# Patient Record
Sex: Female | Born: 1935 | Race: White | Hispanic: No | Smoking: Former smoker
Health system: Southern US, Community
[De-identification: ages and names within clinical notes are randomized; demographics above are authoritative.]

## PROBLEM LIST (undated history)

## (undated) DIAGNOSIS — M1990 Unspecified osteoarthritis, unspecified site: Secondary | ICD-10-CM

## (undated) DIAGNOSIS — I5032 Chronic diastolic (congestive) heart failure: Secondary | ICD-10-CM

## (undated) DIAGNOSIS — E538 Deficiency of other specified B group vitamins: Secondary | ICD-10-CM

## (undated) DIAGNOSIS — J8410 Pulmonary fibrosis, unspecified: Secondary | ICD-10-CM

## (undated) DIAGNOSIS — E0590 Thyrotoxicosis, unspecified without thyrotoxic crisis or storm: Secondary | ICD-10-CM

## (undated) DIAGNOSIS — Z8639 Personal history of other endocrine, nutritional and metabolic disease: Secondary | ICD-10-CM

## (undated) DIAGNOSIS — I2510 Atherosclerotic heart disease of native coronary artery without angina pectoris: Secondary | ICD-10-CM

## (undated) DIAGNOSIS — I10 Essential (primary) hypertension: Secondary | ICD-10-CM

## (undated) DIAGNOSIS — C4491 Basal cell carcinoma of skin, unspecified: Secondary | ICD-10-CM

## (undated) DIAGNOSIS — K635 Polyp of colon: Secondary | ICD-10-CM

## (undated) DIAGNOSIS — I440 Atrioventricular block, first degree: Secondary | ICD-10-CM

## (undated) DIAGNOSIS — J449 Chronic obstructive pulmonary disease, unspecified: Secondary | ICD-10-CM

## (undated) DIAGNOSIS — M8580 Other specified disorders of bone density and structure, unspecified site: Secondary | ICD-10-CM

## (undated) DIAGNOSIS — R0600 Dyspnea, unspecified: Secondary | ICD-10-CM

## (undated) DIAGNOSIS — E785 Hyperlipidemia, unspecified: Secondary | ICD-10-CM

## (undated) DIAGNOSIS — N904 Leukoplakia of vulva: Secondary | ICD-10-CM

## (undated) DIAGNOSIS — I739 Peripheral vascular disease, unspecified: Secondary | ICD-10-CM

## (undated) DIAGNOSIS — I6529 Occlusion and stenosis of unspecified carotid artery: Secondary | ICD-10-CM

## (undated) HISTORY — DX: Atrioventricular block, first degree: I44.0

## (undated) HISTORY — DX: Peripheral vascular disease, unspecified: I73.9

## (undated) HISTORY — DX: Hyperlipidemia, unspecified: E78.5

## (undated) HISTORY — PX: ENDARTERECTOMY: SHX5162

## (undated) HISTORY — DX: Polyp of colon: K63.5

## (undated) HISTORY — DX: Essential (primary) hypertension: I10

## (undated) HISTORY — DX: Thyrotoxicosis, unspecified without thyrotoxic crisis or storm: E05.90

## (undated) HISTORY — DX: Occlusion and stenosis of unspecified carotid artery: I65.29

## (undated) HISTORY — PX: CATARACT EXTRACTION W/ INTRAOCULAR LENS  IMPLANT, BILATERAL: SHX1307

## (undated) HISTORY — DX: Personal history of other endocrine, nutritional and metabolic disease: Z86.39

## (undated) HISTORY — DX: Other specified disorders of bone density and structure, unspecified site: M85.80

## (undated) HISTORY — PX: REPAIR TENDONS FOOT: SUR1209

## (undated) HISTORY — DX: Leukoplakia of vulva: N90.4

## (undated) HISTORY — DX: Atherosclerotic heart disease of native coronary artery without angina pectoris: I25.10

## (undated) HISTORY — DX: Basal cell carcinoma of skin, unspecified: C44.91

## (undated) HISTORY — DX: Deficiency of other specified B group vitamins: E53.8

## (undated) HISTORY — PX: KNEE ARTHROSCOPY: SUR90

## (undated) HISTORY — DX: Chronic diastolic (congestive) heart failure: I50.32

## (undated) HISTORY — DX: Chronic obstructive pulmonary disease, unspecified: J44.9

---

## 1997-11-18 HISTORY — PX: CHOLECYSTECTOMY: SHX55

## 1998-02-18 ENCOUNTER — Encounter (HOSPITAL_COMMUNITY): Admission: RE | Admit: 1998-02-18 | Discharge: 1998-05-19 | Payer: Self-pay | Admitting: Cardiology

## 1998-11-28 ENCOUNTER — Emergency Department (HOSPITAL_COMMUNITY): Admission: EM | Admit: 1998-11-28 | Discharge: 1998-11-28 | Payer: Self-pay | Admitting: *Deleted

## 1998-11-28 ENCOUNTER — Ambulatory Visit (HOSPITAL_COMMUNITY): Admission: RE | Admit: 1998-11-28 | Discharge: 1998-11-28 | Payer: Self-pay | Admitting: *Deleted

## 1998-11-29 ENCOUNTER — Encounter: Payer: Self-pay | Admitting: Emergency Medicine

## 1998-12-01 ENCOUNTER — Other Ambulatory Visit: Admission: RE | Admit: 1998-12-01 | Discharge: 1998-12-01 | Payer: Self-pay | Admitting: *Deleted

## 1999-01-03 ENCOUNTER — Observation Stay (HOSPITAL_COMMUNITY): Admission: RE | Admit: 1999-01-03 | Discharge: 1999-01-04 | Payer: Self-pay | Admitting: *Deleted

## 1999-04-23 ENCOUNTER — Ambulatory Visit (HOSPITAL_COMMUNITY): Admission: RE | Admit: 1999-04-23 | Discharge: 1999-04-23 | Payer: Self-pay | Admitting: Gastroenterology

## 1999-11-30 ENCOUNTER — Ambulatory Visit (HOSPITAL_COMMUNITY): Admission: RE | Admit: 1999-11-30 | Discharge: 1999-11-30 | Payer: Self-pay | Admitting: *Deleted

## 1999-11-30 ENCOUNTER — Encounter: Payer: Self-pay | Admitting: *Deleted

## 2000-01-04 ENCOUNTER — Encounter: Payer: Self-pay | Admitting: Emergency Medicine

## 2000-01-04 ENCOUNTER — Emergency Department (HOSPITAL_COMMUNITY): Admission: EM | Admit: 2000-01-04 | Discharge: 2000-01-04 | Payer: Self-pay | Admitting: Emergency Medicine

## 2000-01-17 ENCOUNTER — Other Ambulatory Visit: Admission: RE | Admit: 2000-01-17 | Discharge: 2000-01-17 | Payer: Self-pay | Admitting: *Deleted

## 2000-11-18 HISTORY — PX: CAROTID ENDARTERECTOMY: SUR193

## 2000-12-01 ENCOUNTER — Encounter: Payer: Self-pay | Admitting: *Deleted

## 2000-12-01 ENCOUNTER — Ambulatory Visit (HOSPITAL_COMMUNITY): Admission: RE | Admit: 2000-12-01 | Discharge: 2000-12-01 | Payer: Self-pay | Admitting: *Deleted

## 2001-01-26 ENCOUNTER — Other Ambulatory Visit: Admission: RE | Admit: 2001-01-26 | Discharge: 2001-01-26 | Payer: Self-pay | Admitting: *Deleted

## 2001-03-16 ENCOUNTER — Inpatient Hospital Stay (HOSPITAL_COMMUNITY): Admission: EM | Admit: 2001-03-16 | Discharge: 2001-03-20 | Payer: Self-pay | Admitting: Emergency Medicine

## 2001-03-16 ENCOUNTER — Encounter: Payer: Self-pay | Admitting: Internal Medicine

## 2001-03-18 ENCOUNTER — Encounter: Payer: Self-pay | Admitting: Internal Medicine

## 2001-03-19 ENCOUNTER — Encounter: Payer: Self-pay | Admitting: Internal Medicine

## 2001-04-08 ENCOUNTER — Inpatient Hospital Stay (HOSPITAL_COMMUNITY): Admission: RE | Admit: 2001-04-08 | Discharge: 2001-04-09 | Payer: Self-pay | Admitting: *Deleted

## 2001-04-08 ENCOUNTER — Encounter (INDEPENDENT_AMBULATORY_CARE_PROVIDER_SITE_OTHER): Payer: Self-pay | Admitting: Specialist

## 2001-12-02 ENCOUNTER — Encounter: Payer: Self-pay | Admitting: *Deleted

## 2001-12-02 ENCOUNTER — Ambulatory Visit (HOSPITAL_COMMUNITY): Admission: RE | Admit: 2001-12-02 | Discharge: 2001-12-02 | Payer: Self-pay | Admitting: *Deleted

## 2002-04-28 ENCOUNTER — Encounter: Payer: Self-pay | Admitting: Emergency Medicine

## 2002-04-29 ENCOUNTER — Inpatient Hospital Stay (HOSPITAL_COMMUNITY): Admission: EM | Admit: 2002-04-29 | Discharge: 2002-04-29 | Payer: Self-pay | Admitting: Emergency Medicine

## 2002-06-04 ENCOUNTER — Encounter: Payer: Self-pay | Admitting: *Deleted

## 2002-06-04 ENCOUNTER — Ambulatory Visit (HOSPITAL_COMMUNITY): Admission: RE | Admit: 2002-06-04 | Discharge: 2002-06-04 | Payer: Self-pay | Admitting: *Deleted

## 2002-07-02 ENCOUNTER — Ambulatory Visit (HOSPITAL_COMMUNITY): Admission: RE | Admit: 2002-07-02 | Discharge: 2002-07-02 | Payer: Self-pay | Admitting: *Deleted

## 2002-07-02 ENCOUNTER — Encounter: Payer: Self-pay | Admitting: *Deleted

## 2002-12-03 ENCOUNTER — Encounter: Payer: Self-pay | Admitting: *Deleted

## 2002-12-03 ENCOUNTER — Ambulatory Visit (HOSPITAL_COMMUNITY): Admission: RE | Admit: 2002-12-03 | Discharge: 2002-12-03 | Payer: Self-pay | Admitting: *Deleted

## 2003-02-14 ENCOUNTER — Ambulatory Visit (HOSPITAL_COMMUNITY): Admission: RE | Admit: 2003-02-14 | Discharge: 2003-02-14 | Payer: Self-pay | Admitting: Gastroenterology

## 2003-02-14 ENCOUNTER — Encounter (INDEPENDENT_AMBULATORY_CARE_PROVIDER_SITE_OTHER): Payer: Self-pay | Admitting: Specialist

## 2003-12-21 ENCOUNTER — Ambulatory Visit (HOSPITAL_COMMUNITY): Admission: RE | Admit: 2003-12-21 | Discharge: 2003-12-21 | Payer: Self-pay | Admitting: Internal Medicine

## 2004-12-24 ENCOUNTER — Ambulatory Visit (HOSPITAL_COMMUNITY): Admission: RE | Admit: 2004-12-24 | Discharge: 2004-12-24 | Payer: Self-pay | Admitting: Internal Medicine

## 2005-09-17 ENCOUNTER — Other Ambulatory Visit: Admission: RE | Admit: 2005-09-17 | Discharge: 2005-09-17 | Payer: Self-pay | Admitting: Gynecology

## 2005-12-26 ENCOUNTER — Ambulatory Visit (HOSPITAL_COMMUNITY): Admission: RE | Admit: 2005-12-26 | Discharge: 2005-12-26 | Payer: Self-pay | Admitting: Internal Medicine

## 2007-01-07 ENCOUNTER — Ambulatory Visit (HOSPITAL_COMMUNITY): Admission: RE | Admit: 2007-01-07 | Discharge: 2007-01-07 | Payer: Self-pay | Admitting: Internal Medicine

## 2007-01-15 ENCOUNTER — Ambulatory Visit: Payer: Self-pay | Admitting: *Deleted

## 2007-10-05 ENCOUNTER — Other Ambulatory Visit: Admission: RE | Admit: 2007-10-05 | Discharge: 2007-10-05 | Payer: Self-pay | Admitting: Gynecology

## 2008-01-12 ENCOUNTER — Ambulatory Visit (HOSPITAL_COMMUNITY): Admission: RE | Admit: 2008-01-12 | Discharge: 2008-01-12 | Payer: Self-pay | Admitting: Internal Medicine

## 2008-01-28 ENCOUNTER — Ambulatory Visit: Payer: Self-pay | Admitting: *Deleted

## 2008-05-27 ENCOUNTER — Emergency Department (HOSPITAL_COMMUNITY): Admission: EM | Admit: 2008-05-27 | Discharge: 2008-05-27 | Payer: Self-pay | Admitting: Emergency Medicine

## 2008-05-31 ENCOUNTER — Inpatient Hospital Stay (HOSPITAL_COMMUNITY): Admission: AD | Admit: 2008-05-31 | Discharge: 2008-06-06 | Payer: Self-pay | Admitting: Internal Medicine

## 2008-06-03 ENCOUNTER — Encounter (INDEPENDENT_AMBULATORY_CARE_PROVIDER_SITE_OTHER): Payer: Self-pay | Admitting: Internal Medicine

## 2008-08-01 ENCOUNTER — Encounter: Payer: Self-pay | Admitting: Cardiology

## 2008-09-13 ENCOUNTER — Encounter: Admission: RE | Admit: 2008-09-13 | Discharge: 2008-09-13 | Payer: Self-pay | Admitting: Internal Medicine

## 2009-01-12 ENCOUNTER — Ambulatory Visit (HOSPITAL_COMMUNITY): Admission: RE | Admit: 2009-01-12 | Discharge: 2009-01-12 | Payer: Self-pay | Admitting: Internal Medicine

## 2009-02-22 ENCOUNTER — Ambulatory Visit: Payer: Self-pay | Admitting: *Deleted

## 2010-01-15 ENCOUNTER — Ambulatory Visit (HOSPITAL_COMMUNITY): Admission: RE | Admit: 2010-01-15 | Discharge: 2010-01-15 | Payer: Self-pay | Admitting: Internal Medicine

## 2010-02-27 ENCOUNTER — Ambulatory Visit: Payer: Self-pay | Admitting: Vascular Surgery

## 2010-07-04 ENCOUNTER — Ambulatory Visit: Payer: Self-pay | Admitting: Cardiology

## 2010-09-14 ENCOUNTER — Ambulatory Visit: Payer: Self-pay | Admitting: Vascular Surgery

## 2011-01-07 ENCOUNTER — Other Ambulatory Visit (HOSPITAL_COMMUNITY): Payer: Self-pay | Admitting: Internal Medicine

## 2011-01-07 DIAGNOSIS — Z1231 Encounter for screening mammogram for malignant neoplasm of breast: Secondary | ICD-10-CM

## 2011-01-11 ENCOUNTER — Ambulatory Visit (INDEPENDENT_AMBULATORY_CARE_PROVIDER_SITE_OTHER): Payer: Medicare Other | Admitting: Cardiology

## 2011-01-11 DIAGNOSIS — E7800 Pure hypercholesterolemia, unspecified: Secondary | ICD-10-CM

## 2011-01-11 DIAGNOSIS — I119 Hypertensive heart disease without heart failure: Secondary | ICD-10-CM

## 2011-01-11 DIAGNOSIS — I2510 Atherosclerotic heart disease of native coronary artery without angina pectoris: Secondary | ICD-10-CM

## 2011-01-22 ENCOUNTER — Ambulatory Visit (HOSPITAL_COMMUNITY)
Admission: RE | Admit: 2011-01-22 | Discharge: 2011-01-22 | Disposition: A | Discharge status: Home / Self Care | Payer: Medicare Other | Source: Ambulatory Visit | Attending: Internal Medicine | Admitting: Internal Medicine

## 2011-01-22 DIAGNOSIS — Z1231 Encounter for screening mammogram for malignant neoplasm of breast: Secondary | ICD-10-CM

## 2011-04-02 NOTE — Consult Note (Signed)
[name], [name]                ACCOUNT NO.:  [id number]   MEDICAL RECORD NO.:  [id number]          PATIENT TYPE:  INP   LOCATION:  3706                         FACILITY:  [facility]   PHYSICIAN:  Peter M. [place]il[place], M.D.  DATE OF BIRTH:  [birth year]   DATE OF CONSULTATION:  DATE OF DISCHARGE:                                 CONSULTATION   HISTORY OF PRESENT ILLNESS:  [name] is a 77 year old white female  who is well-known to me.  She presented to the emergency department on  Friday with complaints of palpitations.  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  Her rhythm in the emergency  department was okay, but the patient was noted to be hypotensive.  Over  the weekend, the patient developed a temperature up to 103 associated  with chills.  On Monday, she developed shortness of breath.  She was  admitted Tuesday with diagnosis of urinary tract infection and severe  hyponatremia.  The patient is currently having active chills.  She  continues to complain shortness of breath, palpitation, and chills.  She  has had no chest pain.  Her chest x-ray on May 31, 2008, showed no  acute change without evidence of CHF.  She had a CT of the chest last  evening, which was negative for acute process or pulmonary embolus.   THE PATIENT'S PAST MEDICAL HISTORY:  1. Coronary artery disease with remote angioplasty of the right      coronary artery x2 in 1995.  Her last Cardiolite study in May 2006,      was negative for ischemia with ejection fraction of 80%.  2. Hyperlipidemia.  3. Hypertension.  4. Peripheral vascular disease with previous subclavian stenosis and      right carotid endarterectomy.  5. Hypothyroidism.  6. Status post cholecystectomy.  7. History of PVCs.  8. Osteopenia.  9. B12 deficiency.  10.Status post cataract surgery.   CURRENT MEDICATIONS:  1. Metoprolol 50 mg b.i.d.  2. Synthroid 135 mcg per day.  3. Lovenox 40 mg subcu daily.  4. Cipro 200  mg IV b.i.d.  5. Protonix 40 mg p.o. daily.   She has no known allergies.   SOCIAL HISTORY:  The patient is a retired Geologist, engineering.  She quit  smoking over 10 years ago.  She rarely drinks alcohol.   FAMILY HISTORY:  Father died at age 73 with myocardial infarction.  Mother died at age 63 with endocarditis.   REVIEW OF SYSTEMS:  She denies any arthralgias or arthritis.  She has  had no rashes.  She denies any cough for hemoptysis.  She denies any  change in bowel or bladder habits recently.  Other review of systems are  negative.   PHYSICAL EXAMINATION:  The patient is an elderly white female who is  actively having chills.  Blood pressure is 94/48, pulse is 108.  She is  in sinus rhythm with occasional PVC.  She is afebrile.  Her HEENT exam,  she is normocephalic and atraumatic.  Her pupils are equal, round, and  reactive.  Sclerae clear.  Oropharynx is clear.  Neck is without JVD,  adenopathy, thyromegaly, or bruits.  Lungs are clear.  Cardiac  exam  reveals a regular rate and rhythm without gallop, murmur, rub, or click.  Abdomen is soft and nontender.  She has no mass or bruits.  There is no  flank tenderness to palpation.  Extremities reveal trace pedal edema.  Neurologic exam is grossly intact.   LABORATORY DATA:  ECG shows normal sinus rhythm.  There are no acute  changes.  Chest x-ray and CT are noted above.  White count 11,800,  hemoglobin 9.5, hematocrit 28.3, and platelets 125,000.  Sodium is 121,  potassium 3.8, chloride 93, CO2 19, BUN 26, creatinine 1.34, glucose  105, total bili is 1.7, albumin 2.3, TSH was 1.294, and calcium is 7.8.  Urine culture shows greater than 10,000 colonies of E coli.   IMPRESSION:  1. Escherichia coli urinary tract infection.  2. Severe hyponatremia.  3. Hypotension with volume depletion.  4. Dyspnea, possibly related to her sepsis/infection.  No evidence of      active congestive heart failure.  5. Coronary artery disease with  remote angioplasty, right coronary.  6. Peripheral vascular disease.   PLAN:  We would hold all antihypertensive at this time.  We will  increase her hydration with normal saline 200 cc/hour, also add fluid  restriction to help with her sodium level.  We will continue IV  antibiotics.  We will obtain an echocardiogram to assess her left  ventricular function and also to rule out vegetation.  If the patient  has any further fever, we would obtain blood cultures.           ______________________________  Peter M. [place], M.D.     PMJ/MEDQ  D:  06/02/2008  T:  06/03/2008  Job:  [id number]   cc:   Larina Earthly, M.D.

## 2011-04-02 NOTE — Procedures (Signed)
CAROTID DUPLEX EXAM   INDICATION:  Followup carotid endarterectomy.   HISTORY:  Diabetes:  No.  Cardiac:  CAD.  Hypertension:  Yes.  Smoking:  Previous.  Previous Surgery:  Right CEA on 03/09/2001.  CV History:  Currently asymptomatic.  Amaurosis Fugax No, Paresthesias No, Hemiparesis No                                       RIGHT             LEFT  Brachial systolic pressure:         133               115  Brachial Doppler waveforms:         Normal            Abnormal  Vertebral direction of flow:        Antegrade         Not visualized  DUPLEX VELOCITIES (cm/sec)  CCA peak systolic                   82                110  ECA peak systolic                   122               124  ICA peak systolic                   103               170  ICA end diastolic                   22                36  PLAQUE MORPHOLOGY:                  Heterogeneous     Heterogeneous  PLAQUE AMOUNT:                      Mild              Moderate  PLAQUE LOCATION:                    ICA / ECA         ICA / ECA   IMPRESSION:  1. Patent right carotid endarterectomy site with no hemodynamically      significant stenosis noted in the right internal carotid artery.  2. Doppler velocity suggests 40%-59% stenosis of the left proximal      internal carotid artery.  3. Unable to adequately visualize the left vertebral artery, however,      a significant difference in the bilateral brachial pressures is      noted.  4. No significant change noted when compared to the previous exam on      02/27/2010.   ___________________________________________  Di Kindle. Edilia Bo, M.D.   CH/MEDQ  D:  09/17/2010  T:  09/17/2010  Job:  [id number]

## 2011-04-02 NOTE — Procedures (Signed)
CAROTID DUPLEX EXAM   INDICATION:  Carotid disease.   HISTORY:  Diabetes:  No.  Cardiac:  CAD, coronary angioplasty in 1995.  Hypertension:  Yes.  Smoking:  Previous.  Previous Surgery:  Right carotid endarterectomy on 03/09/2001.  CV History:  Currently asymptomatic.  Amaurosis Fugax No, Paresthesias No, Hemiparesis No.                                       RIGHT             LEFT  Brachial systolic pressure:         160               108  Brachial Doppler waveforms:         Normal            Abnormal  Vertebral direction of flow:        Antegrade         Not visualized  DUPLEX VELOCITIES (cm/sec)  CCA peak systolic                   95                99  ECA peak systolic                   188               116  ICA peak systolic                   109               135  ICA end diastolic                   25                21  PLAQUE MORPHOLOGY:                  Heterogenous      Mixed  PLAQUE AMOUNT:                      Mild              Moderate  PLAQUE LOCATION:                    ICA/ECA           ICA/ECA   IMPRESSION:  1. Patent right carotid endarterectomy site with a 1% to 39% stenosis      of the right internal carotid artery.  2. 40% to 59% stenosis of the left internal carotid artery.  3. Unable to adequately visualize the left vertebral artery; however,      retrograde collateral flow was noted in this region.  4. A significant difference in the bilateral brachial pressures is      noted.  5. No significant change in Doppler velocities noted when compared to      the previous studies.   ___________________________________________  Quita Skye Hart [place], M.D.   CH/MEDQ  D:  02/27/2010  T:  02/28/2010  Job:  [id number]   cc:   Loraine Leriche A. Perini, M.D.  Peter M. [place], M.D.

## 2011-04-02 NOTE — Procedures (Signed)
CAROTID DUPLEX EXAM   INDICATION:  Followup, carotid artery disease.   HISTORY:  Diabetes:  No.  Cardiac:  Coronary artery disease.  Angioplasty in 1995.  Hypertension:  Yes.  Smoking:  Quit in 1995.  Previous Surgery:  Right carotid endarterectomy with DPA on 04/08/01 by  Dr. Madilyn Fireman.  CV History:  No.  Amaurosis Fugax No, Paresthesias No, Hemiparesis No                                       RIGHT             LEFT  Brachial systolic pressure:         160               134  Brachial Doppler waveforms:         Triphasic         Biphasic  Vertebral direction of flow:        Antegrade         Not detected  DUPLEX VELOCITIES (cm/sec)  CCA peak systolic                   96                76  ECA peak systolic                   142               97  ICA peak systolic                   103               129  ICA end diastolic                   22                26  PLAQUE MORPHOLOGY:                  Calcified         Calcified  PLAQUE AMOUNT:                      Mild-to-moderate  Mild  PLAQUE LOCATION:                    ICA, ECA          ICA, ECA   IMPRESSION:  1. Known left subclavian artery stenosis.  2. Right external carotid artery stenosis.  3. 20-39% bilateral internal carotid artery stenosis, status post      right carotid endarterectomy (upper end of range).  4. Left vertebral artery not detected, probable occlusion.  5. Study essentially unchanged from previous examination of 01/15/07.   ___________________________________________  P. Liliane Bade, M.D.   DP/MEDQ  D:  01/28/2008  T:  01/28/2008  Job:  [id number]

## 2011-04-02 NOTE — Procedures (Signed)
CAROTID DUPLEX EXAM   INDICATION:  Followup evaluation of known carotid artery disease.   HISTORY:  Diabetes:  No.  Cardiac:  Coronary artery disease, coronary angioplasty in 1995.  Hypertension:  Yes.  Smoking:  Quit in 1995.  Previous Surgery:  Right carotid endarterectomy with Dacron patch  angioplasty 03/09/2001 by Dr. Madilyn Fireman.  CV History:  The patient reports no cerebrovascular symptoms at this  time.  Previous duplex on 01/28/2008 revealed left subclavian artery  stenosis, right external carotid artery stenosis, 20-39% internal  carotid artery stenosis bilaterally and an occluded left vertebral  artery.  Amaurosis Fugax No, Paresthesias No, Hemiparesis No                                       RIGHT             LEFT  Brachial systolic pressure:         168               146  Brachial Doppler waveforms:         Triphasic         Triphasic  Vertebral direction of flow:        Antegrade         Occluded  DUPLEX VELOCITIES (cm/sec)  CCA peak systolic                   120               122  ECA peak systolic                   207               231  ICA peak systolic                   130               151  ICA end diastolic                   38                38  PLAQUE MORPHOLOGY:                  Soft              Soft  PLAQUE AMOUNT:                      Mild              Mild  PLAQUE LOCATION:                    Proximal ICA, ECA Proximal ICA, ECA   IMPRESSION:  1. Known left subclavian artery stenosis.  2. 20-39% right ICA stenosis status post endarterectomy.  3. 20-39% left ICA stenosis (high end of range).  4. Bilateral ECA stenosis.  5. No significant change from previous study performed 01/28/2008.   ___________________________________________  P. Liliane Bade, M.D.   MC/MEDQ  D:  02/22/2009  T:  02/22/2009  Job:  [id number]

## 2011-04-02 NOTE — Assessment & Plan Note (Signed)
OFFICE VISIT   [name], [name]  DOB:  08/12/1929                                       02/27/2010  AOZHY#:[id number]   DATE OF SURGERY:  She underwent a right carotid endarterectomy by Dr.  Madilyn Fireman in 2004.   Patient returns to clinic in followup of her carotid occlusive disease.  As previously stated, she underwent a right carotid endarterectomy in  2004.  This was done by Dr. Madilyn Fireman.  She has also been followed by Dr.  Madilyn Fireman in the past for bilateral lower extremity claudication syndrome  symptoms.   Today she presents for evaluation of her carotid artery occlusive  disease.  She states that she has had no amaurosis fugax, paresthesias,  or hemiparesis over the last 6 months.  Her blood pressure and  cholesterol remains stable.   She is widowed.  She has retired.  She has a remote history of tobacco  use.   Her review of systems is significant for claudication with walking.  However, she states that she can walk approximately half a block prior  to claudication symptoms setting in, and they resolve with rest.  She  also had some arthritis, joint and muscle pain.   Physical findings revealed a very pleasant well-nourished woman in no  distress.  Height was 5 feet 6.  Weight was 185 pounds.  Heart rate was  68, blood pressure 128/71, O2 sat was 95%.  HEENT:  PERRLA, EOMI, normal  conjunctiva.  Lungs were clear.  Cardiac exam revealed a regular rate  and rhythm.  I did not appreciate a bruit.  The abdomen was soft,  nontender, nondistended.  Musculoskeletal exam demonstrated no major  deformities.   LABORATORY RESULTS:  She did undergo a duplex of her carotid arteries.  She had a painful right carotid endarterectomy site with a measurable 1%  to 39% narrowing of the right internal carotid artery.  There was a 40%  to 59% narrowing of the left internal carotid artery.  There was no  significant change in the velocities in compared to previous  studies.   Patient continues to be very well following her carotid endarterectomy  by Dr. Madilyn Fireman in 2004.  We will continue surveillance on her carotid  arteries.  As she has not had any studies on her lower extremities since  2009, we will give her a return appointment in 6 months, at which time  we will restudy her carotids and perform ABIs on her lower extremities.   Wilmon Arms, PA   Quita Skye Hart [place], M.D.  Electronically Signed   KEL/MEDQ  D:  02/27/2010  [name]:  02/28/2010  Job:  [id number]   cc:   Peter M. [place], M.D.  Dr. Waynard Edwards

## 2011-04-02 NOTE — H&P (Signed)
[name], [name]                ACCOUNT NO.:  [id number]   MEDICAL RECORD NO.:  [id number]          PATIENT TYPE:  INP   LOCATION:  3706                         FACILITY:  [facility]   PHYSICIAN:  Larina Earthly, M.D.        DATE OF BIRTH:  [birth year]   DATE OF ADMISSION:  05/31/2008  DATE OF DISCHARGE:                              HISTORY & PHYSICAL   CHIEF COMPLAINT:  Nausea, vomiting, and fever and wants to feel  better.   HISTORY OF PRESENT ILLNESS:  This is a 77 year old Caucasian female who  has a remote history of coronary artery disease and angioplasty,  cholecystectomy, hyperthyroidism, hyperlipidemia, peripheral vascular  disease, hypertension, osteopenia, lupus anticoagulant, and B12  deficiency who was seen in the emergency room on April 28, 2008, for  palpations.  Evaluation there revealed sodium of 131 and otherwise  unremarkable BMET.  Cardiac enzymes were unremarkable.  Hemoglobin is  12.2.  Chest x-ray was unremarkable, and the patient was discharged home  with outpatient management plans.  Soon after she developed fevers and  chills with a labile systolic blood pressure ranging from 88-102 and  anorexia.  She was seen in the office on April 30, 2008, with a  temperature of 98.4 degrees, but reported T-max of 103 per the patient  with nausea, but no vomiting and a mild headache.  Exam was unremarkable  except for bruises on the head secondary to a remote fall approximately  2 weeks ago without loss of consciousness when a van door hit her head.  Otherwise, her exam was nonfocal.  Her blood pressure was unremarkable,  but she had held all of her blood pressure medications except for her  beta-blocker secondary to her history of palpitations.  Labs were  obtained and given the benign clinical presentation of the patient, the  patient was discharged to home with plans for close followup assuming  that this was a viral syndrome.  A prescription for Phenergan for  antinausea was  given to the patient.  Labs returned within the next 24  hours revealed a sodium of 120 and a possible urinary tract infection  despite the patient being asymptomatic from the same.  Given the fact  that the patient's sodium had decreased from 131 to 120 within a 48-hour  period of time, the patient was directly admitted to the hospital for  further evaluation, administration of IV antibiotics and antiemetics, as  well as IV fluids.   REVIEW OF SYSTEMS:  As above in the HPI, but negative for focal  neurological deficits, new musculoskeletal deficits, change in bowel  habits with exception of the nausea and dry heaves.  Negative for chest  pain or shortness of breath, but positive for the palpitations reported  on April 28, 2008, and positive for fevers and chills.   PROBLEM LIST:  1. Coronary artery disease with a history of angioplasty in 1995,      history of cholecystectomy in 1999, and history of colon polyps.  2. Hyperthyroidism.  3. Hyperlipidemia.  4. Peripheral vascular disease status post bilateral lower extremity  claudication and subclavian stenosis, as well as carotid bruits and      a history of right carotid endarterectomy.  5. Hypertension.  6. Osteopenia.  7. History of lupus anticoagulant.  8. First-degree atrioventricular block.  9. History of basal cell cancer of the nose.  10.B12 deficiency.  11.Cataracts.   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   FAMILY HISTORY:  Significant for father having died at the age of 74  with myocardial infarction.  Mother having died at the age of 26 of  endocarditis and osteomyelitis, but there is further family history of  coronary artery disease.   CURRENT MEDICATIONS:  1. Maxzide 37.5/25, one p.o. daily.  2. Zocor 40 mg p.o. daily.  3. Altace 10 mg p.o. daily.  4. Metoprolol 75 mg  p.o. b.i.d.  5. Levoxyl 137 mcg p.o. daily.  6. Niaspan 1000 mg p.o. daily.  7. B12 daily.  8. Calcium and vitamin D supplementation and supplementation with      vitamin C.  9. Folic acid.  10.Multivitamin and vitamin D.  11.Aspirin 81 mg p.o. b.i.d.   LABORATORY DATA:  Labs from May 30, 2008, in our office revealed sodium  of 130, potassium 4.9, BUN 26, creatinine 1.3, and glucose 110.  Liver  function test normal.  White blood cell count 11.9, hemoglobin 10.9, and  platelet count 176.  Urinalysis positive for infection.  Labs today  revealed white blood cell count of 13, hemoglobin 10.2, and platelet  count of 113.  Sodium 113, potassium 4.1, BUN 34, creatinine 1.77, and  glucose 80.  Liver function tests normal with exception of a total bili  of 1.7, albumin 2.3, and calcium 7.9.  Urinalysis positive for  infection.  Urine culture pending.   EKG pending.   PHYSICAL EXAMINATION:  GENERAL: We have a moderately obese Caucasian  female, lying flat in bed with mild malaise and old bruises in the  periorbital region around her right greater than left eyes.  VITAL SIGNS: Blood pressure 100/50, pulse 80s and regular, respirations  18 and nonlabored, temperature 98.7 degrees Fahrenheit, and oxygen  saturation 95% on room air.  HEENT:  Sclerae anicteric.  Extraocular movements were intact.  Face is  symmetric.  There is no oropharyngeal lesions.  Tongue is midline.  NECK:  Supple.  There is no cervical lymphadenopathy.  LUNGS:  Clear to auscultation bilaterally.  CARDIOVASCULAR:  Regular rate and rhythm.  ABDOMEN:  Soft, nontender, and nondistended abdomen.  Bowel sounds were  present.  There is no edema.  Pedal pulses are intact.  The patient is  alert and oriented.  NEUROLOGIC:  Grossly nonfocal.   ASSESSMENT AND PLAN:  1. Severe hyponatremia unclear etiology, but certainly sodium has      decreased rapidly from 131 to 113 in a 72-hour period of time.      Contributing factors  include recent anorexia with nausea and      vomiting and volume depletion.  Head trauma approximately 2 weeks      ago.  Our plan at this time will be to check a TSH and chest x-ray.      There is certainly no evidence of heart failure, acute renal      failure, cirrhosis, or on by physical exam or review of systems,      certainly there is no  evidence of seizure activity at this time and      given the fact that her hyponatremia is relatively asymptomatic      with the exception of her nausea and headache, we will give gentle      normal saline intravenously and recheck a BMET which is pending at      this time and continue to do so throughout the night to assess a      rate of correction.  2. Urinary tract infection.  We will start Cipro and follow up on      culture and sensitivity.  3. Headache with nausea with given recent history of head trauma, we      will check a head CT to rule out bleed as a possible contributing      factor to her headaches as well as electrolyte abnormalities.  4. Cardiovascular.  Currently, appears hemodynamically stable, albeit      with a labile blood pressure and low systolic blood pressure.  If      she does become acutely unstable, we will enlist the consult of Dr.      Peter Sw[place]iland for assistance.      Larina Earthly, M.D.  Electronically Signed     RA/MEDQ  D:  05/31/2008  T:  06/01/2008  Job:  [id number]   cc:   Peter M. [place], M.D.

## 2011-04-02 NOTE — Procedures (Signed)
LOWER EXTREMITY ARTERIAL EVALUATION-SINGLE LEVEL   INDICATION:  Followup, peripheral vascular disease.   HISTORY:  Diabetes:  No.  Cardiac:  Coronary artery disease.  Angioplasty in 1995.  Hypertension:  Yes.  Smoking:  Quit in 1995.  Previous Surgery:  No.   RESTING SYSTOLIC PRESSURES: (ABI)                          RIGHT                LEFT  Brachial:               160                  134  Anterior tibial:        80                   70  Posterior tibial:       86 (0.54)            96 (0.60)  Peroneal:  DOPPLER WAVEFORM ANALYSIS:  Anterior tibial:        Monophasic           Monophasic  Posterior tibial:       Monophasic           Monophasic  Peroneal:   PREVIOUS ABI'S:  Date: 02/19/01  RIGHT:  0.52  LEFT:  0.63   IMPRESSION:  Ankle brachial indices are stable bilaterally from previous  examination.   ___________________________________________  P. Liliane Bade, M.D.   DP/MEDQ  D:  01/28/2008  T:  01/28/2008  Job:  [id number]

## 2011-04-05 NOTE — Discharge Summary (Signed)
[place]. [facility]  Patient:    [name], [name]                         MRN: [id number] Adm. Date:  [id number] Disc. Date: [id number] Attending:  Rodrigo Ran A                           Discharge Summary  DISCHARGE DIAGNOSES:  1. Severe stenosis of the right internal carotid artery and the left     subclavian artery.  2. Diffuse atherosclerotic disease.  3. History of coronary artery disease.  4. Hypothyroidism.  5. Hypercholesterolemia.  6. Peripheral vascular disease.  7. Postmenopausal status.  8. Hypertension.  9. Osteopenia. 10. Elevated activated partial thromboplastin time of unclear etiology,     possibly due to an acquired factor inhibitor. 11. Vertigo on presentation of unclear etiology, possibly due to inner ear     cause.  DISCHARGE MEDICATIONS:  Include same medicines as on admission.  1. Lopressor 50 mg b.i.d.  2. ______ 1.137 mcg q.d.  3. Lopid 600 mg b.i.d.  4. Prempro 0.625/2.5 q.d.  5. Aspirin 81 mg b.i.d.  6. Centrum silver q.d.  7. Vitamin C q.d.  8. Coenzyme q b.i.d.  9. Altace 10 mg q.d. 10. Caltrate with D.  PROCEDURES:  1. MRI of the brain as well as intracranial MRA.  2. Cerebral angiogram.  HISTORY OF PRESENT ILLNESS:  The patient is a 77 year old female with diffuse atherosclerotic disease.  She presented to the office with several episodes of vertigo and visual disturbance.  She subsequently is admitted for further workup.  HOSPITAL COURSE:  The patient was admitted and placed on aspirin and Lovenox initially.  Subsequently, her symptoms did improve.  Workup included carotid Doppler studies showing a greater than 90% right ICS stenosis and a 40-60% left ICA stenosis.  MRI and MRA of the head showed a small vessel disease, no acute stroke, but did confirm a right ICA stenosis and possible aneurysm at the top of the basilar artery.  Due to these findings, neurosurgery and neurology were consulted.  Subsequently, the  patient had a cerebral angiogram which did confirm an 85% right ICA stenosis and an 80% left subclavian stenosis.  There was no aneurysm noted in the basilar artery.  Subsequently, neurosurgery and neurology were able to sign off.  CVTS was consulted for atherosclerotic disease.  She was planned to have elective endarterectomy, however, due to the fact that she had an elevated activated PTT on admission, a hematology workup was undertaken.  She had a mixing study which was inconclusive.  Therefore, the surgery was put on hold until a coagulation workup could be completed.  The patient was discharged in stable condition on aspirin only on Mar 20, 2001, as well as her home medicines.  DISCHARGE INSTRUCTIONS:  The patient is to follow up with Dr. Truett Perna to complete her coagulation workup.  She will subsequently schedule elective vascular surgery with Dr. Madilyn Fireman.  She is to call if she has any recurrent problems or symptoms.  She is to follow up routinely with Dr. Waynard Edwards. DD:  03/30/01 TD:  03/31/01 Job: [id number]

## 2011-04-05 NOTE — Discharge Summary (Signed)
[place]. [facility]  Patient:    [name], [name]                         MRN: [id number] Adm. Date:  [id number] Disc. Date: [id number] Attending:  Melvenia Needles Dictator:   Sherrie George, P.A. CC:         Rodrigo Ran, M.D.  Tera Mater. Evlyn Kanner, M.D.   Discharge Summary  DATE OF BIRTH:  Nov 02, 1929  ADMISSION DIAGNOSES: 1. Vertigo with 80 to 85% right internal carotid artery stenosis. 2. Left subclavian stenosis. 3. Coronary artery disease. 4. Hypertension. 5. Hypothyroidism. 6. Hypercholesterolemia.  DISCHARGE DIAGNOSES: 1. Vertigo with 80 to 85% right internal carotid artery stenosis. 2. Left subclavian stenosis. 3. Coronary artery disease. 4. Hypertension. 5. Hypothyroidism. 6. Hypercholesterolemia.  PROCEDURE:  Right carotid endarterectomy with Dacron patch angioplasty, Apr 08, 2001, by Dr. Madilyn Fireman.  BRIEF HISTORY:  The patient is a 77 year old white female medical patient of Rodrigo Ran, M.D. and Tera Mater. Evlyn Kanner, M.D., who was referred to CVTS and Dr. Madilyn Fireman for evaluation.  The patient was admitted with episodes of vertigo lasting four to six hours.  Doppler studies were obtained which showed severe right ICA stenosis and a left subclavian stenosis.  An MRI and MR angiogram revealed no evidence of acute cerebrovascular event.  There was, however, a right internal carotid artery stenosis and left carotid subclavian as noted by Doppler.  After complete evaluation of this, it was Dr. Madilyn Fireman opinion that the patient should be admitted for elective right carotid endarterectomy.  The risks and benefits were discussed in detail.  Informed operative consent was obtained.  PAST MEDICAL HISTORY:  As noted above.  MEDICATIONS: 1. Protonix 40 mg q.d. 2. Aspirin 325 mg one q.d. 3. Lopressor 50 mg p.o. b.i.d. 4. Lopid 600 mg p.o. b.i.d. 5. Altace 10 mg p.o. q.d. 6. Synthroid 0.137 mg q.d. 7. Prempro 0.625/2.5 mg q.d.  (the patient reports  this was discontinued    by Dr. Waynard Edwards).  ALLERGIES:  No known drug allergies.  For further history and physical, please see the dictated note.  The patient smoked at least a pack a day for 40+ years.  HOSPITAL COURSE:  The patient was admitted, taken to the operating room, and underwent right carotid endarterectomy.  She tolerated the procedure well and returned to the recovery room in satisfactory condition.  Neurologically she was fine postoperatively, but was somewhat hypotensive and was placed on low dose dopamine drip.  This was quickly weaned off.  She was transferred to the intensive care unit for observation and further treatment.  Overnight her blood pressure remained stable off the dopamine.  She remained neurologically intact.  The first postoperative morning, she was mobilized.  She was started back on her Lopressor and orthostatic pressures were fine.  She was weaned off her oxygen.  Her diet was advanced and she was ambulated in the hall.  By the afternoon, she was doing quite well and was subsequently discharged home. She is scheduled for follow-up with Dr. Madilyn Fireman on Monday, June 3, at 8:50 a.m.  She is going to resume all of her preadmission medicines as before.  She is also given Tylox one to two p.o. q.4h. p.r.n.  ACTIVITY:  Light to moderate, no lifting over 10 pounds.  No driving or strenuous activity.  Postoperative labs showed a hemoglobin of 9.2, hematocrit 27, white count 7000, platelets of 227,000.  Electrolytes were normal.  BUN  10, creatinine 0.8, glucose 113, calcium 8.3.  CONDITION ON DISCHARGE:  Improving. DD:  04/09/01 TD:  04/09/01 Job: [id number]

## 2011-04-05 NOTE — Consult Note (Signed)
[place]. [facility]  Patient:    Kelly, [name]                         MRN: [id number] Proc. Date: 03/19/01 Adm. Date:  [id number] Disc. Date: [id number] Attending:  Rodrigo Ran A Dictator:   Lorette Ang, N.P. CC:         Rodrigo Ran, M.D.  Tera Mater. Evlyn Kanner, M.D.  Hewitt Shorts, M.D.  Catherine A. Orlin Hilding, M.D.  Denman George, M.D.   Consultation Report  DATE OF BIRTH:  1929/08/01  REASON FOR CONSULTATION:  Elevated PTT.  REFERRING PHYSICIAN:  Tera Mater. Evlyn Kanner, M.D.  HISTORY OF PRESENT ILLNESS:  [name] is a 77 year old woman with a history of atherosclerotic CAD status post PTCA February 1995 and redo PTCA July 1995, hypertension, peripheral vascular disease, hypothyroidism, hypercholesterolemia, who was admitted to [facility] on March 16, 2001, for evaluation of complaints of dizziness.  Her lab work upon admission revealed an elevated PTT at 58 and a normal PT.  She was started on Lovenox injections after her lab work was drawn.  A PTT mixing study on Mar 18, 2001, was inconclusive with an inhibitor not excluded.  The patient is currently receiving intravenous heparin.  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  She underwent an arteriogram on March 16, 2001, which was negative for an aneurysm but did identify right ICA stenosis and left subclavian stenosis.  She was scheduled to have a right carotid endarterectomy on Mar 20, 2001.  A hematology consult was requested for further evaluation of the elevated PTT prior to undergoing the carotid endarterectomy.  [name] denies any knowledge of an elevated PTT in the past.  She reports multiple dental extractions in the past as well as a cholecystectomy in February 2000 with no bleeding complications.  She denies any bleeding after the arteriogram.  She has no family history of hematologic disorder.  PAST MEDICAL  HISTORY: 1. ASCAD status post PTCA February 1995, July 1995. 2. Hypertension. 3. Peripheral vascular disease. 4. Hypothyroidism. 5. Hypercholesterolemia. 6. History of colon polyps. 7. History of elevated PTT July 1995.  PAST SURGICAL HISTORY: 1. Status post cholecystectomy February 2000. 2. Status post cataract removal December 2001. 3. Status post multiple dental extractions.  MEDICATIONS: 1. Aspirin 325 mg daily. 2. Protonix 40 mg daily. 3. Lopressor 50 mg twice daily. 4. Lopid 600 mg twice daily. 5. Altace 10 mg daily. 6. Synthroid 137 mcg daily. 7. Heparin drip.  ALLERGIES:  No known drug allergies.  FAMILY HISTORY:  Mother deceased at age 58 with a Staph infection; father deceased at age 78 with an MI; sister deceased at age 30 with TTP; brother living with history of CAD status post CABG.  There is no family history of a bleeding disorder.  SOCIAL HISTORY:  [name] lives in [place].  She is a widow.  She has three children who are all reported to be healthy.  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  She does have a positive tobacco history, reporting that she quit smoking in 1995 at one to two packs per day for 30 years.  She denies any ETOH use.  REVIEW OF SYSTEMS:  The patient reports an intentional 20-pound weight loss since February 2002.  She denies any anorexia.  She has had no fatigue.  She denies any pain.  She  has had no fever.  She denies any bleeding or increasing bruising.  She has had no gum bleeding and no epistaxis.  She denies any headaches or vision changes.  She has noted no enlarged lymph nodes.  She has had no shortness of breath or cough.  She denies any chest pain or peripheral edema.  She has had no recent change in her bowel habits and denies any rectal bleeding or melena.  She has had no nausea, vomiting, or dysphagia.  She denies any hematuria or dysuria.  She does report dizziness prior to admission but states that she  has had none since that time.  PHYSICAL EXAMINATION:  VITAL SIGNS:  Temperature 98.2, heart rate 80, respirations 20, blood pressure 125/80, oxygen saturation 97% on room air.  GENERAL:  Well-nourished Caucasian female in no acute distress.  HEENT:  Normocephalic, atraumatic.  Pupils are equal, round and reactive to light.  No oral lesions or mucosal bleeding.  LUNGS:  Clear to auscultation bilaterally.  No wheezes or rales.  CARDIOVASCULAR:  Regular rate and rhythm.  ABDOMEN:  Soft and nontender.  EXTREMITIES:  No edema.  Groin angiogram site without bleeding and without hematoma.  NEUROLOGIC:  Alert and oriented x 3, follows commands.  Motor strength 5/5. Gait steady.  LABORATORY DATA:  Hemoglobin 11.3, white count 6, platelets 318,000.  PTT 57, PT/INR 15.0/1.3.  Admission labs on March 16, 2001: PTT 58 (baseline), PT/INR 14.3/1.2.  Mixing study on Mar 18, 2001: Inhibitor not excluded.  Prior labs:  June 1997 on admission for cardiac catheterization, PTT 39. Admission July 1995 for PTCA, baseline PTT 38.  Mixing study June 08, 1994, inhibitor no excluded.  IMPRESSION/PLAN:  [name] is a 77 year old woman who was admitted to [facility] with a "dizzy spell" with extensive diagnostic workup that has revealed a right internal carotid stenosis.  She has been scheduled for an endarterectomy.  On admission, she was noted to have an elevated PTT.  This has persisted, and a mixing study suggests possibility of an inhibitor.  A review of her records indicates an elevated PTT dating back to at least 1995.  A mixing study at that time gave similar results to those of the mixing study this admission.   She has undergone numerous surgeries and procedures in the past without bleeding problems.  This has included a recent cholecystectomy, dental extractions, and an angiogram during this admission.  We think the chance for an inherited or acquired (e.g. factor  inhibitor) bleeding disorder is very low in her case.  We suspect that she has a "lupus anticoagulant" that interferes with the PTT assay.  Despite the very low risk of bleeding, she feels more comfortable with defining the cause of the elevated PTT prior to undergoing the endarterectomy. Dr. Truett Perna discussed the above with Dr. Madilyn Fireman with the decision made for surgery to be cancelled at this time.  We will draw labs on [name] in the office on Mar 20, 2001, to workup the elevated PTT and then see her in the office next week.  The patient is seen and examined by Dr. Truett Perna. DD:  03/20/01 TD:  03/21/01 Job: [id number]

## 2011-04-05 NOTE — Discharge Summary (Signed)
[name], [name]                ACCOUNT NO.:  [id number]   MEDICAL RECORD NO.:  [id number]          PATIENT TYPE:  INP   LOCATION:  3706                         FACILITY:  [facility]   PHYSICIAN:  Mark A. Perini, M.D.   DATE OF BIRTH:  1929/01/17   DATE OF ADMISSION:  05/31/2008  DATE OF DISCHARGE:  06/06/2008                               DISCHARGE SUMMARY   DISCHARGE DIAGNOSES:  1. Pyelonephritis with Escherichia coli.  2. Early sepsis.  3. Hypotension.  4. Severe hyponatremia, improved at the time of discharge.  5. Dyspnea.  6. History of coronary artery disease.  7. Peripheral vascular disease.  8. Hypothyroidism.  9. History of a coagulopathy.   PROCEDURES:  1. Cardiology consultation, 2-D echocardiogram on June 03, 2008,      showing normal left ventricular systolic function with an ejection      fraction of 55-60% with no regional wall motion abnormalities noted      at the left ventricle.  Aortic valve thickness was mildly      increased, mild aortic valve stenosis with a mean transaortic valve      gradient of 9 mmHg was noted.  2. CT scan of the abdomen and pelvis on June 03, 2008, showed      bilateral acute pyelonephritis, small calcified uterine fibroids,      no acute pelvic findings.  There was a small nonobstructive right      renal calculus.  On June 02, 2008, the patient had a CT angiogram      of the chest, which showed atherosclerosis.  No evidence of a      pulmonary embolus, remote granulomatous disease of the right lung,      and a 3-mm right upper lobe nodule that does not require additional      followup.   DISCHARGE MEDICATIONS:  1. She is to stop Maxzide.  2. She may resume Zocor 40 mg each evening and she is to hold ramipril      and check her blood pressure daily and report this to [place] in the      next week.  3. She is to take metoprolol one-half of a 50 mg pill twice daily.  4. She is to resume Levoxyl 137 mcg daily.  5. She is to hold Niaspan  for 2 weeks and then resume it.  6. She may resume her B12 supplement and she is to hold her calcium      for now.  7. She may take vitamin D 1000 units daily.  8. She may resume folic acid and her multivitamin.  9. She may resume aspirin 81 mg 2 pills daily.  10.She is to take Cipro 500 mg twice daily for 8 further days.   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  She was seen in the emergency  room on April 28, 2008, for palpitations.  At that time, her sodium was  131.  Cardiac enzymes were unremarkable.  She was seen in the office on  May 30, 2008, with a temperature of 98.4, reported a maximum  temperature of 103 with nausea and no vomiting.  Exam was unremarkable.  She had a remote fall 2 weeks prior to admission without loss of  consciousness.  Blood pressure was unremarkable.  She returned 24 hours  later with a sodium of 120 and a possible urinary tract infection  despite being asymptomatic.  She was admitted to the hospital for  treatment.   HOSPITAL COURSE:  [name] was admitted to a telemetry bed.  X-ray studies  were performed as noted above.  She was placed on treatment with Cipro  intravenously.  She was given normal saline for volume repletion and for  her hyponatremia.  Cardiology was consulted, but felt that all of her  problems were due to her pyelonephritis.  Echocardiogram was performed  with results as noted above.  She remained stable from a pulmonary and  cardiovascular standpoint during her stay.  She gradually improved, and  on June 06, 2008, she was deemed stable for discharge home.   DISCHARGE PHYSICAL EXAM:  Temperature 98.1, afebrile; pulse 84;  respiratory rate 18; blood pressure 135/54; 95% saturation on room air.  She is in no acute distress.  Lungs are clear to auscultation  bilaterally with no wheezes, rales, or rhonchi.  Heart was regular rate  and rhythm with no murmur, rub, or  gallop.  Abdomen was soft, nontender,  and nondistended with no mass or hepatosplenomegaly.  There was no  edema.   DISCHARGE LABORATORY DATA:  Blood cultures x2 were negative on June 04, 2008.  Sodium was 135, potassium 3.9, chloride 108, CO2 20, BUN 12,  creatinine 0.93, glucose 103, calcium 8.1, and GFR was 59.  White count  7.7, hemoglobin 9.3, platelet count 214,000, and MCV was 79.9.  On June 03, 2008, liver function tests were normal with the exception of an  albumin of 2.1.  BNP was 397 on June 02, 2008.  Urine culture on May 31, 2008, had greater than 100,000 colonies of E. Coli, which was  sensitive to Cipro.  TSH was 1.3 on June 01, 2008.   DISCHARGE INSTRUCTIONS:  [name] is to follow a low-salt diet.  She is to  increase her activity slowly.  She is to call if she has any recurrent  problems.  She is to follow up with Dr. Waynard Edwards in 1-2 weeks and we will  set up a Urology followup due to her episode of significant  pyelonephritis.  She will follow up with Cardiology as per their  instruction.      Mark A. Perini, M.D.  Electronically Signed     MAP/MEDQ  D:  06/09/2008  T:  06/10/2008  Job:  [id number]

## 2011-04-05 NOTE — Op Note (Signed)
NAME:  [name], [name]                          ACCOUNT NO.:  [id number]   MEDICAL RECORD NO.:  [id number]                   PATIENT TYPE:  AMB   LOCATION:  ENDO                                 FACILITY:  [facility]   PHYSICIAN:  Petra Kuba, M.D.                 DATE OF BIRTH:  1929/07/23   DATE OF PROCEDURE:  DATE OF DISCHARGE:                                 OPERATIVE REPORT   PROCEDURE:  Colonoscopy.   INDICATIONS:  Patient with history of colon polyps, due for repeat  screening.  Consent was signed after risks, benefits, methods, options  thoroughly discussed in the past.   PREMEDICATIONS:  Demerol 50 mg, Versed 5 mg.   DESCRIPTION OF PROCEDURE:  Rectal inspection pertinent for external  hemorrhoids.  Digital exam was negative.  Pediatric video adjustable  colonoscope was inserted and fairly easily advanced around the colon to the  cecum.  This did required some abdominal pressure but no position changes.  On insertion, no obvious abnormality was seen.  The cecum was identified by  the appendiceal orifice and the ileocecal valve.  Prep was adequate.  There  was some liquid stool that required washing and suction.  On slow withdrawal  through the colon, the cecum and ascending were normal.  At the hepatic  flexure, a tiny polyp was seen and was hot biopsied x 1.  In the mid  transverse an even smaller, probably hyperplastic appearing polyp was seen  and was cold biopsied x 1.  The scope was further withdrawn around the left  side of the colon..  There was some rare early left-sided diverticula and a  mid sigmoid tiny polyp which was hot biopsied x 1 and put in the container  with the other polyps.  The scope was slowly withdrawn back to the rectum.  No additional findings were seen.  Once back in the rectum, the scope was  retroflexed revealing some internal hemorrhoids.  The scope was straightened  and readvanced a short ways up the left side of the colon.  Air was  suctioned  and the scope removed.  The patient tolerated the procedure well.  There were no obvious immediate complications.   ENDOSCOPIC DIAGNOSES:  1. Internal and external hemorrhoids.  2. Rare left-sided early diverticula.  3. Three tiny polyps, two hot biopsied in the sigmoid and hepatic flexure,     one cold biopsied in the transverse.  4. Otherwise within normal limits to the cecum.    PLAN:  Await pathology.  Probably recheck colon screening in five years if  doing well medically.  I will be happy to see back p.r.n.; otherwise return  to the care of Dr. Waynard Edwards for the customary health care management including  yearly rectals and guaiacs.  Petra Kuba, M.D.    MEM/MEDQ  D:  02/14/2003  T:  02/14/2003  Job:  [id number]   cc:   Loraine Leriche A. Waynard Edwards, M.D.  [address]  [place]  [place] [address]  Fax: [phone]

## 2011-04-05 NOTE — H&P (Signed)
[place]. [facility]  Patient:    [name], [name]                         MRN: [id number] Adm. Date:  [id number] Attending:  Sela Hua CC:         Peter M. Swaziland, M.D.   History and Physical  PRIMARY CARE PHYSICIAN:  Rodrigo Ran, M.D.  CHIEF COMPLAINT:  Dizziness and light-headedness.  HISTORY OF PRESENT ILLNESS:  Ms. Estorga is a pleasant 77 year old female with a past history significant for diffuse generalized atherosclerotic disease with a history of coronary artery disease status post percutaneous coronary intervention in 1995 to one vessel and in addition, she has bilateral lower extremity claudication with reduced ankle-brachial indexes bilaterally, as well as having a right carotid and abdominal bruits.  She also has a history of hypothyroidism, hypercholesterolemia, and hypertension.   Ms. Szymborski was in her usual state of health until approximately three days prior to admission when at 2-3 p.m., she was driving in her car when she had the acute onset of feeling as though things were somewhat off.  She denies any definite spinning vertigo, but her vision was in some ways off.  She did not have any definite diplopia, light-headedness, amaurosis fugax, dysarthria or dysphagia or any specific focal neurologic weakness.  However, she felt as though her vision and her thinking were some how off.  This persisted for approximately three hours.  At first, she had thought it was from some hypoglycemia and she attempted to eat.  Although, this did not change her symptoms.  Subsequently, she went to do water exercises and the symptom went away.  However, one day prior to admission, she had recurrence of symptoms at approximately 3 p.m. Again, she was driving in her car and she had the exact same sensation of her vision and her perception of things being off.  She had no other definite symptoms, but the symptom has persisted until the current time.   She also reported some feeling of her heart fluttering and her feeling of her heart rate being off in her chest.  She denies any definite tachycardia or bradycardic symptoms.  She has noted that if she lies flat, she may have some spinning sensation.  Furthermore, she almost fell over once today due to these symptoms.  PAST MEDICAL HISTORY: 1. Atherosclerotic coronary artery disease, status post one vessel PCI in July    1995. 2. Hypothyroidism. 3. Hypercholesterolemia. 4. Peripheral vascular disease with bilateral lower extremity claudication    with a right ankle-brachial index of 0.52 and a left ankle-brachial index    of 0.63.  Furthermore, she has a known left subclavian artery stenosis. 5. Postmenopausal. 6. Hypertension. 7. Osteopenia. 8. History of cholecystectomy in 1999 and history of colon polyps.    Furthermore, she may also have a lower extremity neuropathy.  ALLERGIES:  None.  CURRENT MEDICATIONS:  Lopressor 50 mg b.i.d., Levoxyl 0.137 mg q.d., Loped 600 mg b.i.d., Prempro 0.625/2.5 x the last four years.  Aspirin 81 mg b.i.d., Centrum Silver q.d., vitamin C q.d., coenzyme Q b.i.d., Altace 10 mg q.d. and Caltrate with D daily.  SOCIAL HISTORY:  Ms. Surprenant has been widowed since 16.  She has three children, two girls and a son.  She has seven grandchildren.  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  She has a 50 pack/year smoking history, but  quit in 1995.  She has rare alcohol use and no drug use.  FAMILY HISTORY:  Father died at age 27 of a MI.  Mother died at age 22 of osteomyelitis and endocarditis.  She has one brother, age 48, who has had 4-vessel bypass and she has one sister, who died at age 65 of kidney failure.  PHYSICAL EXAMINATION:  VITAL SIGNS:  Pulse 68, blood pressure 170/80.  She is in no acute distress. Cranial nerves two through 12 are intact.  She alert and oriented x 4.  HEENT:   Otherwise normal.  CHEST:  Clear to auscultation bilaterally.  HEART:  Regular with a 1/6 murmur at the left sternal border that does not radiate.  She does have a right carotid bruit versus transmitted murmur.  EXTREMITIES:  There is no peripheral edema.  Strength and deep tendon reflexes are full throughout with 2+ DTRs throughout.  ABDOMINAL EXAMINATION:  Benign.  There is no lymphadenopathy.  NEUROLOGIC:  Judgement and insight are normal, as are her mood and affect.  LABORATORY DATA:  Pending.  ASSESSMENT/PLAN: 1. Visual disturbance and mental status disturbance x 2 episodes now with    ongoing symptoms.  There are no other focal neurologic findings.  However,    with her history of severe diffuse atherosclerotic disease, we will admit    and work-up for possible vertebrobasilar insufficiency.  We will obtain    carotid studies, as well as an echocardiogram and a MRI with an    intracranial MRA.  We will treat with Lovenox initially and continue her on    her aspirin.  We may need a neurology consult to help with further    evaluation. 2. Will continue other medications including her thyroid medication.  I do    think that Ms. McGehees hormone replacement therapy should likely be    discontinued given her underlying history. 3. Hypertension - continue home medicines and monitor.   Patient is a    full-code status. DD:  03/16/01 TD:  03/16/01 Job: [id number]

## 2011-04-05 NOTE — Op Note (Signed)
[place]. [facility]  Patient:    [name], [name]                         MRN: [id number] Proc. Date: 04/08/01 Adm. Date:  [id number] Attending:  Melvenia Needles CC:         Rodrigo Ran, M.D.   Operative Report  PREOPERATIVE DIAGNOSIS:  Severe right internal carotid artery stenosis.  POSTOPERATIVE DIAGNOSIS:  Severe right internal carotid artery stenosis.  PROCEDURE:  Right carotid endarterectomy and Dacron patch angioplasty.  SURGEON:  Denman George, M.D.  ASSISTANT:  Maxwell Marion, R.N.F.A.  ANESTHESIA:  General endotracheal.  ANESTHESIOLOGIST:  Janetta Hora. Gelene Mink, M.D.  CLINICAL NOTE:  This is a 77 year old female who was recently admitted to [facility] following a presyncopal event.  She was found to have a severe right internal carotid artery stenosis.  Seen in consultation, it was recommended that the patient undergo right carotid endarterectomy for reduction of stroke risk.  The patient consented for surgery.  Risks and benefits of this procedure explained in detail, including the potential risk of MI, CVA, and death of approximately 1-2%.  DESCRIPTION OF PROCEDURE:  Patient brought to the operating room in stable condition.  Placed in the supine position under general endotracheal anesthesia.  Foley catheter, arterial line inserted.  The right neck prepped and draped in a sterile fashion.  Curvilinear skin incision made along the anterior border of the right sternomastoid muscle.  Subcutaneous tissue and platysma divided with electrocautery.  Deep dissection carried down along the anterior border of the sternomastoid to the carotid sheath.  The common carotid artery mobilized proximally and encircled with a vessel loop.  Distal dissection carried up to the carotid bifurcation.  The superior thyroid and external carotid were encircled with a fine vessel loop.   The internal carotid artery followed distally.  The  hypoglossal nerve was reflected superiorly.  The distal internal carotid artery encircled with a vessel loop.  The patient administered 7000 units of heparin intravenously.  Adequate circulation time permitted.  Carotid vessels controlled with clamps.  A longitudinal arteriotomy made in the distal common carotid artery.  The arteriotomy extended across the carotid bulb and up into the internal carotid artery.  There was a tight stenosis at the origin of the right internal carotid artery with calcified plaque, estimated to be greater than 80%.  A shunt was inserted.  An endarterectomy elevator used to remove the plaque.  The plaque raised down into the common carotid artery was divided transversely with Potts scissors. The superior thyroid and external carotid were endarterectomized using an eversion technique.  The plaque then feathered out of the internal carotid artery.  Remnants of plaque were removed with fine forceps.  The site irrigated with heparin and saline solution.  A preclotted knitted Dacron patch was placed over the endarterectomy site with running 6-0 Prolene suture.  The shunt was then removed.  All vessels well-flushed.  Clamps were removed, directing the initial antegrade flow up the external carotid artery.  Following this, the internal carotid was released.  There was an excellent pulse and Doppler signal in the distal internal carotid artery.  The patient administered 50 mg of protamine intravenously.  Adequate hemostasis obtained.  The sponge and instrument counts were correct.  The sternomastoid fascia closed with running 2-0 Vicryl suture.  Platysma closed with running 3-0 Vicryl suture.  Skin closed with 4-0 Monocryl. Half-inch Steri-Strips applied.  Anesthesia was  reversed in the operating room.  Patient awakened readily, moved all extremities to command.  Transferred to the recovery room in stable condition. DD:  04/08/01 TD:  04/09/01 Job:  [id number]

## 2011-04-05 NOTE — Consult Note (Signed)
[place]. [facility]  Patient:    [name], [name]                         MRN: [id number] Proc. Date: 03/17/01 Adm. Date:  [id number] Attending:  Ezequiel Kayser CC:         [name], M.D.   Consultation Report  IMPRESSION: 1. Dizziness, resolved, nonspecific.  It could have been labyrinthitis, doubt    vertebrobasilar insufficiency, cannot rule out cardiovascular causes. 2. Asymptomatic 80% right carotid stenosis. 3. 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  RECOMMENDATIONS:  Agree she will need conventional angiogram to evaluate carotid stenosis, potential basilar aneurysm.  We will probably need CVTS consult regarding the internal carotid artery stenosis.  A follow-up on completion of the angiogram.  CHIEF COMPLAINT:  Lightheadedness.  HISTORY OF PRESENT ILLNESS:  [name] is a 77 year old woman with a history of coronary artery disease and peripheral vascular disease but otherwise in generally quite good health.  On Friday, about five days prior to admission, she had an episode that she describes as being dizzy or lightheaded, which is fairly mild.  She tries to explain it by giving an example that if she fixates on something in her vision centrally she has a sensation of movement of the periphery as though it is spinning.  She cant tell if is is clockwise or counterclockwise.  It did not seem to be a unilateral phenomenon.  There was no associated nausea, vomiting, double vision, headache, slurred speech dysphagia, numbness, weakness, or incoordination.  The spell passed after four hours and did not prevent her from going about her normal activities.  On Sunday night, she had a similar spell, this time with increased symptoms and noted that when she would turn over in bed the symptoms would be exacerbated. It did not appear to make any difference which way she turned.  She also noted that when she got up to go to the bathroom, although she did not  stagger walking into the bathroom, when she turned to sit down on the commode she halfway missed the commode.  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  Her symptoms are gone now and she feels perfectly fine at this time.  REVIEW OF SYSTEMS:  Mild arthritic changes in her hands.  No headache, nausea, chest pain, or shortness of breath currently.  Similar dizzy spell occurred two years ago.  PAST MEDICAL HISTORY:  Coronary artery disease, status post single-vessel angioplasty.  She has peripheral vascular disease, hypothyroidism, hypercholesterolemia, hypertension.  She is status post a cholecystectomy.  MEDICATIONS:  1. Aspirin.  2. Lovenox.  3. Protonix.  4. Lopressor.  5. Lopid.  6. Altace.  7. Synthroid.  8. Restoril.  9. Tylenol. 10. Laxative.  ALLERGIES:  No known drug allergies.  SOCIAL HISTORY:  She is widowed, remote smoker, quite active.  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  FAMILY HISTORY:  Positive for coronary artery disease.  PHYSICAL EXAMINATION:  VITAL SIGNS:  Temperature 98.6, pulse 70, blood pressure 148/75, respirations 20.  HEENT:  Head is normocephalic, atraumatic.  NECK:  Supple.  There is a quiet, high-pitched right bruit.  I do not hear one on the left.  LUNGS:  Clear to auscultation.  HEART:  Regular rate and rhythm.  EXTREMITIES:  Without edema.  NEUROLOGIC:  Mental status:  She is awake and alert.  Fully oriented with normal language and cognition.  Cranial nerve:  Pupils are equal and reactive. She has had an iridectomy in the right with cataract surgery on that side. Disk margins are poorly seen.  Extraocular movements are intact without any nystagmus.  Facial sensation is normal.  Facial motor activity is normal. Hearing is  intact.  Palate is symmetric and tongue is midline.  Motor exam: She has normal station and gait.  Normal bulk, tone, and strength throughout. Normal rapid fine movements.  No drift, no satelliting.  No fasciculations, tremors, or atrophy.  Reflexes are 2+ and symmetric with downgoing toes bilaterally.  Coordination:  Finger-to-nose, heel-to-shin, rapid alternating movement, and tandem gait is normal.  Sensory exam is normal to primary modalities.  LABORATORY DATA:  Carotid Doppler studies showed right ICA stenosis greater than 80%, left 40-60% ICA stenosis with antegrade vertebrals.  MRI is essentially normal with minimal or no small vessel disease.  MR angiogram shows a dominant right vertebral with absence of signal in the left vertebral which may be due to congenital absence or possibly stenosis in the more proximal left vertebral.  There is also a question of an aneurysm at the tip of the basilar, or more accurately in the first couple of millimeters of the left PCA.  ASSESSMENT: 1. Dizziness, resolved, nonspecific.  It could have been labyrinthitis, doubt    vertebrobasilar insufficiency.  Cannot rule out cardiovascular causes. 2. Asymptomatic 80% right carotid stenosis. 3. 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  PLAN:  I agree she will need conventional angiogram to evaluate carotid stenosis and the potential basilar aneurysm.  She will probably need a CVTS consult regarding the internal carotid artery stenosis.  We will follow after the angiogram is done.  Her symptoms have spontaneously resolved and I am not sure we will ever actually pinpoint what caused the dizziness but we may gain more information from the angiogram. DD:  03/17/01 TD:  03/18/01 Job: [id number]

## 2011-04-05 NOTE — H&P (Signed)
[place]. [facility]  Patient:    NUSAYBAH, IVIE Visit Number: [id number] MRN: [id number]          Service Type: MED Location: 1800 [id number] [id number] Attending Physician:  Lyn Records. Iii Dictated by:   Darci Needle, M.D. Admit Date:  04/28/2002 Discharge Date: 04/29/2002   CC:         Rodrigo Ran, M.D.  Peter M. Swaziland, M.D.   History and Physical  REASON FOR ADMISSION:  Chest pain.  HISTORY OF PRESENT ILLNESS:  The patient is 77 years of age and has a history of coronary artery disease.  She is status post PTCA x2 in 1995.  She is admitted at this time after the vague onset of substernal chest heaviness and dizziness that lasted approximately 2-1/2 hours.  The discomfort has gradually resolved.  This was not associated with EKG changes.  She is being admitted to rule out recurrent ischemic heart disease with significant symptoms.  MEDICATIONS: 1. Aspirin 325 mg p.o. q.d. 2. Metoprolol 50 mg b.i.d. 3. Altace 10 mg q.d. 4. Synthroid 0.1275 mg per day. 5. Zocor 20 mg per day. 6. Triamterene HCTZ 37.5/25 mg 1/2 tablet per day.  ALLERGIES:  None.  PAST MEDICAL HISTORY: 1. Hypertension. 2. Peripheral vascular disease with subclavian stenosis. 3. Carotid disease. 4. Hypothyroidism. 5. Status post cholecystectomy. 6. History of coronary artery disease, status post percutaneous transluminal    coronary angioplasty. 7. Hypertension.  SOCIAL HISTORY:  Discontinued tobacco smoking in 1995.  Denies alcohol consumption.  She swims on a regular basis, no limitations.  REVIEW OF SYSTEMS:  Unremarkable.  FAMILY HISTORY:  Father died of myocardial infarction at age 86.  Brother underwent coronary artery bypass grafting at age 36.  PHYSICAL EXAMINATION: VITAL SIGNS:  Blood pressure 160/70, heart rate 80.  She is afebrile. Respirations 16 and nonlabored.  SKIN:  Unremarkable.  No cyanosis.  HEENT:  No xanthelasma.  Extraocular movements are  full.  CHEST:  Clear.  No rales, no wheezing.  CARDIAC:  No click, no rub.  An S4 gallop is audible.  ABDOMEN:  Soft, no masses.  Bowel sounds are normal.  EXTREMITIES:  No edema.  NEUROLOGICAL:  Alert and oriented x3.  LABORATORY DATA:  EKG is normal.  Chest x-ray:  Chronic obstructive pulmonary disease with no acute disease.  ASSESSMENT: 1. Chest pain, rule out myocardial infarction. 2. Hypertension, needs slightly better control.  PLAN: 1. Admit to rule out myocardial infarction protocol. 2. NPO until seen by Dr. Swaziland in the a.m. of April 29, 2002. 3. Continue usual medications. 4. Lovenox. Dictated by:   Darci Needle, M.D. Attending Physician:  Lyn Records. Iii DD:  04/29/02 TD:  05/01/02 Job: [id number]

## 2011-04-05 NOTE — Consult Note (Signed)
[name]  Patient:    [name], [name]                         MRN: [id number] Proc. Date: 03/17/01 Adm. Date:  [id number] Attending:  Ezequiel Kayser CC:         Hewitt Shorts, M.D.  Rodrigo Ran, M.D./Catherine A. Orlin Hilding, M.D.   Consultation Report  REASON FOR CONSULTATION: The patient is a 77 year old right-handed white female, who was admitted yesterday by Dr. Waynard Edwards because of a four day history of vertigo.  The patient explains that five days ago she had an episode lasting about four hours of mild light headedness and dizziness, with some sensation of spinning. It resolved on its own spontaneously and she did not seek medical attention. Two days ago her symptoms recurred and were more intense, and persisted.  They were still present yesterday and because they continued to persist she saw Dr. Waynard Edwards in the office, and he admitted her to the hospital for further evaluation.  The patient describes the symptoms as including both light headedness and dizziness.  She did not sustain loss of consciousness and had no seizure activity.  She does describe a sense of things moving about her as if spinning.  She does not describe any weakness, numbness, tingling, headache, loss of vision, diplopia, loss of hearing, difficulty with speech or swallowing, or other neurologic difficulty.  The patient was admitted by Dr. Waynard Edwards and underwent MRI and MRA, as well as carotid Doppler studies.  MRI and MRA showed no evidence of an acute stroke on her diffusion images, and the most notable finding was that is was suspicious for a small basilar tip aneurysm.  Also noted was mild to moderate cerebral atrophy, a bit more pronounced than typical for her age.  Lastly, there is evidence of small vessel brainstem cerebrovascular disease - but, again, no acute changes.  Carotid Dopplers showed the right internal carotid artery had greater than 80% stenosis at  its bifurcation and the left internal carotid artery had 40-60% stenosis at its bifurcation.  Dr. Waynard Edwards discussed the case with Dr. Liliane Bade from [facility], who recommended neurosurgical consultation.  Dr. Waynard Edwards consulted me and we discussed the case, and I agreed to evaluate the patient, particularly as regards the possible basilar tip aneurysm, but recommended that stroke and neurology consultation be obtained as well, and explained that although we do handle intracranial cerebrovascular disease if surgery is required CV/TS will need to manage the extracranial carotid disease.  PAST MEDICAL HISTORY:  1. History of hypertension, treated for the past five to eight years.  2. History of significant atherosclerotic cardiovascular disease.  She     required a PTCA in 1995, with repeat PTCA six months later for recurrent     symptoms and recurrent coronary artery stenosis.  3. She has been followed, with another coronary arteriogram three years later     which showed good patency of her coronary vessels, and also followed by     cardiac stress test, the last done probably over two years ago.  Her     cardiologist is Dr. Peter [place].  4. She does have a history of vascular claudication, which has been evaluated     and monitored, and apparently has known left subclavian artery stenosis.  She does not describe any previous history of atherosclerotic cerebrovascular disease and does not describe any history of previous strokes or TIAs.  She  also does not describe any history of cancer, peptic ulcer disease, diabetes, or lung disease.  PAST SURGICAL HISTORY:  1. Cholecystectomy.  2. History of colon polyps.  ALLERGIES: She denies allergies to medications.  CURRENT MEDICATIONS:  1. Lopressor 50 mg b.i.d.  2. Levoxyl 0.137 mg q.d.  3. Lopid 600 mg b.i.d.  4. Prempro 0.625/2.5 mg q.d.  5. Aspirin 81 mg b.i.d.  6. Variety of vitamins.  7. Altace 10 mg q.d.  8. She was started on  Lovenox at the time of hospitalization.  FAMILY HISTORY: Her mother died at the age of 37 of complications of an infected knee graft with resulting staph endocarditis.  Her father died at age 6 of heart failure, having suffered a myocardial infarction about three months earlier.  SOCIAL HISTORY: The patient is a widow.  She is a retired Producer, television/film/video.  She retired in 1995 after having worked at [employer] for 40 years.  She quit smoking in 1995 but smoked prior to that for about 44 years. She does not drink alcoholic beverages on a regular basis.  REVIEW OF SYSTEMS: Notable for that described in the History of Present Illness and Past Medical History, otherwise unremarkable.  PHYSICAL EXAMINATION:  GENERAL: The patient is a well-developed, well-nourished white female who is in no acute distress.  VITAL SIGNS: Temperature 98.6 degrees, pulse 70, blood pressure 145/75, respiratory rate 16.  Height 5 feet 4 inches.  Weight 77 kg.  LUNGS: Clear to auscultation.  She has symmetric respiratory excursion.  HEART: Regular rate and rhythm, normal S1 and S2.  ABDOMEN: Soft, nondistended.  Bowel sounds present.  EXTREMITIES: No clubbing, cyanosis, or edema.  NEUROLOGIC: Mental status shows the patient to be awake and alert, fully oriented.  Speech is fluent and she has good comprehension.  Cranial nerves show the pupils to be equal, round, and reactive to light, about 3 mm bilaterally.  Extraocular movements were intact.  She denies any diplopia. Facial sensation is intact.  Facial movement is symmetrical.  Hearing is intact bilaterally.  Palatal movement is symmetrical.  Shoulder shrug is symmetrical.  The tongue protrudes in the midline.  Motor examination shows 5/5 strength in the upper and lower extremities.  She has no drift to the upper extremities.  She has good fine movements.  Sensation is intact to pinprick.  Reflexes are 2 in the biceps, triceps, quadriceps,  and  gastrocnemius and symmetric bilaterally.  Toes are downgoing bilaterally. Cerebellar testing shows no dysmetria on finger-to-nose testing nor any dysdiadochokinesia on rapid alternating movements.  She has normal gait and normal tandem gait.  IMPRESSION:  1. Vertigo, now improved, which may be due to inner ear dysfunction.  On     the other hand, it may be a symptom of vertebrobasilar insufficiency.  2. 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  3. Extracranial parotid artery disease.  4. Atherosclerotic cardiovascular disease.  5. Peripheral vascular disease.  RECOMMENDATIONS: I spoke with the patient as well as Dr. Waynard Edwards at length. Certainly definitive arteriography would be beneficial.  She will need studies of both the extracranial parotid arteries as well as four-vessel cerebral arteriogram.  We will be able to provide further recommendations after the arteriogram is completed.  However, as regards the possible basilar artery aneurysm, if this is demonstrated and, in fact, if it is small then I would tend to favor no intervention of this lesion.  I have discussed with the patient the natural history is cerebral aneurysms and I  tend to favor that the benefits of surgical intervention are outweighed by the risks of surgical intervention. If the arteriogram in fact shows that the aneurysm is larger than suggested by the MRA, then recommendation for surgery could be made.  Therefore, final recommendation awaits the cerebral arteriogram.  Also, if the patient is determined to be suffering from vertebrobasilar insufficiency, critically this is treated with anticoagulation; however, this would be contraindicated, as would antiplatelet therapy with an agent such as Plavix due to the increased risk of aneurysmal hemorrhage, and although the patient would represent aspirin failure since she has been on aspirin and if vertebrobasilar insufficiency is determined it would seem that  again weighing the potential benefits of anticoagulation or antiplatelet therapy with Plavix would be outweighed by the risks of cerebral aneurysmal hemorrhage.  It will certainly be helpful to get Dr. Madilyn Fireman opinion, assessment, and recommendations regarding her extracranial parotid artery disease.  Further, it will be helpful to obtain a stroke neurology consultation.  We will continue to follow the patient along with you and be able to provide final recommendations. DD:  03/17/01 TD:  03/18/01 Job: [id number]

## 2011-04-05 NOTE — Consult Note (Signed)
[place]  Patient:    [name], [name]                         MRN: [id number] Proc. Date: 03/18/01 Adm. Date:  [id number] Attending:  Ezequiel Kayser CC:         Rodrigo Ran, M.D.  Denman George, M.D.  Catherine A. Orlin Hilding, M.D.  Patients chart   Consultation Report  REFERRING PHYSICIAN:  Rodrigo Ran, M.D.  REASON FOR CONSULTATION:  Extracranial cerebrovascular occlusive disease.  HISTORY OF PRESENT ILLNESS:  This is a 77 year old female, retired Designer, jewellery, who is admitted to [facility] on March 16, 2001 by Dr. Rodrigo Ran following the onset of recurrent episodes of vertigo.  The patient describes two distinct episodes of vertigo occurring for four to six hours.  These occurred on the Friday and Sunday prior to admission.  She denies sensory, motor, visual, or speech problems associated with this.  There was no position change associated with this.  The symptoms resolved spontaneously.  Doppler evaluation revealed evidence of right internal carotid artery stenosis and left subclavian stenosis.  MRI and MR angiogram revealed no evidence of acute cerebrovascular event.  There was, however, right internal carotid artery stenosis and left subclavian stenosis and a question of possible basilar artery aneurysm.  An arteriogram has been performed and this reveals an 80-85% proximal right internal carotid artery stenosis, 30% proximal left internal carotid artery stenosis, 80% proximal left subclavian stenosis.  No basilar or other intracranial aneurysms were identified.  PAST MEDICAL HISTORY:  Hypertension.  Coronary artery disease, status post PTCA in 1995.  Peripheral vascular disease with lower extremity claudication.  PAST SURGICAL HISTORY:  Cholecystectomy.  Resection of colonic polyps.  ALLERGIES:  None known.  MEDICATIONS: 1. Lopressor 50 mg p.o. b.i.d. 2. Levoxyl 0.137 mg q.d. 3. Lopid 600 mg  b.i.d. 4. Prempro 0.625/2.5 mg q.d. 5. Aspirin 81 mg b.i.d. 6. Multivitamin. 7. Altace 10 mg q.d.  FAMILY HISTORY:  Her mother died at age 75 as a result of infectious Staphylococcus endocarditis.  Father died at age 63 of heart failure with a history of coronary artery disease.  SOCIAL HISTORY:  The patient is a widow.  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  The patient has not used tobacco products since 1995; however, has a 44-pack-year history.  She does not drink alcohol.  REVIEW OF SYSTEMS:  Denies recent chest pain, palpitations, or shortness of breath.  No cough or sputum production.  No abdominal pain.  Weight is stable. No anorexia.  Denies nausea, vomiting, constipation, or diarrhea.  No dysuria or frequency.  PHYSICAL EXAMINATION:  GENERAL:  Well-appearing 77 year old female who is alert and oriented.  No distress.  No cyanosis, pallor, or jaundice.  VITAL SIGNS:  Temperature 98.6.  Blood pressure is 130/65, pulse is 65 per minute and regular.  Respirations 20 per minute.  HEENT:  Mouth and throat are clear.  Normocephalic.  NECK:  No thyromegaly or adenopathy.  Right carotid bruit and left subclavian bruit audible.  CHEST:  Clear to auscultation and percussion.  CARDIOVASCULAR:  Normal heart sounds without extra sounds.  A 1/6 systolic ejection murmur left sternal border.  ABDOMEN:  Soft and nontender.  No organomegaly or masses felt.  Bowel sounds active.  No bruits.  NEUROLOGICAL:  Alert and oriented. Cranial nerves intact.  Strength equal bilaterally.  There are 2+  reflexes.  Sensation normal.  IMPRESSION: 1. Extracranial cerebrovascular occlusive disease-80% right internal carotid    artery stenosis, 80% proximal left subclavian stenosis. 2. Chronic hypertension. 3. Coronary artery disease. 4. Peripheral vascular occlusive disease.  RECOMMENDATIONS:  Right carotid endarterectomy for reduction of stroke  risk. Re-evaluation of left subclavian stenosis following surgery.  Continue risk factor modification.  DISPOSITION:  The patient will be tentatively scheduled for right carotid endarterectomy on Friday, Mar 20, 2001, at [facility].  Risk of this operative procedure include, but are not limited to, MI, CVA, death, bleeding, infection, and cranial nerve injury with a risk rate of approximately 1% to 2%. DD:  03/18/01 TD:  03/19/01 Job: [id number]

## 2011-04-11 ENCOUNTER — Other Ambulatory Visit: Payer: Self-pay | Admitting: Cardiology

## 2011-04-11 NOTE — Telephone Encounter (Signed)
escribe medication per  Fax request

## 2011-06-08 ENCOUNTER — Other Ambulatory Visit: Payer: Self-pay | Admitting: Cardiology

## 2011-06-10 NOTE — Telephone Encounter (Signed)
escribe medication per fax request  

## 2011-08-07 ENCOUNTER — Encounter: Payer: Self-pay | Admitting: *Deleted

## 2011-08-08 ENCOUNTER — Encounter: Payer: Self-pay | Admitting: *Deleted

## 2011-08-14 ENCOUNTER — Ambulatory Visit: Payer: PRIVATE HEALTH INSURANCE | Admitting: Cardiology

## 2011-08-15 LAB — URINALYSIS, ROUTINE W REFLEX MICROSCOPIC
Ketones, ur: NEGATIVE
Nitrite: NEGATIVE
Urobilinogen, UA: 0.2

## 2011-08-15 LAB — BASIC METABOLIC PANEL
BUN: 28 — ABNORMAL HIGH
CO2: 18 — ABNORMAL LOW
CO2: 19
Calcium: 8.1 — ABNORMAL LOW
Chloride: 86 — ABNORMAL LOW
Chloride: 88 — ABNORMAL LOW
GFR calc Af Amer: 42 — ABNORMAL LOW
Glucose, Bld: 100 — ABNORMAL HIGH
Glucose, Bld: 80
Potassium: 3.8
Sodium: 114 — CL
Sodium: 118 — CL

## 2011-08-15 LAB — POCT I-STAT, CHEM 8
BUN: 20
Calcium, Ion: 1.17
Chloride: 100
Glucose, Bld: 127 — ABNORMAL HIGH
HCT: 36
Potassium: 4.4

## 2011-08-15 LAB — URINE MICROSCOPIC-ADD ON

## 2011-08-15 LAB — POCT CARDIAC MARKERS
CKMB, poc: 1.2
Myoglobin, poc: 80.4
Troponin i, poc: <0.05

## 2011-08-15 LAB — CBC
HCT: 29.2 — ABNORMAL LOW
MCHC: 35
Platelets: 113 — ABNORMAL LOW
RDW: 15

## 2011-08-15 LAB — URINE CULTURE

## 2011-08-15 LAB — COMPREHENSIVE METABOLIC PANEL
Albumin: 2.3 — ABNORMAL LOW
Alkaline Phosphatase: 80
BUN: 34 — ABNORMAL HIGH
Calcium: 7.9 — ABNORMAL LOW
Potassium: 4.1
Sodium: 113 — CL
Total Protein: 5.9 — ABNORMAL LOW

## 2011-08-16 LAB — BASIC METABOLIC PANEL
BUN: 12
BUN: 26 — ABNORMAL HIGH
BUN: 28 — ABNORMAL HIGH
CO2: 17 — ABNORMAL LOW
CO2: 19
CO2: 19
Calcium: 7.8 — ABNORMAL LOW
Calcium: 8.2 — ABNORMAL LOW
Chloride: 89 — ABNORMAL LOW
Chloride: 93 — ABNORMAL LOW
Creatinine, Ser: 0.93
Creatinine, Ser: 1.27 — ABNORMAL HIGH
Creatinine, Ser: 1.31 — ABNORMAL HIGH
GFR calc Af Amer: 48 — ABNORMAL LOW
GFR calc non Af Amer: 59 — ABNORMAL LOW
Glucose, Bld: 103 — ABNORMAL HIGH
Glucose, Bld: 105 — ABNORMAL HIGH
Glucose, Bld: 112 — ABNORMAL HIGH
Glucose, Bld: 78
Potassium: 3.8
Potassium: 3.9
Potassium: 4.8
Sodium: 121 — ABNORMAL LOW

## 2011-08-16 LAB — COMPREHENSIVE METABOLIC PANEL
AST: 26
Albumin: 2.1 — ABNORMAL LOW
Calcium: 8.4
Creatinine, Ser: 1.2
GFR calc Af Amer: 53 — ABNORMAL LOW
Sodium: 133 — ABNORMAL LOW
Total Protein: 6

## 2011-08-16 LAB — CBC
HCT: 26.9 — ABNORMAL LOW
HCT: 27.8 — ABNORMAL LOW
HCT: 28.3 — ABNORMAL LOW
HCT: 34.5 — ABNORMAL LOW
Hemoglobin: 9.4 — ABNORMAL LOW
Hemoglobin: 9.5 — ABNORMAL LOW
MCHC: 33.6
MCHC: 33.9
MCHC: 34
MCV: 79.8
MCV: 80
Platelets: 125 — ABNORMAL LOW
Platelets: 214
Platelets: 99 — ABNORMAL LOW
RDW: 15.6 — ABNORMAL HIGH
RDW: 15.7 — ABNORMAL HIGH
RDW: 15.8 — ABNORMAL HIGH
WBC: 12.4 — ABNORMAL HIGH

## 2011-08-16 LAB — TSH: TSH: 1.294

## 2011-08-16 LAB — CULTURE, BLOOD (ROUTINE X 2): Culture: NO GROWTH

## 2011-08-16 LAB — B-NATRIURETIC PEPTIDE (CONVERTED LAB): Pro B Natriuretic peptide (BNP): 397 — ABNORMAL HIGH

## 2011-08-19 ENCOUNTER — Encounter: Payer: Self-pay | Admitting: Cardiology

## 2011-08-19 ENCOUNTER — Ambulatory Visit (INDEPENDENT_AMBULATORY_CARE_PROVIDER_SITE_OTHER): Payer: Medicare Other | Admitting: Cardiology

## 2011-08-19 VITALS — BP 120/62 | HR 66 | Ht 65.0 in | Wt 196.0 lb

## 2011-08-19 DIAGNOSIS — I739 Peripheral vascular disease, unspecified: Secondary | ICD-10-CM

## 2011-08-19 DIAGNOSIS — I10 Essential (primary) hypertension: Secondary | ICD-10-CM

## 2011-08-19 DIAGNOSIS — E785 Hyperlipidemia, unspecified: Secondary | ICD-10-CM

## 2011-08-19 DIAGNOSIS — R609 Edema, unspecified: Secondary | ICD-10-CM

## 2011-08-19 DIAGNOSIS — I2510 Atherosclerotic heart disease of native coronary artery without angina pectoris: Secondary | ICD-10-CM

## 2011-08-19 NOTE — Patient Instructions (Signed)
Continue your current medications.  You need to lose weight.  Avoid sodium intake.  I will see you again in 6 months.

## 2011-08-20 DIAGNOSIS — I2510 Atherosclerotic heart disease of native coronary artery without angina pectoris: Secondary | ICD-10-CM | POA: Insufficient documentation

## 2011-08-20 DIAGNOSIS — I739 Peripheral vascular disease, unspecified: Secondary | ICD-10-CM | POA: Insufficient documentation

## 2011-08-20 DIAGNOSIS — I10 Essential (primary) hypertension: Secondary | ICD-10-CM | POA: Insufficient documentation

## 2011-08-20 DIAGNOSIS — E785 Hyperlipidemia, unspecified: Secondary | ICD-10-CM | POA: Insufficient documentation

## 2011-08-20 NOTE — Assessment & Plan Note (Signed)
Blood pressure is well-controlled today as well as by her own records. We will continue with her antihypertensive therapy.

## 2011-08-20 NOTE — Assessment & Plan Note (Signed)
She remains asymptomatic. She had remote angioplasty the right coronary in 1995. Her last nuclear stress test in September of 2009 was normal. Continue with her current treatment and risk factor modification.

## 2011-08-20 NOTE — Progress Notes (Signed)
[name] Date of Birth: August 01, 1929   History of Present Illness: [name] is seen today for followup. She has a history of coronary disease and had remote angioplasty of the mid right coronary in 1995. She continues to do very well from a cardiac standpoint. She denies any chest pain or shortness of breath. She continues to exercise regularly. She admits that her salt intake has been increased recently and as a result she has been having some swelling in her left leg and foot. It does go down at night. She has gained 14 pounds since her last visit.  Current Outpatient Prescriptions on File Prior to Visit  Medication Sig Dispense Refill  . amLODipine (NORVASC) 5 MG tablet TAKE 1 TABLET EVERY DAY  30 tablet  5  . aspirin 81 MG tablet Take 81 mg by mouth daily.        . cholecalciferol (VITAMIN D) 1000 UNITS tablet Take 1,000 Units by mouth daily.        . Coenzyme Q10 (CO Q-10 PO) Take 1 Can by mouth daily.        . Cyanocobalamin (VITAMIN B-12 PO) Take 1 tablet by mouth daily.        . fish oil-omega-3 fatty acids 1000 MG capsule Take 1 g by mouth daily.        Marland Kitchen FOLIC ACID PO Take 1 tablet by mouth daily.        Marland Kitchen levothyroxine (SYNTHROID, LEVOTHROID) 137 MCG tablet Take 137 mcg by mouth daily.        . metoprolol (LOPRESSOR) 50 MG tablet TAKE 1 & 1/2 TABLET BY MOUTH TWICE A DAY  90 tablet  5  . multivitamin (THERAGRAN) per tablet Take 1 tablet by mouth daily. Icaps      . niacin (NIASPAN) 1000 MG CR tablet Take 1,000 mg by mouth at bedtime.        . ramipril (ALTACE) 10 MG capsule Take 1 tablet by mouth Daily.      . rosuvastatin (CRESTOR) 20 MG tablet Take 20 mg by mouth daily.          No Known Allergies  Past Medical History  Diagnosis Date  . Hypertension     well controlled  . Hyperlipidemia   . PAD (peripheral artery disease)     with stable claudication  . Hyperthyroidism   . Osteopenia   . Lupus anticoagulant disorder   . History of obesity   . Basal cell cancer     Nose  . B12 deficiency   . Cataract   . First degree atrioventricular block   . Colon polyps   . Coronary artery disease     status post angioplasty of the mid RCA in 1995    Past Surgical History  Procedure Date  . Endarterectomy     right carotid endarterectomy  . Cholecystectomy 1999    History  Smoking status  . Former Smoker -- 1.0 packs/day for 40 years  . Types: Cigarettes  . Quit date: 11/18/1993  Smokeless tobacco  . Not on file    History  Alcohol Use No    Family History  Problem Relation Age of Onset  . Heart attack Father 46  . Diabetes Sister   . Heart disease Brother     underwent coronary bypass grafting at age 2    Review of Systems: The review of systems is positive for left leg swelling.  All other systems were reviewed and are negative.  Physical Exam: BP 120/62  Pulse 66  Ht 5\' 5"  (1.651 m)  Wt 196 lb (88.905 kg)  BMI 32.62 kg/m2 She is an overweight white female in no acute distress. She is normocephalic, atraumatic. Pupils are equal round and reactive. Sclera clear. Oropharynx is clear. Neck is without JVD, adenopathy, thyromegaly, or bruits. Lungs are clear. Cardiac exam reveals a regular rate and rhythm without a gallop. There is a soft systolic ejection murmur the right upper sternal border. Abdomen is soft and nontender. She has no motion edema. Pedal pulses are diminished. Skin is warm and dry. She is alert oriented x3. Cranial nerves II through XII are intact. LABORATORY DATA: ECG today demonstrates normal sinus rhythm with first degree AV block. There is low voltage. Otherwise normal.  Assessment / Plan:

## 2011-08-20 NOTE — Assessment & Plan Note (Signed)
Her claudication symptoms are stable. She quit smoking in 1995. She will continue with her exercise program.

## 2011-09-17 ENCOUNTER — Other Ambulatory Visit (INDEPENDENT_AMBULATORY_CARE_PROVIDER_SITE_OTHER): Payer: Medicare Other | Admitting: *Deleted

## 2011-09-17 ENCOUNTER — Other Ambulatory Visit: Payer: Self-pay

## 2011-09-17 DIAGNOSIS — Z48812 Encounter for surgical aftercare following surgery on the circulatory system: Secondary | ICD-10-CM

## 2011-09-17 DIAGNOSIS — I6529 Occlusion and stenosis of unspecified carotid artery: Secondary | ICD-10-CM

## 2011-09-19 ENCOUNTER — Other Ambulatory Visit: Payer: Self-pay | Admitting: Vascular Surgery

## 2011-09-19 ENCOUNTER — Encounter: Payer: Self-pay | Admitting: Vascular Surgery

## 2011-09-19 DIAGNOSIS — I6529 Occlusion and stenosis of unspecified carotid artery: Secondary | ICD-10-CM

## 2011-09-19 DIAGNOSIS — Z48812 Encounter for surgical aftercare following surgery on the circulatory system: Secondary | ICD-10-CM

## 2011-09-19 NOTE — Procedures (Unsigned)
CAROTID DUPLEX EXAM  INDICATION:  Followup carotid endarterectomy  HISTORY: Diabetes:  No Cardiac:  Coronary artery disease Hypertension:  Yes Smoking:  Previous Previous Surgery:  Right carotid endarterectomy 03/09/2001 CV History:  Currently asymptomatic Amaurosis Fugax No, Paresthesias No, Hemiparesis No                                      RIGHT             LEFT Brachial systolic pressure:         130               117 Brachial Doppler waveforms:         Normal            Abnormal Vertebral direction of flow:        Antegrade         Not visualized DUPLEX VELOCITIES (cm/sec) CCA peak systolic                   91                106 ECA peak systolic                   124               165 ICA peak systolic                   132               134 ICA end diastolic                   27                34 PLAQUE MORPHOLOGY:                  Heterogeneous     Heterogeneous PLAQUE AMOUNT:                      Mild              Moderate PLAQUE LOCATION:                    ICA, ECA          ICA, ECA  IMPRESSION: 1. Right internal carotid artery appears patent status post carotid     endarterectomy. 2. Left internal carotid artery velocities suggest 1%-39% stenosis. 3. Difference in brachial pressure noted.  ___________________________________________ Di Kindle. Edilia Bo, M.D.  EM/MEDQ  D:  09/17/2011  T:  09/17/2011  Job:  [id number]

## 2011-10-07 ENCOUNTER — Other Ambulatory Visit: Payer: Self-pay | Admitting: Cardiology

## 2011-12-02 ENCOUNTER — Other Ambulatory Visit: Payer: Self-pay | Admitting: *Deleted

## 2011-12-02 DIAGNOSIS — D485 Neoplasm of uncertain behavior of skin: Secondary | ICD-10-CM | POA: Diagnosis not present

## 2011-12-02 DIAGNOSIS — L570 Actinic keratosis: Secondary | ICD-10-CM | POA: Diagnosis not present

## 2011-12-02 MED ORDER — AMLODIPINE BESYLATE 5 MG PO TABS: 5.0000 mg | ORAL_TABLET | Freq: Every day | ORAL | Status: DC

## 2011-12-18 ENCOUNTER — Encounter: Payer: Self-pay | Admitting: Cardiology

## 2011-12-18 DIAGNOSIS — I10 Essential (primary) hypertension: Secondary | ICD-10-CM | POA: Diagnosis not present

## 2011-12-18 DIAGNOSIS — D531 Other megaloblastic anemias, not elsewhere classified: Secondary | ICD-10-CM | POA: Diagnosis not present

## 2011-12-18 DIAGNOSIS — M949 Disorder of cartilage, unspecified: Secondary | ICD-10-CM | POA: Diagnosis not present

## 2011-12-18 DIAGNOSIS — E785 Hyperlipidemia, unspecified: Secondary | ICD-10-CM | POA: Diagnosis not present

## 2011-12-18 DIAGNOSIS — R7301 Impaired fasting glucose: Secondary | ICD-10-CM | POA: Diagnosis not present

## 2011-12-18 DIAGNOSIS — M899 Disorder of bone, unspecified: Secondary | ICD-10-CM | POA: Diagnosis not present

## 2011-12-18 DIAGNOSIS — E039 Hypothyroidism, unspecified: Secondary | ICD-10-CM | POA: Diagnosis not present

## 2011-12-18 DIAGNOSIS — R82998 Other abnormal findings in urine: Secondary | ICD-10-CM | POA: Diagnosis not present

## 2011-12-25 DIAGNOSIS — D649 Anemia, unspecified: Secondary | ICD-10-CM | POA: Diagnosis not present

## 2011-12-25 DIAGNOSIS — Z0000 Encounter for general adult medical examination without abnormal findings: Secondary | ICD-10-CM | POA: Diagnosis not present

## 2011-12-25 DIAGNOSIS — I10 Essential (primary) hypertension: Secondary | ICD-10-CM | POA: Diagnosis not present

## 2011-12-25 DIAGNOSIS — E785 Hyperlipidemia, unspecified: Secondary | ICD-10-CM | POA: Diagnosis not present

## 2011-12-26 ENCOUNTER — Other Ambulatory Visit (HOSPITAL_COMMUNITY): Payer: Self-pay | Admitting: Internal Medicine

## 2011-12-26 DIAGNOSIS — Z1231 Encounter for screening mammogram for malignant neoplasm of breast: Secondary | ICD-10-CM

## 2011-12-30 DIAGNOSIS — Z1212 Encounter for screening for malignant neoplasm of rectum: Secondary | ICD-10-CM | POA: Diagnosis not present

## 2012-01-16 DIAGNOSIS — N3090 Cystitis, unspecified without hematuria: Secondary | ICD-10-CM | POA: Diagnosis not present

## 2012-01-23 ENCOUNTER — Ambulatory Visit (HOSPITAL_COMMUNITY)
Admission: RE | Admit: 2012-01-23 | Discharge: 2012-01-23 | Disposition: A | Discharge status: Home / Self Care | Payer: Medicare Other | Source: Ambulatory Visit | Attending: Internal Medicine | Admitting: Internal Medicine

## 2012-01-23 DIAGNOSIS — Z1231 Encounter for screening mammogram for malignant neoplasm of breast: Secondary | ICD-10-CM | POA: Insufficient documentation

## 2012-02-18 ENCOUNTER — Ambulatory Visit: Payer: Medicare Other | Admitting: Cardiology

## 2012-02-25 ENCOUNTER — Encounter: Payer: Self-pay | Admitting: Cardiology

## 2012-02-25 ENCOUNTER — Ambulatory Visit (INDEPENDENT_AMBULATORY_CARE_PROVIDER_SITE_OTHER): Payer: Medicare Other | Admitting: Cardiology

## 2012-02-25 VITALS — BP 130/72 | HR 80 | Ht 65.0 in | Wt 193.0 lb

## 2012-02-25 DIAGNOSIS — E785 Hyperlipidemia, unspecified: Secondary | ICD-10-CM | POA: Diagnosis not present

## 2012-02-25 DIAGNOSIS — I779 Disorder of arteries and arterioles, unspecified: Secondary | ICD-10-CM

## 2012-02-25 DIAGNOSIS — I739 Peripheral vascular disease, unspecified: Secondary | ICD-10-CM

## 2012-02-25 DIAGNOSIS — I10 Essential (primary) hypertension: Secondary | ICD-10-CM

## 2012-02-25 DIAGNOSIS — I2510 Atherosclerotic heart disease of native coronary artery without angina pectoris: Secondary | ICD-10-CM | POA: Diagnosis not present

## 2012-02-25 NOTE — Progress Notes (Signed)
[name] Date of Birth: 1929/09/27   History of Present Illness: [name] is seen today for followup. She has a history of coronary disease and had remote angioplasty of the mid right coronary in 1995. She reports she is feeling quite well. She denies any significant symptoms of chest pain or shortness of breath. She's had no TIA symptoms. She did trip and cut her left great toe including severing a tendon in October. This was repaired surgically. This limited her activity for a period of time but she has fully recovered.  Current Outpatient Prescriptions on File Prior to Visit  Medication Sig Dispense Refill  . amLODipine (NORVASC) 5 MG tablet Take 1 tablet (5 mg total) by mouth daily.  30 tablet  5  . aspirin 81 MG tablet Take 81 mg by mouth daily.        . cholecalciferol (VITAMIN D) 1000 UNITS tablet Take 1,000 Units by mouth daily.        . Coenzyme Q10 (CO Q-10 PO) Take 1 Can by mouth daily.        . Cyanocobalamin (VITAMIN B-12 PO) Take 1 tablet by mouth daily.        . fish oil-omega-3 fatty acids 1000 MG capsule Take 1 g by mouth daily.        Marland Kitchen FOLIC ACID PO Take 1 tablet by mouth daily.        Marland Kitchen levothyroxine (SYNTHROID, LEVOTHROID) 137 MCG tablet Take 137 mcg by mouth daily.        . metoprolol (LOPRESSOR) 50 MG tablet TAKE 1 & 1/2 TABLET BY MOUTH TWICE A DAY  90 tablet  5  . multivitamin (THERAGRAN) per tablet Take 1 tablet by mouth daily. Icaps      . niacin (NIASPAN) 1000 MG CR tablet Take 1,000 mg by mouth at bedtime.        . ramipril (ALTACE) 10 MG capsule Take 1 tablet by mouth Daily.      . rosuvastatin (CRESTOR) 20 MG tablet Take 20 mg by mouth daily.          No Known Allergies  Past Medical History  Diagnosis Date  . Hypertension     well controlled  . Hyperlipidemia   . PAD (peripheral artery disease)     with stable claudication  . Hyperthyroidism   . Osteopenia   . Lupus anticoagulant disorder   . History of obesity   . Basal cell cancer     Nose    . B12 deficiency   . Cataract   . First degree atrioventricular block   . Colon polyps   . Coronary artery disease     status post angioplasty of the mid RCA in 1995    Past Surgical History  Procedure Date  . Endarterectomy     right carotid endarterectomy  . Cholecystectomy 1999  . Repair tendons foot     History  Smoking status  . Former Smoker -- 1.0 packs/day for 40 years  . Types: Cigarettes  . Quit date: 11/18/1993  Smokeless tobacco  . Not on file    History  Alcohol Use No    Family History  Problem Relation Age of Onset  . Heart attack Father 47  . Diabetes Sister   . Heart disease Brother     underwent coronary bypass grafting at age 4    Review of Systems: The review of systems is positive for some chronic claudication. This is unchanged.  She has  yearly Doppler studies at [facility]. All other systems were reviewed and are negative.  Physical Exam: BP 130/72  Pulse 80  Ht 5\' 5"  (1.651 m)  Wt 193 lb (87.544 kg)  BMI 32.12 kg/m2 She is an overweight white female in no acute distress. She is normocephalic, atraumatic. Pupils are equal round and reactive. Sclera clear. Oropharynx is clear. Neck is without JVD, adenopathy, thyromegaly, or bruits. Lungs are clear. Cardiac exam reveals a regular rate and rhythm without a gallop. There is a soft systolic ejection murmur the right upper sternal border. Abdomen is soft and nontender. She has no motion edema. Pedal pulses are diminished. Skin is warm and dry. She is alert oriented x3. Cranial nerves II through XII are intact. LABORATORY DATA: Blood work was reviewed from 12/18/2011. Complete chemistry panel was normal. CBC was normal. Cholesterol 144, triglycerides 113, HDL 58, LDL 63. Urinalysis was negative.  Assessment / Plan:

## 2012-02-25 NOTE — Assessment & Plan Note (Signed)
Lipids are at goal. We will continue with fish oil and Crestor and niacin.

## 2012-02-25 NOTE — Patient Instructions (Signed)
Continue your current medication  We will schedule you for a nuclear stress test.  Otherwise I'll see you in 6 months.

## 2012-02-25 NOTE — Assessment & Plan Note (Signed)
She has stable claudication symptoms. She has had a previous right CEA. She has yearly Doppler studies at the [facility].

## 2012-02-25 NOTE — Assessment & Plan Note (Signed)
She remains asymptomatic. It has been 3-1/2 years since her last stress test. We will schedule her for a Lexiscan Myoview study. We will continue with her medical therapy and risk factor modification.

## 2012-02-25 NOTE — Assessment & Plan Note (Signed)
Blood pressure is at goal her current medical therapy. We will continue with metoprolol, amlodipine, and Altace.

## 2012-03-03 ENCOUNTER — Ambulatory Visit (HOSPITAL_COMMUNITY): Payer: Medicare Other | Attending: Cardiology | Admitting: Radiology

## 2012-03-03 VITALS — BP 118/66 | Ht 65.0 in | Wt 195.0 lb

## 2012-03-03 DIAGNOSIS — Z9861 Coronary angioplasty status: Secondary | ICD-10-CM | POA: Diagnosis not present

## 2012-03-03 DIAGNOSIS — Z87891 Personal history of nicotine dependence: Secondary | ICD-10-CM | POA: Insufficient documentation

## 2012-03-03 DIAGNOSIS — E785 Hyperlipidemia, unspecified: Secondary | ICD-10-CM | POA: Diagnosis not present

## 2012-03-03 DIAGNOSIS — I779 Disorder of arteries and arterioles, unspecified: Secondary | ICD-10-CM | POA: Insufficient documentation

## 2012-03-03 DIAGNOSIS — R0609 Other forms of dyspnea: Secondary | ICD-10-CM | POA: Insufficient documentation

## 2012-03-03 DIAGNOSIS — I10 Essential (primary) hypertension: Secondary | ICD-10-CM | POA: Diagnosis not present

## 2012-03-03 DIAGNOSIS — I739 Peripheral vascular disease, unspecified: Secondary | ICD-10-CM | POA: Insufficient documentation

## 2012-03-03 DIAGNOSIS — Z8249 Family history of ischemic heart disease and other diseases of the circulatory system: Secondary | ICD-10-CM | POA: Diagnosis not present

## 2012-03-03 DIAGNOSIS — I2510 Atherosclerotic heart disease of native coronary artery without angina pectoris: Secondary | ICD-10-CM | POA: Diagnosis not present

## 2012-03-03 DIAGNOSIS — R0602 Shortness of breath: Secondary | ICD-10-CM | POA: Diagnosis not present

## 2012-03-03 DIAGNOSIS — R0989 Other specified symptoms and signs involving the circulatory and respiratory systems: Secondary | ICD-10-CM | POA: Insufficient documentation

## 2012-03-03 MED ORDER — TECHNETIUM TC 99M TETROFOSMIN IV KIT
32.9000 | PACK | Freq: Once | INTRAVENOUS | Status: AC | PRN
  Administered 2012-03-03: 32.9 via INTRAVENOUS

## 2012-03-03 MED ORDER — TECHNETIUM TC 99M TETROFOSMIN IV KIT
11.0000 | PACK | Freq: Once | INTRAVENOUS | Status: AC | PRN
  Administered 2012-03-03: 11 via INTRAVENOUS

## 2012-03-03 MED ORDER — REGADENOSON 0.4 MG/5ML IV SOLN
0.4000 mg | Freq: Once | INTRAVENOUS | Status: AC
  Administered 2012-03-03: 0.4 mg via INTRAVENOUS

## 2012-03-03 NOTE — Progress Notes (Signed)
[facility] SITE 3 NUCLEAR MED [address] [place] [address] [phone]  Cardiology Nuclear Med Study  [name] is a 78 y.o. female     MRN : [id number]     DOB: Aug 03, 1929  Procedure Date: 03/03/2012  Nuclear Med Background Indication for Stress Test:  Evaluation for Ischemia and PTCA Patency History:  '95 PTCA-RCA; '09 QMV:HQIONG, EF=74%; '09 Echo:EF=60%, mild AS. Cardiac Risk Factors: Carotid Disease, Claudication, Family History - CAD, History of Smoking, Hypertension, Lipids, Obesity and PVD  Symptoms:  DOE   Nuclear Pre-Procedure Caffeine/Decaff Intake:  None NPO After: 8:30pm   Lungs:  clear O2 Sat: 96% on room air. IV 0.9% NS with Angio Cath:  20g  IV Site: R Antecubital  IV Started by:  Stanton Kidney, EMT-P  Chest Size (in):  40 Cup Size: B  Height: 5\' 5"  (1.651 m)  Weight:  195 lb (88.451 kg)  BMI:  Body mass index is 32.45 kg/(m^2). Tech Comments:  Meds were taken as directed at 8:00am, per patient.    Nuclear Med Study 1 or 2 day study: 1 day  Stress Test Type:  Lexiscan  Reading MD: Olga Millers, MD  Order Authorizing Provider:  Peter Swaziland, MD  Resting Radionuclide: Technetium 83m Tetrofosmin  Resting Radionuclide Dose: 11.0 mCi   Stress Radionuclide:  Technetium 4m Tetrofosmin  Stress Radionuclide Dose: 32.9 mCi           Stress Protocol Rest HR: 62 Stress HR: 70  Rest BP: 118/66 Stress BP: 130/59  Exercise Time (min): n/a METS: n/a   Predicted Max HR: 140 bpm % Max HR: 50 bpm Rate Pressure Product: 9100   Dose of Adenosine (mg):  n/a Dose of Lexiscan: 0.4 mg  Dose of Atropine (mg): n/a Dose of Dobutamine: n/a mcg/kg/min (at max HR)  Stress Test Technologist: Smiley Houseman, CMA-N  Nuclear Technologist:  Domenic Polite, CNMT     Rest Procedure:  Myocardial perfusion imaging was performed at rest 45 minutes following the intravenous administration of Technetium 3m Tetrofosmin.  Rest ECG: 1st degree AVB.  Stress  Procedure:  The patient received IV Lexiscan 0.4 mg over 15-seconds.  Technetium 70m Tetrofosmin injected at 30-seconds.  There were no significant changes with Lexiscan.  Quantitative spect images were obtained after a 45 minute delay.  Stress ECG: No significant ST segment change suggestive of ischemia.  QPS Raw Data Images:  Acquisition technically good; normal left ventricular size. Stress Images:  There is decreased uptake in the anterior wall. Rest Images:  There is decreased uptake in the anterior wall. Subtraction (SDS):  No evidence of ischemia. Transient Ischemic Dilatation (Normal <1.22):  1.09 Lung/Heart Ratio (Normal <0.45):  0.37  Quantitative Gated Spect Images QGS EDV:  74 ml QGS ESV:  19 ml  Impression Exercise Capacity:  Lexiscan with no exercise. BP Response:  Normal blood pressure response. Clinical Symptoms:  There is dyspnea. ECG Impression:  No significant ST segment change suggestive of ischemia. Comparison with Prior Nuclear Study: Fixed anterior defect new compared to previous study report.  Overall Impression:  Low risk stress nuclear study with a small, mild, fixed anterior defect consistent with small prior anterior infarct vs soft tissue attenuation; no ischemia.  LV Ejection Fraction: 74%.  LV Wall Motion:  NL LV Function; NL Wall Motion  Olga Millers

## 2012-03-04 ENCOUNTER — Telehealth: Payer: Self-pay | Admitting: Cardiology

## 2012-03-05 NOTE — Telephone Encounter (Signed)
error 

## 2012-04-02 ENCOUNTER — Other Ambulatory Visit: Payer: Self-pay | Admitting: Cardiology

## 2012-04-02 MED ORDER — METOPROLOL TARTRATE 50 MG PO TABS: 75.0000 mg | ORAL_TABLET | Freq: Two times a day (BID) | ORAL | Status: DC

## 2012-04-18 DIAGNOSIS — M25569 Pain in unspecified knee: Secondary | ICD-10-CM | POA: Diagnosis not present

## 2012-05-01 DIAGNOSIS — M25569 Pain in unspecified knee: Secondary | ICD-10-CM | POA: Diagnosis not present

## 2012-05-25 ENCOUNTER — Other Ambulatory Visit: Payer: Self-pay

## 2012-05-25 DIAGNOSIS — L259 Unspecified contact dermatitis, unspecified cause: Secondary | ICD-10-CM | POA: Diagnosis not present

## 2012-05-25 DIAGNOSIS — L570 Actinic keratosis: Secondary | ICD-10-CM | POA: Diagnosis not present

## 2012-05-25 DIAGNOSIS — D2330 Other benign neoplasm of skin of unspecified part of face: Secondary | ICD-10-CM | POA: Diagnosis not present

## 2012-05-25 DIAGNOSIS — D485 Neoplasm of uncertain behavior of skin: Secondary | ICD-10-CM | POA: Diagnosis not present

## 2012-06-01 ENCOUNTER — Other Ambulatory Visit: Payer: Self-pay | Admitting: *Deleted

## 2012-06-01 MED ORDER — AMLODIPINE BESYLATE 5 MG PO TABS: 5.0000 mg | ORAL_TABLET | Freq: Every day | ORAL | Status: DC

## 2012-06-10 DIAGNOSIS — Z961 Presence of intraocular lens: Secondary | ICD-10-CM | POA: Diagnosis not present

## 2012-06-10 DIAGNOSIS — H259 Unspecified age-related cataract: Secondary | ICD-10-CM | POA: Diagnosis not present

## 2012-06-10 DIAGNOSIS — H5231 Anisometropia: Secondary | ICD-10-CM | POA: Diagnosis not present

## 2012-06-10 DIAGNOSIS — H1045 Other chronic allergic conjunctivitis: Secondary | ICD-10-CM | POA: Diagnosis not present

## 2012-07-01 ENCOUNTER — Other Ambulatory Visit: Payer: Self-pay | Admitting: Dermatology

## 2012-07-01 DIAGNOSIS — C4441 Basal cell carcinoma of skin of scalp and neck: Secondary | ICD-10-CM | POA: Diagnosis not present

## 2012-07-29 DIAGNOSIS — M25519 Pain in unspecified shoulder: Secondary | ICD-10-CM | POA: Diagnosis not present

## 2012-07-29 DIAGNOSIS — M25569 Pain in unspecified knee: Secondary | ICD-10-CM | POA: Diagnosis not present

## 2012-08-07 DIAGNOSIS — Z23 Encounter for immunization: Secondary | ICD-10-CM | POA: Diagnosis not present

## 2012-08-10 DIAGNOSIS — Z85828 Personal history of other malignant neoplasm of skin: Secondary | ICD-10-CM | POA: Diagnosis not present

## 2012-09-15 ENCOUNTER — Encounter: Payer: Self-pay | Admitting: Neurosurgery

## 2012-09-16 ENCOUNTER — Ambulatory Visit (INDEPENDENT_AMBULATORY_CARE_PROVIDER_SITE_OTHER): Payer: Medicare Other | Admitting: Neurosurgery

## 2012-09-16 ENCOUNTER — Encounter: Payer: Self-pay | Admitting: Neurosurgery

## 2012-09-16 ENCOUNTER — Encounter (INDEPENDENT_AMBULATORY_CARE_PROVIDER_SITE_OTHER): Payer: Medicare Other | Admitting: *Deleted

## 2012-09-16 ENCOUNTER — Ambulatory Visit (INDEPENDENT_AMBULATORY_CARE_PROVIDER_SITE_OTHER): Payer: Medicare Other | Admitting: *Deleted

## 2012-09-16 ENCOUNTER — Other Ambulatory Visit: Payer: Self-pay | Admitting: *Deleted

## 2012-09-16 ENCOUNTER — Ambulatory Visit: Payer: Medicare Other | Admitting: Vascular Surgery

## 2012-09-16 VITALS — BP 95/55 | HR 67 | Resp 16 | Ht 65.0 in | Wt 194.0 lb

## 2012-09-16 DIAGNOSIS — Z48812 Encounter for surgical aftercare following surgery on the circulatory system: Secondary | ICD-10-CM

## 2012-09-16 DIAGNOSIS — M79609 Pain in unspecified limb: Secondary | ICD-10-CM

## 2012-09-16 DIAGNOSIS — I6529 Occlusion and stenosis of unspecified carotid artery: Secondary | ICD-10-CM

## 2012-09-16 DIAGNOSIS — I739 Peripheral vascular disease, unspecified: Secondary | ICD-10-CM

## 2012-09-16 NOTE — Progress Notes (Signed)
[facility] [place] Carotid Office Note  CC: Carotid surveillance with added ABIs Referring Physician: Edilia Bo  History of Present Illness: 78 year old female patient of Dr. Edilia Bo status post right CEA in 2002. The patient denies any signs or symptoms of CVA, TIA, amaurosis fugax or any neural deficit. The patient states she has a history of claudication and does have lower extremity pain now has increased some however she says it is different than when she had a claudication and she feels like maybe orthopedic in nature and he is going to see an orthopedist soon.  Past Medical History  Diagnosis Date  . Hypertension     well controlled  . Hyperlipidemia   . PAD (peripheral artery disease)     with stable claudication  . Hyperthyroidism   . Osteopenia   . Lupus anticoagulant disorder   . History of obesity   . Basal cell cancer     Nose  . B12 deficiency   . Cataract   . First degree atrioventricular block   . Colon polyps   . Coronary artery disease     status post angioplasty of the mid RCA in 1995  . Carotid artery occlusion     ROS: [x]  Positive   [ ]  Denies    General: [ ]  Weight loss, [ ]  Fever, [ ]  chills Neurologic: [ ]  Dizziness, [ ]  Blackouts, [ ]  Seizure [ ]  Stroke, [ ]  "Mini stroke", [ ]  Slurred speech, [ ]  Temporary blindness; [ ]  weakness in arms or legs, [ ]  Hoarseness Cardiac: [ ]  Chest pain/pressure, [ ]  Shortness of breath at rest [ ]  Shortness of breath with exertion, [ ]  Atrial fibrillation or irregular heartbeat Vascular: [ ]  Pain in legs with walking, [ ]  Pain in legs at rest, [ ]  Pain in legs at night,  [ ]  Non-healing ulcer, [ ]  Blood clot in vein/DVT,   Pulmonary: [ ]  Home oxygen, [ ]  Productive cough, [ ]  Coughing up blood, [ ]  Asthma,  [ ]  Wheezing Musculoskeletal:  [ ]  Arthritis, [ ]  Low back pain, [ ]  Joint pain Hematologic: [ ]  Easy Bruising, [ ]  Anemia; [ ]  Hepatitis Gastrointestinal: [ ]  Blood in stool, [ ]   Gastroesophageal Reflux/heartburn, [ ]  Trouble swallowing Urinary: [ ]  chronic Kidney disease, [ ]  on HD - [ ]  MWF or [ ]  TTHS, [ ]  Burning with urination, [ ]  Difficulty urinating Skin: [ ]  Rashes, [ ]  Wounds Psychological: [ ]  Anxiety, [ ]  Depression   Social History History  Substance Use Topics  . Smoking status: Former Smoker -- 1.0 packs/day for 40 years    Types: Cigarettes    Quit date: 11/18/1993  . Smokeless tobacco: Not on file  . Alcohol Use: No    Family History Family History  Problem Relation Age of Onset  . Heart attack Father 41  . Diabetes Sister   . Heart disease Brother     underwent coronary bypass grafting at age 19    No Known Allergies  Current Outpatient Prescriptions  Medication Sig Dispense Refill  . amLODipine (NORVASC) 5 MG tablet Take 1 tablet (5 mg total) by mouth daily.  30 tablet  9  . aspirin 81 MG tablet Take 81 mg by mouth daily.        . cholecalciferol (VITAMIN D) 1000 UNITS tablet Take 1,000 Units by mouth daily.        . clobetasol ointment (TEMOVATE) 0.05 %       .  Coenzyme Q10 (CO Q-10 PO) Take 1 Can by mouth daily.        . Cyanocobalamin (VITAMIN B-12 PO) Take 1 tablet by mouth daily.        . fish oil-omega-3 fatty acids 1000 MG capsule Take 1 g by mouth daily.        Marland Kitchen FOLIC ACID PO Take 1 tablet by mouth daily.        . Insulin Admin Supplies (INSUL-CAP) MISC by Does not apply route.      Marland Kitchen levothyroxine (SYNTHROID, LEVOTHROID) 137 MCG tablet Take 137 mcg by mouth daily.        . metoprolol (LOPRESSOR) 50 MG tablet Take 1.5 tablets (75 mg total) by mouth 2 (two) times daily.  90 tablet  5  . Multiple Vitamins-Minerals (EYE VITAMINS) CAPS Take by mouth.      . niacin (NIASPAN) 1000 MG CR tablet Take 1,000 mg by mouth at bedtime.        . ramipril (ALTACE) 10 MG capsule Take 1 tablet by mouth Daily.      . rosuvastatin (CRESTOR) 20 MG tablet Take 20 mg by mouth daily.        . multivitamin (THERAGRAN) per tablet Take 1 tablet by  mouth daily. Icaps        Physical Examination  Filed Vitals:   09/16/12 1544  BP: 95/55  Pulse: 67  Resp:     Body mass index is 32.28 kg/(m^2).  General:  WDWN in NAD Gait: Normal HEENT: WNL Eyes: Pupils equal Pulmonary: normal non-labored breathing , without Rales, rhonchi,  wheezing Cardiac: RRR, without  Murmurs, rubs or gallops; Abdomen: soft, NT, no masses Skin: no rashes, ulcers noted  Vascular Exam Pulses: 2+ radial pulses bilaterally, PT and DP pulses are not palpable Carotid bruits: Carotid pulses to auscultation no bruits are heard Extremities without ischemic changes, no Gangrene , no cellulitis; no open wounds;  Musculoskeletal: no muscle wasting or atrophy   Neurologic: A&O X 3; Appropriate Affect ; SENSATION: normal; MOTOR FUNCTION:  moving all extremities equally. Speech is fluent/normal  Non-Invasive Vascular Imaging CAROTID DUPLEX 09/16/2012  Right ICA 20 - 39 % stenosis Left ICA 40 - 59 % stenosis ABIs today are 0.55 and monophasic on the right, 0.60 and monophasic on the left which is a slight decreased from 2011  ASSESSMENT/PLAN: Asymptomatic carotid patient with lower extremity pain and claudication is questionable versus orthopedic pain. The patient has opted to come back and see Dr. Edilia Bo in 3 months with ABIs, she is going to see an orthopedist in the interim which will help Dr. Edilia Bo determine if her problem is vascular in nature. Otherwise the patient will return in one year with repeat carotid duplex. Her questions were encouraged and answered, she is in agreement with this plan.  Lauree Chandler ANP   Clinic MD: Hart Rochester on call

## 2012-09-25 DIAGNOSIS — M25569 Pain in unspecified knee: Secondary | ICD-10-CM | POA: Diagnosis not present

## 2012-10-05 DIAGNOSIS — M1710 Unilateral primary osteoarthritis, unspecified knee: Secondary | ICD-10-CM | POA: Diagnosis not present

## 2012-10-07 DIAGNOSIS — M25569 Pain in unspecified knee: Secondary | ICD-10-CM | POA: Diagnosis not present

## 2012-10-21 ENCOUNTER — Telehealth: Payer: Self-pay

## 2012-10-21 ENCOUNTER — Encounter: Payer: Self-pay | Admitting: Cardiology

## 2012-10-21 ENCOUNTER — Ambulatory Visit (INDEPENDENT_AMBULATORY_CARE_PROVIDER_SITE_OTHER): Payer: Medicare Other | Admitting: Cardiology

## 2012-10-21 VITALS — BP 142/60 | HR 73 | Ht 65.0 in | Wt 197.2 lb

## 2012-10-21 DIAGNOSIS — I2510 Atherosclerotic heart disease of native coronary artery without angina pectoris: Secondary | ICD-10-CM | POA: Diagnosis not present

## 2012-10-21 DIAGNOSIS — I739 Peripheral vascular disease, unspecified: Secondary | ICD-10-CM

## 2012-10-21 DIAGNOSIS — E785 Hyperlipidemia, unspecified: Secondary | ICD-10-CM

## 2012-10-21 DIAGNOSIS — I10 Essential (primary) hypertension: Secondary | ICD-10-CM

## 2012-10-21 NOTE — Telephone Encounter (Signed)
Patient had appointment 10/21/12 with Dr.[name].Copy of patient's 10/21/12 EKG,03/03/12 Myoview,10/21/12 office note faxed to [facility] attention Agustin Cree fax # [phone].

## 2012-10-21 NOTE — Patient Instructions (Signed)
Continue your current therapy.  I will see you back in 6 months.   

## 2012-10-21 NOTE — Progress Notes (Signed)
[name] Date of Birth: 03-Sep-1929   History of Present Illness: [name] is seen today for followup. She has a history of coronary disease and had remote angioplasty of the mid right coronary in 1995. She continues to do very well from a cardiac standpoint. She denies any symptoms of chest pain, shortness of breath, or palpitations. She does have a torn meniscus in her left knee and is planning to have arthroscopic surgery next week. On October 30 she had carotid Dopplers done which demonstrated no significant stenosis on the right and a 40-60% stenosis on the left. She also had lower extremity arterial Dopplers which showed ABIs of 0.55 on the right and 0.60 on the left. She denies any significant claudication symptoms. She continues to participate in water aerobics 3 times a week. She's had no symptoms of TIA or stroke.  Current Outpatient Prescriptions on File Prior to Visit  Medication Sig Dispense Refill  . amLODipine (NORVASC) 5 MG tablet Take 1 tablet (5 mg total) by mouth daily.  30 tablet  9  . aspirin 81 MG tablet Take 81 mg by mouth daily.        . cholecalciferol (VITAMIN D) 1000 UNITS tablet Take 1,000 Units by mouth daily.        . clobetasol ointment (TEMOVATE) 0.05 %       . Coenzyme Q10 (CO Q-10 PO) Take 1 Can by mouth daily.        . Cyanocobalamin (VITAMIN B-12 PO) Take 1 tablet by mouth daily.        . fish oil-omega-3 fatty acids 1000 MG capsule Take 1 g by mouth daily.        Marland Kitchen FOLIC ACID PO Take 1 tablet by mouth daily.        Marland Kitchen levothyroxine (SYNTHROID, LEVOTHROID) 137 MCG tablet Take 137 mcg by mouth daily.        . metoprolol (LOPRESSOR) 50 MG tablet Take 1.5 tablets (75 mg total) by mouth 2 (two) times daily.  90 tablet  5  . Multiple Vitamins-Minerals (EYE VITAMINS) CAPS Take by mouth.      . niacin (NIASPAN) 1000 MG CR tablet Take 1,000 mg by mouth at bedtime.        . ramipril (ALTACE) 10 MG capsule Take 1 tablet by mouth Daily.      . rosuvastatin (CRESTOR)  20 MG tablet Take 20 mg by mouth daily.          No Known Allergies  Past Medical History  Diagnosis Date  . Hypertension     well controlled  . Hyperlipidemia   . PAD (peripheral artery disease)     with stable claudication  . Hyperthyroidism   . Osteopenia   . Lupus anticoagulant disorder   . History of obesity   . Basal cell cancer     Nose  . B12 deficiency   . Cataract   . First degree atrioventricular block   . Colon polyps   . Coronary artery disease     status post angioplasty of the mid RCA in 1995  . Carotid artery occlusion     Past Surgical History  Procedure Date  . Endarterectomy     right carotid endarterectomy  . Cholecystectomy 1999  . Repair tendons foot     History  Smoking status  . Former Smoker -- 1.0 packs/day for 40 years  . Types: Cigarettes  . Quit date: 11/18/1993  Smokeless tobacco  . Not on file  History  Alcohol Use No    Family History  Problem Relation Age of Onset  . Heart attack Father 31  . Diabetes Sister   . Heart disease Brother     underwent coronary bypass grafting at age 68    Review of Systems: The review of systems is positive as noted above. All other systems were reviewed and are negative.  Physical Exam: BP 142/60  Pulse 73  Ht 5\' 5"  (1.651 m)  Wt 197 lb 3.2 oz (89.449 kg)  BMI 32.82 kg/m2  SpO2 95% She is an overweight white female in no acute distress. She is normocephalic, atraumatic. Pupils are equal round and reactive. Sclera clear. Oropharynx is clear. Neck is without JVD, adenopathy, thyromegaly. There is a right carotid bruit. She has an old CEA scar. Lungs are clear. Cardiac exam reveals a regular rate and rhythm without a gallop. There is a soft systolic ejection murmur the right upper sternal border. Abdomen is soft and nontender. She has no motion edema. Pedal pulses are diminished. Skin is warm and dry. She is alert oriented x3. Cranial nerves II through XII are intact. LABORATORY  DATA: ECG today demonstrates normal sinus rhythm with first degree AV block. It is otherwise normal.  Assessment / Plan: 1. Coronary disease with remote angioplasty of the right coronary in 1995. Myoview study in April 2013 showed no significant ischemia and ejection fraction of 74%. Continue medical management with aspirin, metoprolol, amlodipine, and ACE inhibitor.  2.  PAD with chronic claudication. ABIs recently demonstrate some decrease in the left leg. She has no new symptoms. We'll continue followup.  3. Carotid arterial disease status post right carotid endarterectomy.  4. Hypertension, controlled.  5. Hyperlipidemia on Crestor, niacin, and fish oil.

## 2012-10-26 DIAGNOSIS — L940 Localized scleroderma [morphea]: Secondary | ICD-10-CM | POA: Diagnosis not present

## 2012-10-26 DIAGNOSIS — R350 Frequency of micturition: Secondary | ICD-10-CM | POA: Diagnosis not present

## 2012-10-26 DIAGNOSIS — M899 Disorder of bone, unspecified: Secondary | ICD-10-CM | POA: Diagnosis not present

## 2012-10-26 DIAGNOSIS — M949 Disorder of cartilage, unspecified: Secondary | ICD-10-CM | POA: Diagnosis not present

## 2012-10-29 DIAGNOSIS — M23359 Other meniscus derangements, posterior horn of lateral meniscus, unspecified knee: Secondary | ICD-10-CM | POA: Diagnosis not present

## 2012-10-29 DIAGNOSIS — M23329 Other meniscus derangements, posterior horn of medial meniscus, unspecified knee: Secondary | ICD-10-CM | POA: Diagnosis not present

## 2012-10-29 DIAGNOSIS — IMO0002 Reserved for concepts with insufficient information to code with codable children: Secondary | ICD-10-CM | POA: Diagnosis not present

## 2012-10-29 DIAGNOSIS — M9420 Chondromalacia, unspecified site: Secondary | ICD-10-CM | POA: Diagnosis not present

## 2012-10-29 DIAGNOSIS — S83289A Other tear of lateral meniscus, current injury, unspecified knee, initial encounter: Secondary | ICD-10-CM | POA: Diagnosis not present

## 2012-10-29 DIAGNOSIS — M2240 Chondromalacia patellae, unspecified knee: Secondary | ICD-10-CM | POA: Diagnosis not present

## 2012-10-29 HISTORY — PX: MENISCUS REPAIR: SHX5179

## 2012-11-06 ENCOUNTER — Other Ambulatory Visit: Payer: Self-pay | Admitting: *Deleted

## 2012-11-06 DIAGNOSIS — I739 Peripheral vascular disease, unspecified: Secondary | ICD-10-CM

## 2012-11-06 DIAGNOSIS — I6529 Occlusion and stenosis of unspecified carotid artery: Secondary | ICD-10-CM

## 2012-11-30 DIAGNOSIS — IMO0002 Reserved for concepts with insufficient information to code with codable children: Secondary | ICD-10-CM | POA: Diagnosis not present

## 2012-11-30 DIAGNOSIS — M2240 Chondromalacia patellae, unspecified knee: Secondary | ICD-10-CM | POA: Diagnosis not present

## 2012-12-03 DIAGNOSIS — IMO0002 Reserved for concepts with insufficient information to code with codable children: Secondary | ICD-10-CM | POA: Diagnosis not present

## 2012-12-03 DIAGNOSIS — M2240 Chondromalacia patellae, unspecified knee: Secondary | ICD-10-CM | POA: Diagnosis not present

## 2012-12-08 DIAGNOSIS — IMO0002 Reserved for concepts with insufficient information to code with codable children: Secondary | ICD-10-CM | POA: Diagnosis not present

## 2012-12-08 DIAGNOSIS — M2240 Chondromalacia patellae, unspecified knee: Secondary | ICD-10-CM | POA: Diagnosis not present

## 2012-12-10 DIAGNOSIS — M2240 Chondromalacia patellae, unspecified knee: Secondary | ICD-10-CM | POA: Diagnosis not present

## 2012-12-10 DIAGNOSIS — IMO0002 Reserved for concepts with insufficient information to code with codable children: Secondary | ICD-10-CM | POA: Diagnosis not present

## 2012-12-15 DIAGNOSIS — M2240 Chondromalacia patellae, unspecified knee: Secondary | ICD-10-CM | POA: Diagnosis not present

## 2012-12-15 DIAGNOSIS — IMO0002 Reserved for concepts with insufficient information to code with codable children: Secondary | ICD-10-CM | POA: Diagnosis not present

## 2012-12-16 ENCOUNTER — Ambulatory Visit: Payer: Medicare Other | Admitting: Vascular Surgery

## 2012-12-22 DIAGNOSIS — M2240 Chondromalacia patellae, unspecified knee: Secondary | ICD-10-CM | POA: Diagnosis not present

## 2012-12-22 DIAGNOSIS — IMO0002 Reserved for concepts with insufficient information to code with codable children: Secondary | ICD-10-CM | POA: Diagnosis not present

## 2012-12-24 DIAGNOSIS — IMO0002 Reserved for concepts with insufficient information to code with codable children: Secondary | ICD-10-CM | POA: Diagnosis not present

## 2012-12-24 DIAGNOSIS — M2240 Chondromalacia patellae, unspecified knee: Secondary | ICD-10-CM | POA: Diagnosis not present

## 2012-12-29 DIAGNOSIS — M2240 Chondromalacia patellae, unspecified knee: Secondary | ICD-10-CM | POA: Diagnosis not present

## 2012-12-29 DIAGNOSIS — IMO0002 Reserved for concepts with insufficient information to code with codable children: Secondary | ICD-10-CM | POA: Diagnosis not present

## 2013-01-04 DIAGNOSIS — M2240 Chondromalacia patellae, unspecified knee: Secondary | ICD-10-CM | POA: Diagnosis not present

## 2013-01-04 DIAGNOSIS — IMO0002 Reserved for concepts with insufficient information to code with codable children: Secondary | ICD-10-CM | POA: Diagnosis not present

## 2013-01-13 DIAGNOSIS — M5030 Other cervical disc degeneration, unspecified cervical region: Secondary | ICD-10-CM | POA: Diagnosis not present

## 2013-01-13 DIAGNOSIS — M6281 Muscle weakness (generalized): Secondary | ICD-10-CM | POA: Diagnosis not present

## 2013-01-13 DIAGNOSIS — R209 Unspecified disturbances of skin sensation: Secondary | ICD-10-CM | POA: Diagnosis not present

## 2013-02-08 ENCOUNTER — Other Ambulatory Visit: Payer: Self-pay | Admitting: Dermatology

## 2013-02-08 DIAGNOSIS — C44519 Basal cell carcinoma of skin of other part of trunk: Secondary | ICD-10-CM | POA: Diagnosis not present

## 2013-02-08 DIAGNOSIS — L570 Actinic keratosis: Secondary | ICD-10-CM | POA: Diagnosis not present

## 2013-02-09 ENCOUNTER — Encounter: Payer: Self-pay | Admitting: Vascular Surgery

## 2013-02-10 ENCOUNTER — Encounter: Payer: Self-pay | Admitting: Vascular Surgery

## 2013-02-10 ENCOUNTER — Ambulatory Visit (INDEPENDENT_AMBULATORY_CARE_PROVIDER_SITE_OTHER): Payer: Medicare Other | Admitting: Vascular Surgery

## 2013-02-10 ENCOUNTER — Encounter (INDEPENDENT_AMBULATORY_CARE_PROVIDER_SITE_OTHER): Payer: Medicare Other | Admitting: Vascular Surgery

## 2013-02-10 VITALS — BP 126/63 | HR 72 | Ht 65.0 in | Wt 197.0 lb

## 2013-02-10 DIAGNOSIS — I70219 Atherosclerosis of native arteries of extremities with intermittent claudication, unspecified extremity: Secondary | ICD-10-CM | POA: Diagnosis not present

## 2013-02-10 DIAGNOSIS — I739 Peripheral vascular disease, unspecified: Secondary | ICD-10-CM | POA: Diagnosis not present

## 2013-02-10 NOTE — Progress Notes (Signed)
[facility]  Patient name: [name] MRN: [id number] DOB: Sep 08, 1929 Sex: female  REASON FOR VISIT: Left leg pain.  HPI: [name] is a 79 y.o. female with known infrainguinal arterial occlusive disease. She developed some pain along the anterior aspect of her left leg which occurs with ambulation and is relieved somewhat with rest. She does not describe significant calf claudication. She has no symptoms on the left side. She denies any history of rest pain or history of nonhealing ulcers. She denies any history of DVT. She does have some chronic venous insufficiency. In addition she did have a meniscus injury repaired on her left knee. Her symptoms in the left leg have been stable with no other aggravating or alleviating factors.  Her risk factors for peripheral vascular disease include hypertension and hypercholesterolemia. She quit tobacco in 1995.  Past Medical History  Diagnosis Date  . Hypertension     well controlled  . Hyperlipidemia   . PAD (peripheral artery disease)     with stable claudication  . Hyperthyroidism   . Osteopenia   . Lupus anticoagulant disorder   . History of obesity   . Basal cell cancer     Nose  . B12 deficiency   . Cataract   . First degree atrioventricular block   . Colon polyps   . Coronary artery disease     status post angioplasty of the mid RCA in 1995  . Carotid artery occlusion     Family History  Problem Relation Age of Onset  . Heart attack Father 87  . Diabetes Sister   . Heart disease Brother     underwent coronary bypass grafting at age 16    SOCIAL HISTORY: History  Substance Use Topics  . Smoking status: Former Smoker -- 1.00 packs/day for 40 years    Types: Cigarettes    Quit date: 11/18/1993  . Smokeless tobacco: Not on file  . Alcohol Use: No    No Known Allergies  Current Outpatient Prescriptions  Medication Sig Dispense Refill  . amLODipine (NORVASC) 5 MG tablet Take 1  tablet (5 mg total) by mouth daily.  30 tablet  9  . aspirin 81 MG tablet Take 81 mg by mouth daily.        . cholecalciferol (VITAMIN D) 1000 UNITS tablet Take 1,000 Units by mouth daily.        . Coenzyme Q10 (CO Q-10 PO) Take 1 Can by mouth daily.        . Cyanocobalamin (VITAMIN B-12 PO) Take 1 tablet by mouth daily.        . fish oil-omega-3 fatty acids 1000 MG capsule Take 1 g by mouth daily.        Marland Kitchen FOLIC ACID PO Take 1 tablet by mouth daily.        Marland Kitchen levothyroxine (SYNTHROID, LEVOTHROID) 137 MCG tablet Take 137 mcg by mouth daily.        . metoprolol (LOPRESSOR) 50 MG tablet Take 1.5 tablets (75 mg total) by mouth 2 (two) times daily.  90 tablet  5  . Multiple Vitamins-Minerals (EYE VITAMINS) CAPS Take by mouth.      . niacin (NIASPAN) 1000 MG CR tablet Take 1,000 mg by mouth at bedtime.        . ramipril (ALTACE) 10 MG capsule Take 1 tablet by mouth Daily.      . rosuvastatin (CRESTOR) 20 MG tablet Take 20 mg by mouth daily.        Marland Kitchen  clobetasol ointment (TEMOVATE) 0.05 %        No current facility-administered medications for this visit.    REVIEW OF SYSTEMS: Arly.Keller ] denotes positive finding; [  ] denotes negative finding  CARDIOVASCULAR:  [ ]  chest pain   [ ]  chest pressure   [ ]  palpitations   [ ]  orthopnea   [ ]  dyspnea on exertion   [ ]  claudication   [ ]  rest pain   [ ]  DVT   [ ]  phlebitis PULMONARY:   [ ]  productive cough   [ ]  asthma   [ ]  wheezing NEUROLOGIC:   [ ]  weakness  [ ]  paresthesias  [ ]  aphasia  [ ]  amaurosis  [ ]  dizziness HEMATOLOGIC:   [ ]  bleeding problems   [ ]  clotting disorders MUSCULOSKELETAL:  [ ]  joint pain   [ ]  joint swelling [ ]  leg swelling GASTROINTESTINAL: [ ]   blood in stool  [ ]   hematemesis GENITOURINARY:  [ ]   dysuria  [ ]   hematuria PSYCHIATRIC:  [ ]  history of major depression INTEGUMENTARY:  [ ]  rashes  [ ]  ulcers CONSTITUTIONAL:  [ ]  fever   [ ]  chills  PHYSICAL EXAM: Filed Vitals:   02/10/13 1043  BP: 126/63  Pulse: 72  Height: 5'  5" (1.651 m)  Weight: 197 lb (89.359 kg)  SpO2: 96%   Body mass index is 32.78 kg/(m^2). GENERAL: The patient is a well-nourished female, in no acute distress. The vital signs are documented above. CARDIOVASCULAR: There is a regular rate and rhythm. I do not detect carotid bruits. She has palpable femoral pulses. I cannot palpate popliteal or pedal pulses. PULMONARY: There is good air exchange bilaterally without wheezing or rales. ABDOMEN: Soft and non-tender with normal pitched bowel sounds.  MUSCULOSKELETAL: There are no major deformities or cyanosis. NEUROLOGIC: No focal weakness or paresthesias are detected. SKIN: There are no ulcers or rashes noted. PSYCHIATRIC: The patient has a normal affect.  DATA:  I have independently interpreted her arterial Doppler study which shows an ABI of 55% on the right and 54% on the left. She has monophasic Doppler signals in the right foot in the dorsalis pedis and posterior tibial positions. On the left side she has a biphasic dorsalis pedis signal with a monophasic posterior tibial signal. I have compared this to the patient's previous Doppler study which showed an ABI of 55% on the right and 60% on the left. Thus there has been no significant change compared this study done on 09/16/2012.  He also had a carotid duplex scan done in October of 2013 which showed a patent right carotid endarterectomy site with a 40-59% left internal carotid artery stenosis.  MEDICAL ISSUES: With respect to her carotid disease she is scheduled for a follow carotid duplex scan in October 2014. She is asymptomatic in this should simply be followed at yearly basis. She does know to continue taking her aspirin. She is also on Crestor for lipids.  With respect to her left leg pain, her peripheral vascular disease remained stable and she has no symptoms in the right leg. Therefore not convinced her symptoms are from her infrainguinal arterial occlusive disease. His symptoms are  mostly in the anterior leg. 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 Maryclare Labrador continue follow her on a yearly basis. She knows to call  sooner she has problems.  Norelle Runnion S [facility] [place] [facility]: [phone]

## 2013-03-08 ENCOUNTER — Other Ambulatory Visit (HOSPITAL_COMMUNITY): Payer: Self-pay | Admitting: Internal Medicine

## 2013-03-08 DIAGNOSIS — Z1231 Encounter for screening mammogram for malignant neoplasm of breast: Secondary | ICD-10-CM

## 2013-03-11 DIAGNOSIS — M949 Disorder of cartilage, unspecified: Secondary | ICD-10-CM | POA: Diagnosis not present

## 2013-03-11 DIAGNOSIS — R7301 Impaired fasting glucose: Secondary | ICD-10-CM | POA: Diagnosis not present

## 2013-03-11 DIAGNOSIS — R809 Proteinuria, unspecified: Secondary | ICD-10-CM | POA: Diagnosis not present

## 2013-03-11 DIAGNOSIS — M899 Disorder of bone, unspecified: Secondary | ICD-10-CM | POA: Diagnosis not present

## 2013-03-11 DIAGNOSIS — E538 Deficiency of other specified B group vitamins: Secondary | ICD-10-CM | POA: Diagnosis not present

## 2013-03-11 DIAGNOSIS — E039 Hypothyroidism, unspecified: Secondary | ICD-10-CM | POA: Diagnosis not present

## 2013-03-11 DIAGNOSIS — R82998 Other abnormal findings in urine: Secondary | ICD-10-CM | POA: Diagnosis not present

## 2013-03-11 DIAGNOSIS — I10 Essential (primary) hypertension: Secondary | ICD-10-CM | POA: Diagnosis not present

## 2013-03-11 DIAGNOSIS — I2510 Atherosclerotic heart disease of native coronary artery without angina pectoris: Secondary | ICD-10-CM | POA: Diagnosis not present

## 2013-03-15 ENCOUNTER — Ambulatory Visit (HOSPITAL_COMMUNITY)
Admission: RE | Admit: 2013-03-15 | Discharge: 2013-03-15 | Disposition: A | Discharge status: Home / Self Care | Payer: Medicare Other | Source: Ambulatory Visit | Attending: Internal Medicine | Admitting: Internal Medicine

## 2013-03-15 DIAGNOSIS — Z1231 Encounter for screening mammogram for malignant neoplasm of breast: Secondary | ICD-10-CM

## 2013-03-23 DIAGNOSIS — Z0000 Encounter for general adult medical examination without abnormal findings: Secondary | ICD-10-CM | POA: Diagnosis not present

## 2013-03-23 DIAGNOSIS — E785 Hyperlipidemia, unspecified: Secondary | ICD-10-CM | POA: Diagnosis not present

## 2013-03-23 DIAGNOSIS — N182 Chronic kidney disease, stage 2 (mild): Secondary | ICD-10-CM | POA: Diagnosis not present

## 2013-03-23 DIAGNOSIS — E669 Obesity, unspecified: Secondary | ICD-10-CM | POA: Diagnosis not present

## 2013-03-23 DIAGNOSIS — R809 Proteinuria, unspecified: Secondary | ICD-10-CM | POA: Diagnosis not present

## 2013-03-23 DIAGNOSIS — Z23 Encounter for immunization: Secondary | ICD-10-CM | POA: Diagnosis not present

## 2013-03-23 DIAGNOSIS — I2510 Atherosclerotic heart disease of native coronary artery without angina pectoris: Secondary | ICD-10-CM | POA: Diagnosis not present

## 2013-03-23 DIAGNOSIS — I10 Essential (primary) hypertension: Secondary | ICD-10-CM | POA: Diagnosis not present

## 2013-03-23 DIAGNOSIS — D649 Anemia, unspecified: Secondary | ICD-10-CM | POA: Diagnosis not present

## 2013-03-25 DIAGNOSIS — D485 Neoplasm of uncertain behavior of skin: Secondary | ICD-10-CM | POA: Diagnosis not present

## 2013-03-25 DIAGNOSIS — Z85828 Personal history of other malignant neoplasm of skin: Secondary | ICD-10-CM | POA: Diagnosis not present

## 2013-04-20 ENCOUNTER — Other Ambulatory Visit: Payer: Self-pay | Admitting: Cardiology

## 2013-04-20 NOTE — Telephone Encounter (Signed)
amLODipine (NORVASC) 5 MG tablet  Take 1 tablet (5 mg total) by mouth daily.   30 tablet   9  Patient Instructions  Continue your current therapy. I will see you back in 6 months. Chart Reviewed By  Charna Elizabeth, LPN  on 40/07/8118  2:49 PM

## 2013-04-23 ENCOUNTER — Other Ambulatory Visit: Payer: Self-pay

## 2013-04-23 MED ORDER — METOPROLOL TARTRATE 50 MG PO TABS: 75.0000 mg | ORAL_TABLET | Freq: Two times a day (BID) | ORAL | Status: DC

## 2013-04-23 NOTE — Telephone Encounter (Signed)
metoprolol (LOPRESSOR) 50 MG tablet  Take 1.5 tablets (75 mg total) by mouth 2 (two) times daily.   90 tablet

## 2013-04-28 ENCOUNTER — Ambulatory Visit (INDEPENDENT_AMBULATORY_CARE_PROVIDER_SITE_OTHER): Payer: Medicare Other | Admitting: Cardiology

## 2013-04-28 ENCOUNTER — Encounter: Payer: Self-pay | Admitting: Cardiology

## 2013-04-28 VITALS — BP 122/68 | HR 77 | Ht 65.0 in | Wt 195.4 lb

## 2013-04-28 DIAGNOSIS — I739 Peripheral vascular disease, unspecified: Secondary | ICD-10-CM

## 2013-04-28 DIAGNOSIS — I2510 Atherosclerotic heart disease of native coronary artery without angina pectoris: Secondary | ICD-10-CM

## 2013-04-28 DIAGNOSIS — E785 Hyperlipidemia, unspecified: Secondary | ICD-10-CM | POA: Diagnosis not present

## 2013-04-28 DIAGNOSIS — I10 Essential (primary) hypertension: Secondary | ICD-10-CM | POA: Diagnosis not present

## 2013-04-28 MED ORDER — NITROGLYCERIN 0.4 MG SL SUBL: 0.4000 mg | SUBLINGUAL_TABLET | SUBLINGUAL | Status: DC | PRN

## 2013-04-28 NOTE — Progress Notes (Signed)
[name] Date of Birth: 77/16/1930   History of Present Illness: [name] is seen today for followup. She has a history of coronary disease and had remote angioplasty of the mid right coronary in 1995. She continues to do very well from a cardiac standpoint. She denies any symptoms of chest pain, shortness of breath, or palpitations. She does complain of some pain in her left leg from the knee down. She was assessed by Dr. Edilia Bo who found that her ABIs were stable. She did have left knee surgery last year. She denies any right leg claudication. She continues to exercise regularly.  Current Outpatient Prescriptions on File Prior to Visit  Medication Sig Dispense Refill  . amLODipine (NORVASC) 5 MG tablet TAKE 1 TABLET (5 MG TOTAL) BY MOUTH DAILY.  30 tablet  3  . aspirin 81 MG tablet Take 81 mg by mouth daily.        . cholecalciferol (VITAMIN D) 1000 UNITS tablet Take 1,000 Units by mouth daily.        Marland Kitchen FOLIC ACID PO Take 1 tablet by mouth daily.        Marland Kitchen levothyroxine (SYNTHROID, LEVOTHROID) 137 MCG tablet Take 137 mcg by mouth daily.        . metoprolol (LOPRESSOR) 50 MG tablet Take 1.5 tablets (75 mg total) by mouth 2 (two) times daily.  90 tablet  3  . niacin (NIASPAN) 1000 MG CR tablet Take 1,000 mg by mouth at bedtime.        . ramipril (ALTACE) 10 MG capsule Take 1 tablet by mouth Daily.      . rosuvastatin (CRESTOR) 20 MG tablet Take 20 mg by mouth daily.        . Multiple Vitamins-Minerals (EYE VITAMINS) CAPS Take by mouth.       No current facility-administered medications on file prior to visit.    No Known Allergies  Past Medical History  Diagnosis Date  . Hypertension     well controlled  . Hyperlipidemia   . PAD (peripheral artery disease)     with stable claudication  . Hyperthyroidism   . Osteopenia   . Lupus anticoagulant disorder   . History of obesity   . Basal cell cancer     Nose  . B12 deficiency   . Cataract   . First degree atrioventricular  block   . Colon polyps   . Coronary artery disease     status post angioplasty of the mid RCA in 1995  . Carotid artery occlusion     Past Surgical History  Procedure Laterality Date  . Endarterectomy      right carotid endarterectomy  . Cholecystectomy  1999  . Repair tendons foot    . Meniscus repair Left 10/29/2012    History  Smoking status  . Former Smoker -- 1.00 packs/day for 40 years  . Types: Cigarettes  . Quit date: 11/18/1993  Smokeless tobacco  . Not on file    History  Alcohol Use No    Family History  Problem Relation Age of Onset  . Heart attack Father 20  . Diabetes Sister   . Heart disease Brother     underwent coronary bypass grafting at age 64    Review of Systems: The review of systems is positive as noted above. All other systems were reviewed and are negative.  Physical Exam: BP 122/68  Pulse 77  Ht 5\' 5"  (1.651 m)  Wt 195 lb 6.4 oz (88.633  kg)  BMI 32.52 kg/m2  SpO2 92% She is an overweight white female in no acute distress. She is normocephalic, atraumatic. Pupils are equal round and reactive. Sclera clear. Oropharynx is clear. Neck is without JVD, adenopathy, thyromegaly. There is a right carotid bruit. She has an old CEA scar. Lungs are clear. Cardiac exam reveals a regular rate and rhythm without a gallop. There is a soft systolic ejection murmur the right upper sternal border. Abdomen is soft and nontender. She has no motion edema. Pedal pulses are diminished. Skin is warm and dry. She is alert oriented x3. Cranial nerves II through XII are intact. LABORATORY DATA: Labs done on 03/11/2013 show a normal chemistry panel. CBC is normal. Total cholesterol 143, triglycerides 143, HDL 53, LDL 61.  Assessment / Plan: 1. Coronary disease with remote angioplasty of the right coronary in 1995. Myoview study in April 2013 showed no significant ischemia and ejection fraction of 74%. Continue medical management with aspirin, metoprolol, amlodipine,  and ACE inhibitor.  2.  PAD with chronic claudication. ABIs are stable per [facility].   3. Carotid arterial disease status post right carotid endarterectomy.  4. Hypertension, controlled.  5. Hyperlipidemia well controlled on Crestor, niacin, and fish oil.

## 2013-04-28 NOTE — Patient Instructions (Signed)
Continue your current therapy  I will see you in 6 months.   

## 2013-05-19 ENCOUNTER — Ambulatory Visit: Payer: Self-pay | Admitting: Gynecology

## 2013-05-20 ENCOUNTER — Ambulatory Visit (INDEPENDENT_AMBULATORY_CARE_PROVIDER_SITE_OTHER): Payer: Medicare Other | Admitting: Gynecology

## 2013-05-20 ENCOUNTER — Encounter: Payer: Self-pay | Admitting: Gynecology

## 2013-05-20 VITALS — BP 140/64 | Ht 65.25 in | Wt 197.0 lb

## 2013-05-20 DIAGNOSIS — Q525 Fusion of labia: Secondary | ICD-10-CM

## 2013-05-20 DIAGNOSIS — R82998 Other abnormal findings in urine: Secondary | ICD-10-CM | POA: Diagnosis not present

## 2013-05-20 DIAGNOSIS — Q519 Congenital malformation of uterus and cervix, unspecified: Secondary | ICD-10-CM

## 2013-05-20 DIAGNOSIS — N9489 Other specified conditions associated with female genital organs and menstrual cycle: Secondary | ICD-10-CM

## 2013-05-20 DIAGNOSIS — N952 Postmenopausal atrophic vaginitis: Secondary | ICD-10-CM | POA: Diagnosis not present

## 2013-05-20 DIAGNOSIS — Q5270 Unspecified congenital malformations of vulva: Secondary | ICD-10-CM

## 2013-05-20 DIAGNOSIS — N949 Unspecified condition associated with female genital organs and menstrual cycle: Secondary | ICD-10-CM

## 2013-05-20 LAB — URINALYSIS W MICROSCOPIC + REFLEX CULTURE
Casts: NONE SEEN
Hgb urine dipstick: NEGATIVE
Ketones, ur: NEGATIVE mg/dL
Nitrite: NEGATIVE
Protein, ur: NEGATIVE mg/dL
pH: 5.5 (ref 5.0–8.0)

## 2013-05-20 LAB — WET PREP FOR TRICH, YEAST, CLUE
Trich, Wet Prep: NONE SEEN
WBC, Wet Prep HPF POC: NONE SEEN
Yeast Wet Prep HPF POC: NONE SEEN

## 2013-05-20 MED ORDER — NONFORMULARY OR COMPOUNDED ITEM: Status: DC

## 2013-05-20 NOTE — Patient Instructions (Signed)
Atrophic Vaginitis Atrophic vaginitis is a problem of low levels of estrogen in women. This problem can happen at any age. It is most common in women who have gone through menopause ("the change").  HOW WILL I KNOW IF I HAVE THIS PROBLEM? You may have:  Trouble with peeing (urinating), such as:  Going to the bathroom often.  A hard time holding your pee until you reach a bathroom.  Leaking pee.  Having pain when you pee.  Itching or a burning feeling.  Vaginal bleeding and spotting.  Pain during sex.  Dryness of the vagina.  A yellow, bad-smelling fluid (discharge) coming from the vagina. HOW WILL MY DOCTOR CHECK FOR THIS PROBLEM?  During your exam, your doctor will likely find the problem.  If there is a vaginal fluid, it may be checked for infection. HOW WILL THIS PROBLEM BE TREATED? Keep the vulvar skin as clean as possible. Moisturizers and lubricants can help with some of the symptoms. Estrogen replacement can help. There are 2 ways to take estrogen:  Systemic estrogen gets estrogen to your whole body. It takes many weeks or months before the symptoms get better.  You take an estrogen pill.  You use a skin patch. This is a patch that you put on your skin.  If you still have your uterus, your doctor may ask you to take a hormone. Talk to your doctor about the right medicine for you.  Estrogen cream.  This puts estrogen only at the part of your body where you apply it. The cream is put into the vagina or put on the vulvar skin. For some women, estrogen cream works faster than pills or the patch. CAN ALL WOMEN WITH THIS PROBLEM USE ESTROGEN? No. Women with certain types of cancer, liver problems, or problems with blood clots should not take estrogen. Your doctor can help you decide the best treatment for your symptoms. Document Released: 04/22/2008 Document Revised: 01/27/2012 Document Reviewed: 04/22/2008 [employer] Patient Information 2014 [employer].  

## 2013-05-20 NOTE — Progress Notes (Signed)
The patient is an 79 year old new patient to the practice who was formally been followed by Dr. Nicholas Lose who has retired. Patient presented to the office today with complaint of vaginal burning and irritation. Patient has had history in the past the labial agglutination. Patient not sexually active.  Exam: Agglutination of the labia minora was noted as well as atrophic changes on the external genitalia and vagina. No cervical lesions were noted or discharge. Vaginal atrophy was evident.  Wet prep was negative Urinalysis 7-10 WBCs few bacteria urine submitted for culture  Assessment/plan: Labia minora agglutination, atrophic vaginitis. Patient will be placed on low dose estrogen vaginal cream to apply each bedtime for one week followed by placement of twice a week application (0.02%). Risks benefits and pros and cons were discussed. Patient otherwise scheduled to return back in October for for gynecological exam. We will await further results of the urine culture and notified accordingly.

## 2013-05-22 LAB — URINE CULTURE: Colony Count: NO GROWTH

## 2013-06-25 DIAGNOSIS — H2640 Unspecified secondary cataract: Secondary | ICD-10-CM | POA: Diagnosis not present

## 2013-06-25 DIAGNOSIS — H04129 Dry eye syndrome of unspecified lacrimal gland: Secondary | ICD-10-CM | POA: Diagnosis not present

## 2013-06-25 DIAGNOSIS — H259 Unspecified age-related cataract: Secondary | ICD-10-CM | POA: Diagnosis not present

## 2013-06-28 DIAGNOSIS — L259 Unspecified contact dermatitis, unspecified cause: Secondary | ICD-10-CM | POA: Diagnosis not present

## 2013-06-28 DIAGNOSIS — Z85828 Personal history of other malignant neoplasm of skin: Secondary | ICD-10-CM | POA: Diagnosis not present

## 2013-08-16 ENCOUNTER — Other Ambulatory Visit: Payer: Self-pay | Admitting: Cardiology

## 2013-08-21 ENCOUNTER — Other Ambulatory Visit: Payer: Self-pay | Admitting: Cardiology

## 2013-08-26 DIAGNOSIS — Z23 Encounter for immunization: Secondary | ICD-10-CM | POA: Diagnosis not present

## 2013-09-14 ENCOUNTER — Encounter: Payer: Self-pay | Admitting: Family

## 2013-09-15 ENCOUNTER — Ambulatory Visit (HOSPITAL_COMMUNITY)
Admission: RE | Admit: 2013-09-15 | Discharge: 2013-09-15 | Disposition: A | Discharge status: Home / Self Care | Payer: Medicare Other | Source: Ambulatory Visit | Attending: Family | Admitting: Family

## 2013-09-15 ENCOUNTER — Other Ambulatory Visit: Payer: Medicare Other

## 2013-09-15 ENCOUNTER — Ambulatory Visit: Payer: Medicare Other | Admitting: Neurosurgery

## 2013-09-15 ENCOUNTER — Encounter: Payer: Self-pay | Admitting: Family

## 2013-09-15 ENCOUNTER — Ambulatory Visit (INDEPENDENT_AMBULATORY_CARE_PROVIDER_SITE_OTHER): Payer: Medicare Other | Admitting: Family

## 2013-09-15 DIAGNOSIS — I6529 Occlusion and stenosis of unspecified carotid artery: Secondary | ICD-10-CM

## 2013-09-15 DIAGNOSIS — I739 Peripheral vascular disease, unspecified: Secondary | ICD-10-CM | POA: Diagnosis not present

## 2013-09-15 DIAGNOSIS — Z48812 Encounter for surgical aftercare following surgery on the circulatory system: Secondary | ICD-10-CM | POA: Diagnosis not present

## 2013-09-15 NOTE — Patient Instructions (Signed)
Peripheral Vascular Disease Peripheral Vascular Disease (PVD), also called Peripheral Arterial Disease (PAD), is a circulation problem caused by cholesterol (atherosclerotic plaque) deposits in the arteries. PVD commonly occurs in the lower extremities (legs) but it can occur in other areas of the body, such as your arms. The cholesterol buildup in the arteries reduces blood flow which can cause pain and other serious problems. The presence of PVD can place a person at risk for Coronary Artery Disease (CAD).  CAUSES  Causes of PVD can be many. It is usually associated with more than one risk factor such as:   High Cholesterol.  Smoking.  Diabetes.  Lack of exercise or inactivity.  High blood pressure (hypertension).  Obesity.  Family history. SYMPTOMS   When the lower extremities are affected, patients with PVD may experience:  Leg pain with exertion or physical activity. This is called INTERMITTENT CLAUDICATION. This may present as cramping or numbness with physical activity. The location of the pain is associated with the level of blockage. For example, blockage at the abdominal level (distal abdominal aorta) may result in buttock or hip pain. Lower leg arterial blockage may result in calf pain.  As PVD becomes more severe, pain can develop with less physical activity.  In people with severe PVD, leg pain may occur at rest.  Other PVD signs and symptoms:  Leg numbness or weakness.  Coldness in the affected leg or foot, especially when compared to the other leg.  A change in leg color.  Patients with significant PVD are more prone to ulcers or sores on toes, feet or legs. These may take longer to heal or may reoccur. The ulcers or sores can become infected.  If signs and symptoms of PVD are ignored, gangrene may occur. This can result in the loss of toes or loss of an entire limb.  Not all leg pain is related to PVD. Other medical conditions can cause leg pain such  as:  Blood clots (embolism) or Deep Vein Thrombosis.  Inflammation of the blood vessels (vasculitis).  Spinal stenosis. DIAGNOSIS  Diagnosis of PVD can involve several different types of tests. These can include:  Pulse Volume Recording Method (PVR). This test is simple, painless and does not involve the use of X-rays. PVR involves measuring and comparing the blood pressure in the arms and legs. An ABI (Ankle-Brachial Index) is calculated. The normal ratio of blood pressures is 1. As this number becomes smaller, it indicates more severe disease.  < 0.95  indicates significant narrowing in one or more leg vessels.  <0.8 there will usually be pain in the foot, leg or buttock with exercise.  <0.4 will usually have pain in the legs at rest.  <0.25  usually indicates limb threatening PVD.  Doppler detection of pulses in the legs. This test is painless and checks to see if you have a pulses in your legs/feet.  A dye or contrast material (a substance that highlights the blood vessels so they show up on x-ray) may be given to help your caregiver better see the arteries for the following tests. The dye is eliminated from your body by the kidney's. Your caregiver may order blood work to check your kidney function and other laboratory values before the following tests are performed:  Magnetic Resonance Angiography (MRA). An MRA is a picture study of the blood vessels and arteries. The MRA machine uses a large magnet to produce images of the blood vessels.  Computed Tomography Angiography (CTA). A CTA is a   specialized x-ray that looks at how the blood flows in your blood vessels. An IV may be inserted into your arm so contrast dye can be injected.  Angiogram. Is a procedure that uses x-rays to look at your blood vessels. This procedure is minimally invasive, meaning a small incision (cut) is made in your groin. A small tube (catheter) is then inserted into the artery of your groin. The catheter is  guided to the blood vessel or artery your caregiver wants to examine. Contrast dye is injected into the catheter. X-rays are then taken of the blood vessel or artery. After the images are obtained, the catheter is taken out. TREATMENT  Treatment of PVD involves many interventions which may include:  Lifestyle changes:  Quitting smoking.  Exercise.  Following a low fat, low cholesterol diet.  Control of diabetes.  Foot care is very important to the PVD patient. Good foot care can help prevent infection.  Medication:  Cholesterol-lowering medicine.  Blood pressure medicine.  Anti-platelet drugs.  Certain medicines may reduce symptoms of Intermittent Claudication.  Interventional/Surgical options:  Angioplasty. An Angioplasty is a procedure that inflates a balloon in the blocked artery. This opens the blocked artery to improve blood flow.  Stent Implant. A wire mesh tube (stent) is placed in the artery. The stent expands and stays in place, allowing the artery to remain open.  Peripheral Bypass Surgery. This is a surgical procedure that reroutes the blood around a blocked artery to help improve blood flow. This type of procedure may be performed if Angioplasty or stent implants are not an option. SEEK IMMEDIATE MEDICAL CARE IF:   You develop pain or numbness in your arms or legs.  Your arm or leg turns cold, becomes blue in color.  You develop redness, warmth, swelling and pain in your arms or legs. MAKE SURE YOU:   Understand these instructions.  Will watch your condition.  Will get help right away if you are not doing well or get worse. Document Released: 12/12/2004 Document Revised: 01/27/2012 Document Reviewed: 11/08/2008 [employer] Patient Information 2014 [employer].   Stroke Prevention Some medical conditions and behaviors are associated with an increased chance of having a stroke. You may prevent a stroke by making healthy choices and managing medical  conditions. Reduce your risk of having a stroke by:  Staying physically active. Get at least 30 minutes of activity on most or all days.  Not smoking. It may also be helpful to avoid exposure to secondhand smoke.  Limiting alcohol use. Moderate alcohol use is considered to be:  No more than 2 drinks per day for men.  No more than 1 drink per day for nonpregnant women.  Eating healthy foods.  Include 5 or more servings of fruits and vegetables a day.  Certain diets may be prescribed to address high blood pressure, high cholesterol, diabetes, or obesity.  Managing your cholesterol levels.  A low-saturated fat, low-trans fat, low-cholesterol, and high-fiber diet may control cholesterol levels.  Take any prescribed medicines to control cholesterol as directed by your caregiver.  Managing your diabetes.  A controlled-carbohydrate, controlled-sugar diet is recommended to manage diabetes.  Take any prescribed medicines to control diabetes as directed by your caregiver.  Controlling your high blood pressure (hypertension).  A low-salt (sodium), low-saturated fat, low-trans fat, and low-cholesterol diet is recommended to manage high blood pressure.  Take any prescribed medicines to control hypertension as directed by your caregiver.  Maintaining a healthy weight.  A reduced-calorie, low-sodium, low-saturated fat, low-trans   fat, low-cholesterol diet is recommended to manage weight.  Stopping drug abuse.  Avoiding birth control pills.  Talk to your caregiver about the risks of taking birth control pills if you are over 35 years old, smoke, get migraines, or have ever had a blood clot.  Getting evaluated for sleep disorders (sleep apnea).  Talk to your caregiver about getting a sleep evaluation if you snore a lot or have excessive sleepiness.  Taking medicines as directed by your caregiver.  For some people, aspirin or blood thinners (anticoagulants) are helpful in reducing  the risk of forming abnormal blood clots that can lead to stroke. If you have the irregular heart rhythm of atrial fibrillation, you should be on a blood thinner unless there is a good reason you cannot take them.  Understand all your medicine instructions. SEEK IMMEDIATE MEDICAL CARE IF:   You have sudden weakness or numbness of the face, arm, or leg, especially on one side of the body.  You have sudden confusion.  You have trouble speaking (aphasia) or understanding.  You have sudden trouble seeing in one or both eyes.  You have sudden trouble walking.  You have dizziness.  You have a loss of balance or coordination.  You have a sudden, severe headache with no known cause.  You have new chest pain or an irregular heartbeat. Any of these symptoms may represent a serious problem that is an emergency. Do not wait to see if the symptoms will go away. Get medical help right away. Call your local emergency services (911 in [place] Do not drive yourself to the hospital. Document Released: 12/12/2004 Document Revised: 01/27/2012 Document Reviewed: 06/24/2011 [employer] Patient Information 2014 [employer].  

## 2013-09-15 NOTE — Progress Notes (Signed)
[facility] [place] HISTORY AND PHYSICAL   MRN : [id number]  History of Present Illness:   [name] is a 79 y.o. female patient that Dr. Edilia Bo has been following status post right CEA on 03/09/2001 and for PAD. At her visit with Dr. Edilia Bo March, 2014, her arterial Doppler study showed an ABI of 55% on the right and 54% on the left. She had monophasic Doppler signals in the right foot in the dorsalis pedis and posterior tibial positions. On the left side she had biphasic dorsalis pedis signal with a monophasic posterior tibial signal; compared to the patient's previous Doppler study which showed an ABI of 55% on the right and 60% on the left. Thus there had been no significant change compared with the study done on 09/16/2012.  She also had a carotid duplex scan done in October of 2013 which showed a patent right carotid endarterectomy site with a 40-59% left internal carotid artery stenosis; she had been asymptomatic in regards to this. With respect to her left leg pain, her peripheral vascular disease remained stable and she had no symptoms in the right leg. Therefore Dr. Edilia Bo was not convinced that her symptoms were from her infrainguinal arterial occlusive disease. Her symptoms were mostly in the anterior leg. She did have evidence of chronic venous insufficiency and she was encouraged to continue her water aerobics and also to elevate her legs intermittently.  Patient is a retired Char[employer] fundraiser and states she has known left subclavian stenosis but has no steal symptoms in the left arm/hand. Had torn meniscus in left knee, was repaired last Dec., and left leg feels better. Left anterior/lateral calf pain with walking about 200 feet, improves with rest and does not occur if she walks with right foot adducted; she connects this pain to a twisting injury of her left foot years ago. She states this is very mild and does not deter her form any activity. She has no claudication  symptoms in her right leg.   Pt. denies rest pain; denies night pain denies non healing ulcers on feet/legs.  Patient has Negative history of TIA or stroke symptom.  The patient denies amaurosis fugax or monocular blindness.  The patient  denies facial drooping.  Pt. denies hemiplegia.  The patient denies receptive or expressive aphasia.   Denies MI but had ischemic heart disease and had cardiac stent placed in 1995.   Pt Diabetic: No Pt smoker: former smoker, quit in 1995  Current Outpatient Prescriptions  Medication Sig Dispense Refill  . amLODipine (NORVASC) 5 MG tablet TAKE 1 TABLET (5 MG TOTAL) BY MOUTH DAILY.  30 tablet  3  . aspirin 81 MG tablet Take 81 mg by mouth daily.        . cholecalciferol (VITAMIN D) 1000 UNITS tablet Take 1,000 Units by mouth daily.        Marland Kitchen FOLIC ACID PO Take 1 tablet by mouth daily.        Marland Kitchen levothyroxine (SYNTHROID, LEVOTHROID) 137 MCG tablet Take 137 mcg by mouth daily.        . metoprolol (LOPRESSOR) 50 MG tablet TAKE 1&1/2 TABLETS BY MOUTH TWICE A DAY  90 tablet  3  . Multiple Vitamins-Minerals (EYE VITAMINS) CAPS Take by mouth.      . niacin (NIASPAN) 1000 MG CR tablet Take 1,000 mg by mouth at bedtime.        . nitroGLYCERIN (NITROSTAT) 0.4 MG SL tablet Place 1 tablet (0.4 mg total) under  the tongue every 5 (five) minutes as needed for chest pain.  25 tablet  1  . NONFORMULARY OR COMPOUNDED ITEM Estradiol .02% 1 ML Prefilled Applicator Sig: apply vaginally twice a week #90 Day Supply with 4 refills  1 each  4  . ramipril (ALTACE) 10 MG capsule Take 1 tablet by mouth Daily.      . rosuvastatin (CRESTOR) 20 MG tablet Take 20 mg by mouth daily.         No current facility-administered medications for this visit.    Pt meds include: Statin :Yes Betablocker: Yes ASA: Yes Other anticoagulants/antiplatelets: no  Past Medical History  Diagnosis Date  . Hypertension     well controlled  . Hyperlipidemia   . PAD (peripheral artery disease)      with stable claudication  . Hyperthyroidism   . Osteopenia   . Lupus anticoagulant disorder   . History of obesity   . Basal cell cancer     Nose  . B12 deficiency   . Cataract   . First degree atrioventricular block   . Colon polyps   . Coronary artery disease     status post angioplasty of the mid RCA in 1995  . Carotid artery occlusion     Past Surgical History  Procedure Laterality Date  . Endarterectomy      right carotid endarterectomy  . Cholecystectomy  1999  . Repair tendons foot    . Meniscus repair Left 10/29/2012  . Carotid endarterectomy Right 2002    Social History History  Substance Use Topics  . Smoking status: Former Smoker -- 1.00 packs/day for 40 years    Types: Cigarettes    Quit date: 11/18/1993  . Smokeless tobacco: Never Used  . Alcohol Use: No    Family History Family History  Problem Relation Age of Onset  . Heart attack Father 52  . Diabetes Sister   . Heart disease Brother     underwent coronary bypass grafting at age 18    No Known Allergies   REVIEW OF SYSTEMS  General: [ ]  Weight loss, [ ]  Fever, [ ]  chills Neurologic: [ ]  Dizziness, [ ]  Blackouts, [ ]  Seizure [ ]  Stroke, [ ]  "Mini stroke", [ ]  Slurred speech, [ ]  Temporary blindness; [ ]  weakness in arms or legs, [ ]  Hoarseness [ ]  Dysphagia Cardiac: [ ]  Chest pain/pressure, [ ]  Shortness of breath at rest [ ]  Shortness of breath with exertion, [ ]  Atrial fibrillation or irregular heartbeat  Vascular: [ ]  Pain in legs with walking, [ ]  Pain in legs at rest, [ ]  Pain in legs at night,  [ ]  Non-healing ulcer, [ ]  Blood clot in vein/DVT,   Pulmonary: [ ]  Home oxygen, [ ]  Productive cough, [ ]  Coughing up blood, [ ]  Asthma,  [ ]  Wheezing [ ]  COPD Musculoskeletal:  [ ]  Arthritis, [ ]  Low back pain, [ ]  Joint pain Hematologic: [ ]  Easy Bruising, [ ]  Anemia; [ ]  Hepatitis Gastrointestinal: [ ]  Blood in stool, [ ]  Gastroesophageal Reflux/heartburn, Urinary: [ ]  chronic Kidney  disease, [ ]  on HD - [ ]  MWF or [ ]  TTHS, [ ]  Burning with urination, [ ]  Difficulty urinating Skin: [ ]  Rashes, [ ]  Wounds Psychological: [ ]  Anxiety, [ ]  Depression  Physical Examination Filed Vitals:   09/15/13 1458  BP: 100/53  Pulse: 64  Resp:    Filed Weights   09/15/13 1453  Weight: 193 lb (87.544 kg)  Body mass index is 31.88 kg/(m^2).  General:  WDWN in NAD, obese. Gait: Limp HENT: WNL Eyes: Pupils equal Pulmonary: normal non-labored breathing , without Rales, rhonchi, or Wheezing. Cardiac: RRR, without  Murmurs, rubs or gallops;  Abdomen: soft, NT, no masses Skin: no rashes, ulcers noted;  no Gangrene , no cellulitis; no open wounds;   Vascular Exam/Pulses: VASCULAR EXAM  Carotid Bruits Left Right   Negative Positive     Aorta is not palpable. Radial pulses right is 2+, left is not palpable. Brachial pulses: 3+ bilaterally.                        VASCULAR EXAM: Extremities without ischemic changes  without Gangrene; without open wounds.                                                                                                          LE Pulses LEFT RIGHT       FEMORAL   palpable   palpable        POPLITEAL  not palpable   not palpable       POSTERIOR TIBIAL  not palpable   not palpable        DORSALIS PEDIS      ANTERIOR TIBIAL not palpable  faintly palpable      Musculoskeletal: no muscle wasting or atrophy; no edema  Neurologic: A&O X 3; Appropriate Affect ;  SENSATION: normal; MOTOR FUNCTION: 5/5 Symmetric, CN 2-12 intact Speech is fluent/normal  Non-Invasive Vascular Imaging (09/15/2013):   Carotid Duplex: Right ICA is patent (CEA site), mild hyperplasia is present at the proximal posterior patch. Left ICA stenosis is present at 40-59%. Bilateral external carotid artery stenosis is present. Bilateral vertebral arteries are patent and antegrade. Brachial systolic pressure gradient: right: 138, left: 96 with dampened brachial  waveform on the left suggestive of more proximal disease process. No significant change from 09/16/2012.  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 Her right ICA is patent with mild plaque formation at proximal posterior patch of the CEA. Her left ICA stenosis appears to be 40-59%. The ICA stenosis is unchanged in the last year. She has known left subclavian stenosis but has no steal symptoms in either hand/arm. She has no claudication symptoms in her right leg and left leg mild pain in anterior/lateral calf with walking may be related to an old twisting injury of her left ankle/foot, but this does not limit her walking or other activities. Her CVD risk factors are all addressed, she is working on losing some weight, she exercises at least 3 days/week.  PLAN:  Based on today's exam and Duplex results, patient advised to return in 1 year for Carotid Duplex and ABI's. I discussed in depth with the patient the nature of atherosclerosis, and emphasized the importance of maximal medical management including strict control of blood pressure, blood glucose, and lipid  levels, obtaining regular exercise, and continued cessation of smoking.  The patient is aware that without maximal medical management the underlying atherosclerotic disease process will progress, limiting the benefit of any interventions.  The patient was given information about stroke prevention and what symptoms should prompt the patient to seek immediate medical care.  The patient was given information about PAD and stroke prevention including signs, symptoms, treatment, what symptoms should prompt the patient to seek immediate medical care, and risk reduction measures to take.  Charisse March, RN, MSN, FNP-C [facility] Office: [phone]  Clinic MD:  Edilia Bo 09/15/2013 2:53 PM

## 2013-10-25 ENCOUNTER — Ambulatory Visit (INDEPENDENT_AMBULATORY_CARE_PROVIDER_SITE_OTHER): Payer: Medicare Other | Admitting: Cardiology

## 2013-10-25 ENCOUNTER — Encounter: Payer: Self-pay | Admitting: Cardiology

## 2013-10-25 VITALS — BP 149/56 | HR 73 | Ht 65.25 in | Wt 191.0 lb

## 2013-10-25 DIAGNOSIS — I10 Essential (primary) hypertension: Secondary | ICD-10-CM

## 2013-10-25 DIAGNOSIS — E785 Hyperlipidemia, unspecified: Secondary | ICD-10-CM

## 2013-10-25 DIAGNOSIS — I2510 Atherosclerotic heart disease of native coronary artery without angina pectoris: Secondary | ICD-10-CM | POA: Diagnosis not present

## 2013-10-25 DIAGNOSIS — I739 Peripheral vascular disease, unspecified: Secondary | ICD-10-CM | POA: Diagnosis not present

## 2013-10-25 NOTE — Progress Notes (Addendum)
[name] Date of Birth: [birth year]   History of Present Illness: [name] is seen today for followup. She has a history of coronary disease and had remote angioplasty of the mid right coronary in 1995. She continues to do very well from a cardiac standpoint. She denies any symptoms of chest pain, shortness of breath, or palpitations. She does complain of some left leg claudication. Recent carotid dopplers showed 40-59% stenosis on the left. The right ICA is clear. She denies any right leg claudication. She continues to exercise regularly.  Current Outpatient Prescriptions on File Prior to Visit  Medication Sig Dispense Refill  . amLODipine (NORVASC) 5 MG tablet TAKE 1 TABLET (5 MG TOTAL) BY MOUTH DAILY.  30 tablet  3  . aspirin 81 MG tablet Take 81 mg by mouth daily.        . cholecalciferol (VITAMIN D) 1000 UNITS tablet Take 1,000 Units by mouth daily.        Marland Kitchen FOLIC ACID PO Take 1 tablet by mouth daily.        Marland Kitchen levothyroxine (SYNTHROID, LEVOTHROID) 137 MCG tablet Take 137 mcg by mouth daily.        . metoprolol (LOPRESSOR) 50 MG tablet TAKE 1&1/2 TABLETS BY MOUTH TWICE A DAY  90 tablet  3  . Multiple Vitamins-Minerals (EYE VITAMINS) CAPS Take by mouth.      . niacin (NIASPAN) 1000 MG CR tablet Take 1,000 mg by mouth at bedtime.        . nitroGLYCERIN (NITROSTAT) 0.4 MG SL tablet Place 1 tablet (0.4 mg total) under the tongue every 5 (five) minutes as needed for chest pain.  25 tablet  1  . NONFORMULARY OR COMPOUNDED ITEM Estradiol .02% 1 ML Prefilled Applicator Sig: apply vaginally twice a week #90 Day Supply with 4 refills  1 each  4  . ramipril (ALTACE) 10 MG capsule Take 1 tablet by mouth Daily.      . rosuvastatin (CRESTOR) 20 MG tablet Take 20 mg by mouth daily.         No current facility-administered medications on file prior to visit.    No Known Allergies  Past Medical History  Diagnosis Date  . Hypertension     well controlled  . Hyperlipidemia   . PAD (peripheral  artery disease)     with stable claudication  . Hyperthyroidism   . Osteopenia   . Lupus anticoagulant disorder   . History of obesity   . Basal cell cancer     Nose  . B12 deficiency   . Cataract   . First degree atrioventricular block   . Colon polyps   . Coronary artery disease     status post angioplasty of the mid RCA in 1995  . Carotid artery occlusion     Past Surgical History  Procedure Laterality Date  . Endarterectomy      right carotid endarterectomy  . Cholecystectomy  1999  . Repair tendons foot    . Meniscus repair Left 10/29/2012  . Carotid endarterectomy Right 2002    History  Smoking status  . Former Smoker -- 1.00 packs/day for 40 years  . Types: Cigarettes  . Quit date: 11/18/1993  Smokeless tobacco  . Never Used    History  Alcohol Use No    Family History  Problem Relation Age of Onset  . Heart attack Father 67  . Diabetes Sister   . Heart disease Brother     underwent coronary bypass  grafting at age 87    Review of Systems: The review of systems is positive as noted above. All other systems were reviewed and are negative.  Physical Exam: BP 149/56  Pulse 73  Ht 5' 5.25" (1.657 m)  Wt 191 lb (86.637 kg)  BMI 31.55 kg/m2 She is an overweight white female in no acute distress. She is normocephalic, atraumatic. Pupils are equal round and reactive. Sclera clear. Oropharynx is clear. Neck is without JVD, adenopathy, thyromegaly. There is a right carotid bruit. She has an old CEA scar. Lungs are clear. Cardiac exam reveals a regular rate and rhythm without a gallop. There is a soft systolic ejection murmur the right upper sternal border. Abdomen is soft and nontender. She has no motion edema. Pedal pulses are diminished. Skin is warm and dry. She is alert oriented x3. Cranial nerves II through XII are intact. LABORATORY DATA: Labs done on 03/11/2013 show a normal chemistry panel. CBC is normal. Total cholesterol 143, triglycerides 143, HDL 53,  LDL 61.  Ecg today shows NSR with a normal Ecg.   Assessment / Plan: 1. Coronary disease with remote angioplasty of the right coronary in 1995. Myoview study in April 2013 showed no significant ischemia and ejection fraction of 74%. Continue medical management with aspirin, metoprolol, amlodipine, and ACE inhibitor.  2.  PAD with chronic claudication. ABIs are stable per [facility].   3. Carotid arterial disease status post right carotid endarterectomy. Moderate Left ICA disease.  4. Hypertension, controlled.  5. Hyperlipidemia well controlled on Crestor, niacin, and fish oil. Based on recent data would consider stopping niacin given lack of outcome benefit. She will discuss with Dr. Waynard Edwards.

## 2013-10-25 NOTE — Patient Instructions (Signed)
Continue your current therapy  Consider stopping Niacin--discuss with Dr. Waynard Edwards  I will see you in 6 months.

## 2013-10-27 ENCOUNTER — Ambulatory Visit (INDEPENDENT_AMBULATORY_CARE_PROVIDER_SITE_OTHER): Payer: Medicare Other | Admitting: Gynecology

## 2013-10-27 ENCOUNTER — Encounter: Payer: Self-pay | Admitting: Gynecology

## 2013-10-27 VITALS — BP 130/82 | Ht 64.5 in | Wt 190.0 lb

## 2013-10-27 DIAGNOSIS — L293 Anogenital pruritus, unspecified: Secondary | ICD-10-CM

## 2013-10-27 DIAGNOSIS — N952 Postmenopausal atrophic vaginitis: Secondary | ICD-10-CM | POA: Diagnosis not present

## 2013-10-27 DIAGNOSIS — D6859 Other primary thrombophilia: Secondary | ICD-10-CM | POA: Diagnosis not present

## 2013-10-27 DIAGNOSIS — D6862 Lupus anticoagulant syndrome: Secondary | ICD-10-CM

## 2013-10-27 DIAGNOSIS — M899 Disorder of bone, unspecified: Secondary | ICD-10-CM | POA: Diagnosis not present

## 2013-10-27 DIAGNOSIS — M8580 Other specified disorders of bone density and structure, unspecified site: Secondary | ICD-10-CM | POA: Insufficient documentation

## 2013-10-27 DIAGNOSIS — L292 Pruritus vulvae: Secondary | ICD-10-CM

## 2013-10-27 MED ORDER — CLOBETASOL PROPIONATE 0.05 % EX CREA: 1.0000 "application " | TOPICAL_CREAM | Freq: Two times a day (BID) | CUTANEOUS | Status: DC

## 2013-10-27 NOTE — Patient Instructions (Signed)
Tetanus, Diphtheria (Td) Vaccine What You Need to Know WHY GET VACCINATED? Tetanus  and diphtheria are very serious diseases. They are rare in the [place] today, but people who do become infected often have severe complications. Td vaccine is used to protect adolescents and adults from both of these diseases. Both tetanus and diphtheria are infections caused by bacteria. Diphtheria spreads from person to person through coughing or sneezing. Tetanus-causing bacteria enter the body through cuts, scratches, or wounds. TETANUS (Lockjaw) causes painful muscle tightening and stiffness, usually all over the body.  It can lead to tightening of muscles in the head and neck so you can't open your mouth, swallow, or sometimes even breathe. Tetanus kills about 1 out of every 5 people who are infected. DIPHTHERIA can cause a thick coating to form in the back of the throat.  It can lead to breathing problems, paralysis, heart failure, and death. Before vaccines, the [place] saw as many as 200,000 cases a year of diphtheria and hundreds of cases of tetanus. Since vaccination began, cases of both diseases have dropped by about 99%. TD VACCINE Td vaccine can protect adolescents and adults from tetanus and diphtheria. Td is usually given as a booster dose every 10 years but it can also be given earlier after a severe and dirty wound or burn. Your doctor can give you more information. Td may safely be given at the same time as other vaccines. SOME PEOPLE SHOULD NOT GET THIS VACCINE  If you ever had a life-threatening allergic reaction after a dose of any tetanus or diphtheria containing vaccine, OR if you have a severe allergy to any part of this vaccine, you should not get Td. Tell your doctor if you have any severe allergies.  Talk to your doctor if you:  have epilepsy or another nervous system problem,  had severe pain or swelling after any vaccine containing diphtheria or tetanus,  ever had  Guillain Barr Syndrome (GBS),  aren't feeling well on the day the shot is scheduled. RISKS OF A VACCINE REACTION With a vaccine, like any medicine, there is a chance of side effects. These are usually mild and go away on their own. Serious side effects are also possible, but are very rare. Most people who get Td vaccine do not have any problems with it. Mild Problems  following Td (Did not interfere with activities)  Pain where the shot was given (about 8 people in 10)  Redness or swelling where the shot was given (about 1 person in 3)  Mild fever (about 1 person in 15)  Headache or Tiredness (uncommon) Moderate Problems following Td (Interfered with activities, but did not require medical attention)  Fever over 102 F (38.9 C) (rare) Severe Problems  following Td (Unable to perform usual activities; required medical attention)  Swelling, severe pain, bleeding, or redness in the arm where the shot was given (rare). Problems that could happen after any vaccine:  Brief fainting spells can happen after any medical procedure, including vaccination. Sitting or lying down for about 15 minutes can help prevent fainting, and injuries caused by a fall. Tell your doctor if you feel dizzy, or have vision changes or ringing in the ears.  Severe shoulder pain and reduced range of motion in the arm where a shot was given can happen, very rarely, after a vaccination.  Severe allergic reactions from a vaccine are very rare, estimated at less than 1 in a million doses. If one were to occur, it would   usually be within a few minutes to a few hours after the vaccination. WHAT IF THERE IS A SERIOUS REACTION? What should I look for?  Look for anything that concerns you, such as signs of a severe allergic reaction, very high fever, or behavior changes. Signs of a severe allergic reaction can include hives, swelling of the face and throat, difficulty breathing, a fast heartbeat, dizziness, and  weakness. These would usually start a few minutes to a few hours after the vaccination. What should I do?  If you think it is a severe allergic reaction or other emergency that can't wait, call 911 or get the person to the nearest hospital. Otherwise, call your doctor.  Afterward, the reaction should be reported to the Vaccine Adverse Event Reporting System (VAERS). Your doctor might file this report, or, you can do it yourself through the VAERS website or by calling 1-800-822-7967. VAERS is only for reporting reactions. They do not give medical advice. THE NATIONAL VACCINE INJURY COMPENSATION PROGRAM The National Vaccine Injury Compensation Program (VICP) is a federal program that was created to compensate people who may have been injured by certain vaccines. Persons who believe they may have been injured by a vaccine can learn about the program and about filing a claim by calling 1-800-338-2382 or visiting the VICP website. HOW CAN I LEARN MORE?  Ask your doctor.  Contact your local or state health department.  Contact the Centers for Disease Control and Prevention (CDC):  Call 1-800-232-4636 (1-800-CDC-INFO)  Visit CDC's vaccines website CDC Td Vaccine Interim VIS (12/22/12) Document Released: 09/01/2006 Document Revised: 03/01/2013 Document Reviewed: 02/24/2013 [employer] Patient Information 2014 [employer].  

## 2013-10-27 NOTE — Progress Notes (Signed)
[name] 02/09/1929 [id number]   History:    79 y.o. for GYN followup exam. Patient was seen in the office on July 3 complaining of burning irritation of the external genitalia. She was found to have agglutination of the labia minora along with atrophic changes of the external genitalia and vagina. She was started on low-dose estrogen cream consisting of estradiol 0.02% which she applied twice weekly. She returns for full gynecological examination and is still complaining of pruritus especially near the upper portion of her introitus where she had the agglutination.  Patient was a prior patient of Dr. Nicholas Lose is retired. Review of the documentation that she brought with her indicated history her before for lichen sclerosus with clobetasol cream in the past which she is currently not using. Her PCP is Dr. Darcus Austin who has been treating her for hypercholesterolemia, hypertension, and hypertriglyceridemia and blood sugars. Her last colonoscopy was in 2009 which was normal by Dr. Ewing Schlein. Her last mammogram was February this year which was normal. Patient denied any prior history of abnormal Pap smears in the past. Her last bone density study was 2 years ago and she has a history of osteopenia and has one scheduled for next month. All patient's vaccinations are up-to-date. Past medical history,surgical history, family history and social history were all reviewed and documented in the EPIC chart.  Gynecologic History No LMP recorded. Patient is postmenopausal. Contraception: post menopausal status Last Pap: 2012. Results were: normal Last mammogram: 2014. Results were: normal  Obstetric History OB History  Gravida Para Term Preterm AB SAB TAB Ectopic Multiple Living  3 3        3     # Outcome Date GA Lbr Len/2nd Weight Sex Delivery Anes PTL Lv  3 PAR           2 PAR           1 PAR                ROS: A ROS was performed and pertinent positives and negatives are included in the history.  GENERAL: No fevers or chills. HEENT: No change in vision, no earache, sore throat or sinus congestion. NECK: No pain or stiffness. CARDIOVASCULAR: No chest pain or pressure. No palpitations. PULMONARY: No shortness of breath, cough or wheeze. GASTROINTESTINAL: No abdominal pain, nausea, vomiting or diarrhea, melena or bright red blood per rectum. GENITOURINARY: No urinary frequency, urgency, hesitancy or dysuria. MUSCULOSKELETAL: No joint or muscle pain, no back pain, no recent trauma. DERMATOLOGIC: vulvar pruritus. ENDOCRINE: No polyuria, polydipsia, no heat or cold intolerance. No recent change in weight. HEMATOLOGICAL: No anemia or easy bruising or bleeding. NEUROLOGIC: No headache, seizures, numbness, tingling or weakness. PSYCHIATRIC: No depression, no loss of interest in normal activity or change in sleep pattern.     Exam: chaperone present  BP 130/82  Ht 5' 4.5" (1.638 m)  Wt 190 lb (86.183 kg)  BMI 32.12 kg/m2  Body mass index is 32.12 kg/(m^2).  General appearance : Well developed well nourished female. No acute distress HEENT: Neck supple, trachea midline, no carotid bruits, no thyroidmegaly Lungs: Clear to auscultation, no rhonchi or wheezes, or rib retractions  Heart: Regular rate and rhythm, no murmurs or gallops Breast:Examined in sitting and supine position were symmetrical in appearance, no palpable masses or tenderness,  no skin retraction, no nipple inversion, no nipple discharge, no skin discoloration, no axillary or supraclavicular lymphadenopathy Abdomen: no palpable masses or tenderness, no rebound or guarding Extremities:  no edema or skin discoloration or tenderness  Pelvic:Physical Exam  Genitourinary:        Bartholin, Urethra, Skene Glands: Within normal limits             Vagina: No gross lesions or discharge  Cervix: No gross lesions or discharge  Uterus  axial, normal size, shape and consistency, non-tender and mobile  Adnexa  Without masses or  tenderness  Anus and perineum  normal   Rectovaginal  normal sphincter tone without palpated masses or tenderness             Hemoccult PCP provides     Assessment/Plan:  79 y.o. postmenopausal patient with past history of lichen sclerosus will be started back on clobetasol 0.05% to apply twice a day for the next 2 weeks. She will then restart her vaginal estradiol which she will continue to apply twice a week but not only internal vaginally but also external labia especially the area where the agglutination is present. Her PCP has been doing her blood work. She will have her bone density study next month. We discussed the new Pap smear guidelines that she will no longer needs Pap smears. If symptoms do not improve we will proceed with vulvar biopsy. We discussed importance of calcium and vitamin D and regular exercise for osteoporosis prevention. All of patient's vaccines are up-to-date.  Note: This dictation was prepared with  Dragon/digital dictation along withSmart phrase technology. Any transcriptional errors that result from this process are unintentional.   Ok Edwards MD, 10:56 AM 10/27/2013

## 2013-11-24 DIAGNOSIS — M949 Disorder of cartilage, unspecified: Secondary | ICD-10-CM | POA: Diagnosis not present

## 2013-11-24 DIAGNOSIS — M899 Disorder of bone, unspecified: Secondary | ICD-10-CM | POA: Diagnosis not present

## 2013-12-16 ENCOUNTER — Other Ambulatory Visit: Payer: Self-pay | Admitting: Cardiology

## 2013-12-27 DIAGNOSIS — I781 Nevus, non-neoplastic: Secondary | ICD-10-CM | POA: Diagnosis not present

## 2013-12-27 DIAGNOSIS — Z85828 Personal history of other malignant neoplasm of skin: Secondary | ICD-10-CM | POA: Diagnosis not present

## 2013-12-27 DIAGNOSIS — L570 Actinic keratosis: Secondary | ICD-10-CM | POA: Diagnosis not present

## 2014-01-26 DIAGNOSIS — H5231 Anisometropia: Secondary | ICD-10-CM | POA: Diagnosis not present

## 2014-01-26 DIAGNOSIS — H2640 Unspecified secondary cataract: Secondary | ICD-10-CM | POA: Diagnosis not present

## 2014-01-26 DIAGNOSIS — H04129 Dry eye syndrome of unspecified lacrimal gland: Secondary | ICD-10-CM | POA: Diagnosis not present

## 2014-01-26 DIAGNOSIS — H259 Unspecified age-related cataract: Secondary | ICD-10-CM | POA: Diagnosis not present

## 2014-02-07 ENCOUNTER — Other Ambulatory Visit (HOSPITAL_BASED_OUTPATIENT_CLINIC_OR_DEPARTMENT_OTHER): Payer: Self-pay | Admitting: Internal Medicine

## 2014-02-07 DIAGNOSIS — Z1231 Encounter for screening mammogram for malignant neoplasm of breast: Secondary | ICD-10-CM

## 2014-02-10 DIAGNOSIS — H26499 Other secondary cataract, unspecified eye: Secondary | ICD-10-CM | POA: Diagnosis not present

## 2014-02-10 DIAGNOSIS — H2640 Unspecified secondary cataract: Secondary | ICD-10-CM | POA: Diagnosis not present

## 2014-03-08 DIAGNOSIS — H25049 Posterior subcapsular polar age-related cataract, unspecified eye: Secondary | ICD-10-CM | POA: Diagnosis not present

## 2014-03-08 DIAGNOSIS — H25019 Cortical age-related cataract, unspecified eye: Secondary | ICD-10-CM | POA: Diagnosis not present

## 2014-03-08 DIAGNOSIS — H269 Unspecified cataract: Secondary | ICD-10-CM | POA: Diagnosis not present

## 2014-03-08 DIAGNOSIS — H2510 Age-related nuclear cataract, unspecified eye: Secondary | ICD-10-CM | POA: Diagnosis not present

## 2014-03-16 ENCOUNTER — Ambulatory Visit (HOSPITAL_COMMUNITY)
Admission: RE | Admit: 2014-03-16 | Discharge: 2014-03-16 | Disposition: A | Discharge status: Home / Self Care | Payer: Medicare Other | Source: Ambulatory Visit | Attending: Internal Medicine | Admitting: Internal Medicine

## 2014-03-16 DIAGNOSIS — Z1231 Encounter for screening mammogram for malignant neoplasm of breast: Secondary | ICD-10-CM | POA: Diagnosis not present

## 2014-04-05 ENCOUNTER — Encounter: Payer: Self-pay | Admitting: Cardiology

## 2014-04-05 DIAGNOSIS — R7301 Impaired fasting glucose: Secondary | ICD-10-CM | POA: Diagnosis not present

## 2014-04-05 DIAGNOSIS — E538 Deficiency of other specified B group vitamins: Secondary | ICD-10-CM | POA: Diagnosis not present

## 2014-04-05 DIAGNOSIS — E785 Hyperlipidemia, unspecified: Secondary | ICD-10-CM | POA: Diagnosis not present

## 2014-04-05 DIAGNOSIS — I2510 Atherosclerotic heart disease of native coronary artery without angina pectoris: Secondary | ICD-10-CM | POA: Diagnosis not present

## 2014-04-05 DIAGNOSIS — R809 Proteinuria, unspecified: Secondary | ICD-10-CM | POA: Diagnosis not present

## 2014-04-05 DIAGNOSIS — R82998 Other abnormal findings in urine: Secondary | ICD-10-CM | POA: Diagnosis not present

## 2014-04-05 DIAGNOSIS — I10 Essential (primary) hypertension: Secondary | ICD-10-CM | POA: Diagnosis not present

## 2014-04-05 DIAGNOSIS — E039 Hypothyroidism, unspecified: Secondary | ICD-10-CM | POA: Diagnosis not present

## 2014-04-08 ENCOUNTER — Other Ambulatory Visit: Payer: Self-pay | Admitting: Cardiology

## 2014-04-11 ENCOUNTER — Other Ambulatory Visit: Payer: Self-pay | Admitting: Cardiology

## 2014-04-12 DIAGNOSIS — N182 Chronic kidney disease, stage 2 (mild): Secondary | ICD-10-CM | POA: Diagnosis not present

## 2014-04-12 DIAGNOSIS — I10 Essential (primary) hypertension: Secondary | ICD-10-CM | POA: Diagnosis not present

## 2014-04-12 DIAGNOSIS — R809 Proteinuria, unspecified: Secondary | ICD-10-CM | POA: Diagnosis not present

## 2014-04-12 DIAGNOSIS — E785 Hyperlipidemia, unspecified: Secondary | ICD-10-CM | POA: Diagnosis not present

## 2014-04-12 DIAGNOSIS — R82998 Other abnormal findings in urine: Secondary | ICD-10-CM | POA: Diagnosis not present

## 2014-04-12 DIAGNOSIS — E039 Hypothyroidism, unspecified: Secondary | ICD-10-CM | POA: Diagnosis not present

## 2014-04-12 DIAGNOSIS — Z1331 Encounter for screening for depression: Secondary | ICD-10-CM | POA: Diagnosis not present

## 2014-04-12 DIAGNOSIS — D649 Anemia, unspecified: Secondary | ICD-10-CM | POA: Diagnosis not present

## 2014-04-12 DIAGNOSIS — Z0000 Encounter for general adult medical examination without abnormal findings: Secondary | ICD-10-CM | POA: Diagnosis not present

## 2014-04-12 DIAGNOSIS — E538 Deficiency of other specified B group vitamins: Secondary | ICD-10-CM | POA: Diagnosis not present

## 2014-04-25 ENCOUNTER — Ambulatory Visit (INDEPENDENT_AMBULATORY_CARE_PROVIDER_SITE_OTHER): Payer: Medicare Other | Admitting: Cardiology

## 2014-04-25 ENCOUNTER — Encounter: Payer: Self-pay | Admitting: Cardiology

## 2014-04-25 VITALS — BP 140/68 | HR 68 | Ht 64.5 in | Wt 182.8 lb

## 2014-04-25 DIAGNOSIS — I739 Peripheral vascular disease, unspecified: Secondary | ICD-10-CM

## 2014-04-25 DIAGNOSIS — E785 Hyperlipidemia, unspecified: Secondary | ICD-10-CM

## 2014-04-25 DIAGNOSIS — I10 Essential (primary) hypertension: Secondary | ICD-10-CM

## 2014-04-25 DIAGNOSIS — I2510 Atherosclerotic heart disease of native coronary artery without angina pectoris: Secondary | ICD-10-CM | POA: Diagnosis not present

## 2014-04-25 NOTE — Progress Notes (Signed)
[name] Date of Birth: [birth year]   History of Present Illness: [name] is seen today for followup. She has a history of coronary disease and had remote angioplasty of the mid right coronary in 1995. Her last myoview in 4/13 showed mild anterior attenuation and was otherwise normal.  She continues to do very well from a cardiac standpoint. She denies any symptoms of chest pain, shortness of breath, or palpitations. She has some left leg claudication. Last  carotid dopplers showed 40-59% stenosis on the left. The right ICA is clear. She denies any right leg claudication. She continues to exercise regularly with water aerobics and walking. She has lost 9 lbs.  Current Outpatient Prescriptions on File Prior to Visit  Medication Sig Dispense Refill  . amLODipine (NORVASC) 5 MG tablet TAKE 1 TABLET BY MOUTH EVERY DAY  30 tablet  0  . aspirin 81 MG tablet Take 81 mg by mouth daily.        . cholecalciferol (VITAMIN D) 1000 UNITS tablet Take 1,000 Units by mouth daily.        . clobetasol cream (TEMOVATE) 2.13 % Apply 1 application topically 2 (two) times daily.  30 g  2  . FOLIC ACID PO Take 1 tablet by mouth daily.        Marland Kitchen levothyroxine (SYNTHROID, LEVOTHROID) 137 MCG tablet Take 137 mcg by mouth daily.        . metoprolol (LOPRESSOR) 50 MG tablet TAKE 1&1/2 TABLETS BY MOUTH TWICE A DAY  90 tablet  0  . Multiple Vitamins-Minerals (EYE VITAMINS) CAPS Take by mouth.      . niacin (NIASPAN) 1000 MG CR tablet Take 1,000 mg by mouth at bedtime.        . nitroGLYCERIN (NITROSTAT) 0.4 MG SL tablet Place 1 tablet (0.4 mg total) under the tongue every 5 (five) minutes as needed for chest pain.  25 tablet  1  . NONFORMULARY OR COMPOUNDED ITEM Estradiol .02% 1 ML Prefilled Applicator Sig: apply vaginally twice a week #90 Day Supply with 4 refills  1 each  4  . ramipril (ALTACE) 10 MG capsule Take 1 tablet by mouth Daily.      . rosuvastatin (CRESTOR) 20 MG tablet Take 20 mg by mouth daily.          No current facility-administered medications on file prior to visit.    No Known Allergies  Past Medical History  Diagnosis Date  . Hypertension     well controlled  . Hyperlipidemia   . PAD (peripheral artery disease)     with stable claudication  . Hyperthyroidism   . Osteopenia   . Lupus anticoagulant disorder   . History of obesity   . Basal cell cancer     Nose  . B12 deficiency   . Cataract   . First degree atrioventricular block   . Colon polyps   . Coronary artery disease     status post angioplasty of the mid RCA in 1995  . Carotid artery occlusion     Past Surgical History  Procedure Laterality Date  . Endarterectomy      right carotid endarterectomy  . Cholecystectomy  1999  . Repair tendons foot    . Meniscus repair Left 10/29/2012  . Carotid endarterectomy Right 2002  . Knee arthroscopy Left     History  Smoking status  . Former Smoker -- 1.00 packs/day for 40 years  . Types: Cigarettes  . Quit date: 11/18/1993  Smokeless  tobacco  . Never Used    History  Alcohol Use No    Family History  Problem Relation Age of Onset  . Heart attack Father 50  . Hypertension Father   . Diabetes Sister   . Heart disease Brother     underwent coronary bypass grafting at age 28  . Hypertension Brother     Review of Systems: The review of systems is positive as noted above. All other systems were reviewed and are negative.  Physical Exam: BP 140/68  Pulse 68  Ht 5' 4.5" (1.638 m)  Wt 182 lb 12.8 oz (82.918 kg)  BMI 30.90 kg/m2 She is an overweight white female in no acute distress. She is normocephalic, atraumatic. Pupils are equal round and reactive. Sclera clear. Oropharynx is clear. Neck is without JVD, adenopathy, thyromegaly. There is a right carotid bruit. She has an old CEA scar. Lungs are clear. Cardiac exam reveals a regular rate and rhythm without a gallop. There is a soft systolic ejection murmur the right upper sternal border. Abdomen is  soft and nontender. She has no motion edema. Pedal pulses are diminished. Skin is warm and dry. She is alert oriented x3. Cranial nerves II through XII are intact. LABORATORY DATA: Recent lab work from this month at Dr. Silvestre Mesi was reviewed and scanned into Epic. Lipids were excellent. A1c 5.3%.   Assessment / Plan: 1. Coronary disease with remote angioplasty of the right coronary in 1995. Myoview study in April 2013 showed no significant ischemia and ejection fraction of 74%. Continue medical management with aspirin, metoprolol, amlodipine, and ACE inhibitor.  2.  PAD with chronic claudication. ABIs are stable per [facility]. Repeat doppler scheduled for the fall.  3. Carotid arterial disease status post right carotid endarterectomy. Moderate Left ICA disease. Repeat dopplers this fall.  4. Hypertension, controlled.  5. Hyperlipidemia well controlled on Crestor, niacin, and fish oil.

## 2014-04-25 NOTE — Patient Instructions (Signed)
Continue your current therapy  I will see you in 6 months.   

## 2014-04-29 DIAGNOSIS — H43819 Vitreous degeneration, unspecified eye: Secondary | ICD-10-CM | POA: Diagnosis not present

## 2014-05-02 DIAGNOSIS — H5310 Unspecified subjective visual disturbances: Secondary | ICD-10-CM | POA: Diagnosis not present

## 2014-05-02 DIAGNOSIS — H43819 Vitreous degeneration, unspecified eye: Secondary | ICD-10-CM | POA: Diagnosis not present

## 2014-05-02 DIAGNOSIS — H16109 Unspecified superficial keratitis, unspecified eye: Secondary | ICD-10-CM | POA: Diagnosis not present

## 2014-05-02 DIAGNOSIS — H04129 Dry eye syndrome of unspecified lacrimal gland: Secondary | ICD-10-CM | POA: Diagnosis not present

## 2014-05-12 ENCOUNTER — Other Ambulatory Visit: Payer: Self-pay | Admitting: Cardiology

## 2014-05-18 DIAGNOSIS — I10 Essential (primary) hypertension: Secondary | ICD-10-CM | POA: Diagnosis not present

## 2014-05-18 DIAGNOSIS — Z6831 Body mass index (BMI) 31.0-31.9, adult: Secondary | ICD-10-CM | POA: Diagnosis not present

## 2014-05-18 DIAGNOSIS — M810 Age-related osteoporosis without current pathological fracture: Secondary | ICD-10-CM | POA: Diagnosis not present

## 2014-06-21 DIAGNOSIS — E039 Hypothyroidism, unspecified: Secondary | ICD-10-CM | POA: Diagnosis not present

## 2014-06-27 DIAGNOSIS — L570 Actinic keratosis: Secondary | ICD-10-CM | POA: Diagnosis not present

## 2014-06-27 DIAGNOSIS — Z85828 Personal history of other malignant neoplasm of skin: Secondary | ICD-10-CM | POA: Diagnosis not present

## 2014-07-18 ENCOUNTER — Other Ambulatory Visit: Payer: Self-pay | Admitting: Gynecology

## 2014-07-28 ENCOUNTER — Other Ambulatory Visit: Payer: Self-pay | Admitting: Gynecology

## 2014-08-22 DIAGNOSIS — Z23 Encounter for immunization: Secondary | ICD-10-CM | POA: Diagnosis not present

## 2014-09-19 ENCOUNTER — Encounter: Payer: Self-pay | Admitting: Cardiology

## 2014-09-20 ENCOUNTER — Encounter: Payer: Self-pay | Admitting: Family

## 2014-09-21 ENCOUNTER — Ambulatory Visit (INDEPENDENT_AMBULATORY_CARE_PROVIDER_SITE_OTHER): Payer: Medicare Other | Admitting: Family

## 2014-09-21 ENCOUNTER — Encounter: Payer: Self-pay | Admitting: Family

## 2014-09-21 ENCOUNTER — Ambulatory Visit (HOSPITAL_COMMUNITY)
Admission: RE | Admit: 2014-09-21 | Discharge: 2014-09-21 | Disposition: A | Discharge status: Home / Self Care | Payer: Medicare Other | Source: Ambulatory Visit | Attending: Family | Admitting: Family

## 2014-09-21 ENCOUNTER — Ambulatory Visit (INDEPENDENT_AMBULATORY_CARE_PROVIDER_SITE_OTHER)
Admission: RE | Admit: 2014-09-21 | Discharge: 2014-09-21 | Disposition: A | Discharge status: Home / Self Care | Payer: Medicare Other | Source: Ambulatory Visit | Attending: Family | Admitting: Family

## 2014-09-21 ENCOUNTER — Telehealth: Payer: Self-pay | Admitting: Cardiology

## 2014-09-21 VITALS — BP 98/70 | HR 84 | Temp 97.9°F | Resp 16 | Ht 64.0 in | Wt 183.5 lb

## 2014-09-21 DIAGNOSIS — I6529 Occlusion and stenosis of unspecified carotid artery: Secondary | ICD-10-CM | POA: Insufficient documentation

## 2014-09-21 DIAGNOSIS — I70219 Atherosclerosis of native arteries of extremities with intermittent claudication, unspecified extremity: Secondary | ICD-10-CM | POA: Diagnosis not present

## 2014-09-21 DIAGNOSIS — I739 Peripheral vascular disease, unspecified: Secondary | ICD-10-CM

## 2014-09-21 DIAGNOSIS — I6523 Occlusion and stenosis of bilateral carotid arteries: Secondary | ICD-10-CM

## 2014-09-21 DIAGNOSIS — I2510 Atherosclerotic heart disease of native coronary artery without angina pectoris: Secondary | ICD-10-CM | POA: Diagnosis not present

## 2014-09-21 DIAGNOSIS — Z48812 Encounter for surgical aftercare following surgery on the circulatory system: Secondary | ICD-10-CM | POA: Diagnosis not present

## 2014-09-21 NOTE — Telephone Encounter (Signed)
New message      Talk to a nurse.  Pt has been having PVC's all day

## 2014-09-21 NOTE — Patient Instructions (Addendum)

## 2014-09-21 NOTE — Telephone Encounter (Signed)
Returning your call. °

## 2014-09-21 NOTE — Progress Notes (Signed)
[facility] HISTORY AND PHYSICAL   MRN : [id number]  History of Present Illness:   [name] is a 80 y.o. female patient that Dr. Scot Dock has been following status post right CEA on 03/09/2001 and for PAD. She returns today for follow up.   She did have evidence of chronic venous insufficiency and she was encouraged to continue her water aerobics and also to elevate her legs intermittently. She denies claudication symptoms with walking, denies non healing wounds.  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 Had torn meniscus in left knee, was repaired  Dec. 2013, and left leg feels better.  Patient has Negative history of TIA or stroke symptom. The patient denies amaurosis fugax or monocular blindness. The patient denies facial drooping. Pt. denies hemiplegia. The patient denies receptive or expressive aphasia.   Denies MI but had ischemic heart disease and had cardiac stent placed in 1995, quit smoking in 1995, she does no have DM. She states that she has known PVC's, can go weeks without noticing any, then have lots of PVC's at other times, states her PCP and cardiologist are aware of this.  Pt meds include: Statin :Yes Betablocker: Yes ASA: Yes Other anticoagulants/antiplatelets: no   Current Outpatient Prescriptions  Medication Sig Dispense Refill  . amLODipine (NORVASC) 5 MG tablet TAKE 1 TABLET BY MOUTH EVERY DAY 30 tablet 5  . aspirin 81 MG tablet Take 81 mg by mouth daily.      . cholecalciferol (VITAMIN D) 1000 UNITS tablet Take 1,000 Units by mouth daily.      . clobetasol cream (TEMOVATE) 6.01 % APPLY 1 APPLICATION TOPICALLY 2 (TWO) TIMES DAILY. 30 g 1  . FOLIC ACID PO Take 1 tablet by mouth daily.      Marland Kitchen levothyroxine (SYNTHROID, LEVOTHROID) 137 MCG tablet Take 137 mcg by mouth daily.      . metoprolol (LOPRESSOR) 50 MG tablet TAKE 1&1/2 TABLETS BY  MOUTH TWICE A DAY 90 tablet 5  . Multiple Vitamins-Minerals (EYE VITAMINS) CAPS Take by mouth.    . niacin (NIASPAN) 1000 MG CR tablet Take 1,000 mg by mouth at bedtime.      . nitroGLYCERIN (NITROSTAT) 0.4 MG SL tablet Place 1 tablet (0.4 mg total) under the tongue every 5 (five) minutes as needed for chest pain. 25 tablet 1  . NONFORMULARY OR COMPOUNDED ITEM Insert 76ml into vagina twice a week. 24 each 4  . ramipril (ALTACE) 10 MG capsule Take 1 tablet by mouth Daily.    . rosuvastatin (CRESTOR) 20 MG tablet Take 20 mg by mouth daily.       No current facility-administered medications for this visit.    Past Medical History  Diagnosis Date  . Hypertension     well controlled  . Hyperlipidemia   . PAD (peripheral artery disease)     with stable claudication  . Hyperthyroidism   . Osteopenia   . Lupus anticoagulant disorder   . History of obesity   . Basal cell cancer     Nose  . B12 deficiency   . Cataract   . First degree atrioventricular block   . Colon polyps   . Coronary artery disease     status post angioplasty of the mid RCA in 1995  . Carotid artery occlusion     Social History History  Substance Use Topics  . Smoking status: Former  Smoker -- 1.00 packs/day for 40 years    Types: Cigarettes    Quit date: 11/18/1993  . Smokeless tobacco: Never Used  . Alcohol Use: No    Family History Family History  Problem Relation Age of Onset  . Heart attack Father 34  . Hypertension Father   . Diabetes Sister   . Heart disease Brother     underwent coronary bypass grafting at age 50  . Hypertension Brother     Surgical History Past Surgical History  Procedure Laterality Date  . Endarterectomy      right carotid endarterectomy  . Cholecystectomy  1999  . Repair tendons foot    . Meniscus repair Left 10/29/2012  . Carotid endarterectomy Right 2002  . Knee arthroscopy Left     No Known Allergies  Current Outpatient Prescriptions  Medication Sig Dispense  Refill  . amLODipine (NORVASC) 5 MG tablet TAKE 1 TABLET BY MOUTH EVERY DAY 30 tablet 5  . aspirin 81 MG tablet Take 81 mg by mouth daily.      . cholecalciferol (VITAMIN D) 1000 UNITS tablet Take 1,000 Units by mouth daily.      . clobetasol cream (TEMOVATE) 7.49 % APPLY 1 APPLICATION TOPICALLY 2 (TWO) TIMES DAILY. 30 g 1  . FOLIC ACID PO Take 1 tablet by mouth daily.      Marland Kitchen levothyroxine (SYNTHROID, LEVOTHROID) 137 MCG tablet Take 137 mcg by mouth daily.      . metoprolol (LOPRESSOR) 50 MG tablet TAKE 1&1/2 TABLETS BY MOUTH TWICE A DAY 90 tablet 5  . Multiple Vitamins-Minerals (EYE VITAMINS) CAPS Take by mouth.    . niacin (NIASPAN) 1000 MG CR tablet Take 1,000 mg by mouth at bedtime.      . nitroGLYCERIN (NITROSTAT) 0.4 MG SL tablet Place 1 tablet (0.4 mg total) under the tongue every 5 (five) minutes as needed for chest pain. 25 tablet 1  . NONFORMULARY OR COMPOUNDED ITEM Insert 50ml into vagina twice a week. 24 each 4  . ramipril (ALTACE) 10 MG capsule Take 1 tablet by mouth Daily.    . rosuvastatin (CRESTOR) 20 MG tablet Take 20 mg by mouth daily.       No current facility-administered medications for this visit.     REVIEW OF SYSTEMS: See HPI for pertinent positives and negatives.  Physical Examination Filed Vitals:   09/21/14 1422 09/21/14 1425  BP: 120/62 98/70  Pulse: 76 84  Temp: 97.9 F (36.6 C)   TempSrc: Oral   Resp: 16   Height: 5\' 4"  (1.626 m)   Weight: 183 lb 8 oz (83.235 kg)   SpO2: 96%    Body mass index is 31.48 kg/(m^2).  General: WDWN in NAD, obese female. Gait: Limp HENT: WNL Eyes: PERRLA Pulmonary: normal non-labored breathing , without Rales, rhonchi, or Wheezing, occasional moist cough. Cardiac: RRR with frequent premature beats, no detected murmur  Abdomen: soft, NT, no palpated masses Skin: no rashes, ulcers noted; no Gangrene , no cellulitis; no open wounds.   Vascular Exam/Pulses: VASCULAR EXAM  Carotid Bruits Left Right    Negative Positive   Aorta is not palpable. Radial pulses right is 2+, left is not palpable. Brachial pulses: 3+ bilaterally.   VASCULAR EXAM: Extremities without ischemic changes  without Gangrene; without open wounds.     LE Pulses LEFT RIGHT   FEMORAL  palpable  palpable    POPLITEAL not palpable  not palpable   POSTERIOR TIBIAL not palpable  not palpable  DORSALIS PEDIS  ANTERIOR TIBIAL not palpable  not palpable      Musculoskeletal: no muscle wasting or atrophy; no peripheral edema Neurologic: A&O X 3; Appropriate Affect ;  SENSATION: normal; MOTOR FUNCTION: 5/5 Symmetric, CN 2-12 intact Speech is fluent/normal   Non-Invasive Vascular Imaging (09/21/2014):   CEREBROVASCULAR DUPLEX EVALUATION    INDICATION: Carotid endarterectomy     PREVIOUS INTERVENTION(S): Right carotid endarterectomy on 03/09/01    DUPLEX EXAM:     RIGHT  LEFT  Peak Systolic Velocities (cm/s) End Diastolic Velocities (cm/s) Plaque LOCATION Peak Systolic Velocities (cm/s) End Diastolic Velocities (cm/s) Plaque  122 20  CCA PROXIMAL 99 13   103 20 HT CCA MID 104 14 HT  84 19  CCA DISTAL 87 14   143 0 HT ECA 220 0 HT  146 28  ICA PROXIMAL 145 36 CP  117 33  ICA MID 101 23   115 31  ICA DISTAL 94 30     Not Calculated ICA / CCA Ratio (PSV) 1.7  Antegrade Vertebral Flow Not Visualized   951 Brachial Systolic Pressure (mmHg) 83  Multiphasic (subclavian artery) Brachial Artery Waveforms Multiphasic (subclavian artery)    Plaque Morphology:  HM = Homogeneous, HT = Heterogeneous, CP = Calcific Plaque, SP = Smooth Plaque, IP = Irregular Plaque     ADDITIONAL FINDINGS: . No significant stenosis of the right external or bilateral common carotid arteries. . Left external carotid  artery stenosis noted. . Velocity of 280cm/s noted in the left proximal subclavian artery. . Greater than 52mm/Mg difference in the bilateral brachial pressures noted.    IMPRESSION: 1. Patent right carotid endarterectomy site with no right internal carotid artery stenosis. 2. Doppler velocities suggest a less than 40% stenosis of the left proximal internal carotid artery. 3. Difference in brachial pressures noted, as described above.    Compared to the previous exam:  Maximum velocity of the left internal carotid artery appears less than previously recorded on 09/15/13 with no significant change in the right internal carotid artery.   ABI (Date: 09/21/2014)  R: 0.69 (02/10/13, 0.55), waveforms: monophasic, TBI: 0.56  L: 0.65 (0.54),waveforms: monophasic, TBI: 0.46   ASSESSMENT:  [name] is a 80 y.o. female who is s/p status post right CEA on 03/09/2001 and is also followed for PAD. She has no history of stroke or TIA, does have known left subclavian stenosis but no steal symptoms. Carotid Duplex today reveals a patent right carotid endarterectomy site with no right internal carotid artery stenosis and minimal left ICA stenosis. . Velocity of 280cm/s noted in the left proximal subclavian artery. . Greater than 40mm/Mg difference in the bilateral brachial pressures noted. Maximum velocity of the left internal carotid artery appears less than previously recorded on 09/15/13 with no significant change in the right internal carotid artery.  Slight improvement in bilateral ABI's to evidence of moderate arterial occlusive disease. She denies claudication symptoms with walking, denies non healing wounds.   PLAN:   Graduated walking program, maximal medical management. Based on today's exam and non-invasive vascular lab results, the patient will follow up in 1 year with the following tests: carotid Duplex and ABI's. I discussed in depth with the patient the nature of atherosclerosis,  and emphasized the importance of maximal medical management including strict control of blood pressure, blood glucose, and lipid levels, obtaining regular exercise, and cessation of smoking.  The patient is aware that without maximal medical management the underlying atherosclerotic disease process will progress, limiting  the benefit of any interventions.  The patient was given information about stroke prevention and what symptoms should prompt the patient to seek immediate medical care.  The patient was given information about PAD including signs, symptoms, treatment, what symptoms should prompt the patient to seek immediate medical care, and risk reduction measures to take. Thank you for allowing [place] to participate in this patient's care.  Clemon Chambers, RN, MSN, FNP-C [facility] Office: [phone]  Clinic MD: Scot Dock 09/21/2014 2:37 PM

## 2014-09-21 NOTE — Telephone Encounter (Signed)
Returned call to patient she stated she has had frequent PVC's all day.Stated B/P id 120/60 at present.Advised she can take a extra 25 mg of metoprolol now.When reviewing her medications she is taking mucinex d.Advised decongestants can cause PVC's.Advised to avoid decongestants and caffeine.Dr.[name] out of office this week will let him know you called.Advised to keep appointment with Dr.[name] 11/01/14 at 4:00 pm.Advised to call back if continues to have frequent PVC's.

## 2014-09-22 NOTE — Addendum Note (Signed)
Addended by: Mena Goes on: 09/22/2014 01:16 PM   Modules accepted: Orders

## 2014-10-17 ENCOUNTER — Telehealth: Payer: Self-pay | Admitting: Cardiology

## 2014-10-17 DIAGNOSIS — I2510 Atherosclerotic heart disease of native coronary artery without angina pectoris: Secondary | ICD-10-CM | POA: Diagnosis not present

## 2014-10-17 DIAGNOSIS — Z6831 Body mass index (BMI) 31.0-31.9, adult: Secondary | ICD-10-CM | POA: Diagnosis not present

## 2014-10-17 DIAGNOSIS — E785 Hyperlipidemia, unspecified: Secondary | ICD-10-CM | POA: Diagnosis not present

## 2014-10-17 DIAGNOSIS — E039 Hypothyroidism, unspecified: Secondary | ICD-10-CM | POA: Diagnosis not present

## 2014-10-17 DIAGNOSIS — R0602 Shortness of breath: Secondary | ICD-10-CM | POA: Diagnosis not present

## 2014-10-17 DIAGNOSIS — I10 Essential (primary) hypertension: Secondary | ICD-10-CM | POA: Diagnosis not present

## 2014-10-17 DIAGNOSIS — N182 Chronic kidney disease, stage 2 (mild): Secondary | ICD-10-CM | POA: Diagnosis not present

## 2014-10-17 NOTE — Telephone Encounter (Signed)
Received Records from [facility] ( Dr Joylene Draft) for appointment with Dr Martinique on 11/01/14.  Records given to [facility] (medical records) for Dr Morrison Old' schedule on 11/01/14.  lp

## 2014-10-18 ENCOUNTER — Telehealth: Payer: Self-pay | Admitting: Cardiology

## 2014-10-18 ENCOUNTER — Encounter: Payer: Self-pay | Admitting: Cardiology

## 2014-10-18 ENCOUNTER — Ambulatory Visit (INDEPENDENT_AMBULATORY_CARE_PROVIDER_SITE_OTHER): Payer: Medicare Other | Admitting: Cardiology

## 2014-10-18 VITALS — BP 146/80 | HR 77 | Ht 65.0 in | Wt 183.7 lb

## 2014-10-18 DIAGNOSIS — I5020 Unspecified systolic (congestive) heart failure: Secondary | ICD-10-CM | POA: Diagnosis not present

## 2014-10-18 DIAGNOSIS — I10 Essential (primary) hypertension: Secondary | ICD-10-CM | POA: Diagnosis not present

## 2014-10-18 DIAGNOSIS — I509 Heart failure, unspecified: Secondary | ICD-10-CM | POA: Insufficient documentation

## 2014-10-18 DIAGNOSIS — E785 Hyperlipidemia, unspecified: Secondary | ICD-10-CM | POA: Diagnosis not present

## 2014-10-18 DIAGNOSIS — I25110 Atherosclerotic heart disease of native coronary artery with unstable angina pectoris: Secondary | ICD-10-CM | POA: Diagnosis not present

## 2014-10-18 DIAGNOSIS — R0609 Other forms of dyspnea: Secondary | ICD-10-CM

## 2014-10-18 DIAGNOSIS — I208 Other forms of angina pectoris: Secondary | ICD-10-CM | POA: Diagnosis not present

## 2014-10-18 MED ORDER — ISOSORBIDE MONONITRATE ER 30 MG PO TB24: 15.0000 mg | ORAL_TABLET | Freq: Every day | ORAL | Status: DC

## 2014-10-18 NOTE — Telephone Encounter (Signed)
She said she saw her primary doctor and he said she had congested heart failure. He wanted her to contact you today and get in to be seen asap.

## 2014-10-18 NOTE — Telephone Encounter (Signed)
Returned call to patient she sated she saw PCP yesterday 10/17/14.Stated she has CHF and was told she needed to schedule appointment with Dr.[name] asap.Stated she had a cxr,lab work.Stated she received a message on mychart last night potassium was 8 and she is going back to office this morning to have repeated.Appointment scheduled with Cecilie Kicks NP this afternoon at 1:45 pm.Will call Dr.Perini's office and have lab,cxr faxed to [place].

## 2014-10-18 NOTE — Progress Notes (Signed)
10/18/2014   PCP: Jerlyn Ly, MD   Chief Complaint  Patient presents with  . Congestive Heart Failure    Per PCP. Patient reports shortness of breath with minimal exertion.    Primary Cardiologist:Dr. P. Martinique   HPI:  80 year old female seen today at request of PCP for CHF.  She has a history of coronary disease and had remote angioplasty of the mid right coronary in 1995. Her last myoview in 4/13 showed mild anterior attenuation and was otherwise normal. She had done very well from a cardiac standpoint.  Recently she has had some swelling in her feet and then began having SOB with exertion.  No chest pain but she never had chest pain with previous PTCA.  She has no SOB at rest only with exertion and now with just talking she is slightly winded.  Last carotid dopplers showed 40-59% stenosis on the left. The right ICA is clear. She denies any right leg claudication. She continues to exercise regularly with water aerobics.   She was seen by PCP her BMP was elevated at 314,  XR with CHF, placed on lasix 20 mg, has rec'd 1 dose, with loss of 5 lbs.  Concern that DOE is anginal equivalent and HF is ischemia in origin.  Her K+ was 6.1 but on recheck was 4.3.     No Known Allergies  Current Outpatient Prescriptions  Medication Sig Dispense Refill  . amLODipine (NORVASC) 5 MG tablet TAKE 1 TABLET BY MOUTH EVERY DAY 30 tablet 5  . aspirin 81 MG tablet Take 81 mg by mouth daily.      . cholecalciferol (VITAMIN D) 1000 UNITS tablet Take 1,000 Units by mouth daily.      . clobetasol cream (TEMOVATE) 8.93 % APPLY 1 APPLICATION TOPICALLY 2 (TWO) TIMES DAILY. 30 g 1  . FOLIC ACID PO Take 1 tablet by mouth daily.      Marland Kitchen levothyroxine (SYNTHROID, LEVOTHROID) 137 MCG tablet Take 137 mcg by mouth daily.      . metoprolol (LOPRESSOR) 50 MG tablet TAKE 1&1/2 TABLETS BY MOUTH TWICE A DAY 90 tablet 5  . Multiple Vitamins-Minerals (EYE VITAMINS) CAPS Take by mouth.    . niacin  (NIASPAN) 1000 MG CR tablet Take 1,000 mg by mouth at bedtime.      . nitroGLYCERIN (NITROSTAT) 0.4 MG SL tablet Place 1 tablet (0.4 mg total) under the tongue every 5 (five) minutes as needed for chest pain. 25 tablet 1  . NONFORMULARY OR COMPOUNDED ITEM Insert 65ml into vagina twice a week. 24 each 4  . ramipril (ALTACE) 10 MG capsule Take 1 tablet by mouth Daily.    . rosuvastatin (CRESTOR) 20 MG tablet Take 20 mg by mouth daily.      . isosorbide mononitrate (IMDUR) 30 MG 24 hr tablet Take 0.5 tablets (15 mg total) by mouth daily. 90 tablet 3   No current facility-administered medications for this visit.    Past Medical History  Diagnosis Date  . Hypertension     well controlled  . Hyperlipidemia   . PAD (peripheral artery disease)     with stable claudication  . Hyperthyroidism   . Osteopenia   . Lupus anticoagulant disorder   . History of obesity   . Basal cell cancer     Nose  . B12 deficiency   . Cataract   . First degree atrioventricular block   . Colon polyps   .  Coronary artery disease     status post angioplasty of the mid RCA in 1995  . Carotid artery occlusion     Past Surgical History  Procedure Laterality Date  . Endarterectomy      right carotid endarterectomy  . Cholecystectomy  1999  . Repair tendons foot    . Meniscus repair Left 10/29/2012  . Carotid endarterectomy Right 2002  . Knee arthroscopy Left     FBX:UXYBFXO:VA colds or fevers,  weight decreased with 1 dose of lasix at least by 3 lbs.  Skin:no rashes or ulcers HEENT:no blurred vision, no congestion CV:see HPI PUL:see HPI GI:no diarrhea constipation or melena, no indigestion GU:no hematuria, no dysuria MS:no joint pain, no claudication Neuro:no syncope, no lightheadedness Endo:no diabetes, no thyroid disease  Wt Readings from Last 3 Encounters:  10/18/14 183 lb 11.2 oz (83.326 kg)  09/21/14 183 lb 8 oz (83.235 kg)  04/25/14 182 lb 12.8 oz (82.918 kg)    PHYSICAL EXAM BP 146/80  mmHg  Pulse 77  Ht 5\' 5"  (1.651 m)  Wt 183 lb 11.2 oz (83.326 kg)  BMI 30.57 kg/m2 General:Pleasant affect, NAD Skin:Warm and dry, brisk capillary refill HEENT:normocephalic, sclera clear, mucus membranes moist Neck:supple, no JVD, no bruits  Heart:S1S2 RRR without murmur, gallup, rub or click Lungs:clear without rales, rhonchi, or wheezes NVB:TYOM, non tender, + BS, do not palpate liver spleen or masses Ext:no lower ext edema, 2+ pedal pulses, 2+ radial pulses Neuro:alert and oriented, MAE, follows commands, + facial symmetry  EKG:SR poor R wave progression no acute changes except  The poor R wave progression   ASSESSMENT AND PLAN CHF (congestive heart failure), NYHA class II New, previous EF is normal last stress test was normal. Plan 2-D echo to evaluate systolic function. Continue her Lasix 20 mg daily for now.  Coronary artery disease History of angioplasty to the mid RCA in 1995 history and very well since that time. As this may be anginal equivalent as she never had chest pain we'll do a Lexiscan Myoview, she cannot walk on the treadmill to evaluate for ischemia as cause of shortness of breath.  I added Imdur 15 mg daily as well  Hyperlipidemia On Crestor.  Followed by PCP   Pt has follow up appt with Dr. P. Martinique in 2 weeks though if test are abnormal we will add further testing.

## 2014-10-18 NOTE — Assessment & Plan Note (Signed)
On Crestor.  Followed by PCP

## 2014-10-18 NOTE — Assessment & Plan Note (Signed)
History of angioplasty to the mid RCA in 1995 history and very well since that time. As this may be anginal equivalent as she never had chest pain we'll do a Lexiscan Myoview, she cannot walk on the treadmill to evaluate for ischemia as cause of shortness of breath.  I added Imdur 15 mg daily as well

## 2014-10-18 NOTE — Assessment & Plan Note (Signed)
New, previous EF is normal last stress test was normal. Plan 2-D echo to evaluate systolic function. Continue her Lasix 20 mg daily for now.

## 2014-10-18 NOTE — Patient Instructions (Signed)
We are ordering several test for you to get done , including a stress test and an Echo  Start taking Imdur 15 mg daily

## 2014-10-25 ENCOUNTER — Telehealth (HOSPITAL_COMMUNITY): Payer: Self-pay

## 2014-10-25 NOTE — Telephone Encounter (Signed)
Encounter complete. 

## 2014-10-27 ENCOUNTER — Ambulatory Visit (HOSPITAL_COMMUNITY)
Admission: RE | Admit: 2014-10-27 | Discharge: 2014-10-27 | Disposition: A | Discharge status: Home / Self Care | Payer: Medicare Other | Source: Ambulatory Visit | Attending: Cardiovascular Disease | Admitting: Cardiovascular Disease

## 2014-10-27 DIAGNOSIS — I208 Other forms of angina pectoris: Secondary | ICD-10-CM

## 2014-10-27 DIAGNOSIS — I209 Angina pectoris, unspecified: Secondary | ICD-10-CM | POA: Insufficient documentation

## 2014-10-27 DIAGNOSIS — R0600 Dyspnea, unspecified: Secondary | ICD-10-CM | POA: Insufficient documentation

## 2014-10-27 DIAGNOSIS — R0609 Other forms of dyspnea: Secondary | ICD-10-CM

## 2014-10-27 DIAGNOSIS — I517 Cardiomegaly: Secondary | ICD-10-CM

## 2014-10-27 MED ORDER — TECHNETIUM TC 99M SESTAMIBI GENERIC - CARDIOLITE
10.0000 | Freq: Once | INTRAVENOUS | Status: AC | PRN
  Administered 2014-10-27: 10 via INTRAVENOUS

## 2014-10-27 MED ORDER — TECHNETIUM TC 99M SESTAMIBI GENERIC - CARDIOLITE
30.0000 | Freq: Once | INTRAVENOUS | Status: AC | PRN
  Administered 2014-10-27: 30 via INTRAVENOUS

## 2014-10-27 MED ORDER — REGADENOSON 0.4 MG/5ML IV SOLN
0.4000 mg | Freq: Once | INTRAVENOUS | Status: AC
  Administered 2014-10-27: 0.4 mg via INTRAVENOUS

## 2014-10-27 NOTE — Procedures (Addendum)
[facility] [address] [place] [address] [phone]  Cardiology Nuclear Med Study  [name] is a 80 y.o. female     MRN : [id number]     DOB: [birth year]  Procedure Date: 10/27/2014  Nuclear Med Background Indication for Stress Test:  Stent Patency and Abnormal EKG History:  CAD;MI;STENT/PTCA-1995;CHF;Last NUC MPI on 03/03/2012-nonischemic;EF=74% Cardiac Risk Factors: Carotid Disease, Family History - CAD, History of Smoking, Hypertension, Lipids, Obesity, PVD and Lupus;Pedal edema;Anticoagulant disorder  Symptoms:  DOE, Palpitations and SOB   Nuclear Pre-Procedure Caffeine/Decaff Intake:  7:00pm NPO After: 5:00am   IV Site: R Forearm  IV 0.9% NS with Angio Cath:  22g  Chest Size (in):  n/a IV Started by: Rolene Course, RN  Height: 5\' 5"  (1.651 m)  Cup Size: B  BMI:  Body mass index is 30.45 kg/(m^2). Weight:  183 lb (83.008 kg)   Tech Comments:  n/a    Nuclear Med Study 1 or 2 day study: 1 day  Stress Test Type:  [place]  Order Authorizing Provider:  Peter Martinique, MD   Resting Radionuclide: Technetium 64m Sestamibi  Resting Radionuclide Dose: 10.2 mCi   Stress Radionuclide:  Technetium 84m Sestamibi  Stress Radionuclide Dose: 29.8 mCi           Stress Protocol Rest HR: 67 Stress HR:76  Rest BP:148/69 Stress BP: 156/53  Exercise Time (min): n/a METS: n/a          Dose of Adenosine (mg):  n/a Dose of Lexiscan: 0.4 mg  Dose of Atropine (mg): n/a Dose of Dobutamine: n/a mcg/kg/min (at max HR)  Stress Test Technologist: Mellody Memos, CCT Nuclear Technologist: Otho Perl, CNMT   Rest Procedure:  Myocardial perfusion imaging was performed at rest 45 minutes following the intravenous administration of Technetium 34m Sestamibi. Stress Procedure:  The patient received IV Lexiscan 0.4 mg over 15-seconds.  Technetium 57m Sestamibi injected IV at 30-seconds.  There were no significant changes with  Lexiscan.  Quantitative spect images were obtained after a 45 minute delay.  Transient Ischemic Dilatation (Normal <1.22):  1.25 QGS EDV:  77 ml QGS ESV:  18 ml LV Ejection Fraction: 76%  Rest ECG: NSR - Normal EKG  Stress ECG: No significant change from baseline ECG  QPS Raw Data Images:  Normal; no motion artifact; normal heart/lung ratio. Stress Images:  Mild anteroapical perfusion defect Rest Images:  Mild anteroapical perfusion defect Subtraction (SDS):  1  Impression Exercise Capacity:  Lexiscan with no exercise. BP Response:  Normal blood pressure response. Clinical Symptoms:  No symptoms. ECG Impression:  No significant ECG changes with Lexiscan. Comparison with Prior Nuclear Study: No significant change from previous study  Overall Impression:  Low risk stress nuclear study with mild distal anteroapical attenuation artifact. No reversible ischemia.  LV Wall Motion:  NL LV Function; NL Wall Motion; LVEF 76%  Pixie Casino, MD, [facility] Board Certified in Nuclear Cardiology Attending Cardiologist [place], MD  10/27/2014 1:12 PM

## 2014-10-27 NOTE — Progress Notes (Signed)
2D Echo Performed 10/27/2014    [name], RCS

## 2014-10-28 ENCOUNTER — Ambulatory Visit (INDEPENDENT_AMBULATORY_CARE_PROVIDER_SITE_OTHER): Payer: Medicare Other | Admitting: Gynecology

## 2014-10-28 ENCOUNTER — Encounter: Payer: Self-pay | Admitting: Gynecology

## 2014-10-28 VITALS — BP 136/78 | Ht 64.5 in | Wt 181.0 lb

## 2014-10-28 DIAGNOSIS — I208 Other forms of angina pectoris: Secondary | ICD-10-CM

## 2014-10-28 DIAGNOSIS — L900 Lichen sclerosus et atrophicus: Secondary | ICD-10-CM | POA: Diagnosis not present

## 2014-10-28 DIAGNOSIS — L292 Pruritus vulvae: Secondary | ICD-10-CM

## 2014-10-28 DIAGNOSIS — M8580 Other specified disorders of bone density and structure, unspecified site: Secondary | ICD-10-CM | POA: Diagnosis not present

## 2014-10-28 DIAGNOSIS — Z79890 Hormone replacement therapy (postmenopausal): Secondary | ICD-10-CM

## 2014-10-28 DIAGNOSIS — N952 Postmenopausal atrophic vaginitis: Secondary | ICD-10-CM

## 2014-10-28 MED ORDER — NONFORMULARY OR COMPOUNDED ITEM: Status: DC

## 2014-10-28 NOTE — Progress Notes (Signed)
[name] 07-Jun-1929 [id number]   History:    80 y.o.  for  GYN exam  In follow-up on her lichen sclerosus. Review of her record indicated that in the past she has had history of agglutination of the labia minora along with atrophic changes of the external genitalia and vagina. She was started on low-dose estrogen cream consisting of estradiol 0.02% which she applied twice weekly.  Patient was a prior patient of Dr. Ubaldo Glassing is retired. Review of the documentation that she brought with her indicated history her before for lichen sclerosus with clobetasol cream in the past which she is currently not using. Her PCP is Dr. Abner Greenspan who has been treating her for hypercholesterolemia, hypertension, and hypertriglyceridemia and blood sugars. Her last colonoscopy was in 2009 which was normal by Dr. Watt Climes. Her last mammogram was February this year which was normal. Patient denied any prior history of abnormal Pap smears in the past. Her last bone density study was  3 years ago as of osteopenia. Patient's vaccines are all up-to-date.   Patient is informing that her cardiologist recently did echocardiogram and stress test she was given a clean  [employer].  Past medical history,surgical history, family history and social history were all reviewed and documented in the EPIC chart.  Gynecologic History No LMP recorded. Patient is postmenopausal. Contraception: post menopausal status Last Pap:  4 years ago. Results were: normal Last mammogram:  2015. Results were: normal  Obstetric History OB History  Gravida Para Term Preterm AB SAB TAB Ectopic Multiple Living  3 3        3     # Outcome Date GA Lbr Len/2nd Weight Sex Delivery Anes PTL Lv  3 Para           2 Para           1 Para                ROS: A ROS was performed and pertinent positives and negatives are included in the history.  GENERAL: No fevers or chills. HEENT: No change in vision, no earache, sore throat or sinus congestion.  NECK: No pain or stiffness. CARDIOVASCULAR: No chest pain or pressure. No palpitations. PULMONARY: No shortness of breath, cough or wheeze. GASTROINTESTINAL: No abdominal pain, nausea, vomiting or diarrhea, melena or bright red blood per rectum. GENITOURINARY: No urinary frequency, urgency, hesitancy or dysuria. MUSCULOSKELETAL: No joint or muscle pain, no back pain, no recent trauma. DERMATOLOGIC: No rash, no itching, no lesions. ENDOCRINE: No polyuria, polydipsia, no heat or cold intolerance. No recent change in weight. HEMATOLOGICAL: No anemia or easy bruising or bleeding. NEUROLOGIC: No headache, seizures, numbness, tingling or weakness. PSYCHIATRIC: No depression, no loss of interest in normal activity or change in sleep pattern.     Exam: chaperone present  BP 136/78 mmHg  Ht 5' 4.5" (1.638 m)  Wt 181 lb (82.101 kg)  BMI 30.60 kg/m2  Body mass index is 30.6 kg/(m^2).  General appearance : Well developed well nourished female. No acute distress HEENT: Neck supple, trachea midline, no carotid bruits, no thyroidmegaly Lungs: Clear to auscultation, no rhonchi or wheezes, or rib retractions  Heart: Regular rate and rhythm, no murmurs or gallops Breast:Examined in sitting and supine position were symmetrical in appearance, no palpable masses or tenderness,  no skin retraction, no nipple inversion, no nipple discharge, no skin discoloration, no axillary or supraclavicular lymphadenopathy Abdomen: no palpable masses or tenderness, no rebound or guarding Extremities:  no edema or skin discoloration or tenderness  Pelvic:  Bartholin, Urethra, Skene Glands: Within normal limits             Vagina: No gross lesions or discharge , atrophic changes  Cervix: No gross lesions or discharge  Uterus   axial, normal size, shape and consistency, non-tender and mobile  Adnexa  Without masses or tenderness  Anus and perineum  normal   Rectovaginal  normal sphincter tone without palpated masses or  tenderness             Hemoccult    PCP provides    Assessment/Plan:  80 y.o. female  With history of lichen sclerosus doing well with clobetasol 0.5% twice a week. She is also a planned vaginal estrogen 0.02% twice a week inside the vagina for atrophy and irritation and has done well. She denies any vaginal bleeding. Her PCP we'll be doing her blood work. He will be doing a bone density study sometime next year. She was reminded to do her monthly self breast examination. We discussed importance of calcium vitamin D and regular exercise for osteoporosis prevention.  Terrance Mass MD, 10:21 AM 10/28/2014

## 2014-11-01 ENCOUNTER — Ambulatory Visit (INDEPENDENT_AMBULATORY_CARE_PROVIDER_SITE_OTHER): Payer: Medicare Other | Admitting: Cardiology

## 2014-11-01 ENCOUNTER — Encounter: Payer: Self-pay | Admitting: Cardiology

## 2014-11-01 VITALS — BP 150/66 | HR 82 | Ht 64.5 in | Wt 180.9 lb

## 2014-11-01 DIAGNOSIS — I2510 Atherosclerotic heart disease of native coronary artery without angina pectoris: Secondary | ICD-10-CM | POA: Diagnosis not present

## 2014-11-01 DIAGNOSIS — I739 Peripheral vascular disease, unspecified: Secondary | ICD-10-CM | POA: Diagnosis not present

## 2014-11-01 DIAGNOSIS — I10 Essential (primary) hypertension: Secondary | ICD-10-CM | POA: Diagnosis not present

## 2014-11-01 DIAGNOSIS — I5032 Chronic diastolic (congestive) heart failure: Secondary | ICD-10-CM | POA: Insufficient documentation

## 2014-11-01 DIAGNOSIS — I208 Other forms of angina pectoris: Secondary | ICD-10-CM | POA: Diagnosis not present

## 2014-11-01 LAB — BASIC METABOLIC PANEL
BUN: 30 mg/dL — AB (ref 6–23)
CALCIUM: 9.4 mg/dL (ref 8.4–10.5)
CO2: 24 mEq/L (ref 19–32)
Chloride: 102 mEq/L (ref 96–112)
Creat: 0.97 mg/dL (ref 0.50–1.10)
Glucose, Bld: 93 mg/dL (ref 70–99)
POTASSIUM: 4.4 meq/L (ref 3.5–5.3)
SODIUM: 138 meq/L (ref 135–145)

## 2014-11-01 MED ORDER — FUROSEMIDE 20 MG PO TABS: 20.0000 mg | ORAL_TABLET | Freq: Every day | ORAL | Status: DC

## 2014-11-01 NOTE — Progress Notes (Signed)
[name] Date of Birth: 1928/12/31   History of Present Illness: [name] is seen today for followup diastolic CHF. She has a history of coronary disease and had remote angioplasty of the mid right coronary in 1995.  Last  carotid dopplers showed 40-59% stenosis on the left. The right ICA is clear. She has a history of PAD.  She reports that at the end of October she developed a productive cough. She was treated with 3 days of antibiotics with improvement in cough but then she became SOB. She had increased edema. She was seen by Dr. Joylene Draft and CXR apparently showed mild edema. Peak flow was impaired. She was started on lasix 20 mg daily and isosorbide with improvement. Edema is less. Still a little SOB but close to baseline. No chest pain. Follow up Echo and Lexiscan Myoview noted below.   Current Outpatient Prescriptions on File Prior to Visit  Medication Sig Dispense Refill  . amLODipine (NORVASC) 5 MG tablet TAKE 1 TABLET BY MOUTH EVERY DAY 30 tablet 5  . aspirin 81 MG tablet Take 81 mg by mouth daily.      . cholecalciferol (VITAMIN D) 1000 UNITS tablet Take 1,000 Units by mouth daily.      . clobetasol cream (TEMOVATE) 9.52 % APPLY 1 APPLICATION TOPICALLY 2 (TWO) TIMES DAILY. 30 g 1  . FOLIC ACID PO Take 1 tablet by mouth daily.      . isosorbide mononitrate (IMDUR) 30 MG 24 hr tablet Take 0.5 tablets (15 mg total) by mouth daily. 90 tablet 3  . levothyroxine (SYNTHROID, LEVOTHROID) 137 MCG tablet Take 137 mcg by mouth daily.      . metoprolol (LOPRESSOR) 50 MG tablet TAKE 1&1/2 TABLETS BY MOUTH TWICE A DAY 90 tablet 5  . Multiple Vitamins-Minerals (EYE VITAMINS) CAPS Take by mouth.    . niacin (NIASPAN) 1000 MG CR tablet Take 1,000 mg by mouth at bedtime.      . nitroGLYCERIN (NITROSTAT) 0.4 MG SL tablet Place 1 tablet (0.4 mg total) under the tongue every 5 (five) minutes as needed for chest pain. 25 tablet 1  . NONFORMULARY OR COMPOUNDED ITEM Apply twice a week 24 each 4  .  ramipril (ALTACE) 10 MG capsule Take 1 tablet by mouth Daily.    . rosuvastatin (CRESTOR) 20 MG tablet Take 20 mg by mouth daily.       No current facility-administered medications on file prior to visit.    No Known Allergies  Past Medical History  Diagnosis Date  . Hypertension     well controlled  . Hyperlipidemia   . PAD (peripheral artery disease)     with stable claudication  . Hyperthyroidism   . Osteopenia   . History of obesity   . Basal cell cancer     Nose  . B12 deficiency   . Cataract   . First degree atrioventricular block   . Colon polyps   . Coronary artery disease     status post angioplasty of the mid RCA in 1995  . Carotid artery occlusion   . Chronic diastolic CHF (congestive heart failure)   . Lichen sclerosus et atrophicus of the vulva     Past Surgical History  Procedure Laterality Date  . Endarterectomy      right carotid endarterectomy  . Cholecystectomy  1999  . Repair tendons foot    . Meniscus repair Left 10/29/2012  . Carotid endarterectomy Right 2002  . Knee arthroscopy Left  History  Smoking status  . Former Smoker -- 1.00 packs/day for 40 years  . Types: Cigarettes  . Quit date: 11/18/1993  Smokeless tobacco  . Never Used    History  Alcohol Use No    Family History  Problem Relation Age of Onset  . Heart attack Father 21  . Hypertension Father   . Diabetes Sister   . Heart disease Brother     underwent coronary bypass grafting at age 40  . Hypertension Brother     Review of Systems: The review of systems is positive as noted above. All other systems were reviewed and are negative.  Physical Exam: BP 150/66 mmHg  Pulse 82  Ht 5' 4.5" (1.638 m)  Wt 180 lb 14.4 oz (82.056 kg)  BMI 30.58 kg/m2 She is an overweight white female in no acute distress. HEENT is normal.  Neck is without JVD, adenopathy, thyromegaly. There is a right carotid bruit. She has an old CEA scar. Lungs are clear. Cardiac exam reveals a  regular rate and rhythm without a gallop. There is a soft systolic ejection murmur the right upper sternal border. Abdomen is soft and nontender. She has no lower extremity edema. Pedal pulses are diminished. Skin is warm and dry. She is alert oriented x3. Cranial nerves II through XII are intact.  LABORATORY DATA: Echo: 10/27/14:Study Conclusions  - Left ventricle: The cavity size was normal. Wall thickness was normal. Systolic function was normal. The estimated ejection fraction was in the range of 55% to 60%. Wall motion was normal; there were no regional wall motion abnormalities. Features are consistent with a pseudonormal left ventricular filling pattern, with concomitant abnormal relaxation and increased filling pressure (grade 2 diastolic dysfunction). - Mitral valve: Calcified annulus. - Left atrium: The atrium was mildly dilated.  Impressions:  - Normal LV function; grade 2 diastolic dysfunction; mild LAE, trace TR.  Cardiology Nuclear Med Study  [name] is a 80 y.o. female MRN : [id number] DOB: [birth year]  Procedure Date: 10/27/2014  Nuclear Med Background Indication for Stress Test: Stent Patency and Abnormal EKG History: CAD;MI;STENT/PTCA-1995;CHF;Last NUC MPI on 03/03/2012-nonischemic;EF=74% Cardiac Risk Factors: Carotid Disease, Family History - CAD, History of Smoking, Hypertension, Lipids, Obesity, PVD and Lupus;Pedal edema;Anticoagulant disorder  Symptoms: DOE, Palpitations and SOB   Nuclear Pre-Procedure Caffeine/Decaff Intake: 7:00pm NPO After: 5:00am   IV Site: R Forearm  IV 0.9% NS with Angio Cath: 22g  Chest Size (in): n/a IV Started by: Rolene Course, RN  Height: 5\' 5"  (1.651 m)  Cup Size: B  BMI: Body mass index is 30.45 kg/(m^2). Weight: 183 lb (83.008 kg)   Tech Comments: n/a    Nuclear Med Study 1 or 2 day study: 1 day  Stress Test Type: [place]  Order Authorizing Provider: Quiana Cobaugh Martin[place], MD    Resting Radionuclide: Technetium 68m Sestamibi  Resting Radionuclide Dose: 10.2 mCi   Stress Radionuclide: Technetium 39m Sestamibi  Stress Radionuclide Dose: 29.8 mCi      Stress Protocol Rest HR: 67 Stress HR:76  Rest BP:148/69 Stress BP: 156/53  Exercise Time (min): n/a METS: n/a          Dose of Adenosine (mg): n/a Dose of Lexiscan: 0.4 mg  Dose of Atropine (mg): n/a Dose of Dobutamine: n/a mcg/kg/min (at max HR)  Stress Test Technologist: Mellody Memos, CCT Nuclear Technologist: Otho Perl, CNMT   Rest Procedure: Myocardial perfusion imaging was performed at rest 45 minutes following the intravenous administration of Technetium 67m Sestamibi. Stress Procedure: The  patient received IV Lexiscan 0.4 mg over 15-seconds. Technetium 28m Sestamibi injected IV at 30-seconds. There were no significant changes with Lexiscan. Quantitative spect images were obtained after a 45 minute delay.  Transient Ischemic Dilatation (Normal <1.22): 1.25 QGS EDV: 77 ml QGS ESV: 18 ml LV Ejection Fraction: 76%  Rest ECG: NSR - Normal EKG  Stress ECG: No significant change from baseline ECG  QPS Raw Data Images: Normal; no motion artifact; normal heart/lung ratio. Stress Images: Mild anteroapical perfusion defect Rest Images: Mild anteroapical perfusion defect Subtraction (SDS): 1  Impression Exercise Capacity: Lexiscan with no exercise. BP Response: Normal blood pressure response. Clinical Symptoms: No symptoms. ECG Impression: No significant ECG changes with Lexiscan. Comparison with Prior Nuclear Study: No significant change from previous study  Overall Impression: Low risk stress nuclear study with mild distal anteroapical attenuation artifact. No reversible ischemia.  LV Wall Motion: NL LV Function; NL Wall Motion; LVEF 76%  Pixie Casino, MD, [facility] Board Certified in Nuclear Cardiology Attending Cardiologist [place], MD  10/27/2014 1:12 PM   Assessment / Plan: 1. Recent acute diastolic CHF improved on diuretic therapy. Possibly exacerbated by acute bronchitis. Given clinical improvement with diuretic therapy I think COPD exacerbation is less likely although she doew have a history of tobacco abuse. Will continue current therapy and check a BMET and BNP level today. Follow up in 3 months.   2. Coronary disease with remote angioplasty of the right coronary in 1995. Myoview study in Dec. 2015 showed no significant ischemia and ejection fraction of 76%. Continue medical management with aspirin, metoprolol, amlodipine, and ACE inhibitor.  3.  PAD with chronic claudication. ABIs in November improved to .65 on right and .69 on left.   4. Carotid arterial disease status post right carotid endarterectomy. Dopplers in November stable.   5. Hypertension, controlled.  6. Hyperlipidemia well controlled on Crestor, niacin, and fish oil.

## 2014-11-01 NOTE — Patient Instructions (Signed)
Continue your current therapy  We will check lab work today  I will see you in 3 months.

## 2014-11-02 LAB — BRAIN NATRIURETIC PEPTIDE: Brain Natriuretic Peptide: 85.3 pg/mL (ref 0.0–100.0)

## 2014-11-04 ENCOUNTER — Other Ambulatory Visit: Payer: Self-pay

## 2014-11-04 MED ORDER — METOPROLOL TARTRATE 50 MG PO TABS: ORAL_TABLET | ORAL | Status: DC

## 2014-11-04 MED ORDER — AMLODIPINE BESYLATE 5 MG PO TABS
5.0000 mg | ORAL_TABLET | Freq: Every day | ORAL | Status: DC
Start: 2014-11-04 — End: 2015-05-23

## 2014-11-23 DIAGNOSIS — Z6830 Body mass index (BMI) 30.0-30.9, adult: Secondary | ICD-10-CM | POA: Diagnosis not present

## 2014-11-23 DIAGNOSIS — M810 Age-related osteoporosis without current pathological fracture: Secondary | ICD-10-CM | POA: Diagnosis not present

## 2014-11-24 ENCOUNTER — Encounter: Payer: Self-pay | Admitting: Cardiology

## 2014-12-19 ENCOUNTER — Other Ambulatory Visit: Payer: Self-pay | Admitting: Dermatology

## 2014-12-19 DIAGNOSIS — C4441 Basal cell carcinoma of skin of scalp and neck: Secondary | ICD-10-CM | POA: Diagnosis not present

## 2014-12-19 DIAGNOSIS — D225 Melanocytic nevi of trunk: Secondary | ICD-10-CM | POA: Diagnosis not present

## 2015-01-13 DIAGNOSIS — M7541 Impingement syndrome of right shoulder: Secondary | ICD-10-CM | POA: Diagnosis not present

## 2015-02-01 ENCOUNTER — Ambulatory Visit (INDEPENDENT_AMBULATORY_CARE_PROVIDER_SITE_OTHER): Payer: Medicare Other | Admitting: Cardiology

## 2015-02-01 ENCOUNTER — Encounter: Payer: Self-pay | Admitting: Cardiology

## 2015-02-01 VITALS — BP 126/60 | HR 64 | Ht 64.0 in | Wt 177.4 lb

## 2015-02-01 DIAGNOSIS — E785 Hyperlipidemia, unspecified: Secondary | ICD-10-CM

## 2015-02-01 DIAGNOSIS — I10 Essential (primary) hypertension: Secondary | ICD-10-CM | POA: Diagnosis not present

## 2015-02-01 DIAGNOSIS — I6523 Occlusion and stenosis of bilateral carotid arteries: Secondary | ICD-10-CM | POA: Diagnosis not present

## 2015-02-01 DIAGNOSIS — I5032 Chronic diastolic (congestive) heart failure: Secondary | ICD-10-CM

## 2015-02-01 DIAGNOSIS — I2510 Atherosclerotic heart disease of native coronary artery without angina pectoris: Secondary | ICD-10-CM

## 2015-02-01 NOTE — Progress Notes (Signed)
[name] Date of Birth: 05-May-1929   History of Present Illness: [name] is seen today for followup of diastolic CHF. She has a history of coronary disease and had remote angioplasty of the mid right coronary in 1995.  Last  carotid dopplers in Nov. 2015 showed 40-59% stenosis on the left. The right ICA is clear. She has a history of PAD. Last October she developed increased SOB and edema. Echo showed normal systolic function with grade 2 diastolic dysfunction. Myoview study was normal. On follow up today she is doing well from a cardiac standpoint. She denies SOB, CP, or edema. She is exercising regularly and is very active with volunteer work. She did fall and injured her shoulder 3 weeks ago but no fracture.   Current Outpatient Prescriptions on File Prior to Visit  Medication Sig Dispense Refill  . amLODipine (NORVASC) 5 MG tablet Take 1 tablet (5 mg total) by mouth daily. 30 tablet 6  . aspirin 81 MG tablet Take 81 mg by mouth daily.      . cholecalciferol (VITAMIN D) 1000 UNITS tablet Take 1,000 Units by mouth daily.      . clobetasol cream (TEMOVATE) 9.67 % APPLY 1 APPLICATION TOPICALLY 2 (TWO) TIMES DAILY. 30 g 1  . FOLIC ACID PO Take 1 tablet by mouth daily.      . furosemide (LASIX) 20 MG tablet Take 1 tablet (20 mg total) by mouth daily. 90 tablet 3  . isosorbide mononitrate (IMDUR) 30 MG 24 hr tablet Take 0.5 tablets (15 mg total) by mouth daily. 90 tablet 3  . levothyroxine (SYNTHROID, LEVOTHROID) 137 MCG tablet Take 137 mcg by mouth daily.      . metoprolol (LOPRESSOR) 50 MG tablet Take 1&1/2 twice a day 90 tablet 6  . Multiple Vitamins-Minerals (EYE VITAMINS) CAPS Take by mouth.    . nitroGLYCERIN (NITROSTAT) 0.4 MG SL tablet Place 1 tablet (0.4 mg total) under the tongue every 5 (five) minutes as needed for chest pain. 25 tablet 1  . ramipril (ALTACE) 10 MG capsule Take 1 tablet by mouth Daily.    . rosuvastatin (CRESTOR) 20 MG tablet Take 20 mg by mouth daily.      .  niacin (NIASPAN) 1000 MG CR tablet Take 1,000 mg by mouth at bedtime.      . NONFORMULARY OR COMPOUNDED ITEM Apply twice a week (Patient not taking: Reported on 02/01/2015) 24 each 4   No current facility-administered medications on file prior to visit.    No Known Allergies  Past Medical History  Diagnosis Date  . Hypertension     well controlled  . Hyperlipidemia   . PAD (peripheral artery disease)     with stable claudication  . Hyperthyroidism   . Osteopenia   . History of obesity   . Basal cell cancer     Nose  . B12 deficiency   . Cataract   . First degree atrioventricular block   . Colon polyps   . Coronary artery disease     status post angioplasty of the mid RCA in 1995  . Carotid artery occlusion   . Chronic diastolic CHF (congestive heart failure)   . Lichen sclerosus et atrophicus of the vulva     Past Surgical History  Procedure Laterality Date  . Endarterectomy      right carotid endarterectomy  . Cholecystectomy  1999  . Repair tendons foot    . Meniscus repair Left 10/29/2012  . Carotid endarterectomy Right 2002  .  Knee arthroscopy Left     History  Smoking status  . Former Smoker -- 1.00 packs/day for 40 years  . Types: Cigarettes  . Quit date: 11/18/1993  Smokeless tobacco  . Never Used    History  Alcohol Use No    Family History  Problem Relation Age of Onset  . Heart attack Father 80  . Hypertension Father   . Diabetes Sister   . Heart disease Brother     underwent coronary bypass grafting at age 2  . Hypertension Brother     Review of Systems: The review of systems is positive as noted above. All other systems were reviewed and are negative.  Physical Exam: BP 126/60 mmHg  Pulse 64  Ht 5\' 4"  (1.626 m)  Wt 177 lb 6 oz (80.457 kg)  BMI 30.43 kg/m2 She is an overweight white female in no acute distress. HEENT is normal.  Neck is without JVD, adenopathy, thyromegaly. There is a right carotid bruit. She has an old CEA scar.  Lungs are clear. Cardiac exam reveals a regular rate and rhythm without a gallop. There is a soft systolic ejection murmur the right upper sternal border. Abdomen is soft and nontender. She has no lower extremity edema. Pedal pulses are diminished. Skin is warm and dry. She is alert oriented x3. Cranial nerves II through XII are intact.  LABORATORY DATA:   Assessment / Plan: 1. Chronic diastolic CHF improved with diuretic therapy.  Will continue current therapy.   2. Coronary disease with remote angioplasty of the right coronary in 1995. Myoview study in Dec. 2015 showed no significant ischemia and ejection fraction of 76%. Continue medical management with aspirin, metoprolol, amlodipine, and ACE inhibitor.  3.  PAD with chronic claudication. ABIs in November improved to .65 on right and .69 on left.   4. Carotid arterial disease status post right carotid endarterectomy. Dopplers in November 2015 stable.   5. Hypertension, controlled.  6. Hyperlipidemia well controlled on Crestor, niacin, and fish oil. Labs monitored by Dr. Joylene Draft and due for follow up in June.

## 2015-02-01 NOTE — Patient Instructions (Signed)
Continue your current therapy  I will see you in 6 months.   

## 2015-02-19 ENCOUNTER — Other Ambulatory Visit: Payer: Self-pay | Admitting: Gynecology

## 2015-02-21 ENCOUNTER — Other Ambulatory Visit: Payer: Self-pay | Admitting: Gynecology

## 2015-02-27 ENCOUNTER — Other Ambulatory Visit (HOSPITAL_COMMUNITY): Payer: Self-pay | Admitting: Internal Medicine

## 2015-02-27 DIAGNOSIS — Z1231 Encounter for screening mammogram for malignant neoplasm of breast: Secondary | ICD-10-CM

## 2015-03-22 ENCOUNTER — Ambulatory Visit (HOSPITAL_COMMUNITY)
Admission: RE | Admit: 2015-03-22 | Discharge: 2015-03-22 | Disposition: A | Discharge status: Home / Self Care | Payer: Medicare Other | Source: Ambulatory Visit | Attending: Internal Medicine | Admitting: Internal Medicine

## 2015-03-22 DIAGNOSIS — Z1231 Encounter for screening mammogram for malignant neoplasm of breast: Secondary | ICD-10-CM | POA: Insufficient documentation

## 2015-04-13 DIAGNOSIS — I2510 Atherosclerotic heart disease of native coronary artery without angina pectoris: Secondary | ICD-10-CM | POA: Diagnosis not present

## 2015-04-13 DIAGNOSIS — I10 Essential (primary) hypertension: Secondary | ICD-10-CM | POA: Diagnosis not present

## 2015-04-13 DIAGNOSIS — N390 Urinary tract infection, site not specified: Secondary | ICD-10-CM | POA: Diagnosis not present

## 2015-04-13 DIAGNOSIS — N182 Chronic kidney disease, stage 2 (mild): Secondary | ICD-10-CM | POA: Diagnosis not present

## 2015-04-13 DIAGNOSIS — E785 Hyperlipidemia, unspecified: Secondary | ICD-10-CM | POA: Diagnosis not present

## 2015-04-13 DIAGNOSIS — E039 Hypothyroidism, unspecified: Secondary | ICD-10-CM | POA: Diagnosis not present

## 2015-04-13 DIAGNOSIS — E538 Deficiency of other specified B group vitamins: Secondary | ICD-10-CM | POA: Diagnosis not present

## 2015-04-13 DIAGNOSIS — R8299 Other abnormal findings in urine: Secondary | ICD-10-CM | POA: Diagnosis not present

## 2015-04-13 DIAGNOSIS — M8580 Other specified disorders of bone density and structure, unspecified site: Secondary | ICD-10-CM | POA: Diagnosis not present

## 2015-04-13 DIAGNOSIS — M859 Disorder of bone density and structure, unspecified: Secondary | ICD-10-CM | POA: Diagnosis not present

## 2015-04-13 DIAGNOSIS — R7301 Impaired fasting glucose: Secondary | ICD-10-CM | POA: Diagnosis not present

## 2015-04-18 DIAGNOSIS — Z1212 Encounter for screening for malignant neoplasm of rectum: Secondary | ICD-10-CM | POA: Diagnosis not present

## 2015-04-20 DIAGNOSIS — R601 Generalized edema: Secondary | ICD-10-CM | POA: Diagnosis not present

## 2015-04-20 DIAGNOSIS — E669 Obesity, unspecified: Secondary | ICD-10-CM | POA: Diagnosis not present

## 2015-04-20 DIAGNOSIS — R0602 Shortness of breath: Secondary | ICD-10-CM | POA: Diagnosis not present

## 2015-04-20 DIAGNOSIS — E538 Deficiency of other specified B group vitamins: Secondary | ICD-10-CM | POA: Diagnosis not present

## 2015-04-20 DIAGNOSIS — M549 Dorsalgia, unspecified: Secondary | ICD-10-CM | POA: Diagnosis not present

## 2015-04-20 DIAGNOSIS — Z6829 Body mass index (BMI) 29.0-29.9, adult: Secondary | ICD-10-CM | POA: Diagnosis not present

## 2015-04-20 DIAGNOSIS — Z0000 Encounter for general adult medical examination without abnormal findings: Secondary | ICD-10-CM | POA: Diagnosis not present

## 2015-04-20 DIAGNOSIS — N182 Chronic kidney disease, stage 2 (mild): Secondary | ICD-10-CM | POA: Diagnosis not present

## 2015-04-20 DIAGNOSIS — I2510 Atherosclerotic heart disease of native coronary artery without angina pectoris: Secondary | ICD-10-CM | POA: Diagnosis not present

## 2015-04-20 DIAGNOSIS — R809 Proteinuria, unspecified: Secondary | ICD-10-CM | POA: Diagnosis not present

## 2015-04-20 DIAGNOSIS — D649 Anemia, unspecified: Secondary | ICD-10-CM | POA: Diagnosis not present

## 2015-04-20 DIAGNOSIS — Z1389 Encounter for screening for other disorder: Secondary | ICD-10-CM | POA: Diagnosis not present

## 2015-04-21 ENCOUNTER — Encounter: Payer: Self-pay | Admitting: Cardiology

## 2015-04-24 DIAGNOSIS — Z961 Presence of intraocular lens: Secondary | ICD-10-CM | POA: Diagnosis not present

## 2015-04-24 DIAGNOSIS — H04123 Dry eye syndrome of bilateral lacrimal glands: Secondary | ICD-10-CM | POA: Diagnosis not present

## 2015-04-24 DIAGNOSIS — H16103 Unspecified superficial keratitis, bilateral: Secondary | ICD-10-CM | POA: Diagnosis not present

## 2015-05-16 ENCOUNTER — Other Ambulatory Visit: Payer: Self-pay | Admitting: Dermatology

## 2015-05-16 DIAGNOSIS — C4441 Basal cell carcinoma of skin of scalp and neck: Secondary | ICD-10-CM | POA: Diagnosis not present

## 2015-05-16 DIAGNOSIS — Z08 Encounter for follow-up examination after completed treatment for malignant neoplasm: Secondary | ICD-10-CM | POA: Diagnosis not present

## 2015-05-16 DIAGNOSIS — L821 Other seborrheic keratosis: Secondary | ICD-10-CM | POA: Diagnosis not present

## 2015-05-16 DIAGNOSIS — L570 Actinic keratosis: Secondary | ICD-10-CM | POA: Diagnosis not present

## 2015-05-16 DIAGNOSIS — Z85828 Personal history of other malignant neoplasm of skin: Secondary | ICD-10-CM | POA: Diagnosis not present

## 2015-05-18 DIAGNOSIS — Z86010 Personal history of colonic polyps: Secondary | ICD-10-CM | POA: Diagnosis not present

## 2015-05-18 DIAGNOSIS — K921 Melena: Secondary | ICD-10-CM | POA: Diagnosis not present

## 2015-05-19 ENCOUNTER — Other Ambulatory Visit: Payer: Self-pay

## 2015-05-19 MED ORDER — METOPROLOL TARTRATE 50 MG PO TABS: ORAL_TABLET | ORAL | Status: DC

## 2015-05-23 ENCOUNTER — Other Ambulatory Visit: Payer: Self-pay | Admitting: *Deleted

## 2015-05-23 MED ORDER — AMLODIPINE BESYLATE 5 MG PO TABS
5.0000 mg | ORAL_TABLET | Freq: Every day | ORAL | Status: DC
Start: 2015-05-23 — End: 2015-11-21

## 2015-05-25 DIAGNOSIS — K921 Melena: Secondary | ICD-10-CM | POA: Diagnosis not present

## 2015-05-29 ENCOUNTER — Telehealth: Payer: Self-pay | Admitting: Cardiology

## 2015-05-29 NOTE — Telephone Encounter (Signed)
Patient called stating that she has chronic PVC's but tonight seems to have more than she usually has.  She takes metoprolol 50mg  1 &1/2 tablets BID.  She says that usually after she takes her dose the PVC's will settle down but tonight they have not.  Her BP tonight 2 hours after taking her metoprolol was 154/50mmHg with a HR in the 80's.  I instructed her to take an addition 1/2 tablet of metoprolol and if PVC's do not settle down by the am to call Dr. Doug Sou office.

## 2015-06-02 DIAGNOSIS — M7541 Impingement syndrome of right shoulder: Secondary | ICD-10-CM | POA: Diagnosis not present

## 2015-06-07 ENCOUNTER — Other Ambulatory Visit (HOSPITAL_COMMUNITY): Payer: Self-pay | Admitting: *Deleted

## 2015-06-08 ENCOUNTER — Inpatient Hospital Stay (HOSPITAL_COMMUNITY): Admission: RE | Admit: 2015-06-08 | Payer: PRIVATE HEALTH INSURANCE | Source: Ambulatory Visit

## 2015-06-13 ENCOUNTER — Encounter (HOSPITAL_COMMUNITY)
Admission: RE | Admit: 2015-06-13 | Discharge: 2015-06-13 | Disposition: A | Discharge status: Home / Self Care | Payer: Medicare Other | Source: Ambulatory Visit | Attending: Endocrinology | Admitting: Endocrinology

## 2015-06-13 DIAGNOSIS — M810 Age-related osteoporosis without current pathological fracture: Secondary | ICD-10-CM | POA: Diagnosis not present

## 2015-06-13 MED ORDER — DENOSUMAB 60 MG/ML ~~LOC~~ SOLN
60.0000 mg | Freq: Once | SUBCUTANEOUS | Status: AC
  Administered 2015-06-13: 60 mg via SUBCUTANEOUS
  Filled 2015-06-13: qty 1

## 2015-06-15 ENCOUNTER — Encounter (HOSPITAL_COMMUNITY): Payer: PRIVATE HEALTH INSURANCE

## 2015-06-27 DIAGNOSIS — Z08 Encounter for follow-up examination after completed treatment for malignant neoplasm: Secondary | ICD-10-CM | POA: Diagnosis not present

## 2015-06-27 DIAGNOSIS — Z85828 Personal history of other malignant neoplasm of skin: Secondary | ICD-10-CM | POA: Diagnosis not present

## 2015-07-21 ENCOUNTER — Other Ambulatory Visit: Payer: Self-pay | Admitting: Gynecology

## 2015-07-25 ENCOUNTER — Encounter: Payer: Self-pay | Admitting: Cardiology

## 2015-07-25 ENCOUNTER — Ambulatory Visit (INDEPENDENT_AMBULATORY_CARE_PROVIDER_SITE_OTHER): Payer: Medicare Other | Admitting: Cardiology

## 2015-07-25 VITALS — BP 130/58 | HR 62 | Ht 65.0 in | Wt 173.5 lb

## 2015-07-25 DIAGNOSIS — E785 Hyperlipidemia, unspecified: Secondary | ICD-10-CM

## 2015-07-25 DIAGNOSIS — I6523 Occlusion and stenosis of bilateral carotid arteries: Secondary | ICD-10-CM

## 2015-07-25 DIAGNOSIS — I5032 Chronic diastolic (congestive) heart failure: Secondary | ICD-10-CM | POA: Diagnosis not present

## 2015-07-25 DIAGNOSIS — I2510 Atherosclerotic heart disease of native coronary artery without angina pectoris: Secondary | ICD-10-CM | POA: Diagnosis not present

## 2015-07-25 DIAGNOSIS — I739 Peripheral vascular disease, unspecified: Secondary | ICD-10-CM

## 2015-07-25 DIAGNOSIS — I10 Essential (primary) hypertension: Secondary | ICD-10-CM

## 2015-07-25 NOTE — Progress Notes (Signed)
[name] Date of Birth: [birth year]   History of Present Illness: [name] is seen today for followup of diastolic CHF and CAD. She has a history of coronary disease and had remote angioplasty of the mid right coronary in 1995.  Last  carotid dopplers in Nov. 2015 showed 40-59% stenosis on the left. The right ICA is clear. She has a history of PAD. In October 2015 she developed increased SOB and edema. Echo showed normal systolic function with grade 2 diastolic dysfunction. Myoview study was normal. She was started on lasix daily with resolution of her symptoms and edema.  On follow up today she is doing well from a cardiac standpoint. She denies SOB, CP, or edema. She is exercising regularly and is very active with volunteer work. She does have claudication when she walks longer distance L>R. No TIA or CVA symptoms.   Current Outpatient Prescriptions on File Prior to Visit  Medication Sig Dispense Refill  . amLODipine (NORVASC) 5 MG tablet Take 1 tablet (5 mg total) by mouth daily. 30 tablet 5  . aspirin 81 MG tablet Take 81 mg by mouth daily.      . cholecalciferol (VITAMIN D) 1000 UNITS tablet Take 1,000 Units by mouth daily.      . clobetasol cream (TEMOVATE) 3.87 % APPLY 1 APPLICATION TOPICALLY 2 (TWO) TIMES DAILY. 30 g 5  . Coenzyme Q10 (COQ10) 50 MG CAPS Take by mouth daily.    Marland Kitchen FOLIC ACID PO Take 1 tablet by mouth daily.      . furosemide (LASIX) 20 MG tablet Take 1 tablet (20 mg total) by mouth daily. 90 tablet 3  . isosorbide mononitrate (IMDUR) 30 MG 24 hr tablet Take 0.5 tablets (15 mg total) by mouth daily. 90 tablet 3  . levothyroxine (SYNTHROID, LEVOTHROID) 137 MCG tablet Take 137 mcg by mouth daily.      . metoprolol (LOPRESSOR) 50 MG tablet Take 1&1/2 twice a day 90 tablet 6  . Multiple Vitamins-Minerals (EYE VITAMINS) CAPS Take by mouth.    . nitroGLYCERIN (NITROSTAT) 0.4 MG SL tablet Place 1 tablet (0.4 mg total) under the tongue every 5 (five) minutes as needed for  chest pain. 25 tablet 1  . NONFORMULARY OR COMPOUNDED ITEM Apply twice a week 24 each 4  . ramipril (ALTACE) 10 MG capsule Take 1 tablet by mouth Daily.    . rosuvastatin (CRESTOR) 20 MG tablet Take 20 mg by mouth daily.       No current facility-administered medications on file prior to visit.    No Known Allergies  Past Medical History  Diagnosis Date  . Hypertension     well controlled  . Hyperlipidemia   . PAD (peripheral artery disease)     with stable claudication  . Hyperthyroidism   . Osteopenia   . History of obesity   . Basal cell cancer     Nose  . B12 deficiency   . Cataract   . First degree atrioventricular block   . Colon polyps   . Coronary artery disease     status post angioplasty of the mid RCA in 1995  . Carotid artery occlusion   . Chronic diastolic CHF (congestive heart failure)   . Lichen sclerosus et atrophicus of the vulva     Past Surgical History  Procedure Laterality Date  . Endarterectomy      right carotid endarterectomy  . Cholecystectomy  1999  . Repair tendons foot    . Meniscus repair  Left 10/29/2012  . Carotid endarterectomy Right 2002  . Knee arthroscopy Left     History  Smoking status  . Former Smoker -- 1.00 packs/day for 40 years  . Types: Cigarettes  . Quit date: 11/18/1993  Smokeless tobacco  . Never Used    History  Alcohol Use No    Family History  Problem Relation Age of Onset  . Heart attack Father 49  . Hypertension Father   . Diabetes Sister   . Heart disease Brother     underwent coronary bypass grafting at age 39  . Hypertension Brother     Review of Systems: The review of systems is  as noted above. All other systems were reviewed and are negative.  Physical Exam: BP 130/58 mmHg  Pulse 62  Ht 5\' 5"  (1.651 m)  Wt 78.699 kg (173 lb 8 oz)  BMI 28.87 kg/m2 She is an overweight white female in no acute distress. HEENT is normal.  Neck is without JVD, adenopathy, thyromegaly. There is a right  carotid bruit. She has an old CEA scar. Lungs are clear. Cardiac exam reveals a regular rate and rhythm without a gallop. There is a soft systolic ejection murmur the right upper sternal border. Abdomen is soft and nontender. She has no lower extremity edema. Pedal pulses are diminished. Feet are pink and warm. Skin is warm and dry. She is alert oriented x3. Cranial nerves II through XII are intact. LABORATORY DATA: Dated 04/20/15 from Dr. Joylene [name]- BUN 33, creatinine 0.9. Other chemistries are normal. A1c- 5.3%. Cholesterol 114. Triglycerides- 151, HDL- 41, LDL 43. TSH normal.   Assessment / Plan: 1. Chronic diastolic CHF asymptomatic on  diuretic therapy.  Will continue current therapy. Sodium restriction.  2. Coronary disease with remote angioplasty of the right coronary in 1995. Myoview study in Dec. 2015 showed no significant ischemia and ejection fraction of 76%. Continue medical management with aspirin, metoprolol, amlodipine, and ACE inhibitor.  3.  PAD with chronic stable claudication. ABIs in November 2015 improved to .65 on right and .69 on left. Scheduled for follow up with [facility] in November.  4. Carotid arterial disease status post right carotid endarterectomy. Dopplers in November 2015 stable.   5. Hypertension, controlled.  6. Hyperlipidemia well controlled on Crestor, niacin, and fish oil.   I will follow up in 6 months.

## 2015-07-25 NOTE — Patient Instructions (Signed)
Continue your current therapy  I will see you in 6 months.   

## 2015-08-12 DIAGNOSIS — S0990XA Unspecified injury of head, initial encounter: Secondary | ICD-10-CM | POA: Diagnosis not present

## 2015-08-12 DIAGNOSIS — S80212A Abrasion, left knee, initial encounter: Secondary | ICD-10-CM | POA: Diagnosis not present

## 2015-08-12 DIAGNOSIS — S61511A Laceration without foreign body of right wrist, initial encounter: Secondary | ICD-10-CM | POA: Diagnosis not present

## 2015-08-12 DIAGNOSIS — S0083XA Contusion of other part of head, initial encounter: Secondary | ICD-10-CM | POA: Diagnosis not present

## 2015-08-12 DIAGNOSIS — S61512A Laceration without foreign body of left wrist, initial encounter: Secondary | ICD-10-CM | POA: Diagnosis not present

## 2015-08-12 DIAGNOSIS — S0003XA Contusion of scalp, initial encounter: Secondary | ICD-10-CM | POA: Diagnosis not present

## 2015-08-12 DIAGNOSIS — S199XXA Unspecified injury of neck, initial encounter: Secondary | ICD-10-CM | POA: Diagnosis not present

## 2015-08-12 DIAGNOSIS — S8992XA Unspecified injury of left lower leg, initial encounter: Secondary | ICD-10-CM | POA: Diagnosis not present

## 2015-09-16 DIAGNOSIS — Z23 Encounter for immunization: Secondary | ICD-10-CM | POA: Diagnosis not present

## 2015-09-25 ENCOUNTER — Encounter: Payer: Self-pay | Admitting: Family

## 2015-09-27 ENCOUNTER — Encounter: Payer: Self-pay | Admitting: Family

## 2015-09-27 ENCOUNTER — Ambulatory Visit (INDEPENDENT_AMBULATORY_CARE_PROVIDER_SITE_OTHER)
Admission: RE | Admit: 2015-09-27 | Discharge: 2015-09-27 | Disposition: A | Discharge status: Home / Self Care | Payer: Medicare Other | Source: Ambulatory Visit | Attending: Family | Admitting: Family

## 2015-09-27 ENCOUNTER — Ambulatory Visit (INDEPENDENT_AMBULATORY_CARE_PROVIDER_SITE_OTHER): Payer: Medicare Other | Admitting: Family

## 2015-09-27 ENCOUNTER — Ambulatory Visit (HOSPITAL_COMMUNITY)
Admission: RE | Admit: 2015-09-27 | Discharge: 2015-09-27 | Disposition: A | Discharge status: Home / Self Care | Payer: Medicare Other | Source: Ambulatory Visit | Attending: Family | Admitting: Family

## 2015-09-27 ENCOUNTER — Other Ambulatory Visit: Payer: Self-pay | Admitting: Family

## 2015-09-27 VITALS — BP 156/70 | HR 70 | Temp 97.5°F | Resp 16 | Ht 64.5 in | Wt 175.0 lb

## 2015-09-27 DIAGNOSIS — I708 Atherosclerosis of other arteries: Secondary | ICD-10-CM

## 2015-09-27 DIAGNOSIS — I771 Stricture of artery: Secondary | ICD-10-CM

## 2015-09-27 DIAGNOSIS — I70219 Atherosclerosis of native arteries of extremities with intermittent claudication, unspecified extremity: Secondary | ICD-10-CM

## 2015-09-27 DIAGNOSIS — I6523 Occlusion and stenosis of bilateral carotid arteries: Secondary | ICD-10-CM | POA: Diagnosis not present

## 2015-09-27 DIAGNOSIS — Z48812 Encounter for surgical aftercare following surgery on the circulatory system: Secondary | ICD-10-CM | POA: Insufficient documentation

## 2015-09-27 DIAGNOSIS — I739 Peripheral vascular disease, unspecified: Secondary | ICD-10-CM

## 2015-09-27 DIAGNOSIS — Z9889 Other specified postprocedural states: Secondary | ICD-10-CM | POA: Diagnosis not present

## 2015-09-27 NOTE — Progress Notes (Signed)
[facility] [place] [facility] HISTORY AND PHYSICAL   MRN : [id number]  History of Present Illness:   [name] is a 81 y.o. female that Dr. Scot Dock has been following status post right CEA on 03/09/2001 and for PAD. She returns today for follow up.  She did have evidence of chronic venous insufficiency and she was encouraged to continue her water aerobics and also to elevate her legs intermittently. She has less claudication symptoms in her calves with walking, denies non healing wounds.  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 She had a torn meniscus in left knee, was repaired Dec. 2013, and left leg feels better.  She has no history of TIA or stroke symptoms. Specifically the patient denies a history of amaurosis fugax or monocular blindness, unilateral facial drooping, hemiplegia, or receptive or expressive aphasia.   Pt states she wants her results to go to Dr. Peter Martinique also.   She denies history of MI but had ischemic heart disease and had cardiac stent placed in 1995, quit smoking in 1995, she does not have DM. She states that she has known PVC's, can go weeks without noticing any, then have lots of PVC's at other times, states her PCP and cardiologist are aware of this.  She has been diagnosed with CHF in November 2015.  Pt meds include: Statin :Yes Betablocker: Yes ASA: Yes Other anticoagulants/antiplatelets: no    Current Outpatient Prescriptions  Medication Sig Dispense Refill  . amLODipine (NORVASC) 5 MG tablet Take 1 tablet (5 mg total) by mouth daily. 30 tablet 5  . aspirin 81 MG tablet Take 81 mg by mouth daily.      . cholecalciferol (VITAMIN D) 1000 UNITS tablet Take 1,000 Units by mouth daily.      . clobetasol cream (TEMOVATE) 3.01 % APPLY 1 APPLICATION TOPICALLY 2 (TWO) TIMES DAILY. 30 g 5  . Coenzyme Q10 (COQ10) 50 MG CAPS Take by mouth daily.    Marland Kitchen  FOLIC ACID PO Take 1 tablet by mouth daily.      . furosemide (LASIX) 20 MG tablet Take 1 tablet (20 mg total) by mouth daily. 90 tablet 3  . isosorbide mononitrate (IMDUR) 30 MG 24 hr tablet Take 0.5 tablets (15 mg total) by mouth daily. 90 tablet 3  . levothyroxine (SYNTHROID, LEVOTHROID) 137 MCG tablet Take 137 mcg by mouth daily.      . metoprolol (LOPRESSOR) 50 MG tablet Take 1&1/2 twice a day 90 tablet 6  . Multiple Vitamins-Minerals (EYE VITAMINS) CAPS Take by mouth.    . nitroGLYCERIN (NITROSTAT) 0.4 MG SL tablet Place 1 tablet (0.4 mg total) under the tongue every 5 (five) minutes as needed for chest pain. 25 tablet 1  . NONFORMULARY OR COMPOUNDED ITEM Apply twice a week 24 each 4  . ramipril (ALTACE) 10 MG capsule Take 1 tablet by mouth Daily.    . rosuvastatin (CRESTOR) 20 MG tablet Take 20 mg by mouth daily.       No current facility-administered medications for this visit.    Past Medical History  Diagnosis Date  . Hypertension     well controlled  . Hyperlipidemia   . PAD (peripheral artery disease) (HCC)     with stable claudication  . Hyperthyroidism   . Osteopenia   . History of obesity   . Basal cell cancer     Nose  . B12  deficiency   . Cataract   . First degree atrioventricular block   . Colon polyps   . Coronary artery disease     status post angioplasty of the mid RCA in 1995  . Carotid artery occlusion   . Chronic diastolic CHF (congestive heart failure) ([place])   . Lichen sclerosus et atrophicus of the vulva     Social History Social History  Substance Use Topics  . Smoking status: Former Smoker -- 1.00 packs/day for 40 years    Types: Cigarettes    Quit date: 11/18/1993  . Smokeless tobacco: Never Used  . Alcohol Use: No    Family History Family History  Problem Relation Age of Onset  . Heart attack Father 72  . Hypertension Father   . Diabetes Sister   . Heart disease Brother     underwent coronary bypass grafting at age 93  .  Hypertension Brother     Surgical History Past Surgical History  Procedure Laterality Date  . Endarterectomy      right carotid endarterectomy  . Cholecystectomy  1999  . Repair tendons foot    . Meniscus repair Left 10/29/2012  . Carotid endarterectomy Right 2002  . Knee arthroscopy Left     No Known Allergies  Current Outpatient Prescriptions  Medication Sig Dispense Refill  . amLODipine (NORVASC) 5 MG tablet Take 1 tablet (5 mg total) by mouth daily. 30 tablet 5  . aspirin 81 MG tablet Take 81 mg by mouth daily.      . cholecalciferol (VITAMIN D) 1000 UNITS tablet Take 1,000 Units by mouth daily.      . clobetasol cream (TEMOVATE) 6.64 % APPLY 1 APPLICATION TOPICALLY 2 (TWO) TIMES DAILY. 30 g 5  . Coenzyme Q10 (COQ10) 50 MG CAPS Take by mouth daily.    Marland Kitchen FOLIC ACID PO Take 1 tablet by mouth daily.      . furosemide (LASIX) 20 MG tablet Take 1 tablet (20 mg total) by mouth daily. 90 tablet 3  . isosorbide mononitrate (IMDUR) 30 MG 24 hr tablet Take 0.5 tablets (15 mg total) by mouth daily. 90 tablet 3  . levothyroxine (SYNTHROID, LEVOTHROID) 137 MCG tablet Take 137 mcg by mouth daily.      . metoprolol (LOPRESSOR) 50 MG tablet Take 1&1/2 twice a day 90 tablet 6  . Multiple Vitamins-Minerals (EYE VITAMINS) CAPS Take by mouth.    . nitroGLYCERIN (NITROSTAT) 0.4 MG SL tablet Place 1 tablet (0.4 mg total) under the tongue every 5 (five) minutes as needed for chest pain. 25 tablet 1  . NONFORMULARY OR COMPOUNDED ITEM Apply twice a week 24 each 4  . ramipril (ALTACE) 10 MG capsule Take 1 tablet by mouth Daily.    . rosuvastatin (CRESTOR) 20 MG tablet Take 20 mg by mouth daily.       No current facility-administered medications for this visit.     REVIEW OF SYSTEMS: See HPI for pertinent positives and negatives.  Physical Examination Filed Vitals:   09/27/15 1520 09/27/15 1528  BP: 111/54 156/70  Pulse: 59 70  Temp: 97.5 F (36.4 C)   Resp: 16   Height: 5' 4.5" (1.638 m)    Weight: 175 lb (79.379 kg)   SpO2: 90%    Body mass index is 29.59 kg/(m^2).  General: WDWN in NAD, obese female. Gait: Limp HENT: WNL Eyes: PERRLA Pulmonary: normal non-labored breathing , without Rales, rhonchi, or Wheezing, occasional moist cough. Cardiac: Irregular, no detected murmur  Abdomen: soft,  NT, no palpated masses Skin: no rashes, no ulcers, no cellulitis.   Vascular Exam/Pulses: VASCULAR EXAM  Carotid Bruits Left Right   Negative Positive   Aorta is not palpable. Radial pulses right is 2+, left is not palpable. Brachial pulses: 3+ bilaterally.   VASCULAR EXAM: Extremities without ischemic changes  without Gangrene; without open wounds. Hemosiderin deposits left lateral calf, trace pitting edema in both lower legs.     LE Pulses LEFT RIGHT   FEMORAL  palpable  palpable    POPLITEAL not palpable  not palpable   POSTERIOR TIBIAL not palpable  not palpable    DORSALIS PEDIS  ANTERIOR TIBIAL not palpable  not palpable      Musculoskeletal: no muscle wasting or atrophy. Neurologic: A&O X 3; Appropriate Affect ;  SENSATION: normal; MOTOR FUNCTION: 5/5 Symmetric, CN 2-12 intact Speech is fluent/normal         Non-Invasive Vascular Imaging (09/27/2015):   CEREBROVASCULAR DUPLEX EVALUATION    INDICATION: Carotid artery stenosis    PREVIOUS INTERVENTION(S): Right carotid endarterectomy 03/09/2001    DUPLEX EXAM:     RIGHT  LEFT  Peak Systolic Velocities (cm/s) End Diastolic Velocities (cm/s) Plaque LOCATION Peak Systolic Velocities (cm/s) End Diastolic Velocities (cm/s) Plaque  175 17  CCA PROXIMAL 128 13   104 14 HT CCA MID 102 16 HT  89 13 HT CCA DISTAL 92 9 HT  115 10 HT ECA 180 17 HT  95 15  ICA  PROXIMAL 142 28 HT  104 28  ICA MID 95 18   101 17  ICA DISTAL 92 15     Carotid endarterectomy ICA / CCA Ratio (PSV) 1.54  Antegrade Vertebral Flow Retrograde  654 Brachial Systolic Pressure (mmHg) 650  Triphasic Brachial Artery Waveforms Biphasic    Plaque Morphology:  HM = Homogeneous, HT = Heterogeneous, CP = Calcific Plaque, SP = Smooth Plaque, IP = Irregular Plaque     ADDITIONAL FINDINGS: Right subclavian artery PSV134cm/sec; Left subclavian artery PSV210cm/sec    IMPRESSION: Right internal carotid artery is patent with history of carotid endarterectomy, no hyperplasia or hemodynamically significant plaque present. Left internal carotid artery stenosis present in the less than 40% range. Brachial pressure gradient present with retrograde left vertebral artery and stenotic left subclavian artery.    Compared to the previous exam:  Essentially unchanged since previous study on 09/21/2014.   ABI (Date: 09/27/2015)  R: 0.48 (0.69, 09/21/14), DP: monophasic, PT: biphasic, TBI: 0.56  L: 0.61 (0.65), DP: biphasic, PT: monophasic, TBI: 0.54    ASSESSMENT:  [name] is a 81 y.o. female who is s/p status post right CEA on 03/09/2001 and is also followed for PAD. She has no history of stroke or TIA, does have known left subclavian stenosis but no steal symptoms. Carotid Duplex today suggests a  patent right ICA with history of carotid endarterectomy, no hyperplasia or hemodynamically significant plaque, and minimal left ICA stenosis. Brachial pressure gradient present with retrograde left vertebral artery and stenotic left subclavian artery.  Essentially unchanged since previous study on 09/21/2014.  ABI's are decreased in the right (severe) and stable in the left (moderate), but her claudication symptoms with walking seem improved to her. She does a great deal of walking. She has no signs of ischemia in her legs/feet.    PLAN:   Continue graduated walking program.   Based  on today's exam and non-invasive vascular lab results, the patient will follow up in 1 year with the following tests:  ABI's and carotid duplex. She knows to notify us should she have concerns about the circulation in her legs.  I discussed in depth with the patient the nature of atherosclerosis, and emphasized the importance of maximal medical management including strict control of blood pressure, blood glucose, and lipid levels, obtaining regular exercise, and cessation of smoking.  The patient is aware that without maximal medical management the underlying atherosclerotic disease process will progress, limiting the benefit of any interventions.  The patient was given information about stroke prevention and what symptoms should prompt the patient to seek immediate medical care.  The patient was given information about PAD including signs, symptoms, treatment, what symptoms should prompt the patient to seek immediate medical care, and risk reduction measures to take. Thank you for allowing [place] to participate in this patient's care.  Clemon Chambers, RN, MSN, FNP-C [facility] Office: [phone]  Clinic MD: Scot Dock  09/27/2015 3:46 PM

## 2015-09-27 NOTE — Patient Instructions (Signed)
Stroke Prevention Some medical conditions and behaviors are associated with an increased chance of having a stroke. You may prevent a stroke by making healthy choices and managing medical conditions. HOW CAN I REDUCE MY RISK OF HAVING A STROKE?   Stay physically active. Get at least 30 minutes of activity on most or all days.  Do not smoke. It may also be helpful to avoid exposure to secondhand smoke.  Limit alcohol use. Moderate alcohol use is considered to be:  No more than 2 drinks per day for men.  No more than 1 drink per day for nonpregnant women.  Eat healthy foods. This involves:  Eating 5 or more servings of fruits and vegetables a day.  Making dietary changes that address high blood pressure (hypertension), high cholesterol, diabetes, or obesity.  Manage your cholesterol levels.  Making food choices that are high in fiber and low in saturated fat, trans fat, and cholesterol may control cholesterol levels.  Take any prescribed medicines to control cholesterol as directed by your health care provider.  Manage your diabetes.  Controlling your carbohydrate and sugar intake is recommended to manage diabetes.  Take any prescribed medicines to control diabetes as directed by your health care provider.  Control your hypertension.  Making food choices that are low in salt (sodium), saturated fat, trans fat, and cholesterol is recommended to manage hypertension.  Ask your health care provider if you need treatment to lower your blood pressure. Take any prescribed medicines to control hypertension as directed by your health care provider.  If you are 18-39 years of age, have your blood pressure checked every 3-5 years. If you are 40 years of age or older, have your blood pressure checked every year.  Maintain a healthy weight.  Reducing calorie intake and making food choices that are low in sodium, saturated fat, trans fat, and cholesterol are recommended to manage  weight.  Stop drug abuse.  Avoid taking birth control pills.  Talk to your health care provider about the risks of taking birth control pills if you are over 35 years old, smoke, get migraines, or have ever had a blood clot.  Get evaluated for sleep disorders (sleep apnea).  Talk to your health care provider about getting a sleep evaluation if you snore a lot or have excessive sleepiness.  Take medicines only as directed by your health care provider.  For some people, aspirin or blood thinners (anticoagulants) are helpful in reducing the risk of forming abnormal blood clots that can lead to stroke. If you have the irregular heart rhythm of atrial fibrillation, you should be on a blood thinner unless there is a good reason you cannot take them.  Understand all your medicine instructions.  Make sure that other conditions (such as anemia or atherosclerosis) are addressed. SEEK IMMEDIATE MEDICAL CARE IF:   You have sudden weakness or numbness of the face, arm, or leg, especially on one side of the body.  Your face or eyelid droops to one side.  You have sudden confusion.  You have trouble speaking (aphasia) or understanding.  You have sudden trouble seeing in one or both eyes.  You have sudden trouble walking.  You have dizziness.  You have a loss of balance or coordination.  You have a sudden, severe headache with no known cause.  You have new chest pain or an irregular heartbeat. Any of these symptoms may represent a serious problem that is an emergency. Do not wait to see if the symptoms will   go away. Get medical help at once. Call your local emergency services (911 in [place] Do not drive yourself to the hospital.   This information is not intended to replace advice given to you by your health care provider. Make sure you discuss any questions you have with your health care provider.   Document Released: 12/12/2004 Document Revised: 11/25/2014 Document Reviewed:  05/07/2013 Elsevier Interactive Patient Education 2016 Elsevier Inc.    Peripheral Vascular Disease Peripheral vascular disease (PVD) is a disease of the blood vessels that are not part of your heart and brain. A simple term for PVD is poor circulation. In most cases, PVD narrows the blood vessels that carry blood from your heart to the rest of your body. This can result in a decreased supply of blood to your arms, legs, and internal organs, like your stomach or kidneys. However, it most often affects a person's lower legs and feet. There are two types of PVD.  Organic PVD. This is the more common type. It is caused by damage to the structure of blood vessels.  Functional PVD. This is caused by conditions that make blood vessels contract and tighten (spasm). Without treatment, PVD tends to get worse over time. PVD can also lead to acute ischemic limb. This is when an arm or limb suddenly has trouble getting enough blood. This is a medical emergency. CAUSES Each type of PVD has many different causes. The most common cause of PVD is buildup of a fatty material (plaque) inside of your arteries (atherosclerosis). Small amounts of plaque can break off from the walls of the blood vessels and become lodged in a smaller artery. This blocks blood flow and can cause acute ischemic limb. Other common causes of PVD include:  Blood clots that form inside of blood vessels.  Injuries to blood vessels.  Diseases that cause inflammation of blood vessels or cause blood vessel spasms.  Health behaviors and health history that increase your risk of developing PVD. RISK FACTORS  You may have a greater risk of PVD if you:  Have a family history of PVD.  Have certain medical conditions, including:  High cholesterol.  Diabetes.  High blood pressure (hypertension).  Coronary heart disease.  Past problems with blood clots.  Past injury, such as burns or a broken bone. These may have damaged blood  vessels in your limbs.  Buerger disease. This is caused by inflamed blood vessels in your hands and feet.  Some forms of arthritis.  Rare birth defects that affect the arteries in your legs.  Use tobacco.  Do not get enough exercise.  Are obese.  Are age 50 or older. SIGNS AND SYMPTOMS  PVD may cause many different symptoms. Your symptoms depend on what part of your body is not getting enough blood. Some common signs and symptoms include:  Cramps in your lower legs. This may be a symptom of poor leg circulation (claudication).  Pain and weakness in your legs while you are physically active that goes away when you rest (intermittent claudication).  Leg pain when at rest.  Leg numbness, tingling, or weakness.  Coldness in a leg or foot, especially when compared with the other leg.  Skin or hair changes. These can include:  Hair loss.  Shiny skin.  Pale or bluish skin.  Thick toenails.  Inability to get or maintain an erection (erectile dysfunction). People with PVD are more prone to developing ulcers and sores on their toes, feet, or legs. These may take longer than   normal to heal. DIAGNOSIS Your health care provider may diagnose PVD from your signs and symptoms. The health care provider will also do a physical exam. You may have tests to find out what is causing your PVD and determine its severity. Tests may include:  Blood pressure recordings from your arms and legs and measurements of the strength of your pulses (pulse volume recordings).  Imaging studies using sound waves to take pictures of the blood flow through your blood vessels (Doppler ultrasound).  Injecting a dye into your blood vessels before having imaging studies using:  X-rays (angiogram or arteriogram).  Computer-generated X-rays (CT angiogram).  A powerful electromagnetic field and a computer (magnetic resonance angiogram or MRA). TREATMENT Treatment for PVD depends on the cause of your condition  and the severity of your symptoms. It also depends on your age. Underlying causes need to be treated and controlled. These include long-lasting (chronic) conditions, such as diabetes, high cholesterol, and high blood pressure. You may need to first try making lifestyle changes and taking medicines. Surgery may be needed if these do not work. Lifestyle changes may include:  Quitting smoking.  Exercising regularly.  Following a low-fat, low-cholesterol diet. Medicines may include:  Blood thinners to prevent blood clots.  Medicines to improve blood flow.  Medicines to improve your blood cholesterol levels. Surgical procedures may include:  A procedure that uses an inflated balloon to open a blocked artery and improve blood flow (angioplasty).  A procedure to put in a tube (stent) to keep a blocked artery open (stent implant).  Surgery to reroute blood flow around a blocked artery (peripheral bypass surgery).  Surgery to remove dead tissue from an infected wound on the affected limb.  Amputation. This is surgical removal of the affected limb. This may be necessary in cases of acute ischemic limb that are not improved through medical or surgical treatments. HOME CARE INSTRUCTIONS  Take medicines only as directed by your health care provider.  Do not use any tobacco products, including cigarettes, chewing tobacco, or electronic cigarettes. If you need help quitting, ask your health care provider.  Lose weight if you are overweight, and maintain a healthy weight as directed by your health care provider.  Eat a diet that is low in fat and cholesterol. If you need help, ask your health care provider.  Exercise regularly. Ask your health care provider to suggest some good activities for you.  Use compression stockings or other mechanical devices as directed by your health care provider.  Take good care of your feet.  Wear comfortable shoes that fit well.  Check your feet often for  any cuts or sores. SEEK MEDICAL CARE IF:  You have cramps in your legs while walking.  You have leg pain when you are at rest.  You have coldness in a leg or foot.  Your skin changes.  You have erectile dysfunction.  You have cuts or sores on your feet that are not healing. SEEK IMMEDIATE MEDICAL CARE IF:  Your arm or leg turns cold and blue.  Your arms or legs become red, warm, swollen, painful, or numb.  You have chest pain or trouble breathing.  You suddenly have weakness in your face, arm, or leg.  You become very confused or lose the ability to speak.  You suddenly have a very bad headache or lose your vision.   This information is not intended to replace advice given to you by your health care provider. Make sure you discuss any questions   you have with your health care provider.   Document Released: 12/12/2004 Document Revised: 11/25/2014 Document Reviewed: 04/14/2014 Elsevier Interactive Patient Education 2016 Elsevier Inc.  

## 2015-10-23 ENCOUNTER — Other Ambulatory Visit: Payer: Self-pay

## 2015-10-23 MED ORDER — FUROSEMIDE 20 MG PO TABS: 20.0000 mg | ORAL_TABLET | Freq: Every day | ORAL | Status: DC

## 2015-10-23 NOTE — Telephone Encounter (Signed)
Peter M Martinique, MD at 07/25/2015 9:42 AM  furosemide (LASIX) 20 MG tablet Take 1 tablet (20 mg total) by mouth daily Patient Instructions     Continue your current therapy  I will see you in 6 months.

## 2015-10-30 DIAGNOSIS — Z85828 Personal history of other malignant neoplasm of skin: Secondary | ICD-10-CM | POA: Diagnosis not present

## 2015-10-30 DIAGNOSIS — L570 Actinic keratosis: Secondary | ICD-10-CM | POA: Diagnosis not present

## 2015-10-30 DIAGNOSIS — C44319 Basal cell carcinoma of skin of other parts of face: Secondary | ICD-10-CM | POA: Diagnosis not present

## 2015-10-30 DIAGNOSIS — C4441 Basal cell carcinoma of skin of scalp and neck: Secondary | ICD-10-CM | POA: Diagnosis not present

## 2015-11-02 ENCOUNTER — Encounter: Payer: Self-pay | Admitting: Gynecology

## 2015-11-02 ENCOUNTER — Ambulatory Visit (INDEPENDENT_AMBULATORY_CARE_PROVIDER_SITE_OTHER): Payer: Medicare Other | Admitting: Gynecology

## 2015-11-02 VITALS — BP 130/70 | Ht 64.25 in | Wt 178.6 lb

## 2015-11-02 DIAGNOSIS — M8580 Other specified disorders of bone density and structure, unspecified site: Secondary | ICD-10-CM

## 2015-11-02 DIAGNOSIS — Z79890 Hormone replacement therapy (postmenopausal): Secondary | ICD-10-CM

## 2015-11-02 DIAGNOSIS — I70219 Atherosclerosis of native arteries of extremities with intermittent claudication, unspecified extremity: Secondary | ICD-10-CM | POA: Diagnosis not present

## 2015-11-02 DIAGNOSIS — N952 Postmenopausal atrophic vaginitis: Secondary | ICD-10-CM | POA: Diagnosis not present

## 2015-11-02 DIAGNOSIS — Z01419 Encounter for gynecological examination (general) (routine) without abnormal findings: Secondary | ICD-10-CM | POA: Diagnosis not present

## 2015-11-02 MED ORDER — CLOBETASOL PROPIONATE 0.05 % EX CREA: TOPICAL_CREAM | CUTANEOUS | Status: DC

## 2015-11-02 MED ORDER — NONFORMULARY OR COMPOUNDED ITEM: Status: DC

## 2015-11-02 NOTE — Progress Notes (Signed)
[name] December 06, 1928 [id number]   History:    81 y.o.  for annual gyn exam with no complaints today. Patient has done well with her twice a week vaginal estrogen for her vaginal atrophy and prior labial coordination. She has done well also with the clobetasol cream which she has apply to external vulva twice a week for her lichen sclerosus.  Patient was a prior patient of Dr. Ubaldo Glassing who retired several years ago. We begin seeing patient for the first time last year. Review of the documentation that she brought with her indicated history her before for lichen sclerosus with clobetasol cream in the past which she is currently not using. Her PCP is Dr. Abner Greenspan who has been treating her for hypercholesterolemia, hypertension, and hypertriglyceridemia and blood sugars. Her last colonoscopy was in 2009 which was normal by Dr. Watt Climes. Her last mammogram was February this year which was normal. Patient denied any prior history of abnormal Pap smears in the past.   Patient states that her PCP has been doing her bone density study and that she had one in 2014 and is scheduled for 1 2016 for which we have no report  Past medical history,surgical history, family history and social history were all reviewed and documented in the EPIC chart.  Gynecologic History No LMP recorded. Patient is postmenopausal. Contraception: post menopausal status Last Pap: Several years ago. Results were: normal Last mammogram: 2016. Results were: normal  Obstetric History OB History  Gravida Para Term Preterm AB SAB TAB Ectopic Multiple Living  3 3        3     # Outcome Date GA Lbr Len/2nd Weight Sex Delivery Anes PTL Lv  3 Para           2 Para           1 Para                ROS: A ROS was performed and pertinent positives and negatives are included in the history.  GENERAL: No fevers or chills. HEENT: No change in vision, no earache, sore throat or sinus congestion. NECK: No pain or stiffness.  CARDIOVASCULAR: No chest pain or pressure. No palpitations. PULMONARY: No shortness of breath, cough or wheeze. GASTROINTESTINAL: No abdominal pain, nausea, vomiting or diarrhea, melena or bright red blood per rectum. GENITOURINARY: No urinary frequency, urgency, hesitancy or dysuria. MUSCULOSKELETAL: No joint or muscle pain, no back pain, no recent trauma. DERMATOLOGIC: No rash, no itching, no lesions. ENDOCRINE: No polyuria, polydipsia, no heat or cold intolerance. No recent change in weight. HEMATOLOGICAL: No anemia or easy bruising or bleeding. NEUROLOGIC: No headache, seizures, numbness, tingling or weakness. PSYCHIATRIC: No depression, no loss of interest in normal activity or change in sleep pattern.     Exam: chaperone present  BP 130/70 mmHg  Ht 5' 4.25" (1.632 m)  Wt 178 lb 9.6 oz (81.012 kg)  BMI 30.42 kg/m2  Body mass index is 30.42 kg/(m^2).  General appearance : Well developed well nourished female. No acute distress HEENT: Eyes: no retinal hemorrhage or exudates,  Neck supple, trachea midline, no carotid bruits, no thyroidmegaly Lungs: Clear to auscultation, no rhonchi or wheezes, or rib retractions  Heart: Regular rate and rhythm, no murmurs or gallops Breast:Examined in sitting and supine position were symmetrical in appearance, no palpable masses or tenderness,  no skin retraction, no nipple inversion, no nipple discharge, no skin discoloration, no axillary or supraclavicular lymphadenopathy Abdomen: no palpable masses or tenderness,  no rebound or guarding Extremities: no edema or skin discoloration or tenderness  Pelvic:  Bartholin, Urethra, Skene Glands: Within normal limits             Vagina: No gross lesions or discharge, atrophic changes  Cervix: No gross lesions or discharge  Uterus  axial, normal size, shape and consistency, non-tender and mobile  Adnexa  Without masses or tenderness  Anus and perineum  normal   Rectovaginal  normal sphincter tone without  palpated masses or tenderness             Hemoccult PCP provides     Assessment/Plan:  81 y.o. female for annual exam who has done well on vaginal estrogen twice a week for vaginal atrophy. No vaginal bleeding reported. She has been applying clobetasol twice a week to external genitalia for her lichen sclerosus. She appears a little raw and I'm going to ask her to back down on the clobetasol to once a week. Pap smear not indicated. PCP doing her blood work. Vaccines up-to-date. I've asked her to ask her PCP to send [place] a copy of her bone density study in January so that we can skin and make it part of her records in our EMR.   Terrance Mass MD, 9:57 AM 11/02/2015

## 2015-11-06 ENCOUNTER — Telehealth: Payer: Self-pay | Admitting: Cardiology

## 2015-11-06 NOTE — Telephone Encounter (Signed)
°*  STAT* If patient is at the pharmacy, call can be transferred to refill team.   1. Which medications need to be refilled? (please list name of each medication and dose if known) Furosemide 20mg   2. Which pharmacy/location (including street and city if local pharmacy) is medication to be sent to?[facility] on [address]   3. Do they need a 30 day or 90 day supply? [place]

## 2015-11-07 MED ORDER — FUROSEMIDE 20 MG PO TABS: 20.0000 mg | ORAL_TABLET | Freq: Every day | ORAL | Status: DC

## 2015-11-07 NOTE — Telephone Encounter (Signed)
Rx(s) sent to pharmacy electronically.  

## 2015-11-21 ENCOUNTER — Other Ambulatory Visit: Payer: Self-pay | Admitting: *Deleted

## 2015-11-21 MED ORDER — AMLODIPINE BESYLATE 5 MG PO TABS: 5.0000 mg | ORAL_TABLET | Freq: Every day | ORAL | Status: DC

## 2015-12-11 DIAGNOSIS — L570 Actinic keratosis: Secondary | ICD-10-CM | POA: Diagnosis not present

## 2015-12-11 DIAGNOSIS — Z85828 Personal history of other malignant neoplasm of skin: Secondary | ICD-10-CM | POA: Diagnosis not present

## 2015-12-13 DIAGNOSIS — M859 Disorder of bone density and structure, unspecified: Secondary | ICD-10-CM | POA: Diagnosis not present

## 2015-12-19 ENCOUNTER — Telehealth: Payer: Self-pay | Admitting: Cardiology

## 2015-12-19 NOTE — Telephone Encounter (Signed)
Returned call to patient.She stated for the past 2 weeks she has been sob,palpitations.Stated hard to walk down hall without being sob.Appointment scheduled with Almyra Deforest PA 12/20/15 at 10:30 am at [facility] office.

## 2015-12-19 NOTE — Telephone Encounter (Signed)
Pt is having palpatations and shortness of breath.

## 2015-12-20 ENCOUNTER — Ambulatory Visit (INDEPENDENT_AMBULATORY_CARE_PROVIDER_SITE_OTHER): Payer: Medicare Other | Admitting: Physician Assistant

## 2015-12-20 ENCOUNTER — Encounter: Payer: Self-pay | Admitting: Physician Assistant

## 2015-12-20 ENCOUNTER — Other Ambulatory Visit: Payer: Self-pay

## 2015-12-20 VITALS — BP 154/62 | HR 85 | Ht 64.25 in | Wt 176.2 lb

## 2015-12-20 DIAGNOSIS — I2510 Atherosclerotic heart disease of native coronary artery without angina pectoris: Secondary | ICD-10-CM | POA: Diagnosis not present

## 2015-12-20 DIAGNOSIS — I10 Essential (primary) hypertension: Secondary | ICD-10-CM | POA: Diagnosis not present

## 2015-12-20 DIAGNOSIS — I5032 Chronic diastolic (congestive) heart failure: Secondary | ICD-10-CM

## 2015-12-20 DIAGNOSIS — I739 Peripheral vascular disease, unspecified: Secondary | ICD-10-CM | POA: Diagnosis not present

## 2015-12-20 DIAGNOSIS — R0609 Other forms of dyspnea: Secondary | ICD-10-CM

## 2015-12-20 DIAGNOSIS — R002 Palpitations: Secondary | ICD-10-CM

## 2015-12-20 MED ORDER — METOPROLOL TARTRATE 50 MG PO TABS: 75.0000 mg | ORAL_TABLET | Freq: Two times a day (BID) | ORAL | Status: DC

## 2015-12-20 NOTE — Patient Instructions (Addendum)
Medication Instructions:  Your physician recommends that you continue on your current medications as directed. Please refer to the Current Medication list given to you today.   Labwork: None ordered  Testing/Procedures: Your physician has recommended that you wear a holter monitor. Holter monitors are medical devices that record the heart's electrical activity. Doctors most often use these monitors to diagnose arrhythmias. Arrhythmias are problems with the speed or rhythm of the heartbeat. The monitor is a small, portable device. You can wear one while you do your normal daily activities. This is usually used to diagnose what is causing palpitations/syncope (passing out).   Your physician has requested that you have an echocardiogram. Echocardiography is a painless test that uses sound waves to create images of your heart. It provides your doctor with information about the size and shape of your heart and how well your heart's chambers and valves are working. This procedure takes approximately one hour. There are no restrictions for this procedure.   Follow-Up: Your physician recommends that you schedule a follow-up appointment in: 2 WEEKS WITH DR. Martinique OR HAO MENG, PA-C   Any Other Special Instructions Will Be Listed Below (If Applicable).     If you need a refill on your cardiac medications before your next appointment, please call your pharmacy. Holter Monitoring A Holter monitor is a small device that is used to detect abnormal heart rhythms. It clips to your clothing and is connected by wires to flat, sticky disks (electrodes) that attach to your chest. It is worn continuously for 24-48 hours. HOME CARE INSTRUCTIONS  Wear your Holter monitor at all times, even while exercising and sleeping, for as long as directed by your health care provider.  Make sure that the Holter monitor is safely clipped to your clothing or close to your body as recommended by your health care  provider.  Do not get the monitor or wires wet.  Do not put body lotion or moisturizer on your chest.  Keep your skin clean.  Keep a diary of your daily activities, such as walking and doing chores. If you feel that your heartbeat is abnormal or that your heart is fluttering or skipping a beat:  Record what you are doing when it happens.  Record what time of day the symptoms occur.  Return your Holter monitor as directed by your health care provider.  Keep all follow-up visits as directed by your health care provider. This is important. SEEK IMMEDIATE MEDICAL CARE IF:  You feel lightheaded or you faint.  You have trouble breathing.  You feel pain in your chest, upper arm, or jaw.  You feel sick to your stomach and your skin is pale, cool, or damp.  You heartbeat feels unusual or abnormal.   This information is not intended to replace advice given to you by your health care provider. Make sure you discuss any questions you have with your health care provider.   Document Released: 08/02/2004 Document Revised: 11/25/2014 Document Reviewed: 06/13/2014 Elsevier Interactive Patient Education 2016 [employer].   Echocardiogram An echocardiogram, or echocardiography, uses sound waves (ultrasound) to produce an image of your heart. The echocardiogram is simple, painless, obtained within a short period of time, and offers valuable information to your health care provider. The images from an echocardiogram can provide information such as:  Evidence of coronary artery disease (CAD).  Heart size.  Heart muscle function.  Heart valve function.  Aneurysm detection.  Evidence of a past heart attack.  Fluid buildup around  the heart.  Heart muscle thickening.  Assess heart valve function. LET [facility] CARE PROVIDER KNOW ABOUT:  Any allergies you have.  All medicines you are taking, including vitamins, herbs, eye drops, creams, and over-the-counter medicines.  Previous  problems you or members of your family have had with the use of anesthetics.  Any blood disorders you have.  Previous surgeries you have had.  Medical conditions you have.  Possibility of pregnancy, if this applies. BEFORE THE PROCEDURE  No special preparation is needed. Eat and drink normally.  PROCEDURE   In order to produce an image of your heart, gel will be applied to your chest and a wand-like tool (transducer) will be moved over your chest. The gel will help transmit the sound waves from the transducer. The sound waves will harmlessly bounce off your heart to allow the heart images to be captured in real-time motion. These images will then be recorded.  You may need an IV to receive a medicine that improves the quality of the pictures. AFTER THE PROCEDURE You may return to your normal schedule including diet, activities, and medicines, unless your health care provider tells you otherwise.   This information is not intended to replace advice given to you by your health care provider. Make sure you discuss any questions you have with your health care provider.   Document Released: 11/01/2000 Document Revised: 11/25/2014 Document Reviewed: 07/12/2013 Elsevier Interactive Patient Education [employer].

## 2015-12-20 NOTE — Telephone Encounter (Signed)
Rx(s) sent to pharmacy electronically.  

## 2015-12-20 NOTE — Progress Notes (Signed)
Cardiology Office Note   Date:  12/20/2015   ID:  [name], DOB 11-07-1929, MRN [id number]  PCP:  Jerlyn Ly, MD  Cardiologist:  Dr. Martinique    Chief Complaint  Patient presents with  . Follow-up    seen for Dr. Martinique  . Shortness of Breath  . Palpitations      History of Present Illness: [name] is a 82 y.o. female who presents for followup evaluation of SOB and palpitation. She has PMH of HTN, HLD, PAD, hyperthyroidism, 1st degree AV block, CAD s/p angioplasty to mid RCA in 1995 (quit smoking at same year), carotid artery disease s/p R CEA 03/09/2001 and chronic diastolic HF. 2D echo obtained on 10/27/2014 showed EF 55-60%, grade 2 DD, no RWMA. Her last myoview in Dec 2015 showed no significant ischemia, EF 76%. Her last ABI was on 09/27/2015 which showed R 0.48, L 0.61. Her carotid U/S on the same day showed L ICA < 40% plaque, R ICA no significant plaque. She was last seen by vascular surgery in 09/2015 at which time she was stable from symptom perspective, she is due for repeat ABI and carotid U/S in Nov 2017. For the past 2 weeks, she has been experiencing SOB  with ambulation and palpitation.  She presents today for evaluation of DOE and palpitation. She says the DOE and palpitation are two separate symptoms and occuring at different time. Palpitation does not seems to be related to exertion and has been occuring on a daily basis. She says her palpitation improve when she cough or bear down. She has been compliant with her medications. She has her LE symptom is surprisingly worse on L side despite recent ABI showing worse ABI on R side.      Past Medical History  Diagnosis Date  . Hypertension     well controlled  . Hyperlipidemia   . PAD (peripheral artery disease) (HCC)     with stable claudication  . Hyperthyroidism   . Osteopenia   . History of obesity   . Basal cell cancer     Nose  . B12 deficiency   . Cataract   . First degree atrioventricular  block   . Colon polyps   . Coronary artery disease     status post angioplasty of the mid RCA in 1995  . Carotid artery occlusion   . Chronic diastolic CHF (congestive heart failure) ([facility])   . Lichen sclerosus et atrophicus of the vulva     Past Surgical History  Procedure Laterality Date  . Endarterectomy      right carotid endarterectomy  . Cholecystectomy  1999  . Repair tendons foot    . Meniscus repair Left 10/29/2012  . Carotid endarterectomy Right 2002  . Knee arthroscopy Left      Current Outpatient Prescriptions  Medication Sig Dispense Refill  . amLODipine (NORVASC) 5 MG tablet Take 1 tablet (5 mg total) by mouth daily. 30 tablet 8  . aspirin 81 MG tablet Take 81 mg by mouth daily.      . cholecalciferol (VITAMIN D) 1000 UNITS tablet Take 1,000 Units by mouth daily.      . clobetasol cream (TEMOVATE) 0.05 % Apply externally twice a week 30 g 5  . Coenzyme Q10 (COQ10) 50 MG CAPS Take 1 capsule by mouth daily.     Marland Kitchen denosumab (PROLIA) 60 MG/ML SOLN injection Inject 60 mg into the skin every 6 (six) months. Administer in upper arm, thigh,  or abdomen    . FOLIC ACID PO Take 1 tablet by mouth daily.      . furosemide (LASIX) 20 MG tablet Take 1 tablet (20 mg total) by mouth daily. 90 tablet 2  . isosorbide mononitrate (IMDUR) 30 MG 24 hr tablet Take 0.5 tablets (15 mg total) by mouth daily. 90 tablet 3  . levothyroxine (SYNTHROID, LEVOTHROID) 137 MCG tablet Take 137 mcg by mouth daily.      . metoprolol (LOPRESSOR) 50 MG tablet Take 75 mg by mouth 2 (two) times daily. Take 1 1/2 tablet twice daily    . Multiple Vitamins-Minerals (EYE VITAMINS) CAPS Take 1 capsule by mouth.     . nitroGLYCERIN (NITROSTAT) 0.4 MG SL tablet Place 0.4 mg under the tongue every 5 (five) minutes as needed for chest pain (x 3 pills).    . NONFORMULARY OR COMPOUNDED ITEM Apply twice a week 24 each 4  . ramipril (ALTACE) 10 MG capsule Take 1 tablet by mouth Daily.    . rosuvastatin (CRESTOR) 40 MG  tablet Take 40 mg by mouth daily.    . vitamin B-12 (CYANOCOBALAMIN) 50 MCG tablet Take 50 mcg by mouth daily.     No current facility-administered medications for this visit.    Allergies:   Review of patient's allergies indicates no known allergies.    Social History:  The patient  reports that she quit smoking about 22 years ago. Her smoking use included Cigarettes. She has a 40 pack-year smoking history. She has never used smokeless tobacco. She reports that she does not drink alcohol or use illicit drugs.   Family History:  The patient's family history includes Diabetes in her sister; Heart attack (age of onset: 24) in her father; Heart disease in her brother; Hypertension in her brother and father.    ROS:  Please see the history of present illness.   Otherwise, review of systems are positive for palpitation and DOE.   All other systems are reviewed and negative.    PHYSICAL EXAM: VS:  BP 154/62 mmHg  Pulse 85  Ht 5' 4.25" (1.632 m)  Wt 176 lb 3.2 oz (79.924 kg)  BMI 30.01 kg/m2 , BMI Body mass index is 30.01 kg/(m^2). GEN: Well nourished, well developed, in no acute distress HEENT: normal Neck: no JVD, or masses +R carotid bruits Cardiac: RRR; no murmurs, rubs, or gallops,no edema  +occasional skipped beat Respiratory:  clear to auscultation bilaterally, normal work of breathing GI: soft, nontender, nondistended, + BS MS: no deformity or atrophy Skin: warm and dry, no rash Neuro:  Strength and sensation are intact Psych: euthymic mood, full affect   EKG:  EKG is ordered today. The ekg ordered today demonstrates NSR with PVCs, nonspecific ST changes (?related to PVCs).    Recent Labs: No results found for requested labs within last 365 days.    Lipid Panel No results found for: CHOL, TRIG, HDL, CHOLHDL, VLDL, LDLCALC, LDLDIRECT    Wt Readings from Last 3 Encounters:  12/20/15 176 lb 3.2 oz (79.924 kg)  11/02/15 178 lb 9.6 oz (81.012 kg)  09/27/15 175 lb (79.379  kg)      Other studies Reviewed: Additional studies/ records that were reviewed today include:    Recent ABI 09/27/2015  R 0.48, L 0.61  Carotid U/S 09/27/2015  L ICA < 40% plaque, R ICA no significant plaque  Myoview 10/2014 Overall Impression: Low risk stress nuclear study with mild distal anteroapical attenuation artifact. No reversible ischemia.  LV  Wall Motion: NL LV Function; NL Wall Motion; LVEF 76%   Echo 10/2014 LV EF: 55% -  60%  ------------------------------------------------------------------- Indications:   786.05 Dyspnea.  ------------------------------------------------------------------- History:  PMH: Hyperlipidemia. Coronary artery disease. Congestive heart failure. Risk factors: Hypertension.  ------------------------------------------------------------------- Study Conclusions  - Left ventricle: The cavity size was normal. Wall thickness was normal. Systolic function was normal. The estimated ejection fraction was in the range of 55% to 60%. Wall motion was normal; there were no regional wall motion abnormalities. Features are consistent with a pseudonormal left ventricular filling pattern, with concomitant abnormal relaxation and increased filling pressure (grade 2 diastolic dysfunction). - Mitral valve: Calcified annulus. - Left atrium: The atrium was mildly dilated.  Impressions:  - Normal LV function; grade 2 diastolic dysfunction; mild LAE, trace TR.   Review of the above records demonstrates:   Patient with CAD, PAD and carotid artery disease have been having palpitation and DOE.    ASSESSMENT AND PLAN:  1. Palpitation: occuring almost on a daily basis, she says palpitation result when she bear down and cough, it sounds like SVT, she also has frequent PVCs on exam. Will obtain 48 hours holter monitor to assess frequent PVCs vs SVT vs afib. I instructed her to keep a diary on her daily BP and when she has  symptom. Depend on arrhythmia captured and her BP, I may hold amlodipine and increase metoprolol to 100mg  BID. If does capture tachyarrhythmia, will need TSH and free T4  2. DOE: she says she had chest pain prior to her cath in 1995, she denies any recent chest pain, I will hold off on ordering myoview for now. Will repeat echocardiogram to check EF, wall motion and valves, if EF low, will then consider myoview  3. CAD s/p angioplasty to mid RCA in 1995 (quit smoking at same year)   4. PAD, last ABI 09/27/2015 which showed R 0.48, L 0.61  5. carotid artery disease s/p R CEA 03/09/2001   - Carotid U/S 09/27/2015 L ICA < 40% plaque, R ICA no significant plaque  - suprisingly she has carotid bruit on R side, did not appreciate obvious aortic valve murmur  6. chronic diastolic HF: euvolemic on exam, I do not think her DOE is related to HF  7. HTN: BP 154/62 today, but she has not taken her BP med today  8. HLD: on 40mg  Crestor   Current medicines are reviewed at length with the patient today.  The patient does not have concerns regarding medicines.  The following changes have been made:  no change  Labs/ tests ordered today include:   Orders Placed This Encounter  Procedures  . Holter monitor - 48 hour  . EKG 12-Lead  . Echocardiogram     Disposition:   FU with me or any cardiology APP in 2 weeks  Signed, Almyra Deforest, [place]  12/20/2015 11:13 AM    [address], [place], [place]  [address] Phone: [phone]; Fax: [phone]

## 2015-12-29 ENCOUNTER — Ambulatory Visit (INDEPENDENT_AMBULATORY_CARE_PROVIDER_SITE_OTHER): Payer: Medicare Other

## 2015-12-29 ENCOUNTER — Other Ambulatory Visit: Payer: Self-pay

## 2015-12-29 ENCOUNTER — Ambulatory Visit (HOSPITAL_COMMUNITY): Payer: Medicare Other | Attending: Internal Medicine

## 2015-12-29 DIAGNOSIS — E785 Hyperlipidemia, unspecified: Secondary | ICD-10-CM | POA: Diagnosis not present

## 2015-12-29 DIAGNOSIS — E669 Obesity, unspecified: Secondary | ICD-10-CM | POA: Insufficient documentation

## 2015-12-29 DIAGNOSIS — I059 Rheumatic mitral valve disease, unspecified: Secondary | ICD-10-CM | POA: Diagnosis not present

## 2015-12-29 DIAGNOSIS — R0609 Other forms of dyspnea: Secondary | ICD-10-CM | POA: Diagnosis not present

## 2015-12-29 DIAGNOSIS — R002 Palpitations: Secondary | ICD-10-CM | POA: Diagnosis not present

## 2015-12-29 DIAGNOSIS — I10 Essential (primary) hypertension: Secondary | ICD-10-CM | POA: Diagnosis not present

## 2015-12-29 DIAGNOSIS — Z6832 Body mass index (BMI) 32.0-32.9, adult: Secondary | ICD-10-CM | POA: Diagnosis not present

## 2016-01-02 ENCOUNTER — Telehealth: Payer: Self-pay | Admitting: Physician Assistant

## 2016-01-02 NOTE — Telephone Encounter (Signed)
Returning [place] call,she did not know which one.

## 2016-01-02 NOTE — Telephone Encounter (Signed)
Returned pts call and discussed the ECHO results.  Pt also confirmed her appt with Almyra Deforest, PA-C, 01/03/16.

## 2016-01-03 ENCOUNTER — Telehealth: Payer: Self-pay | Admitting: Cardiology

## 2016-01-03 ENCOUNTER — Ambulatory Visit (INDEPENDENT_AMBULATORY_CARE_PROVIDER_SITE_OTHER): Payer: Medicare Other | Admitting: Physician Assistant

## 2016-01-03 ENCOUNTER — Encounter: Payer: Self-pay | Admitting: Physician Assistant

## 2016-01-03 VITALS — BP 124/60 | HR 72 | Ht 65.0 in | Wt 175.0 lb

## 2016-01-03 DIAGNOSIS — I2510 Atherosclerotic heart disease of native coronary artery without angina pectoris: Secondary | ICD-10-CM | POA: Diagnosis not present

## 2016-01-03 DIAGNOSIS — R002 Palpitations: Secondary | ICD-10-CM | POA: Diagnosis not present

## 2016-01-03 DIAGNOSIS — R0609 Other forms of dyspnea: Secondary | ICD-10-CM

## 2016-01-03 LAB — D-DIMER, QUANTITATIVE: D-Dimer, Quant: 0.3 ug/mL-FEU (ref 0.00–0.48)

## 2016-01-03 LAB — TSH: TSH: 1.12 mIU/L

## 2016-01-03 MED ORDER — METOPROLOL TARTRATE 100 MG PO TABS: 100.0000 mg | ORAL_TABLET | Freq: Two times a day (BID) | ORAL | Status: DC

## 2016-01-03 NOTE — Patient Instructions (Addendum)
Medication Instructions:  Your physician has recommended you make the following change in your medication:  1.  STOP the Amlodipine 2.  INCREASE the Metoprolol to 100 mg taking 1 tablet twice a day  Labwork: TODAY:  DDIMER & TSH  Testing/Procedures: None ordered  Follow-Up: Your physician recommends that you keep your scheduled follow-up appointment as planned WITH DR. Martinique.   Any Other Special Instructions Will Be Listed Below (If Applicable).   If you need a refill on your cardiac medications before your next appointment, please call your pharmacy.

## 2016-01-03 NOTE — Telephone Encounter (Signed)
[name] has STAT labs for the pt.

## 2016-01-03 NOTE — Telephone Encounter (Signed)
Received a call from [place] with [employer] calling to report patient had a 4 beat ventricular run.Dr.[name] was made aware.Advised no changes.

## 2016-01-03 NOTE — Progress Notes (Signed)
Cardiology Office Note   Date:  01/03/2016   ID:  [name], DOB 01-13-1929, MRN [id number]  PCP:  Jerlyn Ly, MD  Cardiologist:  Dr. Martinique  Chief Complaint  Patient presents with  . Follow-up    seen for Dr. Martinique  . Palpitations  . Shortness of Breath    on exertion      History of Present Illness: [name] is a 82 y.o. female who presents for followup evaluation of SOB and palpitation. She has PMH of HTN, HLD, PAD, hyperthyroidism, 1st degree AV block, CAD s/p angioplasty to mid RCA in 1995 (quit smoking at same year), carotid artery disease s/p R CEA 03/09/2001 and chronic diastolic HF. 2D echo obtained on 10/27/2014 showed EF 55-60%, grade 2 DD, no RWMA. Her last myoview in Dec 2015 showed no significant ischemia, EF 76%. Her last ABI was on 09/27/2015 which showed R 0.48, L 0.61. Her carotid U/S on the same day showed L ICA < 40% plaque, R ICA no significant plaque. She was last seen by vascular surgery in 09/2015 at which time she was stable from symptom perspective, she is due for repeat ABI and carotid U/S in Nov 2017. I saw her in the clinic on 12/20/2015, at which time she complains of shortness of breath with exertion and palpitation for the 2 weeks prior to that. She states her symptom has been occurring on a daily basis. I have instructed her to cut back on caffeinated drinks. I have also obtained a 48-hour Holter monitor and echocardiogram. Interestingly, after cutting back on caffeinated drinks, her symptom has not recurred since. Holter monitor showed sinus rhythm with occasional PVCs, the longest run of PVC was 4 beats. Echocardiogram obtained on 12/29/2015 showed EF [id number], grade 2 diastolic dysfunction, PA peak pressure 64 mmHg.  Despite elevated PA peak pressure, she denies any significant calf pain or ever had any diagnosis of blood clots before. She presents today for follow-up evaluation. She says since our last visit, she has not had any recurrent  palpitation symptom after cutting back on caffeinated drinks. However she continued to have dyspnea on exertion which has been ongoing for several weeks. Differential diagnosis for dyspnea on exertion has been discussed with the patient including PE, pulmonary disease, acute heart failure/volume overload, untreated obstructive sleep apnea and in unlikely event even atypical presentation of angina. She denies any prior history of obstructive sleep apnea, she denies ever snores.   Past Medical History  Diagnosis Date  . Hypertension     well controlled  . Hyperlipidemia   . PAD (peripheral artery disease) (HCC)     with stable claudication  . Hyperthyroidism   . Osteopenia   . History of obesity   . Basal cell cancer     Nose  . B12 deficiency   . Cataract   . First degree atrioventricular block   . Colon polyps   . Coronary artery disease     status post angioplasty of the mid RCA in 1995  . Carotid artery occlusion   . Chronic diastolic CHF (congestive heart failure) ([facility])   . Lichen sclerosus et atrophicus of the vulva     Past Surgical History  Procedure Laterality Date  . Endarterectomy      right carotid endarterectomy  . Cholecystectomy  1999  . Repair tendons foot    . Meniscus repair Left 10/29/2012  . Carotid endarterectomy Right 2002  . Knee arthroscopy Left  Current Outpatient Prescriptions  Medication Sig Dispense Refill  . aspirin 81 MG tablet Take 81 mg by mouth daily.      . cholecalciferol (VITAMIN D) 1000 UNITS tablet Take 1,000 Units by mouth daily.      . clobetasol cream (TEMOVATE) 0.05 % Apply externally twice a week 30 g 5  . Coenzyme Q10 (COQ10) 50 MG CAPS Take 1 capsule by mouth daily.     Marland Kitchen FOLIC ACID PO Take 1 tablet by mouth daily.      . furosemide (LASIX) 20 MG tablet Take 1 tablet (20 mg total) by mouth daily. 90 tablet 2  . isosorbide mononitrate (IMDUR) 30 MG 24 hr tablet Take 0.5 tablets (15 mg total) by mouth daily. 90 tablet 3  .  levothyroxine (SYNTHROID, LEVOTHROID) 137 MCG tablet Take 137 mcg by mouth daily.      . Multiple Vitamins-Minerals (EYE VITAMINS) CAPS Take 1 capsule by mouth.     . nitroGLYCERIN (NITROSTAT) 0.4 MG SL tablet Place 0.4 mg under the tongue every 5 (five) minutes as needed for chest pain (x 3 pills).    . NONFORMULARY OR COMPOUNDED ITEM Apply twice a week 24 each 4  . ramipril (ALTACE) 10 MG capsule Take 1 tablet by mouth Daily.    . rosuvastatin (CRESTOR) 40 MG tablet Take 40 mg by mouth daily.    . vitamin B-12 (CYANOCOBALAMIN) 100 MCG tablet Take 100 mcg by mouth daily.    Marland Kitchen denosumab (PROLIA) 60 MG/ML SOLN injection Inject 60 mg into the skin every 6 (six) months. Administer in upper arm, thigh, or abdomen    . metoprolol (LOPRESSOR) 100 MG tablet Take 1 tablet (100 mg total) by mouth 2 (two) times daily. 180 tablet 3   No current facility-administered medications for this visit.    Allergies:   Review of patient's allergies indicates no known allergies.    Social History:  The patient  reports that she quit smoking about 22 years ago. Her smoking use included Cigarettes. She has a 40 pack-year smoking history. She has never used smokeless tobacco. She reports that she does not drink alcohol or use illicit drugs.   Family History:  The patient's family history includes Diabetes in her sister; Heart attack (age of onset: 63) in her father; Heart disease in her brother; Hypertension in her brother and father.    ROS:  Please see the history of present illness.   Otherwise, review of systems are positive for DOE, palpitation resolved.   All other systems are reviewed and negative.    PHYSICAL EXAM: VS:  BP 124/60 mmHg  Pulse 72  Ht 5\' 5"  (1.651 m)  Wt 175 lb (79.379 kg)  BMI 29.12 kg/m2 , BMI Body mass index is 29.12 kg/(m^2). GEN: Well nourished, well developed, in no acute distress HEENT: normal Neck: no JVD, carotid bruits, or masses Cardiac: RRR; no murmurs, rubs, or gallops,no  edema  Respiratory:  clear to auscultation bilaterally, normal work of breathing GI: soft, nontender, nondistended, + BS MS: no deformity or atrophy Skin: warm and dry, no rash Neuro:  Strength and sensation are intact Psych: euthymic mood, full affect   EKG:  EKG is not ordered today.   Recent Labs: 01/03/2016: TSH 1.12    Lipid Panel No results found for: CHOL, TRIG, HDL, CHOLHDL, VLDL, LDLCALC, LDLDIRECT    Wt Readings from Last 3 Encounters:  01/03/16 175 lb (79.379 kg)  12/20/15 176 lb 3.2 oz (79.924 kg)  11/02/15 178  lb 9.6 oz (81.012 kg)      Other studies Reviewed: Additional studies/ records that were reviewed today include:    Echocardiogram 12/29/2015 LV EF: 60% -  65%  ------------------------------------------------------------------- Indications:   Palpitations (R00.2). Dyspnea (R06.09).  ------------------------------------------------------------------- History:  PMH: PAD. Acquired from the patient and from the patient&'s chart. Palpitations, exertional dyspnea, and bilateral lower extremity edema. 1degrees AV block. Coronary artery disease. Congestive heart failure, with an ejection fraction of 60%by echocardiography. The dysfunction is primarily diastolic. Cerebrovascular disease. Risk factors: Hypertension. Obese. Dyslipidemia.  ------------------------------------------------------------------- Study Conclusions  - Left ventricle: The cavity size was normal. Wall thickness was normal. Systolic function was normal. The estimated ejection fraction was in the range of 60% to 65%. Features are consistent with a pseudonormal left ventricular filling pattern, with concomitant abnormal relaxation and increased filling pressure (grade 2 diastolic dysfunction). Doppler parameters are consistent with high ventricular filling pressure. - Mitral valve: Calcified annulus. Mildly thickened leaflets . Valve area by continuity  equation (using LVOT flow): 1.83 cm^2. - Pulmonary arteries: PA peak pressure: 64 mm Hg (S).  48 hour holter monitor 12/2015 NSR, occasional PVCs, longest run 4 beats  Review of the above records demonstrates:   Patient with h/o CAD presented for evaluation of palpitation and DOE, 48 hour holter monitor obtained, unfortunately by which time she is no longer symptomatic. Holter monitor showed PVCs. Echo showed normal EF, however elevated PA peak pressure.   ASSESSMENT AND PLAN:  1. Palpitation: Palpitation has resolved since our last visit after cutting back on caffeinated drinks. I will obtain TSH today.  - I will increase metoprolol to 100 mg twice a day from 75 mg twice a day. I will also discontinue amlodipine at this time.  2. DOE: she says she had chest pain prior to her cath in 1995, she denies any recent chest pain, given lack of chest discomfort/anginal symptom and normal EF on echo, I will hold off on obtaining Myoview. Unfortunately patient continued to have dyspnea on exertion, echocardiogram does show elevated PA peak pressure in the 60s. I'm not entirely sure what is causing the elevated pulmonary arterial pressure, she denies any history of DVT/PE, although we will obtain an d-dimer just to rule out. She is not fluid overloaded on physical exam. She denies history of obstructive sleep apnea nor does she is snores. She has no prior history of pulmonary disease, although if no other explanation is found, we will need to consider a pulmonary function test +/- sleep study.  3. CAD s/p angioplasty to mid RCA in 1995 (quit smoking at same year): no obvious angina  4. PAD, last ABI 09/27/2015 which showed R 0.48, L 0.61  5. carotid artery disease s/p R CEA 03/09/2001  - Carotid U/S 09/27/2015 L ICA < 40% plaque, R ICA no significant plaque - suprisingly she has carotid bruit on R side, did not appreciate obvious aortic valve murmur  6. chronic  diastolic HF: euvolemic on exam, I do not think her DOE is related to HF  7. HTN: Blood pressure is better today compared to last visit  8. HLD: on 40mg  Crestor   Current medicines are reviewed at length with the patient today.  The patient does not have concerns regarding medicines.  The following changes have been made:  Stop amlodipine, increase metoprolol to 100mg  BID  Labs/ tests ordered today include:   Orders Placed This Encounter  Procedures  . D-Dimer, Quantitative  . TSH     Disposition:  FU with Dr. Martinique in Mar 2017 as previously arranged.  Hilbert Corrigan, [place]  01/03/2016 1:21 PM    [facility] [address], [place], [place]  [address] Phone: [phone]; Fax: [phone]

## 2016-01-03 NOTE — Telephone Encounter (Signed)
Stat labs called from [facility].  TSH 1.12 D-Dimer 0.30  RBV. Note results available in EPIC.  Note forwarded to ordering provider and primary cardiologist.

## 2016-01-04 NOTE — Telephone Encounter (Signed)
D dimer is normal. This rules against PE.  [name] Martinique MD, [facility]

## 2016-01-08 ENCOUNTER — Other Ambulatory Visit: Payer: Self-pay | Admitting: Cardiology

## 2016-01-08 NOTE — Telephone Encounter (Signed)
Rx(s) sent to pharmacy electronically.  

## 2016-01-15 ENCOUNTER — Other Ambulatory Visit: Payer: Self-pay

## 2016-01-15 MED ORDER — NONFORMULARY OR COMPOUNDED ITEM: Status: DC

## 2016-01-17 NOTE — Telephone Encounter (Signed)
Pharmacy never received Rx it was set on print, Rx called into [facility]

## 2016-01-18 ENCOUNTER — Other Ambulatory Visit (HOSPITAL_COMMUNITY): Payer: Self-pay | Admitting: *Deleted

## 2016-01-19 ENCOUNTER — Encounter (HOSPITAL_COMMUNITY)
Admission: RE | Admit: 2016-01-19 | Discharge: 2016-01-19 | Disposition: A | Discharge status: Home / Self Care | Payer: Medicare Other | Source: Ambulatory Visit | Attending: Internal Medicine | Admitting: Internal Medicine

## 2016-01-19 DIAGNOSIS — M810 Age-related osteoporosis without current pathological fracture: Secondary | ICD-10-CM | POA: Insufficient documentation

## 2016-01-19 MED ORDER — DENOSUMAB 60 MG/ML ~~LOC~~ SOLN
60.0000 mg | Freq: Once | SUBCUTANEOUS | Status: AC
  Administered 2016-01-19: 60 mg via SUBCUTANEOUS
  Filled 2016-01-19: qty 1

## 2016-01-23 ENCOUNTER — Ambulatory Visit (INDEPENDENT_AMBULATORY_CARE_PROVIDER_SITE_OTHER): Payer: Medicare Other | Admitting: Cardiology

## 2016-01-23 ENCOUNTER — Encounter: Payer: Self-pay | Admitting: Cardiology

## 2016-01-23 VITALS — BP 130/58 | HR 66 | Ht 65.0 in | Wt 173.1 lb

## 2016-01-23 DIAGNOSIS — I10 Essential (primary) hypertension: Secondary | ICD-10-CM | POA: Diagnosis not present

## 2016-01-23 DIAGNOSIS — I5032 Chronic diastolic (congestive) heart failure: Secondary | ICD-10-CM

## 2016-01-23 DIAGNOSIS — E785 Hyperlipidemia, unspecified: Secondary | ICD-10-CM

## 2016-01-23 DIAGNOSIS — I739 Peripheral vascular disease, unspecified: Secondary | ICD-10-CM

## 2016-01-23 DIAGNOSIS — I2510 Atherosclerotic heart disease of native coronary artery without angina pectoris: Secondary | ICD-10-CM | POA: Diagnosis not present

## 2016-01-23 NOTE — Patient Instructions (Signed)
We will schedule you for pulmonary function tests  Continue your current therapy  I will see you in 6 months.

## 2016-01-24 NOTE — Progress Notes (Signed)
[name] Date of Birth: 1929/09/20   History of Present Illness: [name] is seen today for followup of diastolic CHF and CAD. She has a history of coronary disease and had remote angioplasty of the mid right coronary in 1995.  Last carotid dopplers in Nov. 2016 showed 40-59% stenosis on the left. The right ICA is clear. She has a history of PAD. In October 2015 she developed increased SOB and edema. Echo showed normal systolic function with grade 2 diastolic dysfunction. Myoview study was normal. She was started on lasix daily with resolution of her symptoms and edema. She was seen more recently in February by Almyra Deforest PA with symptoms of SOB and palpitations. Echo showed normal EF [id number] with diastolic dysfunction and moderate pulmonary HTN. Holter monitor showed isolated PACs and PVCs with a 4 beat run of idioventricular rhythm. Metoprolol dose was increased. D- dimer was normal.  On follow up today she is doing well from a cardiac standpoint. She denies  CP, or edema. Palpitations are better. She still experiences DOE without cough. She is exercising regularly and is very active with volunteer work. She does have claudication when she walks longer distance R>L. No TIA or CVA symptoms. No edema.   Current Outpatient Prescriptions on File Prior to Visit  Medication Sig Dispense Refill  . aspirin 81 MG tablet Take 81 mg by mouth daily.      . cholecalciferol (VITAMIN D) 1000 UNITS tablet Take 1,000 Units by mouth daily.      . clobetasol cream (TEMOVATE) 0.05 % Apply externally twice a week 30 g 5  . Coenzyme Q10 (COQ10) 50 MG CAPS Take 1 capsule by mouth daily.     Marland Kitchen denosumab (PROLIA) 60 MG/ML SOLN injection Inject 60 mg into the skin every 6 (six) months. Administer in upper arm, thigh, or abdomen    . FOLIC ACID PO Take 1 tablet by mouth daily.      . furosemide (LASIX) 20 MG tablet Take 1 tablet (20 mg total) by mouth daily. 90 tablet 2  . isosorbide mononitrate (IMDUR) 30 MG 24 hr tablet  TAKE 1/2 TABLET BY MOUTH EVERY DAY 90 tablet 2  . levothyroxine (SYNTHROID, LEVOTHROID) 137 MCG tablet Take 137 mcg by mouth daily.      . metoprolol (LOPRESSOR) 100 MG tablet Take 1 tablet (100 mg total) by mouth 2 (two) times daily. 180 tablet 3  . Multiple Vitamins-Minerals (EYE VITAMINS) CAPS Take 1 capsule by mouth.     . nitroGLYCERIN (NITROSTAT) 0.4 MG SL tablet Place 0.4 mg under the tongue every 5 (five) minutes as needed for chest pain (x 3 pills).    . NONFORMULARY OR COMPOUNDED ITEM Estradiol 0000000 mg [id number] applicators s: insert vaginally twice weekly. 24 each 4  . ramipril (ALTACE) 10 MG capsule Take 1 tablet by mouth Daily.    . rosuvastatin (CRESTOR) 40 MG tablet Take 40 mg by mouth daily.    . vitamin B-12 (CYANOCOBALAMIN) 100 MCG tablet Take 100 mcg by mouth daily.     No current facility-administered medications on file prior to visit.    No Known Allergies  Past Medical History  Diagnosis Date  . Hypertension     well controlled  . Hyperlipidemia   . PAD (peripheral artery disease) (HCC)     with stable claudication  . Hyperthyroidism   . Osteopenia   . History of obesity   . Basal cell cancer     Nose  . B12  deficiency   . Cataract   . First degree atrioventricular block   . Colon polyps   . Coronary artery disease     status post angioplasty of the mid RCA in 1995  . Carotid artery occlusion   . Chronic diastolic CHF (congestive heart failure) ([facility])   . Lichen sclerosus et atrophicus of the vulva     Past Surgical History  Procedure Laterality Date  . Endarterectomy      right carotid endarterectomy  . Cholecystectomy  1999  . Repair tendons foot    . Meniscus repair Left 10/29/2012  . Carotid endarterectomy Right 2002  . Knee arthroscopy Left     History  Smoking status  . Former Smoker -- 1.00 packs/day for 40 years  . Types: Cigarettes  . Quit date: 11/18/1993  Smokeless tobacco  . Never Used    History  Alcohol Use No    Family  History  Problem Relation Age of Onset  . Heart attack Father 35  . Hypertension Father   . Diabetes Sister   . Heart disease Brother     underwent coronary bypass grafting at age 39  . Hypertension Brother     Review of Systems: The review of systems is  as noted above. All other systems were reviewed and are negative.  Physical Exam: BP 130/58 mmHg  Pulse 66  Ht 5\' 5"  (1.651 m)  Wt 78.518 kg (173 lb 1.6 oz)  BMI 28.81 kg/m2 She is an overweight white female in no acute distress. HEENT is normal.  Neck is without JVD, adenopathy, thyromegaly. There is a right carotid bruit. She has an old CEA scar. Lungs are clear. Cardiac exam reveals a regular rate and rhythm without a gallop. There is a soft systolic ejection murmur the right upper sternal border. Abdomen is soft and nontender. She has no lower extremity edema. Pedal pulses are diminished. Feet are pink and warm. Skin is warm and dry. She is alert oriented x3. Cranial nerves II through XII are intact.  LABORATORY DATA: Holter monitor as noted above.   Echo: 12/29/15:Study Conclusions  - Left ventricle: The cavity size was normal. Wall thickness was  normal. Systolic function was normal. The estimated ejection  fraction was in the range of 60% to 65%. Features are consistent  with a pseudonormal left ventricular filling pattern, with  concomitant abnormal relaxation and increased filling pressure  (grade 2 diastolic dysfunction). Doppler parameters are  consistent with high ventricular filling pressure. - Mitral valve: Calcified annulus. Mildly thickened leaflets .  Valve area by continuity equation (using LVOT flow): 1.83 cm^2. - Pulmonary arteries: PA peak pressure: 64 mm Hg (S).    Assessment / Plan: 1. Chronic diastolic CHF. She appears eubolemic on diuretic therapy. He dyspnea seems out of proportion to CHF.   Will continue current therapy. Sodium restriction.  2. Coronary disease with remote angioplasty of  the right coronary in 1995. Myoview study in Dec. 2015 showed no significant ischemia and ejection fraction of 76%. Continue medical management with aspirin, metoprolol, and ACE inhibitor.  3.  PAD with some increased claudication. ABIs in November 2016 improved to .48 on right and .61 on left. Scheduled for follow up with [facility] in November. If symptoms progressing I encouraged her to see Dr. Scot Dock sooner.   4. Carotid arterial disease status post right carotid endarterectomy. Dopplers in November 2016 stable.   5. Hypertension, controlled.  6. Hyperlipidemia well controlled on Crestor, niacin, and fish oil.  7. Dyspnea. She has a prior history of heavy tobacco abuse. Will arrange PFTs. May need to consider pulmonary evaluation. ? Sleep study.  I will follow up in 6 months.

## 2016-01-25 ENCOUNTER — Telehealth: Payer: Self-pay | Admitting: Cardiology

## 2016-01-25 NOTE — Telephone Encounter (Signed)
Returned call to patient.[employer] parking form was mailed to you 01/23/16.

## 2016-01-25 NOTE — Telephone Encounter (Signed)
Pt would like to come by today and pick up the forms for the handicap parking.

## 2016-01-31 ENCOUNTER — Ambulatory Visit (HOSPITAL_COMMUNITY)
Admission: RE | Admit: 2016-01-31 | Discharge: 2016-01-31 | Disposition: A | Discharge status: Home / Self Care | Payer: Medicare Other | Source: Ambulatory Visit | Attending: Cardiology | Admitting: Cardiology

## 2016-01-31 DIAGNOSIS — I5032 Chronic diastolic (congestive) heart failure: Secondary | ICD-10-CM | POA: Diagnosis present

## 2016-01-31 DIAGNOSIS — I739 Peripheral vascular disease, unspecified: Secondary | ICD-10-CM | POA: Diagnosis not present

## 2016-01-31 DIAGNOSIS — E785 Hyperlipidemia, unspecified: Secondary | ICD-10-CM | POA: Diagnosis not present

## 2016-01-31 DIAGNOSIS — I2510 Atherosclerotic heart disease of native coronary artery without angina pectoris: Secondary | ICD-10-CM | POA: Diagnosis not present

## 2016-01-31 DIAGNOSIS — I10 Essential (primary) hypertension: Secondary | ICD-10-CM

## 2016-01-31 DIAGNOSIS — I110 Hypertensive heart disease with heart failure: Secondary | ICD-10-CM | POA: Diagnosis not present

## 2016-01-31 LAB — PULMONARY FUNCTION TEST
DL/VA % PRED: 33 %
DL/VA: 1.6 ml/min/mmHg/L
DLCO unc % pred: 22 %
DLCO unc: 5.4 ml/min/mmHg
FEF 25-75 POST: 0.55 L/s
FEF 25-75 Pre: 0.49 L/sec
FEF2575-%Change-Post: 11 %
FEF2575-%Pred-Post: 47 %
FEF2575-%Pred-Pre: 42 %
FEV1-%CHANGE-POST: 4 %
FEV1-%PRED-PRE: 64 %
FEV1-%Pred-Post: 67 %
FEV1-PRE: 1.15 L
FEV1-Post: 1.2 L
FEV1FVC-%Change-Post: 7 %
FEV1FVC-%PRED-PRE: 80 %
FEV6-%Change-Post: -1 %
FEV6-%PRED-PRE: 84 %
FEV6-%Pred-Post: 83 %
FEV6-POST: 1.88 L
FEV6-Pre: 1.91 L
FEV6FVC-%Change-Post: 1 %
FEV6FVC-%PRED-POST: 105 %
FEV6FVC-%Pred-Pre: 103 %
FVC-%CHANGE-POST: -2 %
FVC-%PRED-POST: 78 %
FVC-%PRED-PRE: 81 %
FVC-POST: 1.9 L
FVC-Pre: 1.95 L
POST FEV6/FVC RATIO: 99 %
PRE FEV6/FVC RATIO: 98 %
Post FEV1/FVC ratio: 63 %
Pre FEV1/FVC ratio: 59 %
RV % pred: 74 %
RV: 1.84 L
TLC % PRED: 76 %
TLC: 3.85 L

## 2016-01-31 MED ORDER — ALBUTEROL SULFATE (2.5 MG/3ML) 0.083% IN NEBU
2.5000 mg | INHALATION_SOLUTION | Freq: Once | RESPIRATORY_TRACT | Status: AC
  Administered 2016-01-31: 2.5 mg via RESPIRATORY_TRACT

## 2016-02-01 ENCOUNTER — Other Ambulatory Visit: Payer: Self-pay

## 2016-02-01 DIAGNOSIS — R942 Abnormal results of pulmonary function studies: Secondary | ICD-10-CM

## 2016-02-02 ENCOUNTER — Telehealth: Payer: Self-pay | Admitting: Cardiology

## 2016-02-02 NOTE — Telephone Encounter (Signed)
Left voice mail for patient on home phone and cell phone voice mails regarding Dr. Elsworth Soho appointment on 02-05-16 att 3:45, arriving at their location at 3:30.  Left address, date and time of appointment.

## 2016-02-05 ENCOUNTER — Ambulatory Visit (INDEPENDENT_AMBULATORY_CARE_PROVIDER_SITE_OTHER)
Admission: RE | Admit: 2016-02-05 | Discharge: 2016-02-05 | Disposition: A | Discharge status: Home / Self Care | Payer: Medicare Other | Source: Ambulatory Visit | Attending: Pulmonary Disease | Admitting: Pulmonary Disease

## 2016-02-05 ENCOUNTER — Ambulatory Visit (INDEPENDENT_AMBULATORY_CARE_PROVIDER_SITE_OTHER): Payer: Medicare Other | Admitting: Pulmonary Disease

## 2016-02-05 ENCOUNTER — Encounter: Payer: Self-pay | Admitting: Pulmonary Disease

## 2016-02-05 VITALS — BP 110/76 | HR 63 | Ht 64.0 in | Wt 174.8 lb

## 2016-02-05 DIAGNOSIS — J449 Chronic obstructive pulmonary disease, unspecified: Secondary | ICD-10-CM | POA: Insufficient documentation

## 2016-02-05 DIAGNOSIS — I272 Other secondary pulmonary hypertension: Secondary | ICD-10-CM | POA: Diagnosis not present

## 2016-02-05 DIAGNOSIS — R0602 Shortness of breath: Secondary | ICD-10-CM | POA: Diagnosis not present

## 2016-02-05 DIAGNOSIS — I2510 Atherosclerotic heart disease of native coronary artery without angina pectoris: Secondary | ICD-10-CM

## 2016-02-05 NOTE — Assessment & Plan Note (Addendum)
Your shortness of breath may be due to COPD or pulmonary hypertension  I agree that her dyspnea is out of proportion to her degree of COPD, also of note she quit smoking in 1995. The diffusion capacity on PFTs is decreased out of proportion to degree of airway obstruction as noted by FEV1-this suggests either interstitial lung disease or an affect of pulmonary hypertension. Given interstitial prominence on her chest x-ray we'll proceed with high-resolution CT imaging-I did not hear crackles on exam today  Trial of spiriva 2 puffs daily - call in 2 weeks to report

## 2016-02-05 NOTE — Progress Notes (Signed)
Subjective:    Patient ID: [name], female    DOB: October 23, 1929, 82 y.o.   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 She walks for long distances in this capacity but has lately found that she gets increasingly shortness of breath on that can of exertion. She is unable to climb stairs, is able to perform activities of daily living. She smoked about 40 pyrs  before she quit in 1995 when she had coronary angioplasty. Echo has shown diastolic dysfunction and moderate pulmonary hypertension. It was felt that her dyspnea is out of proportion to these echo findings and hence she was referred for pulmonary evaluation.  She denies wheezing or frequent chest colds. She is maintained on metoprolol and lisinopril and seems to have tolerated this well-she denies chronic cough  Chest x-ray shows increased interstitial prominence compared to her older films from 2009   PFTs 01/2016 -FEV1 1.15 - 64%, no BD response, DLCO 22%  Echo - RVSP 68, gr 2 DD, nml LV fn   Past Medical History  Diagnosis Date  . Hypertension     well controlled  . Hyperlipidemia   . PAD (peripheral artery disease) (HCC)     with stable claudication  . Hyperthyroidism   . Osteopenia   . History of obesity   . Basal cell cancer     Nose  . B12 deficiency   . Cataract   . First degree atrioventricular block   . Colon polyps   . Coronary artery disease     status post angioplasty of the mid RCA in 1995  . Carotid artery occlusion   . Chronic diastolic CHF (congestive heart failure) ([facility])   . Lichen sclerosus et atrophicus of the vulva      Past Surgical History  Procedure Laterality Date  . Endarterectomy      right carotid endarterectomy  . Cholecystectomy  1999  . Repair tendons foot    . Meniscus repair Left 10/29/2012  . Carotid endarterectomy Right  2002  . Knee arthroscopy Left      No Known Allergies   Social History   Social History  . Marital Status: Widowed    Spouse Name: N/A  . Number of Children: 3  . Years of Education: N/A   Occupational History  . nurse     retired   Social History Main Topics  . Smoking status: Former Smoker -- 1.00 packs/day for 40 years    Types: Cigarettes    Quit date: 11/18/1993  . Smokeless tobacco: Never Used  . Alcohol Use: No  . Drug Use: No  . Sexual Activity: Not on file   Other Topics Concern  . Not on file   Social History Narrative    Family History  Problem Relation Age of Onset  . Heart attack Father 49  . Hypertension Father   . Diabetes Sister   . Heart disease Brother     underwent coronary bypass grafting at age 44  . Hypertension Brother      Review of Systems  Constitutional: Negative for fever, chills and unexpected weight change.  HENT: Negative for congestion, dental problem, ear pain, nosebleeds, postnasal drip, rhinorrhea, sinus pressure, sneezing, sore throat, trouble swallowing and voice change.   Eyes: Negative for visual disturbance.  Respiratory: Positive for shortness of breath. Negative  for cough and choking.   Cardiovascular: Negative for chest pain and leg swelling.  Gastrointestinal: Negative for vomiting, abdominal pain and diarrhea.  Genitourinary: Negative for difficulty urinating.  Musculoskeletal: Negative for arthralgias.  Skin: Negative for rash.  Neurological: Negative for tremors, syncope and headaches.  Hematological: Does not bruise/bleed easily.       Objective:   Physical Exam  Gen. Pleasant, well-nourished, in no distress, normal affect ENT - no lesions, no post nasal drip Neck: No JVD, no thyromegaly, no carotid bruits Lungs: no use of accessory muscles, no dullness to percussion, clear without rales or rhonchi  Cardiovascular: Rhythm regular, heart sounds  normal, no murmurs or gallops, no peripheral  edema Abdomen: soft and non-tender, no hepatosplenomegaly, BS normal. Musculoskeletal: No deformities, no cyanosis or clubbing Neuro:  alert, non focal       Assessment & Plan:

## 2016-02-05 NOTE — Patient Instructions (Signed)
Your shortness of breath may be due to COPD or pulmonary hypertension  CXR today Trial of spiriva 2 puffs daily - call in 2 weeks to report

## 2016-02-06 ENCOUNTER — Other Ambulatory Visit: Payer: Self-pay | Admitting: Pulmonary Disease

## 2016-02-06 DIAGNOSIS — I272 Pulmonary hypertension, unspecified: Secondary | ICD-10-CM | POA: Insufficient documentation

## 2016-02-06 DIAGNOSIS — J449 Chronic obstructive pulmonary disease, unspecified: Secondary | ICD-10-CM

## 2016-02-06 DIAGNOSIS — R0602 Shortness of breath: Secondary | ICD-10-CM

## 2016-02-06 NOTE — Assessment & Plan Note (Signed)
Secondary to heart and lung disease Does not need pulmonary vasodilators

## 2016-02-14 ENCOUNTER — Ambulatory Visit (INDEPENDENT_AMBULATORY_CARE_PROVIDER_SITE_OTHER)
Admission: RE | Admit: 2016-02-14 | Discharge: 2016-02-14 | Disposition: A | Discharge status: Home / Self Care | Payer: Medicare Other | Source: Ambulatory Visit | Attending: Pulmonary Disease | Admitting: Pulmonary Disease

## 2016-02-14 DIAGNOSIS — R0602 Shortness of breath: Secondary | ICD-10-CM | POA: Diagnosis not present

## 2016-02-14 DIAGNOSIS — J449 Chronic obstructive pulmonary disease, unspecified: Secondary | ICD-10-CM | POA: Diagnosis not present

## 2016-02-15 ENCOUNTER — Telehealth: Payer: Self-pay | Admitting: Pulmonary Disease

## 2016-02-15 MED ORDER — TIOTROPIUM BROMIDE MONOHYDRATE 1.25 MCG/ACT IN AERS: 2.0000 | INHALATION_SPRAY | Freq: Every day | RESPIRATORY_TRACT | Status: DC

## 2016-02-15 NOTE — Telephone Encounter (Signed)
Last ov with RA on 02/05/16 Patient Instructions     Your shortness of breath may be due to COPD or pulmonary hypertension  CXR today Trial of Spiriva 2 puffs daily - call in 2 weeks to report   Called and spoke with pt. She states the Spiriva respimat 1.25 is working well and would like it called into the pharmacy. I verified pharmacy as [facility] on [address]. She also states that her next appointment was scheduled with CY and she was not sure why. She is requesting the ov be moved back with RA. I scheduled her with RA on 04/24/16 at 10am. She voiced understanding and had no further questions. Rx has been sent to the pharmacy. Nothing further needed.

## 2016-03-04 DIAGNOSIS — R55 Syncope and collapse: Secondary | ICD-10-CM | POA: Diagnosis not present

## 2016-03-04 DIAGNOSIS — I509 Heart failure, unspecified: Secondary | ICD-10-CM | POA: Diagnosis not present

## 2016-03-04 DIAGNOSIS — Z6829 Body mass index (BMI) 29.0-29.9, adult: Secondary | ICD-10-CM | POA: Diagnosis not present

## 2016-03-05 DIAGNOSIS — R718 Other abnormality of red blood cells: Secondary | ICD-10-CM | POA: Diagnosis not present

## 2016-03-05 DIAGNOSIS — D649 Anemia, unspecified: Secondary | ICD-10-CM | POA: Diagnosis not present

## 2016-03-11 DIAGNOSIS — L570 Actinic keratosis: Secondary | ICD-10-CM | POA: Diagnosis not present

## 2016-03-11 DIAGNOSIS — L820 Inflamed seborrheic keratosis: Secondary | ICD-10-CM | POA: Diagnosis not present

## 2016-03-19 DIAGNOSIS — K921 Melena: Secondary | ICD-10-CM | POA: Diagnosis not present

## 2016-04-16 ENCOUNTER — Other Ambulatory Visit: Payer: Self-pay

## 2016-04-16 DIAGNOSIS — Z1231 Encounter for screening mammogram for malignant neoplasm of breast: Secondary | ICD-10-CM

## 2016-04-24 ENCOUNTER — Ambulatory Visit (INDEPENDENT_AMBULATORY_CARE_PROVIDER_SITE_OTHER): Payer: Medicare Other | Admitting: Pulmonary Disease

## 2016-04-24 ENCOUNTER — Encounter: Payer: Self-pay | Admitting: Pulmonary Disease

## 2016-04-24 VITALS — BP 120/74 | HR 79 | Ht 65.75 in | Wt 172.5 lb

## 2016-04-24 DIAGNOSIS — J449 Chronic obstructive pulmonary disease, unspecified: Secondary | ICD-10-CM

## 2016-04-24 DIAGNOSIS — H52203 Unspecified astigmatism, bilateral: Secondary | ICD-10-CM | POA: Diagnosis not present

## 2016-04-24 DIAGNOSIS — I272 Other secondary pulmonary hypertension: Secondary | ICD-10-CM

## 2016-04-24 DIAGNOSIS — I2510 Atherosclerotic heart disease of native coronary artery without angina pectoris: Secondary | ICD-10-CM

## 2016-04-24 DIAGNOSIS — H43813 Vitreous degeneration, bilateral: Secondary | ICD-10-CM | POA: Diagnosis not present

## 2016-04-24 DIAGNOSIS — H04123 Dry eye syndrome of bilateral lacrimal glands: Secondary | ICD-10-CM | POA: Diagnosis not present

## 2016-04-24 DIAGNOSIS — Z961 Presence of intraocular lens: Secondary | ICD-10-CM | POA: Diagnosis not present

## 2016-04-24 NOTE — Assessment & Plan Note (Signed)
Your main problem is pulmonary hypertension like we discussed Monitor your weight on a weekly basis- stay on diuretic

## 2016-04-24 NOTE — Progress Notes (Signed)
   Subjective:    Patient ID: [name], female    DOB: February 05, 1929, 82 y.o.   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    her dyspnea is out of proportion to her degree of COPD, also of note she quit smoking in 1995. The diffusion capacity on PFTs is decreased out of proportion to degree of airway obstruction as noted by FEV1   She smoked about 40 pyrs  before she quit in 1995 when she had coronary angioplasty. Echo has shown diastolic dysfunction and moderate pulmonary hypertension    04/24/2016  Chief Complaint  Patient presents with  . Follow-up    breathing is doing well. only gets out of breath when walking a long distance, doing much better with exertion.     On her last visit, we put her on Spiriva The seems to have helped her breathing and she seems to be able to walk for longer distances. She continues to want to the hospital She denies orthopnea or paroxysmal nocturnal dyspnea, Oxygen level was low at 89% today       Significant tests/ events  Chest x-ray shows increased interstitial prominence compared to her older films from 2009 HRCT 01/2016 >> no evidence of interstitial lung disease, mild bibasal scarring  PFTs 01/2016 -FEV1 1.15 - 64%, no BD response, DLCO 22%  Echo - RVSP 68, gr 2 DD, nml LV fn  Review of Systems neg for any significant sore throat, dysphagia, itching, sneezing, nasal congestion or excess/ purulent secretions, fever, chills, sweats, unintended wt loss, pleuritic or exertional cp, hempoptysis, orthopnea pnd or change in chronic leg swelling. Also denies presyncope, palpitations, heartburn, abdominal pain, nausea, vomiting, diarrhea or change in bowel or urinary habits, dysuria,hematuria, rash, arthralgias, visual complaints, headache, numbness weakness or ataxia.     Objective:   Physical Exam  Gen.  Pleasant, well-nourished, in no distress ENT - no lesions, no post nasal drip Neck: No JVD, no thyromegaly, no carotid bruits Lungs: no use of accessory muscles, no dullness to percussion, clear without rales or rhonchi  Cardiovascular: Rhythm regular, heart sounds  normal, no murmurs or gallops, no peripheral edema Musculoskeletal: No deformities, no cyanosis or clubbing        Assessment & Plan:

## 2016-04-24 NOTE — Assessment & Plan Note (Signed)
Stay on Spiriva daily- you have stage II COPD

## 2016-04-24 NOTE — Patient Instructions (Signed)
Stay on Spiriva daily- you have stage II COPD He remained problem is pulmonary hypertension likely discussed Monitor your weight on a weekly basis- stay on diuretic

## 2016-04-25 ENCOUNTER — Ambulatory Visit
Admission: RE | Admit: 2016-04-25 | Discharge: 2016-04-25 | Disposition: A | Discharge status: Home / Self Care | Payer: Medicare Other | Source: Ambulatory Visit

## 2016-04-25 DIAGNOSIS — Z1231 Encounter for screening mammogram for malignant neoplasm of breast: Secondary | ICD-10-CM | POA: Diagnosis not present

## 2016-05-08 ENCOUNTER — Ambulatory Visit: Payer: Medicare Other | Admitting: Internal Medicine

## 2016-06-10 DIAGNOSIS — L570 Actinic keratosis: Secondary | ICD-10-CM | POA: Diagnosis not present

## 2016-06-10 DIAGNOSIS — S80812A Abrasion, left lower leg, initial encounter: Secondary | ICD-10-CM | POA: Diagnosis not present

## 2016-06-11 DIAGNOSIS — R808 Other proteinuria: Secondary | ICD-10-CM | POA: Diagnosis not present

## 2016-06-11 DIAGNOSIS — E784 Other hyperlipidemia: Secondary | ICD-10-CM | POA: Diagnosis not present

## 2016-06-11 DIAGNOSIS — E538 Deficiency of other specified B group vitamins: Secondary | ICD-10-CM | POA: Diagnosis not present

## 2016-06-11 DIAGNOSIS — E038 Other specified hypothyroidism: Secondary | ICD-10-CM | POA: Diagnosis not present

## 2016-06-11 DIAGNOSIS — M859 Disorder of bone density and structure, unspecified: Secondary | ICD-10-CM | POA: Diagnosis not present

## 2016-06-11 DIAGNOSIS — R7301 Impaired fasting glucose: Secondary | ICD-10-CM | POA: Diagnosis not present

## 2016-06-18 ENCOUNTER — Ambulatory Visit (HOSPITAL_COMMUNITY)
Admission: RE | Admit: 2016-06-18 | Discharge: 2016-06-18 | Disposition: A | Discharge status: Home / Self Care | Payer: Medicare Other | Source: Ambulatory Visit | Attending: Internal Medicine | Admitting: Internal Medicine

## 2016-06-18 ENCOUNTER — Telehealth: Payer: Self-pay | Admitting: Physician Assistant

## 2016-06-18 ENCOUNTER — Other Ambulatory Visit (HOSPITAL_COMMUNITY): Payer: Self-pay | Admitting: Internal Medicine

## 2016-06-18 DIAGNOSIS — R600 Localized edema: Secondary | ICD-10-CM | POA: Diagnosis not present

## 2016-06-18 DIAGNOSIS — I5032 Chronic diastolic (congestive) heart failure: Secondary | ICD-10-CM | POA: Diagnosis not present

## 2016-06-18 DIAGNOSIS — D6859 Other primary thrombophilia: Secondary | ICD-10-CM | POA: Diagnosis not present

## 2016-06-18 DIAGNOSIS — E038 Other specified hypothyroidism: Secondary | ICD-10-CM | POA: Diagnosis not present

## 2016-06-18 DIAGNOSIS — E785 Hyperlipidemia, unspecified: Secondary | ICD-10-CM | POA: Diagnosis not present

## 2016-06-18 DIAGNOSIS — Z6829 Body mass index (BMI) 29.0-29.9, adult: Secondary | ICD-10-CM | POA: Diagnosis not present

## 2016-06-18 DIAGNOSIS — R601 Generalized edema: Secondary | ICD-10-CM | POA: Diagnosis not present

## 2016-06-18 DIAGNOSIS — I110 Hypertensive heart disease with heart failure: Secondary | ICD-10-CM | POA: Insufficient documentation

## 2016-06-18 DIAGNOSIS — R55 Syncope and collapse: Secondary | ICD-10-CM | POA: Diagnosis not present

## 2016-06-18 DIAGNOSIS — E784 Other hyperlipidemia: Secondary | ICD-10-CM | POA: Diagnosis not present

## 2016-06-18 DIAGNOSIS — N182 Chronic kidney disease, stage 2 (mild): Secondary | ICD-10-CM | POA: Diagnosis not present

## 2016-06-18 DIAGNOSIS — I2510 Atherosclerotic heart disease of native coronary artery without angina pectoris: Secondary | ICD-10-CM | POA: Diagnosis not present

## 2016-06-18 DIAGNOSIS — D6489 Other specified anemias: Secondary | ICD-10-CM | POA: Diagnosis not present

## 2016-06-18 DIAGNOSIS — I509 Heart failure, unspecified: Secondary | ICD-10-CM | POA: Diagnosis not present

## 2016-06-18 DIAGNOSIS — Z1389 Encounter for screening for other disorder: Secondary | ICD-10-CM | POA: Diagnosis not present

## 2016-06-18 DIAGNOSIS — Z0000 Encounter for general adult medical examination without abnormal findings: Secondary | ICD-10-CM | POA: Diagnosis not present

## 2016-06-18 NOTE — Telephone Encounter (Signed)
Received records from [facility] for appointment on 07/23/16 with Rosaria Ferries, PA.  Records given to [employer] (medical records) for Rhonda's schedule on 07/23/16. lp

## 2016-06-25 ENCOUNTER — Encounter (HOSPITAL_COMMUNITY): Payer: Medicare Other

## 2016-06-27 ENCOUNTER — Telehealth: Payer: Self-pay | Admitting: *Deleted

## 2016-06-27 ENCOUNTER — Encounter: Payer: Self-pay | Admitting: Oncology

## 2016-06-27 ENCOUNTER — Telehealth: Payer: Self-pay | Admitting: Oncology

## 2016-06-27 NOTE — Telephone Encounter (Signed)
Patient returned my call to schedule appt. Appointment set with Dr. Alen Blew on 8/18 at 11am. Patient aware to arrive 30 minutes early. Demographics verified. Letter to the referring and mailed to the patient.

## 2016-06-27 NOTE — Telephone Encounter (Signed)
New Patient referral/ Hematology Records received from outside office located in media tab

## 2016-07-03 DIAGNOSIS — I10 Essential (primary) hypertension: Secondary | ICD-10-CM | POA: Diagnosis not present

## 2016-07-05 ENCOUNTER — Telehealth: Payer: Self-pay | Admitting: Oncology

## 2016-07-05 ENCOUNTER — Ambulatory Visit (HOSPITAL_BASED_OUTPATIENT_CLINIC_OR_DEPARTMENT_OTHER): Payer: Medicare Other | Admitting: Oncology

## 2016-07-05 ENCOUNTER — Ambulatory Visit (HOSPITAL_BASED_OUTPATIENT_CLINIC_OR_DEPARTMENT_OTHER): Payer: Medicare Other

## 2016-07-05 VITALS — BP 151/82 | HR 76 | Temp 97.8°F | Resp 18 | Ht 65.75 in | Wt 171.8 lb

## 2016-07-05 DIAGNOSIS — D751 Secondary polycythemia: Secondary | ICD-10-CM | POA: Diagnosis not present

## 2016-07-05 DIAGNOSIS — I509 Heart failure, unspecified: Secondary | ICD-10-CM

## 2016-07-05 LAB — COMPREHENSIVE METABOLIC PANEL
ALT: 15 U/L (ref 0–55)
AST: 27 U/L (ref 5–34)
Albumin: 3.3 g/dL — ABNORMAL LOW (ref 3.5–5.0)
Alkaline Phosphatase: 60 U/L (ref 40–150)
Anion Gap: 10 mEq/L (ref 3–11)
BUN: 22.4 mg/dL (ref 7.0–26.0)
CHLORIDE: 105 meq/L (ref 98–109)
CO2: 24 meq/L (ref 22–29)
Calcium: 10 mg/dL (ref 8.4–10.4)
Creatinine: 1 mg/dL (ref 0.6–1.1)
EGFR: 50 mL/min/{1.73_m2} — AB (ref >90)
GLUCOSE: 101 mg/dL (ref 70–140)
POTASSIUM: 4.5 meq/L (ref 3.5–5.1)
SODIUM: 139 meq/L (ref 136–145)
Total Bilirubin: 0.98 mg/dL (ref 0.20–1.20)
Total Protein: 8 g/dL (ref 6.4–8.3)

## 2016-07-05 LAB — CBC WITH DIFFERENTIAL/PLATELET
BASO%: 0.3 % (ref 0.0–2.0)
BASOS ABS: 0 10*3/uL (ref 0.0–0.1)
EOS%: 0.1 % (ref 0.0–7.0)
Eosinophils Absolute: 0 10*3/uL (ref 0.0–0.5)
HCT: 59.3 % — ABNORMAL HIGH (ref 34.8–46.6)
HGB: 19.5 g/dL — ABNORMAL HIGH (ref 11.6–15.9)
LYMPH%: 13.3 % — ABNORMAL LOW (ref 14.0–49.7)
MCH: 26 pg (ref 25.1–34.0)
MCHC: 32.9 g/dL (ref 31.5–36.0)
MCV: 79.2 fL — AB (ref 79.5–101.0)
MONO#: 0.4 10*3/uL (ref 0.1–0.9)
MONO%: 5.8 % (ref 0.0–14.0)
NEUT#: 5.7 10*3/uL (ref 1.5–6.5)
NEUT%: 80.5 % — AB (ref 38.4–76.8)
Platelets: 142 10*3/uL — ABNORMAL LOW (ref 145–400)
RBC: 7.49 10*6/uL — AB (ref 3.70–5.45)
RDW: 17.2 % — AB (ref 11.2–14.5)
WBC: 7.1 10*3/uL (ref 3.9–10.3)
lymph#: 0.9 10*3/uL (ref 0.9–3.3)
nRBC: 0 % (ref 0–0)

## 2016-07-05 NOTE — Progress Notes (Signed)
Reason for Referral: Polycythemia.   HPI: 82 year old woman native of [place] and have been living in this area for many years. She has a history of COPD, coronary disease and hyperlipidemia. Despite her comorbid conditions, she remains fairly independent and continues to volunteer at [facility]. 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 She was noted recently to have elevation in her hemoglobin has been trending well last 4 years. Her most recent CBC obtained by Dr. Joylene Draft in July 2017 showed a hemoglobin of 17.9. Her white cell count was normal at 6.3 with a platelet of 141. Her RDW was slightly high at 18.8. Her differential was otherwise normal. Her vitamin B12 slightly elevated at 1193. Previous CBCs in last 4 years have been within normal range but slightly rising to this point. She has been diagnosed with COPD and have smoked heavily up to about 20 years ago. She does report some dyspnea on exertion but still able to volunteer and exercise including water aerobics. She is on diuretic but she denied any exposure to carbon monoxide or any hormonal supplements. She denied any headaches or blurry vision although she did report some occasional dizziness and presyncopal episodes. She denies any chest pain or angina. She denied any thrombosis or bleeding episodes. She denied any constitutional symptoms or joint stiffness.  She does not report any headaches, seizures or syncope. She does not report any fevers, chills, sweats or weight loss. She does not report any chest pain, palpitation, orthopnea or leg edema. She does not report any cough, wheezing or hemoptysis. She does not report any nausea, vomiting, abdominal pain, although satiety or abdominal distention. She does not report any constipation or diarrhea. She does not report any frequency urgency or hesitancy. She does not report any skeletal complaints. Remaining review of systems unremarkable.   Past Medical History:   Diagnosis Date  . B12 deficiency   . Basal cell cancer    Nose  . Carotid artery occlusion   . Cataract   . Chronic diastolic CHF (congestive heart failure) ([place])   . Colon polyps   . Coronary artery disease    status post angioplasty of the mid RCA in 1995  . First degree atrioventricular block   . History of obesity   . Hyperlipidemia   . Hypertension    well controlled  . Hyperthyroidism   . Lichen sclerosus et atrophicus of the vulva   . Osteopenia   . PAD (peripheral artery disease) ([place])    with stable claudication  :  Past Surgical History:  Procedure Laterality Date  . CAROTID ENDARTERECTOMY Right 2002  . CHOLECYSTECTOMY  1999  . ENDARTERECTOMY     right carotid endarterectomy  . KNEE ARTHROSCOPY Left   . MENISCUS REPAIR Left 10/29/2012  . REPAIR TENDONS FOOT    :   Current Outpatient Prescriptions:  .  aspirin 81 MG tablet, Take 81 mg by mouth daily.  , Disp: , Rfl:  .  Calcium Carbonate-Vit D-Min (CALCIUM 1200 PO), Take 1 tablet by mouth daily., Disp: , Rfl:  .  cholecalciferol (VITAMIN D) 1000 UNITS tablet, Take 1,000 Units by mouth daily.  , Disp: , Rfl:  .  clobetasol cream (TEMOVATE) 0.05 %, Apply externally twice a week, Disp: 30 g, Rfl: 5 .  Coenzyme Q10 (COQ10) 50 MG CAPS, Take 1 capsule by mouth daily. , Disp: , Rfl:  .  denosumab (PROLIA) 60 MG/ML SOLN injection, Inject 60 mg into the skin  every 6 (six) months. Administer in upper arm, thigh, or abdomen, Disp: , Rfl:  .  FOLIC ACID PO, Take 1 tablet by mouth daily.  , Disp: , Rfl:  .  furosemide (LASIX) 20 MG tablet, Take 1 tablet (20 mg total) by mouth daily., Disp: 90 tablet, Rfl: 2 .  isosorbide mononitrate (IMDUR) 30 MG 24 hr tablet, TAKE 1/2 TABLET BY MOUTH EVERY DAY, Disp: 90 tablet, Rfl: 2 .  levothyroxine (SYNTHROID, LEVOTHROID) 137 MCG tablet, Take 137 mcg by mouth daily.  , Disp: , Rfl:  .  metoprolol (LOPRESSOR) 100 MG tablet, Take 1 tablet (100 mg total) by mouth 2 (two) times daily.  (Patient taking differently: Take 50 mg by mouth 2 (two) times daily. ), Disp: 180 tablet, Rfl: 3 .  Multiple Vitamins-Minerals (EYE VITAMINS) CAPS, Take 1 capsule by mouth. , Disp: , Rfl:  .  nitroGLYCERIN (NITROSTAT) 0.4 MG SL tablet, Place 0.4 mg under the tongue every 5 (five) minutes as needed for chest pain (x 3 pills)., Disp: , Rfl:  .  NONFORMULARY OR COMPOUNDED ITEM, Estradiol 0000000 mg [id number] applicators s: insert vaginally twice weekly., Disp: 24 each, Rfl: 4 .  Omega-3 Fatty Acids (FISH OIL) 1000 MG CAPS, Take 1 capsule by mouth 2 (two) times daily., Disp: , Rfl:  .  rosuvastatin (CRESTOR) 40 MG tablet, Take 40 mg by mouth daily., Disp: , Rfl:  .  Tiotropium Bromide Monohydrate (SPIRIVA RESPIMAT) 1.25 MCG/ACT AERS, Inhale 2 puffs into the lungs daily., Disp: 1 Inhaler, Rfl: 4 .  vitamin B-12 (CYANOCOBALAMIN) 100 MCG tablet, Take 100 mcg by mouth daily., Disp: , Rfl: :  No Known Allergies:  Family History  Problem Relation Age of Onset  . Heart attack Father 98  . Hypertension Father   . Diabetes Sister   . Heart disease Brother     underwent coronary bypass grafting at age 1  . Hypertension Brother   :  Social History   Social History  . Marital status: Widowed    Spouse name: N/A  . Number of children: 3  . Years of education: N/A   Occupational History  . nurse     retired   Social History Main Topics  . Smoking status: Former Smoker    Packs/day: 1.00    Years: 40.00    Types: Cigarettes    Quit date: 11/18/1993  . Smokeless tobacco: Never Used  . Alcohol use No  . Drug use: No  . Sexual activity: Not on file   Other Topics Concern  . Not on file   Social History Narrative  . No narrative on file  :  Pertinent items are noted in HPI.  Exam: Blood pressure (!) 151/82, pulse 76, temperature 97.8 F (36.6 C), temperature source Oral, resp. rate 18, height 5' 5.75" (1.67 m), weight 171 lb 12.8 oz (77.9 kg).  ECOG 0  General appearance: alert and  cooperative appeared without distress. Head: Normocephalic, without obvious abnormality scleral were injected. Throat: lips, mucosa, and tongue normal; teeth and gums normal thrush noted. Neck: no adenopathy Back: negative Resp: clear to auscultation bilaterally no wheezes or rhonchi. Chest wall: no tenderness Cardio: regular rate and rhythm, S1, S2 normal, no murmur, click, rub or gallop GI: soft, non-tender; bowel sounds normal; no masses,  no organomegaly Extremities: extremities normal, atraumatic, no cyanosis or edema Pulses: 2+ and symmetric   Recent Labs  07/05/16 1152  WBC 7.1  HGB 19.5*  HCT 59.3*  PLT 142*  Assessment and Plan:   82 year old woman with the following issues:  1. Polycythemia that has been evolving over 4 years and documented in July 2017. Her CBC from today was repeated and showed a hemoglobin of 19.5 and a hematocrit of 59.  The differential diagnosis was discussed with the patient and her daughter. Secondary causes for polycythemia are a consideration. Her history of emphysema and COPD which can cause chronic hypoxia can result in secondary polycythemia. She is on diuretic which could also cause relative polycythemia secondary to volume depletion.  Polycythemia vera is also consideration given the degree of her polycythemia that is mainly out of proportion to her COPD. To work this up at this time, I will obtain an erythropoietin level as well as JAK 2 mutation for further investigation.  Management standpoint, her hemoglobin is rather high and she would benefit from therapeutic phlebotomy regardless to the cause. Her dyspnea on exertion might actually improve with therapeutic phlebotomy. After discussing the risks and benefits today she is agreeable to proceed and we will have that done next week. I plan on repeating this procedure monthly initially and may be tapered down the line.  2. Thrombosis prophylaxis: She would benefit from continuing aspirin  at this time. It is unclear to me whether she had a previous arterial thrombosis in the past. There is no history of myocardial infarction or strokes. For this reason, aspirin is adequate at this time and we will consider cytoreduction with hydroxyurea if PCV is confirmed.  3. History of lupus anticoagulant: This will be repeated in the future but has no relevance to her current hematological presentation.  4. Follow-up: Will be in 6 weeks to follow on her progress.

## 2016-07-05 NOTE — Telephone Encounter (Signed)
Gave pt cal & avs °

## 2016-07-07 LAB — ERYTHROPOIETIN: ERYTHROPOIETIN: 24.9 m[IU]/mL — AB (ref 2.6–18.5)

## 2016-07-10 ENCOUNTER — Encounter: Payer: Self-pay | Admitting: Internal Medicine

## 2016-07-10 ENCOUNTER — Ambulatory Visit (INDEPENDENT_AMBULATORY_CARE_PROVIDER_SITE_OTHER): Payer: Medicare Other | Admitting: Internal Medicine

## 2016-07-10 ENCOUNTER — Ambulatory Visit (HOSPITAL_BASED_OUTPATIENT_CLINIC_OR_DEPARTMENT_OTHER): Payer: Medicare Other

## 2016-07-10 ENCOUNTER — Telehealth: Payer: Self-pay | Admitting: Pulmonary Disease

## 2016-07-10 VITALS — BP 126/72 | HR 75 | Ht 65.0 in | Wt 172.4 lb

## 2016-07-10 DIAGNOSIS — I2510 Atherosclerotic heart disease of native coronary artery without angina pectoris: Secondary | ICD-10-CM

## 2016-07-10 DIAGNOSIS — D751 Secondary polycythemia: Secondary | ICD-10-CM

## 2016-07-10 DIAGNOSIS — J449 Chronic obstructive pulmonary disease, unspecified: Secondary | ICD-10-CM

## 2016-07-10 DIAGNOSIS — J9611 Chronic respiratory failure with hypoxia: Secondary | ICD-10-CM | POA: Diagnosis not present

## 2016-07-10 MED ORDER — TIOTROPIUM BROMIDE MONOHYDRATE 1.25 MCG/ACT IN AERS: 2.0000 | INHALATION_SPRAY | Freq: Every day | RESPIRATORY_TRACT | 0 refills | Status: DC

## 2016-07-10 NOTE — Progress Notes (Signed)
Subjective:    Patient ID: [name], female    DOB: 1929-03-04, 82 y.o.   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    her dyspnea is out of proportion to her degree of COPD, also of note she quit smoking in 1995. The diffusion capacity on PFTs is decreased out of proportion to degree of airway obstruction as noted by FEV1   She smoked about 40 pyrs  before she quit in 1995 when she had coronary angioplasty. Echo has shown diastolic dysfunction and moderate pulmonary hypertension    04/24/2016  Chief Complaint  Patient presents with  . Follow-up    breathing is doing well. only gets out of breath when walking a long distance, doing much better with exertion.    On her last visit, we put her on Spiriva The seems to have helped her breathing and she seems to be able to walk for longer distances. She continues to walk at [facility] down the hallways She denies orthopnea or paroxysmal nocturnal dyspnea, Oxygen level was low at 89% today rec Stay on spriviva   07/10/2016 acute extended ov/[name] re: GOLD II with disproportionate reduction dlco and  new onset hypoxemia / maint rx spiriva  Chief Complaint  Patient presents with  . Acute Visit    RA pt. pt states she had phlebotomy this morning upon arrvial to [facility] pt 02 was at 24 quickly recovered to 95 on 2L 02. upon arrival to our office pt 02 was 77 recovered to 90 on 2L.    striking hx in that she really does not complain of much change in doe = MMRC2 = can't walk a nl pace on a flat grade s sob but does fine slow and flat eg can walk the hallways at [employer] where she volunteers though admits she usually sits and rests when she gets to where she's going ie nurses stations.  No obvious day to day or daytime variability or assoc excess/ purulent sputum or mucus plugs or hemoptysis  or cp or chest tightness, subjective wheeze or overt sinus or hb symptoms. No unusual exp hx or h/o childhood pna/ asthma or knowledge of premature birth.  Sleeping ok without nocturnal  or early am exacerbation  of respiratory  c/o's or need for noct saba. Also denies any obvious fluctuation of symptoms with weather or environmental changes or other aggravating or alleviating factors except as outlined above   Current Medications, Allergies, Complete Past Medical History, Past Surgical History, Family History, and Social History were reviewed in [employer] record.  ROS  The following are not active complaints unless bolded sore throat, dysphagia, dental problems, itching, sneezing,  nasal congestion or excess/ purulent secretions, ear ache,   fever, chills, sweats, unintended wt loss, classically pleuritic or exertional cp,  orthopnea pnd or leg swelling, presyncope, palpitations, abdominal pain, anorexia, nausea, vomiting, diarrhea  or change in bowel or bladder habits, change in stools or urine, dysuria,hematuria,  rash, arthralgias, visual complaints, headache, numbness, weakness or ataxia or problems with walking or coordination,  change in mood/affect or memory.             Significant tests/ events  Chest x-ray shows increased interstitial prominence compared to her older films from 2009 HRCT 01/2016 >> no evidence of interstitial  lung disease, mild bibasal scarring  PFTs 01/2016 -FEV1 1.15 - 64%, no BD response, DLCO 22%  Echo 12/29/15  - PAS 64  gr 2 DD, nml LV fn      Objective:   Physical Exam   amb stoic wf nad  Wt Readings from Last 3 Encounters:  07/10/16 172 lb 6.4 oz (78.2 kg)  07/05/16 171 lb 12.8 oz (77.9 kg)  04/24/16 172 lb 8 oz (78.2 kg)    Vital signs reviewed -note 67% RA corrected to 90% on 2lpm continuous   HEENT: nl dentition, turbinates, and oropharynx. Nl external ear canals without cough reflex   NECK :  without JVD/Nodes/TM/  nl carotid upstrokes bilaterally   LUNGS: no acc muscle use,  Nl contour chest with distant bilateral  Breath sounds s  wheeze   CV:  RRR  no s3 or murmur or increase in P2, no edema   ABD:  soft and nontender with nl inspiratory excursion in the supine position. No bruits or organomegaly, bowel sounds nl  MS:  Nl gait/ ext warm without deformities, calf tenderness, cyanosis or clubbing No obvious joint restrictions   SKIN: warm and dry without lesions    NEURO:  alert, approp, nl sensorium with  no motor deficits      I personally reviewed images and agree with radiology impression as follows:  CT Chest   02/14/16  No evidence of interstitial lung disease. Probable bibasilar pulmonary parenchymal scarring.     Lab Results  Component Value Date   WBC 7.1 07/05/2016   HGB 19.5 (H) 07/05/2016   HCT 59.3 (H) 07/05/2016   MCV 79.2 (L) 07/05/2016   PLT 142 (L) 07/05/2016       Assessment & Plan:

## 2016-07-10 NOTE — Telephone Encounter (Signed)
Spoke with patient-she will be here to see MW this afternoon at 4:00pm about her noted low O2 levels. Nothing more needed at this time.

## 2016-07-10 NOTE — Progress Notes (Signed)
1048: Therapeutic phlebotomy started via 18 gauge needle to left AC after one unsuccessful attempt. Pt tolerated PIV placement well.   1100: Completed phlebotomy with removal of 518 cc of blood without difficulty. Pt tolerated procedure well. PIV removed and guaze dressing placed to Left AC. Pt given post procedure hydration and snacks. Pt denied any headache or dizziness.   1130: Removed oxygen and pt sats dropped to 86% on room air.. Oxygen saturation upon exertion is 67%.After exertion and sitting pt sats between 79-83% on room air. Pt placed back on 2 liters of oxygen via nasal cannula and sats improved to 95%.  Pt states she is followed by a pulmonalogist but has not been told she needs oxygen before. Pt states she does not notice being more short of breath today but states "I am always short of breath with ambulation and I am so used to it I don't notice it" Pt denies any dizziness.   1210: Dr. Barbaraann Faster aware of pt sats and states pt ok to discharge. Pt educated on signs and symptoms of low oxygen and to follow up with her pulmonalogist. Pt verbalized understanding and had no further questions. Pt left ambulatory in no apparent distress with a family member.

## 2016-07-10 NOTE — Progress Notes (Signed)
Upon arrival to [facility] infusion room, O2 sat noted to be 78% on RA, at rest after deep breathing, O2 sat increased to 82%. 2 L via [place] applied and O2 sat increased to 91%. She reports O2 levels are normally in the 70's, but has noticed feeling more short of breath in the last month. Patient reports feeling "less foggy" and "sharper" once O2 flowing. Pt is not currently on home O2 therapy; is on Spiriva per pulmonologist. This RN informed Dr. Hazeline Junker RN. Phlebotomy complete at this time without complications, patient monitored per protocol.

## 2016-07-10 NOTE — Patient Instructions (Signed)

## 2016-07-10 NOTE — Assessment & Plan Note (Addendum)
Complicated by secondary polycythemia 07/10/2016 > started on 24 h 02 2lpm rest/sleeping and titrated to > 90% walking requing 6lpm

## 2016-07-10 NOTE — Patient Instructions (Addendum)
Stop spiriva and start stiolto 2 pffs each am  X 2 week sample  Please see patient coordinator before you leave today  to schedule home 02 and portable 02 with goal of sats > 90%   Return in 2 weeks see our nurse practioner if [place] not available

## 2016-07-11 ENCOUNTER — Encounter: Payer: Self-pay | Admitting: Internal Medicine

## 2016-07-11 NOTE — Assessment & Plan Note (Addendum)
Phlebotomy x one unit 07/10/2016 and 02 24/7 started 07/10/2016 should gradually improve and not need further phlebotomy > f/u heme/onc planned

## 2016-07-11 NOTE — Assessment & Plan Note (Addendum)
PFT's  01/31/16   FEV1 1.20 (67 % ) ratio 63  p no % improvement from saba p nothing prior to study with classic copd curvature  DLCO  23 % corrects to 37  % for alv volume   - 07/10/2016  After extensive coaching HFA effectiveness =    75% change spiriva to stiolto   dlco much more reduced than FEV1 unusual in copd in absence of significant striking emphysematous changes on hrct and assoc with PH though also has G 2 DD and ? Whether could have missed a shunt but now complicated by hypoxemia/ polycythemia.  The absence of more sympoms is against [place] or TEPAH though these should probably remain in ddx.  The easy correction on 2lpm is against a significant fixed R to L Shunt and favors a diffusion problem which we know she has but can't explain why.   For now rec trial of stiolto and f/u in 2 weeks See chronic resp failure   I had an extended discussion with the patient reviewing all relevant studies completed to date and  lasting 25 minutes of a 40  minute acute extended office visit  Addressing new complications of copd ie hypoxemia and polycythemia  Each maintenance medication was reviewed in detail including most importantly the difference between maintenance and prns and under what circumstances the prns are to be triggered using an action plan format that is not reflected in the computer generated alphabetically organized AVS.    Please see instructions for details which were reviewed in writing and the patient given a copy highlighting the part that I personally wrote and discussed at today's ov.

## 2016-07-12 ENCOUNTER — Other Ambulatory Visit: Payer: Self-pay

## 2016-07-12 MED ORDER — NONFORMULARY OR COMPOUNDED ITEM: 3 refills | Status: DC

## 2016-07-15 ENCOUNTER — Telehealth: Payer: Self-pay | Admitting: Pulmonary Disease

## 2016-07-15 DIAGNOSIS — J449 Chronic obstructive pulmonary disease, unspecified: Secondary | ICD-10-CM

## 2016-07-15 NOTE — Telephone Encounter (Signed)
Okay to order a portable concentrator and oxygen when out of town

## 2016-07-15 NOTE — Telephone Encounter (Signed)
Spoke with pt's daughter and advised that we will place order for POC eval and also get pt set up for oxygen when out of town.

## 2016-07-15 NOTE — Telephone Encounter (Signed)
Spoke with pt's daughter.  PT was started on continuous oxygen at ov with Dr Melvyn Novas last week.  PT is using oxygen concentrator at home at 2-3L and sats are 90-93%.  PT is very active and is having a hard time using larger portable tanks when going out.  PT would like to see if she can get a smaller option for portable possibly a POC.  Also, pt is going out of town to [place] for 3 weeks and will need oxygen while there.  Will need order for [facility] to service pt when [place] the beach.  Please advise if ok to order POC eval and Oxygen while out of town.

## 2016-07-22 DIAGNOSIS — I272 Other secondary pulmonary hypertension: Secondary | ICD-10-CM | POA: Diagnosis not present

## 2016-07-22 DIAGNOSIS — I509 Heart failure, unspecified: Secondary | ICD-10-CM | POA: Diagnosis not present

## 2016-07-22 DIAGNOSIS — J449 Chronic obstructive pulmonary disease, unspecified: Secondary | ICD-10-CM | POA: Diagnosis not present

## 2016-07-22 DIAGNOSIS — J9611 Chronic respiratory failure with hypoxia: Secondary | ICD-10-CM | POA: Diagnosis not present

## 2016-07-23 ENCOUNTER — Ambulatory Visit (INDEPENDENT_AMBULATORY_CARE_PROVIDER_SITE_OTHER): Payer: Medicare Other | Admitting: Physician Assistant

## 2016-07-23 ENCOUNTER — Encounter: Payer: Self-pay | Admitting: Physician Assistant

## 2016-07-23 VITALS — BP 150/76 | HR 59 | Ht 65.0 in | Wt 166.7 lb

## 2016-07-23 DIAGNOSIS — I10 Essential (primary) hypertension: Secondary | ICD-10-CM | POA: Diagnosis not present

## 2016-07-23 DIAGNOSIS — Z79899 Other long term (current) drug therapy: Secondary | ICD-10-CM | POA: Diagnosis not present

## 2016-07-23 DIAGNOSIS — I2510 Atherosclerotic heart disease of native coronary artery without angina pectoris: Secondary | ICD-10-CM

## 2016-07-23 DIAGNOSIS — R55 Syncope and collapse: Secondary | ICD-10-CM | POA: Diagnosis not present

## 2016-07-23 DIAGNOSIS — I5032 Chronic diastolic (congestive) heart failure: Secondary | ICD-10-CM

## 2016-07-23 MED ORDER — ISOSORBIDE MONONITRATE ER 30 MG PO TB24: 30.0000 mg | ORAL_TABLET | Freq: Every day | ORAL | 3 refills | Status: DC

## 2016-07-23 NOTE — Patient Instructions (Signed)
Medications:  Increase Isosorbide to 30 mg (1 tab) daily.   Follow-Up:  Your physician wants you to follow-up in: 6 months with Dr. [place]. You will receive a reminder letter in the mail two months in advance. If you don't receive a letter, please call our office to schedule the follow-up appointment.  If you need a refill on your cardiac medications before your next appointment, please call your pharmacy.

## 2016-07-23 NOTE — Progress Notes (Signed)
Cardiology Office Note   Date:  07/23/2016   ID:  [name], DOB 03/26/1929, MRN [id number]  PCP:  Kelly Ly, MD  Cardiologist:  [name]  Kelly Depinto, PA-C   Chief Complaint  Patient presents with  . Hypertension    f/u    History of Present Illness: [name] is a 82 y.o. female with a history of HTN, HLD, PAD, hyperthyroidism, 1st degree AV block, PTCA to m-RCA 1995 (quit smoking at same year), R CEA 2002 and D-CHF w/ EF 55-60% and grade 2 dd by echo 12/2015, PAS 64. MV 2015 no ischemia, ABI 09-2015 R 0.48, L 0.61. Holter done for palpitations 12/2015 w/ PVCs, some 4 bt runs, BB increased  Patient recently was found to have an polycythemia. H&H were as high as 19.5/59.3. [name] thought the polycythemia was from chronic hypoxia. She had therapeutic phlebotomy, but her oxygen levels were in the 70s with minimal ambulation. She is a former smoker. She has a history of emphysema and COPD, followed by [name]. She had a near syncopal episode felt to be from hypoxia.  She is now on home O2.  [name] presents for follow-up of her hypertension and cardiac issues. Her metoprolol was recently decreased from 100 mg twice a day to 50 mg twice a day  She never gets chest pain. Since she has been started on the oxygen, she has tolerated activity much better. She is on a low dose of Lasix that controls her edema. [name] follows her labs.   She has had some orthostatic dizziness, and he is encouraging her to stand up more slowly, and make sure she gets adequate fluid intake. [name] wishes her systolic blood pressure to be about 140. At home, her systolic blood pressure runs from 137 up to 150.  She denies orthopnea or PND. Since being on the oxygen, her respiratory status has been stable. Her oxygen levels will drop to about 88% with activity, but she does not feel particularly short of breath with this.  She is not limited by claudication symptoms. She will  follow-up with Kelly. [name] and get Dopplers/ABIs in November.   Past Medical History:  Diagnosis Date  . B12 deficiency   . Basal cell cancer    Nose  . Carotid artery occlusion   . Cataract   . Chronic diastolic CHF (congestive heart failure) ([place])   . Colon polyps   . Coronary artery disease    status post angioplasty of the mid RCA in 1995  . First degree atrioventricular block   . History of obesity   . Hyperlipidemia   . Hypertension    well controlled  . Hyperthyroidism   . Lichen sclerosus et atrophicus of the vulva   . Osteopenia   . PAD (peripheral artery disease) ([facility])    with stable claudication    Past Surgical History:  Procedure Laterality Date  . CAROTID ENDARTERECTOMY Right 2002  . CHOLECYSTECTOMY  1999  . ENDARTERECTOMY     right carotid endarterectomy  . KNEE ARTHROSCOPY Left   . MENISCUS REPAIR Left 10/29/2012  . REPAIR TENDONS FOOT      Current Outpatient Prescriptions  Medication Sig Dispense Refill  . aspirin 81 MG tablet Take 81 mg by mouth daily.      . Calcium Carbonate-Vit D-Min (CALCIUM 1200 PO) Take 1 tablet by mouth daily.    . clobetasol cream (TEMOVATE) 0.05 % Apply externally twice a week  30 g 5  . Coenzyme Q10 (COQ10) 50 MG CAPS Take 1 capsule by mouth daily.     Marland Kitchen denosumab (PROLIA) 60 MG/ML SOLN injection Inject 60 mg into the skin every 6 (six) months. Administer in upper arm, thigh, or abdomen    . FOLIC ACID PO Take 1 tablet by mouth daily.      . furosemide (LASIX) 20 MG tablet Take 1 tablet (20 mg total) by mouth daily. 90 tablet 2  . isosorbide mononitrate (IMDUR) 30 MG 24 hr tablet TAKE 1/2 TABLET BY MOUTH EVERY DAY 90 tablet 2  . levothyroxine (SYNTHROID, LEVOTHROID) 137 MCG tablet Take 137 mcg by mouth daily.      . metoprolol (LOPRESSOR) 100 MG tablet Take 50 mg by mouth 2 (two) times daily.  2  . nitroGLYCERIN (NITROSTAT) 0.4 MG SL tablet Place 0.4 mg under the tongue every 5 (five) minutes as needed for chest pain (x 3  pills).    . NONFORMULARY OR COMPOUNDED ITEM Estradiol 0000000 mg [id number] applicators s: insert vaginally twice weekly. 24 each 3  . Omega-3 Fatty Acids (FISH OIL) 1000 MG CAPS Take 1 capsule by mouth 2 (two) times daily.    . rosuvastatin (CRESTOR) 40 MG tablet Take 40 mg by mouth daily.    . Tiotropium Bromide Monohydrate (SPIRIVA RESPIMAT) 1.25 MCG/ACT AERS Inhale 2 puffs into the lungs daily. 1 Inhaler 0  . vitamin B-12 (CYANOCOBALAMIN) 100 MCG tablet Take 100 mcg by mouth daily.     No current facility-administered medications for this visit.     Allergies:   Review of patient's allergies indicates no known allergies.    Social History:  The patient  reports that she quit smoking about 22 years ago. Her smoking use included Cigarettes. She has a 40.00 pack-year smoking history. She has never used smokeless tobacco. She reports that she does not drink alcohol or use drugs.   Family History:  The patient's family history includes Diabetes in her sister; Heart attack (age of onset: 47) in her father; Heart disease in her brother; Hypertension in her brother and father.    ROS:  Please see the history of present illness. All other systems are reviewed and negative.    PHYSICAL EXAM: VS:  BP (!) 150/76   Pulse (!) 59   Ht 5\' 5"  (1.651 m)   Wt 166 lb 11.2 oz (75.6 kg)   BMI 27.74 kg/m  , BMI Body mass index is 27.74 kg/m. GEN: Well nourished, well developed, female in no acute distress  HEENT: normal for age  Neck: no JVD, no carotid bruit, no masses Cardiac: RRR; soft murmur, no rubs, or gallops Respiratory:  Decreased breath sounds bases bilaterally. Few Rales, normal work of breathing GI: soft, nontender, nondistended, + BS MS: no deformity or atrophy; no edema; distal pulses are decreased in both lower extremities   Skin: warm and dry, no rash Neuro:  Strength and sensation are intact Psych: euthymic mood, full affect   EKG:  EKG is ordered today. The ekg ordered today  demonstrates sinus bradycardia, heart rate 59, first-degree AV block with PR 130 ms, sinus arrhythmia   Recent Labs: 01/03/2016: TSH 1.12 07/05/2016: ALT 15; BUN 22.4; Creatinine 1.0; HGB 19.5; Platelets 142; Potassium 4.5; Sodium 139    Lipid Panel No results found for: CHOL, TRIG, HDL, CHOLHDL, VLDL, LDLCALC, LDLDIRECT   Wt Readings from Last 3 Encounters:  07/23/16 166 lb 11.2 oz (75.6 kg)  07/10/16 172 lb 6.4 oz (  78.2 kg)  07/05/16 171 lb 12.8 oz (77.9 kg)     Other studies Reviewed: Additional studies/ records that were reviewed today include: Notes from [name], office records and hospital records  ASSESSMENT AND PLAN:  1.  CAD: She is having no ongoing ischemic symptoms. Now that she is on oxygen, she is remaining active without difficulty. She is on aspirin, Imdur, beta blocker and statin  2. Near syncope: She has had 3 episodes. One was in the setting of acute illness, one was in the setting of dehydration, and this last one is thought to be from hypoxia. She still drives. She was reminded that she needs to be very careful and if she begins to get dizzy and is in the car, she is to pull over immediately. However, her dizziness has been associated with ambulation and position change and she has not had any since she got on the oxygen. She is being more careful.  3. Hypertension: [name] goal for her systolic blood pressure is 140. She generally runs about that, but not very much. Her beta blocker has been decreased and that is appropriate because her resting heart rate is in the 50s and she probably goes down into the low-mid 50s while asleep.  We will increase her Imdur from 15 mg daily up to 30 mg daily. If additional blood pressure control is needed, amlodipine can be added.  4. Chronic diastolic CHF: She is euvolemic by exam, and not having any orthostatic dizziness or other symptoms. Continue current therapy  5. PAD: She is to follow-up with Kelly. [name], symptoms  are under good control.   Current medicines are reviewed at length with the patient today.  The patient does not have concerns regarding medicines.  The following changes have been made:  Increase Imdur  Labs/ tests ordered today include:  No orders of the defined types were placed in this encounter.    Disposition:   FU with [name] in 6 months  Signed, [name]  07/23/2016 9:30 AM    [place] Phone: [phone]; Fax: [phone]  This note was written with the assistance of speech recognition software. Please excuse any transcriptional errors.

## 2016-07-24 ENCOUNTER — Encounter: Payer: Self-pay | Admitting: Adult Health

## 2016-07-24 ENCOUNTER — Ambulatory Visit (INDEPENDENT_AMBULATORY_CARE_PROVIDER_SITE_OTHER): Payer: Medicare Other | Admitting: Adult Health

## 2016-07-24 VITALS — BP 148/72 | HR 66 | Ht 65.0 in | Wt 166.0 lb

## 2016-07-24 DIAGNOSIS — J9611 Chronic respiratory failure with hypoxia: Secondary | ICD-10-CM

## 2016-07-24 DIAGNOSIS — I2510 Atherosclerotic heart disease of native coronary artery without angina pectoris: Secondary | ICD-10-CM

## 2016-07-24 DIAGNOSIS — J449 Chronic obstructive pulmonary disease, unspecified: Secondary | ICD-10-CM | POA: Diagnosis not present

## 2016-07-24 MED ORDER — TIOTROPIUM BROMIDE-OLODATEROL 2.5-2.5 MCG/ACT IN AERS: 2.0000 | INHALATION_SPRAY | Freq: Every day | RESPIRATORY_TRACT | 5 refills | Status: DC

## 2016-07-24 NOTE — Progress Notes (Signed)
Subjective:    Patient ID: [name], female    DOB: [birth year], 82 y.o.   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    her dyspnea is out of proportion to her degree of COPD, also of note she quit smoking in 1995. The diffusion capacity on PFTs is decreased out of proportion to degree of airway obstruction as noted by FEV1   She smoked about 40 pyrs  before she quit in 1995 when she had coronary angioplasty. Echo has shown diastolic dysfunction and moderate pulmonary hypertension    04/24/2016  Chief Complaint  Patient presents with  . Follow-up    breathing is doing well. only gets out of breath when walking a long distance, doing much better with exertion.    On her last visit, we put her on Spiriva The seems to have helped her breathing and she seems to be able to walk for longer distances. She continues to walk at [facility] down the hallways She denies orthopnea or paroxysmal nocturnal dyspnea, Oxygen level was low at 89% today rec Stay on spriviva   07/10/2016 acute extended ov/Wert re: GOLD II with disproportionate reduction dlco and  new onset hypoxemia / maint rx spiriva  Chief Complaint  Patient presents with  . Acute Visit    RA pt. pt states she had phlebotomy this morning upon [facility] to [facility] pt 02 was at 3 quickly recovered to 95 on 2L 02. upon arrival to our office pt 02 was 77 recovered to 90 on 2L.    striking hx in that she really does not complain of much change in doe = MMRC2 = can't walk a nl pace on a flat grade s sob but does fine slow and flat eg can walk the hallways [facility] [employer] [facility] she [facility] though admits she usually sits and rests when she gets to where she's going ie nurses stations. >>change to [employer] , start O2   07/24/16 Follow up ; COPD flare Pt returns for 2 week follow up . She was  seen last ov with increased COPD sx . Changed from Spiriva to [employer] . Desats with ambulation. Started on O2 .  She is feeling better. Less dyspnea.  Wants a POC  To take to the beach trip . She is working with local DME.  Had ONO last night. Results are not available yet. She is following with hematology for polycythemia . She has underwent phlebotomy for this- last 07/10/16. She is recommended to stay on Aspirin .  She denies chest pain, orthopnea, edema.     Current Medications, Allergies, Complete Past Medical History, Past Surgical History, Family History, and Social History were reviewed in [employer] record.  ROS  The following are not active complaints unless bolded sore throat, dysphagia, dental problems, itching, sneezing,  nasal congestion or excess/ purulent secretions, ear ache,   fever, chills, sweats, unintended wt loss, classically pleuritic or exertional cp,  orthopnea pnd or leg swelling, presyncope, palpitations, abdominal pain, anorexia, nausea, vomiting, diarrhea  or change in bowel or bladder habits, change in stools or urine, dysuria,hematuria,  rash, arthralgias, visual complaints, headache, numbness, weakness or ataxia or problems with walking or coordination,  change in mood/affect or memory.  Significant tests/ events  Chest x-ray shows increased interstitial prominence compared to her older films from 2009 HRCT 01/2016 >> no evidence of interstitial lung disease, mild bibasal scarring  PFTs 01/2016 -FEV1 1.15 - 64%, no BD response, DLCO 22%  Echo 12/29/15  - PAS 64  gr 2 DD, nml LV fn      Objective:   Physical Exam   amb wf nad . Vitals:   07/24/16 1053  BP: (!) 148/72  Pulse: 66  SpO2: 91%  Weight: 166 lb (75.3 kg)  Height: 5\' 5"  (1.651 m)     HEENT: nl dentition, turbinates, and oropharynx. Nl external ear canals without cough reflex   NECK :  without JVD/Nodes/TM/ nl carotid upstrokes  bilaterally   LUNGS: no acc muscle use,  Nl contour chest with distant bilateral  Breath sounds s  wheeze   CV:  RRR  no s3 or murmur or increase in P2, no edema   ABD:  soft and nontender with nl inspiratory excursion in the supine position. No bruits or organomegaly, bowel sounds nl  MS:  Nl gait/ ext warm without deformities, calf tenderness, cyanosis or clubbing No obvious joint restrictions   SKIN: warm and dry without lesions    NEURO:  alert, approp, nl sensorium with  no motor deficits     CT Chest   02/14/16  No evidence of interstitial lung disease. Probable bibasilar pulmonary parenchymal scarring.     Lab Results  Component Value Date   WBC 7.1 07/05/2016   HGB 19.5 (H) 07/05/2016   HCT 59.3 (H) 07/05/2016   MCV 79.2 (L) 07/05/2016   PLT 142 (L) 07/05/2016      Jacee Enerson NP-C  [facility]  07/24/16

## 2016-07-24 NOTE — Patient Instructions (Signed)
Continue on Stiolto daily  Order for Portable Oxygen for [place] trip sent to [facility] .  Continue on Oxygen 3l/m continuous flow  Will call with ONO results .  follow up Dr. Elsworth Soho  In 3 months and As needed   Get flu shot when you return .

## 2016-07-25 NOTE — Assessment & Plan Note (Signed)
Recent decline -improved sx control on Stiolto .   Plan  Patient Instructions  Continue on Stiolto daily  Order for Portable Oxygen for [facility] trip sent to [facility] .  Continue on Oxygen 3l/m continuous flow  Will call with ONO results .  follow up Dr. Elsworth Soho  In 3 months and As needed   Get flu shot when you return .

## 2016-07-25 NOTE — Assessment & Plan Note (Signed)
Doing well on O2 .  Set up for POC for trip .   Plan  Patient Instructions  Continue on Stiolto daily  Order for Portable Oxygen for [facility] trip sent to [facility] .  Continue on Oxygen 3l/m continuous flow  Will call with ONO results .  follow up Dr. Elsworth Soho  In 3 months and As needed   Get flu shot when you return .

## 2016-07-26 ENCOUNTER — Telehealth: Payer: Self-pay | Admitting: Internal Medicine

## 2016-07-26 DIAGNOSIS — J9611 Chronic respiratory failure with hypoxia: Secondary | ICD-10-CM

## 2016-07-26 NOTE — Telephone Encounter (Signed)
Per MW- ONO on RA done by [facility] 07-24-17 was abnormal  Needs to start using her o2 2lpm with sleep (she already has this) and repeat ONO on 2lpm Spoke with pt and notified of results per Dr. Melvyn Novas. Pt verbalized understanding and denied any questions. Order was sent to [facility]

## 2016-07-31 ENCOUNTER — Encounter (HOSPITAL_COMMUNITY): Payer: Medicare Other

## 2016-08-03 NOTE — Progress Notes (Signed)
Reviewed & agree with plan  

## 2016-08-06 ENCOUNTER — Other Ambulatory Visit: Payer: Self-pay | Admitting: Cardiology

## 2016-08-06 ENCOUNTER — Encounter: Payer: Self-pay | Admitting: *Deleted

## 2016-08-06 NOTE — Telephone Encounter (Signed)
Rx request sent to pharmacy.  

## 2016-08-19 ENCOUNTER — Other Ambulatory Visit (HOSPITAL_COMMUNITY): Payer: Self-pay | Admitting: *Deleted

## 2016-08-20 ENCOUNTER — Encounter (HOSPITAL_COMMUNITY)
Admission: RE | Admit: 2016-08-20 | Discharge: 2016-08-20 | Disposition: A | Discharge status: Home / Self Care | Payer: Medicare Other | Source: Ambulatory Visit | Attending: Internal Medicine | Admitting: Internal Medicine

## 2016-08-20 DIAGNOSIS — M810 Age-related osteoporosis without current pathological fracture: Secondary | ICD-10-CM | POA: Diagnosis not present

## 2016-08-20 MED ORDER — DENOSUMAB 60 MG/ML ~~LOC~~ SOLN
60.0000 mg | Freq: Once | SUBCUTANEOUS | Status: AC
  Administered 2016-08-20: 60 mg via SUBCUTANEOUS
  Filled 2016-08-20: qty 1

## 2016-08-21 ENCOUNTER — Other Ambulatory Visit (HOSPITAL_BASED_OUTPATIENT_CLINIC_OR_DEPARTMENT_OTHER): Payer: Medicare Other

## 2016-08-21 ENCOUNTER — Telehealth: Payer: Self-pay | Admitting: Oncology

## 2016-08-21 ENCOUNTER — Ambulatory Visit (HOSPITAL_BASED_OUTPATIENT_CLINIC_OR_DEPARTMENT_OTHER): Payer: Medicare Other | Admitting: Oncology

## 2016-08-21 VITALS — BP 185/63 | HR 73 | Temp 98.0°F | Resp 19 | Wt 167.0 lb

## 2016-08-21 DIAGNOSIS — D751 Secondary polycythemia: Secondary | ICD-10-CM | POA: Diagnosis not present

## 2016-08-21 DIAGNOSIS — R0902 Hypoxemia: Secondary | ICD-10-CM

## 2016-08-21 DIAGNOSIS — J449 Chronic obstructive pulmonary disease, unspecified: Secondary | ICD-10-CM

## 2016-08-21 DIAGNOSIS — I2510 Atherosclerotic heart disease of native coronary artery without angina pectoris: Secondary | ICD-10-CM

## 2016-08-21 LAB — CBC WITH DIFFERENTIAL/PLATELET
BASO%: 0.3 % (ref 0.0–2.0)
Basophils Absolute: 0 10*3/uL (ref 0.0–0.1)
EOS ABS: 0 10*3/uL (ref 0.0–0.5)
EOS%: 0.2 % (ref 0.0–7.0)
HCT: 50.7 % — ABNORMAL HIGH (ref 34.8–46.6)
HGB: 16.5 g/dL — ABNORMAL HIGH (ref 11.6–15.9)
LYMPH%: 14 % (ref 14.0–49.7)
MCH: 26.2 pg (ref 25.1–34.0)
MCHC: 32.5 g/dL (ref 31.5–36.0)
MCV: 80.5 fL (ref 79.5–101.0)
MONO#: 0.5 10*3/uL (ref 0.1–0.9)
MONO%: 8.4 % (ref 0.0–14.0)
NEUT%: 77.1 % — AB (ref 38.4–76.8)
NEUTROS ABS: 4.6 10*3/uL (ref 1.5–6.5)
PLATELETS: 133 10*3/uL — AB (ref 145–400)
RBC: 6.3 10*6/uL — AB (ref 3.70–5.45)
RDW: 16.7 % — ABNORMAL HIGH (ref 11.2–14.5)
WBC: 6 10*3/uL (ref 3.9–10.3)
lymph#: 0.8 10*3/uL — ABNORMAL LOW (ref 0.9–3.3)

## 2016-08-21 NOTE — Telephone Encounter (Signed)
Gave patient avs report and appointments for January  °

## 2016-08-21 NOTE — Progress Notes (Signed)
Hematology and Oncology Follow Up Visit  [name] [id number] 01-04-1929 82 y.o. 08/21/2016 10:05 AM [name], MDPerini, Elta Guadeloupe, MD   Principle Diagnosis: 82 year old woman without polycythemia due to secondary causes including chronic hypoxia and COPD. This was diagnosed in July of 2017 after presenting with a hemoglobin of 19.5.   Prior Therapy: She is status post phlebotomy done on 07/05/2016.  Current therapy: Aspirin 81 mg daily as well as oxygen supplementation.  Interim History: [name] presents today for a follow-up visit. Since the last visit, she received 1 phlebotomy and started on oxygen. Since she started on oxygen she has felt better and her exercise tolerance and energy improved. She denied any shortness of breath, cough or wheezing. She denied any thrombosis or bleeding episodes. She denied any falls or syncope. She tolerated phlebotomy without any major complications although she does not report any major changes in her symptoms.  She does not report any headaches, seizures or syncope. She does not report any fevers, chills, sweats or weight loss. She does not report any chest pain, palpitation, orthopnea or leg edema. She does not report any cough, wheezing or hemoptysis. She does not report any nausea, vomiting, abdominal pain, although satiety or abdominal distention. She does not report any constipation or diarrhea. She does not report any frequency urgency or hesitancy. She does not report any skeletal complaints. Remaining review of systems unremarkable.   Medications: I have reviewed the patient's current medications.  Current Outpatient Prescriptions  Medication Sig Dispense Refill  . aspirin 81 MG tablet Take 81 mg by mouth daily.      . Calcium Carbonate-Vit D-Min (CALCIUM 1200 PO) Take 1 tablet by mouth daily.    . clobetasol cream (TEMOVATE) 0.05 % Apply externally twice a week 30 g 5  . Coenzyme Q10 (COQ10) 50 MG CAPS Take 1 capsule by mouth daily.      Marland Kitchen denosumab (PROLIA) 60 MG/ML SOLN injection Inject 60 mg into the skin every 6 (six) months. Administer in upper arm, thigh, or abdomen    . FOLIC ACID PO Take 1 tablet by mouth daily.      . furosemide (LASIX) 20 MG tablet TAKE 1 TABLET(20 MG) BY MOUTH DAILY 90 tablet 1  . isosorbide mononitrate (IMDUR) 30 MG 24 hr tablet Take 1 tablet (30 mg total) by mouth daily. 90 tablet 3  . levothyroxine (SYNTHROID, LEVOTHROID) 137 MCG tablet Take 137 mcg by mouth daily.      . metoprolol (LOPRESSOR) 100 MG tablet Take 50 mg by mouth 2 (two) times daily.  2  . nitroGLYCERIN (NITROSTAT) 0.4 MG SL tablet Place 0.4 mg under the tongue every 5 (five) minutes as needed for chest pain (x 3 pills).    . NONFORMULARY OR COMPOUNDED ITEM Estradiol 0000000 mg [id number] applicators s: insert vaginally twice weekly. 24 each 3  . Omega-3 Fatty Acids (FISH OIL) 1000 MG CAPS Take 1 capsule by mouth 2 (two) times daily.    . rosuvastatin (CRESTOR) 40 MG tablet Take 40 mg by mouth daily.    . Tiotropium Bromide Monohydrate (SPIRIVA RESPIMAT) 1.25 MCG/ACT AERS Inhale 2 puffs into the lungs daily. 1 Inhaler 0  . vitamin B-12 (CYANOCOBALAMIN) 100 MCG tablet Take 100 mcg by mouth daily.     No current facility-administered medications for this visit.      Allergies: No Known Allergies  Past Medical History, Surgical history, Social history, and Family History were reviewed and updated.  Physical Exam: Blood pressure Marland Kitchen)  185/63, pulse 73, temperature 98 F (36.7 C), temperature source Oral, resp. rate 19, weight 167 lb (75.8 kg), SpO2 90 %. ECOG: 1 General appearance: alert and cooperative Head: Normocephalic, without obvious abnormality Neck: no adenopathy Lymph nodes: Cervical, supraclavicular, and axillary nodes normal. Heart:regular rate and rhythm, S1, S2 normal, no murmur, click, rub or gallop Lung:chest clear, no wheezing, rales, normal symmetric air entry Abdomin: soft, non-tender, without masses or  organomegaly EXT:no erythema, induration, or nodules   Lab Results: Lab Results  Component Value Date   WBC 6.0 08/21/2016   HGB 16.5 (H) 08/21/2016   HCT 50.7 (H) 08/21/2016   MCV 80.5 08/21/2016   PLT 133 (L) 08/21/2016     Chemistry      Component Value Date/Time   NA 139 07/05/2016 1152   K 4.5 07/05/2016 1152   CL 102 11/01/2014 1636   CO2 24 07/05/2016 1152   BUN 22.4 07/05/2016 1152   CREATININE 1.0 07/05/2016 1152      Component Value Date/Time   CALCIUM 10.0 07/05/2016 1152   ALKPHOS 60 07/05/2016 1152   AST 27 07/05/2016 1152   ALT 15 07/05/2016 1152   BILITOT 0.98 07/05/2016 1152       Impression and Plan:  82 year old woman with the following issues:  1. Polycythemia that has been evolving over 4 years and documented in July 2017. Her CBC from today was repeated and showed a hemoglobin of 19.5 and a hematocrit of 59.  The etiology of her polycythemia is related to chronic hypoxia and lung disease. Polycythemia workup has been unrevealing with negative JAK2 mutation. She received one phlebotomy and her hemoglobin improved within normal range at this time. I anticipate her improvement in her hemoglobin is related to treating her hypoxia rather than phlebotomy.  I recommend continued observation and repeat her CBC in 3 months. If her hemoglobin continues to be within normal range, no phlebotomy is needed and no further hematology follow-up will be required. Her hemoglobin starts to rise again, repeated phlebotomy may be done at that time.  2. Thrombosis prophylaxis: She will continue low-dose aspirin which is recommended at this time.  3. Follow-up: Will be in 3 months.   [id number], MD 10/4/201710:05 AM

## 2016-08-28 ENCOUNTER — Ambulatory Visit: Payer: Medicare Other | Admitting: Adult Health

## 2016-08-31 DIAGNOSIS — Z23 Encounter for immunization: Secondary | ICD-10-CM | POA: Diagnosis not present

## 2016-09-04 ENCOUNTER — Encounter: Payer: Self-pay | Admitting: Internal Medicine

## 2016-09-25 DIAGNOSIS — I10 Essential (primary) hypertension: Secondary | ICD-10-CM | POA: Diagnosis not present

## 2016-09-25 DIAGNOSIS — Z6829 Body mass index (BMI) 29.0-29.9, adult: Secondary | ICD-10-CM | POA: Diagnosis not present

## 2016-09-25 DIAGNOSIS — I2510 Atherosclerotic heart disease of native coronary artery without angina pectoris: Secondary | ICD-10-CM | POA: Diagnosis not present

## 2016-09-25 DIAGNOSIS — J449 Chronic obstructive pulmonary disease, unspecified: Secondary | ICD-10-CM | POA: Diagnosis not present

## 2016-09-25 DIAGNOSIS — R7301 Impaired fasting glucose: Secondary | ICD-10-CM | POA: Diagnosis not present

## 2016-09-25 DIAGNOSIS — I509 Heart failure, unspecified: Secondary | ICD-10-CM | POA: Diagnosis not present

## 2016-09-25 DIAGNOSIS — J9610 Chronic respiratory failure, unspecified whether with hypoxia or hypercapnia: Secondary | ICD-10-CM | POA: Diagnosis not present

## 2016-09-27 ENCOUNTER — Encounter: Payer: Self-pay | Admitting: Family

## 2016-09-27 ENCOUNTER — Other Ambulatory Visit: Payer: Self-pay | Admitting: *Deleted

## 2016-09-27 DIAGNOSIS — I739 Peripheral vascular disease, unspecified: Secondary | ICD-10-CM

## 2016-09-27 DIAGNOSIS — I6523 Occlusion and stenosis of bilateral carotid arteries: Secondary | ICD-10-CM

## 2016-10-02 ENCOUNTER — Ambulatory Visit (INDEPENDENT_AMBULATORY_CARE_PROVIDER_SITE_OTHER): Payer: Medicare Other | Admitting: Family

## 2016-10-02 ENCOUNTER — Ambulatory Visit (HOSPITAL_COMMUNITY)
Admission: RE | Admit: 2016-10-02 | Discharge: 2016-10-02 | Disposition: A | Discharge status: Home / Self Care | Payer: Medicare Other | Source: Ambulatory Visit | Attending: Vascular Surgery | Admitting: Vascular Surgery

## 2016-10-02 ENCOUNTER — Encounter: Payer: Self-pay | Admitting: Family

## 2016-10-02 ENCOUNTER — Ambulatory Visit (INDEPENDENT_AMBULATORY_CARE_PROVIDER_SITE_OTHER)
Admission: RE | Admit: 2016-10-02 | Discharge: 2016-10-02 | Disposition: A | Discharge status: Home / Self Care | Payer: Medicare Other | Source: Ambulatory Visit | Attending: Vascular Surgery | Admitting: Vascular Surgery

## 2016-10-02 VITALS — BP 156/84 | HR 82 | Temp 97.4°F | Resp 16 | Ht 65.0 in | Wt 166.6 lb

## 2016-10-02 DIAGNOSIS — Z87891 Personal history of nicotine dependence: Secondary | ICD-10-CM

## 2016-10-02 DIAGNOSIS — Z9889 Other specified postprocedural states: Secondary | ICD-10-CM

## 2016-10-02 DIAGNOSIS — R0989 Other specified symptoms and signs involving the circulatory and respiratory systems: Secondary | ICD-10-CM | POA: Diagnosis present

## 2016-10-02 DIAGNOSIS — I779 Disorder of arteries and arterioles, unspecified: Secondary | ICD-10-CM

## 2016-10-02 DIAGNOSIS — I6523 Occlusion and stenosis of bilateral carotid arteries: Secondary | ICD-10-CM | POA: Diagnosis not present

## 2016-10-02 DIAGNOSIS — I6521 Occlusion and stenosis of right carotid artery: Secondary | ICD-10-CM

## 2016-10-02 DIAGNOSIS — I739 Peripheral vascular disease, unspecified: Secondary | ICD-10-CM | POA: Diagnosis not present

## 2016-10-02 DIAGNOSIS — E785 Hyperlipidemia, unspecified: Secondary | ICD-10-CM | POA: Diagnosis not present

## 2016-10-02 DIAGNOSIS — I10 Essential (primary) hypertension: Secondary | ICD-10-CM | POA: Insufficient documentation

## 2016-10-02 DIAGNOSIS — I771 Stricture of artery: Secondary | ICD-10-CM | POA: Diagnosis not present

## 2016-10-02 NOTE — Patient Instructions (Signed)
Stroke Prevention Some medical conditions and behaviors are associated with an increased chance of having a stroke. You may prevent a stroke by making healthy choices and managing medical conditions. How can I reduce my risk of having a stroke?  Stay physically active. Get at least 30 minutes of activity on most or all days.  Do not smoke. It may also be helpful to avoid exposure to secondhand smoke.  Limit alcohol use. Moderate alcohol use is considered to be:  No more than 2 drinks per day for men.  No more than 1 drink per day for nonpregnant women.  Eat healthy foods. This involves:  Eating 5 or more servings of fruits and vegetables a day.  Making dietary changes that address high blood pressure (hypertension), high cholesterol, diabetes, or obesity.  Manage your cholesterol levels.  Making food choices that are high in fiber and low in saturated fat, trans fat, and cholesterol may control cholesterol levels.  Take any prescribed medicines to control cholesterol as directed by your health care provider.  Manage your diabetes.  Controlling your carbohydrate and sugar intake is recommended to manage diabetes.  Take any prescribed medicines to control diabetes as directed by your health care provider.  Control your hypertension.  Making food choices that are low in salt (sodium), saturated fat, trans fat, and cholesterol is recommended to manage hypertension.  Ask your health care provider if you need treatment to lower your blood pressure. Take any prescribed medicines to control hypertension as directed by your health care provider.  If you are 18-39 years of age, have your blood pressure checked every 3-5 years. If you are 40 years of age or older, have your blood pressure checked every year.  Maintain a healthy weight.  Reducing calorie intake and making food choices that are low in sodium, saturated fat, trans fat, and cholesterol are recommended to manage  weight.  Stop drug abuse.  Avoid taking birth control pills.  Talk to your health care provider about the risks of taking birth control pills if you are over 35 years old, smoke, get migraines, or have ever had a blood clot.  Get evaluated for sleep disorders (sleep apnea).  Talk to your health care provider about getting a sleep evaluation if you snore a lot or have excessive sleepiness.  Take medicines only as directed by your health care provider.  For some people, aspirin or blood thinners (anticoagulants) are helpful in reducing the risk of forming abnormal blood clots that can lead to stroke. If you have the irregular heart rhythm of atrial fibrillation, you should be on a blood thinner unless there is a good reason you cannot take them.  Understand all your medicine instructions.  Make sure that other conditions (such as anemia or atherosclerosis) are addressed. Get help right away if:  You have sudden weakness or numbness of the face, arm, or leg, especially on one side of the body.  Your face or eyelid droops to one side.  You have sudden confusion.  You have trouble speaking (aphasia) or understanding.  You have sudden trouble seeing in one or both eyes.  You have sudden trouble walking.  You have dizziness.  You have a loss of balance or coordination.  You have a sudden, severe headache with no known cause.  You have new chest pain or an irregular heartbeat. Any of these symptoms may represent a serious problem that is an emergency. Do not wait to see if the symptoms will go away.   Get medical help at once. Call your local emergency services (911 in [place] Do not drive yourself to the hospital.  This information is not intended to replace advice given to you by your health care provider. Make sure you discuss any questions you have with your health care provider. Document Released: 12/12/2004 Document Revised: 04/11/2016 Document Reviewed: 05/07/2013 Elsevier  Interactive Patient Education  2017 Elsevier Inc.      Peripheral Vascular Disease Peripheral vascular disease (PVD) is a disease of the blood vessels that are not part of your heart and brain. A simple term for PVD is poor circulation. In most cases, PVD narrows the blood vessels that carry blood from your heart to the rest of your body. This can result in a decreased supply of blood to your arms, legs, and internal organs, like your stomach or kidneys. However, it most often affects a person's lower legs and feet. There are two types of PVD.  Organic PVD. This is the more common type. It is caused by damage to the structure of blood vessels.  Functional PVD. This is caused by conditions that make blood vessels contract and tighten (spasm). Without treatment, PVD tends to get worse over time. PVD can also lead to acute ischemic limb. This is when an arm or limb suddenly has trouble getting enough blood. This is a medical emergency. Follow these instructions at home:  Take medicines only as told by your doctor.  Do not use any tobacco products, including cigarettes, chewing tobacco, or electronic cigarettes. If you need help quitting, ask your doctor.  Lose weight if you are overweight, and maintain a healthy weight as told by your doctor.  Eat a diet that is low in fat and cholesterol. If you need help, ask your doctor.  Exercise regularly. Ask your doctor for some good activities for you.  Take good care of your feet.  Wear comfortable shoes that fit well.  Check your feet often for any cuts or sores. Contact a doctor if:  You have cramps in your legs while walking.  You have leg pain when you are at rest.  You have coldness in a leg or foot.  Your skin changes.  You are unable to get or have an erection (erectile dysfunction).  You have cuts or sores on your feet that are not healing. Get help right away if:  Your arm or leg turns cold and blue.  Your arms or legs  become red, warm, swollen, painful, or numb.  You have chest pain or trouble breathing.  You suddenly have weakness in your face, arm, or leg.  You become very confused or you cannot speak.  You suddenly have a very bad headache.  You suddenly cannot see. This information is not intended to replace advice given to you by your health care provider. Make sure you discuss any questions you have with your health care provider. Document Released: 01/29/2010 Document Revised: 04/11/2016 Document Reviewed: 04/14/2014 Elsevier Interactive Patient Education  2017 Elsevier Inc.  

## 2016-10-02 NOTE — Progress Notes (Signed)
[facility] [place] HISTORY AND PHYSICAL   MRN : [id number]  History of Present Illness:   [name] is a 82 y.o. female patient that Dr. Scot Dock has been monitoring status post right CEA on 03/09/2001 and for PAD. She returns today for follow up.  She did have evidence of chronic venous insufficiency and she was encouraged to continue her water aerobics and also to elevate her legs intermittently.  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 She had a torn meniscus in left knee, was repaired Dec. 2013, and left leg feels better.  She has no history of TIA or stroke symptoms. Specifically the patient denies a history of amaurosis fugax or monocular blindness, unilateral facial drooping, hemiplegia, or receptive or expressive aphasia.   Pt states she wants her results to go to Dr. Peter Martinique also.   She denies history of MI but had ischemic heart disease and had a cardiac stent placed in 1995, quit smoking in 1995, she does not have DM. She states that she has known PVC's, can go weeks without noticing any, then have lots of PVC's at other times, states her PCP and cardiologist are aware of this.  She has been diagnosed with CHF in November 2015.  She smoked x 40 years, quit in 1995. She started using 2L O2/[place] in August 2017 for COPD and pulmonary hypertension; states her room air oxygen saturation was 67%, is 85% on 2L.   Pt meds include: Statin :Yes Betablocker: Yes ASA: Yes Other anticoagulants/antiplatelets: no   Current Outpatient Prescriptions  Medication Sig Dispense Refill  . aspirin 81 MG tablet Take 81 mg by mouth daily.      . clobetasol cream (TEMOVATE) 0.05 % Apply externally twice a week 30 g 5  . Coenzyme Q10 (COQ10) 50 MG CAPS Take 1 capsule by mouth daily.     Marland Kitchen denosumab (PROLIA) 60 MG/ML SOLN injection Inject 60 mg into the skin every 6 (six)  months. Administer in upper arm, thigh, or abdomen    . FOLIC ACID PO Take 1 tablet by mouth daily.      . furosemide (LASIX) 20 MG tablet TAKE 1 TABLET(20 MG) BY MOUTH DAILY 90 tablet 1  . isosorbide mononitrate (IMDUR) 30 MG 24 hr tablet Take 1 tablet (30 mg total) by mouth daily. 90 tablet 3  . levothyroxine (SYNTHROID, LEVOTHROID) 137 MCG tablet Take 137 mcg by mouth daily.      . metoprolol (LOPRESSOR) 100 MG tablet Take 50 mg by mouth 2 (two) times daily.  2  . nitroGLYCERIN (NITROSTAT) 0.4 MG SL tablet Place 0.4 mg under the tongue every 5 (five) minutes as needed for chest pain (x 3 pills).    . NONFORMULARY OR COMPOUNDED ITEM Estradiol 0000000 mg [id number] applicators s: insert vaginally twice weekly. 24 each 3  . Omega-3 Fatty Acids (FISH OIL) 1000 MG CAPS Take 1 capsule by mouth 2 (two) times daily.    . rosuvastatin (CRESTOR) 40 MG tablet Take 40 mg by mouth daily.    Marland Kitchen STIOLTO RESPIMAT 2.5-2.5 MCG/ACT AERS INL 2 PFS ITL D  5  . Tiotropium Bromide Monohydrate (SPIRIVA RESPIMAT) 1.25 MCG/ACT AERS Inhale 2 puffs into the lungs daily. 1 Inhaler 0  . valsartan (DIOVAN) 40 MG tablet TK 1 T PO QD  11  . vitamin B-12 (CYANOCOBALAMIN) 100 MCG tablet Take 100 mcg by mouth daily.    Marland Kitchen  Calcium Carbonate-Vit D-Min (CALCIUM 1200 PO) Take 1 tablet by mouth daily.     No current facility-administered medications for this visit.     Past Medical History:  Diagnosis Date  . B12 deficiency   . Basal cell cancer    Nose  . Carotid artery occlusion   . Cataract   . Chronic diastolic CHF (congestive heart failure) ([place])   . Colon polyps   . Coronary artery disease    status post angioplasty of the mid RCA in 1995  . First degree atrioventricular block   . History of obesity   . Hyperlipidemia   . Hypertension    well controlled  . Hyperthyroidism   . Lichen sclerosus et atrophicus of the vulva   . Osteopenia   . PAD (peripheral artery disease) ([facility])    with stable claudication    Social  History Social History  Substance Use Topics  . Smoking status: Former Smoker    Packs/day: 1.00    Years: 40.00    Types: Cigarettes    Quit date: 11/18/1993  . Smokeless tobacco: Never Used  . Alcohol use No    Family History Family History  Problem Relation Age of Onset  . Heart attack Father 8  . Hypertension Father   . Diabetes Sister   . Heart disease Brother     underwent coronary bypass grafting at age 57  . Hypertension Brother     Surgical History Past Surgical History:  Procedure Laterality Date  . CAROTID ENDARTERECTOMY Right 2002  . CHOLECYSTECTOMY  1999  . ENDARTERECTOMY     right carotid endarterectomy  . KNEE ARTHROSCOPY Left   . MENISCUS REPAIR Left 10/29/2012  . REPAIR TENDONS FOOT      No Known Allergies  Current Outpatient Prescriptions  Medication Sig Dispense Refill  . aspirin 81 MG tablet Take 81 mg by mouth daily.      . clobetasol cream (TEMOVATE) 0.05 % Apply externally twice a week 30 g 5  . Coenzyme Q10 (COQ10) 50 MG CAPS Take 1 capsule by mouth daily.     Marland Kitchen denosumab (PROLIA) 60 MG/ML SOLN injection Inject 60 mg into the skin every 6 (six) months. Administer in upper arm, thigh, or abdomen    . FOLIC ACID PO Take 1 tablet by mouth daily.      . furosemide (LASIX) 20 MG tablet TAKE 1 TABLET(20 MG) BY MOUTH DAILY 90 tablet 1  . isosorbide mononitrate (IMDUR) 30 MG 24 hr tablet Take 1 tablet (30 mg total) by mouth daily. 90 tablet 3  . levothyroxine (SYNTHROID, LEVOTHROID) 137 MCG tablet Take 137 mcg by mouth daily.      . metoprolol (LOPRESSOR) 100 MG tablet Take 50 mg by mouth 2 (two) times daily.  2  . nitroGLYCERIN (NITROSTAT) 0.4 MG SL tablet Place 0.4 mg under the tongue every 5 (five) minutes as needed for chest pain (x 3 pills).    . NONFORMULARY OR COMPOUNDED ITEM Estradiol 0000000 mg [id number] applicators s: insert vaginally twice weekly. 24 each 3  . Omega-3 Fatty Acids (FISH OIL) 1000 MG CAPS Take 1 capsule by mouth 2 (two) times daily.     . rosuvastatin (CRESTOR) 40 MG tablet Take 40 mg by mouth daily.    Marland Kitchen STIOLTO RESPIMAT 2.5-2.5 MCG/ACT AERS INL 2 PFS ITL D  5  . Tiotropium Bromide Monohydrate (SPIRIVA RESPIMAT) 1.25 MCG/ACT AERS Inhale 2 puffs into the lungs daily. 1 Inhaler 0  . valsartan (DIOVAN) 40  MG tablet TK 1 T PO QD  11  . vitamin B-12 (CYANOCOBALAMIN) 100 MCG tablet Take 100 mcg by mouth daily.    . Calcium Carbonate-Vit D-Min (CALCIUM 1200 PO) Take 1 tablet by mouth daily.     No current facility-administered medications for this visit.      REVIEW OF SYSTEMS: See HPI for pertinent positives and negatives.  Physical Examination Vitals:   10/02/16 1508 10/02/16 1512 10/02/16 1513  BP: (!) 148/83 (!) 155/92 (!) 156/84  Pulse: 82 82 82  Resp: 16    Temp: 97.4 F (36.3 C)    SpO2: (!) 85%    Weight: 166 lb 9.6 oz (75.6 kg)    Height: 5\' 5"  (1.651 m)     Body mass index is 27.72 kg/m.  General:  WDWN in NAD, female. Gait: normal HENT: WNL Eyes: PERRLA Pulmonary: Breathing slightly labored at rest, good air movement in all fields, no rales, rhonchi, or wheezing. Using 2L/[place] supplemental O2. Cardiac: Regular rhythm, no detected murmur  Abdomen: soft, NT, no palpated masses Skin: no rashes, no ulcers, no cellulitis.   Vascular Exam/Pulses: VASCULAR EXAM  Carotid Bruits Left Right   Negative Positive   Aorta is not palpable. Radial pulses right is 2+, left is not palpable. Brachial pulses: 3+ bilaterally.   VASCULAR EXAM: Extremities without ischemic changes  without Gangrene; without open wounds. Hemosiderin deposits left lateral calf, no peripheral edema.      LE Pulses LEFT RIGHT   FEMORAL  palpable  palpable    POPLITEAL not palpable  not palpable   POSTERIOR TIBIAL  not palpable  not palpable    DORSALIS PEDIS  ANTERIOR TIBIAL not palpable  not palpable      Musculoskeletal: no muscle wasting or atrophy. Neurologic: A&O X 3; Appropriate Affect ;  SENSATION: normal; MOTOR FUNCTION: 5/5 Symmetric, CN 2-12 intact Speech is fluent/normal     ASSESSMENT:  VIRDA TITUS is a 82 y.o. female who is s/p right CEA on 03/09/2001 and is also followed for PAD. She has no history of stroke or TIA, does have known left subclavian stenosis but no steal symptoms. She can walk as far as she wants with no claudication symptoms in her legs.  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  She has no signs of ischemia in her legs/feet.     DATA Carotid Duplex today suggests a  patent right ICA with history of carotid endarterectomy, no hyperplasia or hemodynamically significant plaque, and minimal left ICA stenosis. Retrograde left vertebral artery and stenotic left subclavian artery.  Antegrade right vertebral artery with normal waveforms.  Essentially unchanged since previous study on 09-27-15.   ABI's have improved bilaterally: right is 0.70 from 0.48, normal TBI of 0.73; left is 0.77 from 0.61, normal TBI of 0.73; all monophasic waveforms.    PLAN:   Based on today's exam and non-invasive vascular lab results, the patient will follow up in 1 year with the following tests: carotid duplex and ABI's.  I discussed in depth with the patient the nature of atherosclerosis, and emphasized the importance of maximal medical management including strict control of blood pressure, blood glucose, and lipid levels, obtaining regular exercise, and cessation of smoking.  The patient is aware that without maximal medical management the underlying atherosclerotic disease process will progress, limiting the benefit of any interventions.  The patient was given information about stroke prevention and what symptoms should prompt the patient to  seek immediate  medical care.  The patient was given information about PAD including signs, symptoms, treatment, what symptoms should prompt the patient to seek immediate medical care, and risk reduction measures to take. Thank you for allowing [place] to participate in this patient's care.  Clemon Chambers, RN, MSN, FNP-C [facility] Office: [phone]  Clinic MD: Scot Dock 10/02/2016 3:50 PM

## 2016-10-07 NOTE — Addendum Note (Signed)
Addended by: Mena Goes on: 10/07/2016 02:20 PM   Modules accepted: Orders

## 2016-11-04 ENCOUNTER — Encounter: Payer: Medicare Other | Admitting: Gynecology

## 2016-11-04 ENCOUNTER — Encounter: Payer: Self-pay | Admitting: Gynecology

## 2016-11-04 DIAGNOSIS — S52572A Other intraarticular fracture of lower end of left radius, initial encounter for closed fracture: Secondary | ICD-10-CM | POA: Diagnosis not present

## 2016-11-04 DIAGNOSIS — M545 Low back pain: Secondary | ICD-10-CM | POA: Diagnosis not present

## 2016-11-05 ENCOUNTER — Telehealth: Payer: Self-pay | Admitting: Pulmonary Disease

## 2016-11-05 NOTE — Telephone Encounter (Signed)
lmomtcb x 2  

## 2016-11-05 NOTE — Telephone Encounter (Signed)
Pt calling back [phone]

## 2016-11-05 NOTE — Telephone Encounter (Signed)
Pt states she received clarification on this through her orthopedic doc.  Nothing further needed.

## 2016-11-05 NOTE — Telephone Encounter (Signed)
Pt returning call.[name] ° °

## 2016-11-05 NOTE — Telephone Encounter (Signed)
lmtcb for pt.  

## 2016-11-06 ENCOUNTER — Telehealth: Payer: Self-pay | Admitting: Pulmonary Disease

## 2016-11-06 ENCOUNTER — Other Ambulatory Visit: Payer: Self-pay | Admitting: Orthopedic Surgery

## 2016-11-06 ENCOUNTER — Ambulatory Visit: Payer: Medicare Other | Admitting: Pulmonary Disease

## 2016-11-06 ENCOUNTER — Encounter (HOSPITAL_COMMUNITY): Payer: Self-pay | Admitting: *Deleted

## 2016-11-06 ENCOUNTER — Telehealth: Payer: Self-pay | Admitting: Cardiology

## 2016-11-06 NOTE — Telephone Encounter (Signed)
Will forward for dr jordan's review and advise

## 2016-11-06 NOTE — Telephone Encounter (Signed)
Spoke with Transport planner at [place]. Advised her that the pt would need an appointment before we could clear her for surgery. She states that she will speak with the doctor and let us know what we are to do.

## 2016-11-06 NOTE — Telephone Encounter (Signed)
[name] returned phone call

## 2016-11-06 NOTE — Telephone Encounter (Signed)
She is clear for wrist surgery from my standpoint.  Peter Martinique MD, [facility]

## 2016-11-06 NOTE — Progress Notes (Signed)
Anesthesia chart review: SAME DAY WORK-UP.  Patient is a 82 year old female scheduled for open treatment of left distal radius fracture on 11/08/16 by Dr. Milly Jakob. Case is posted for MAC with regional block.  History includes former smoker (quit '95), HTN, HLD, hypothyroidism (on levothyroxine), PAD/claudication, skin cancer ([facility]), CAD s/p PCI RCA '95, chronic diastolic CHF, carotid occlusive disease s/p right CEA 04/08/01 and left subclavian artery stenosis, dyspnea, COPD, arthritis, cholecystectomy, polycythemia due to COPD/chronic hypoxia (negative JAK2 mutation) s/p phlebotomy 07/05/16. By [facility] notes, she started using 2L O2/[place] in August 2017 for COPD and pulmonary hypertension; states her room air oxygen saturation was 67%, is 85% on 2L.  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  - PCP is Dr. [name]. - Hematologist is Dr. Zola Button, last visit 08/21/16. - Cardiologist is Dr. Peter Martinique, last visit with Rosaria Ferries, PA-C on 07/23/16. He cleared her for surgery from a cardiology standpoint. See 11/06/16 encounter.  - Pulmonologist is Dr. Kara Mead. Last visit with Rexene Edison, NP on 07/24/16. Pulmonology would not provide pre-operative input without first re-evaluating her in the office.  - Vascular surgeon is Dr. Scot Dock. Last visit with Clemon Chambers, NP on 10/02/16.   Meds include aspirin 81 mg, Lipitor, Prolia, folic acid, Lasix, Imdur, levothyroxine, metoprolol, nitroglycerin, fish oil, Stiolto Respimat.  EKG 07/23/16: SB at 59 bpm, sinus arrhythmia, first degree AV block. Poor r wave progression.   Echo 12/29/15: Study Conclusions - Left ventricle: The cavity size was normal. Wall thickness was   normal. Systolic function was normal. The estimated ejection   fraction was in the range of 60% to 65%. Features are consistent   with a pseudonormal left ventricular filling pattern, with   concomitant abnormal relaxation and increased  filling pressure   (grade 2 diastolic dysfunction). Doppler parameters are   consistent with high ventricular filling pressure. - Mitral valve: Calcified annulus. Mildly thickened leaflets .   Valve area by continuity equation (using LVOT flow): 1.83 cm^2. - Pulmonary arteries: PA peak pressure: 64 mm Hg (S).  12/29/15 48 Hour Holter Monitor (done for palpitations):  Normal sinus rhythm  occasional PVC isolated  occasional PAC isolated  4 beat run of idioventricular rhythm  no concerning arrhythmia. (B-blocker increased.)  Nuclear stress test 10/27/14: Overall Impression:  Low risk stress nuclear study with mild distal anteroapical attenuation artifact. No reversible ischemia. LV Wall Motion:  NL LV Function; NL Wall Motion; LVEF 76%.  Carotid Duplex 10/02/16: Patent right ICA with history of carotid endarterectomy, no hyperplasia or hemodynamically significant plaque, and minimal left ICA stenosis (< 40%). Retrograde left vertebral artery and stenotic left subclavian artery.Antegrade right vertebral artery with normal waveforms. Essentially unchanged since previous study on 09/27/15.  Myriam Jacobson from Dr. Biagio Borg office called and ask that anesthesiologist review chart to determine if anything else is needed prior to surgery. Discussed with anesthesiologist Dr. Linna Caprice. Patient has cardiac clearance, but does have COPD, pulmonary hypertension and uses home oxygen. Dr. Grandville Silos thinks case can be done with MAC and regional block. If patient feels at her baseline without acute pulmonary issues then it is anticipated that she can proceed as planned. Anticipated MAC with axillary block. Anesthesiologist to evaluate on the day of surgery to ensure no acute changes prior to proceeding.  George Hugh [facility] Short Stay Center/Anesthesiology Phone [phone] 11/06/2016 4:51 PM

## 2016-11-06 NOTE — Telephone Encounter (Signed)
New message       Request for surgical clearance:  1. What type of surgery is being performed? Left broken wrist repair  2. When is this surgery scheduled?  11-08-16----this friday  Are there any medications that need to be held prior to surgery and how long? Need cardiac clearance Name of physician performing surgery? Dr Milly Jakob 3. What is your office phone and fax number?  Fax [phone]

## 2016-11-06 NOTE — Telephone Encounter (Signed)
Note faxed to [facility] Grandville Silos at fax # [phone].

## 2016-11-06 NOTE — Telephone Encounter (Signed)
LMTCB for [employer]

## 2016-11-06 NOTE — Telephone Encounter (Signed)
lmtcb x1 for [employer].

## 2016-11-07 NOTE — H&P (Signed)
[name] is an 82 y.o. female.   CC / Reason for Visit: Left wrist injury HPI: This patient is an 82 year old RHD female with significant COPD, requiring baseline 02, who presents for evaluation of a left wrist injury that occurred when she fell onto an outstretched hand today.  She has become swollen with pain and presents for further evaluation.  She has not had any operative intervention since she has gone on baseline oxygen.  Her cardiologist is Dr. Martinique, pulmonologist Dr. Elsworth Soho.  Past Medical History:  Diagnosis Date  . Arthritis   . B12 deficiency   . Basal cell cancer    Nose  . Carotid artery occlusion   . Cataract   . Chronic diastolic CHF (congestive heart failure) ([place])   . Colon polyps   . COPD (chronic obstructive pulmonary disease) ([place])    STAGE 2  . Coronary artery disease    status post angioplasty of the mid RCA in 1995  . Dyspnea   . First degree atrioventricular block   . History of obesity   . Hyperlipidemia   . Hypertension    well controlled  . Hyperthyroidism   . Lichen sclerosus et atrophicus of the vulva   . Osteopenia   . PAD (peripheral artery disease) ([facility])    with stable claudication    Past Surgical History:  Procedure Laterality Date  . CAROTID ENDARTERECTOMY Right 2002  . CATARACT EXTRACTION W/ INTRAOCULAR LENS  IMPLANT, BILATERAL    . CHOLECYSTECTOMY  1999  . ENDARTERECTOMY     right carotid endarterectomy  . KNEE ARTHROSCOPY Left   . MENISCUS REPAIR Left 10/29/2012  . REPAIR TENDONS FOOT      Family History  Problem Relation Age of Onset  . Heart attack Father 15  . Hypertension Father   . Heart disease Brother     underwent coronary bypass grafting at age 48  . Hypertension Brother   . Diabetes Sister    Social History:  reports that she quit smoking about 22 years ago. Her smoking use included Cigarettes. She has a 40.00 pack-year smoking history. She has never used smokeless tobacco. She reports that she does not drink  alcohol or use drugs.  Allergies:  Allergies  Allergen Reactions  . Tramadol     Lightheadedness      No prescriptions prior to admission.    No results found for this or any previous visit (from the past 48 hour(s)). No results found.  Review of Systems  All other systems reviewed and are negative.   There were no vitals taken for this visit. Physical Exam  Constitutional:  WD, WN, NAD HEENT:  NCAT, EOMI Neuro/Psych:  Alert & oriented to person, place, and time; appropriate mood & affect Lymphatic: No generalized UE edema or lymphadenopathy Extremities / MSK:  Both UE are normal with respect to appearance, ranges of motion, joint stability, muscle strength/tone, sensation, & perfusion except as otherwise noted:  The left wrist is swollen, slightly dorsally displaced.  It is tender about the distal radius.  The digits are not significantly swollen and she has intact light touch sensibility in the radial, median, and ulnar nerve distributions and intact motor to the same.  No tenderness about the elbow or proximally.  Radial pulses palpable, Refill brisk, forearm soft  Labs / Xrays:  3 views of the left wrist ordered and obtained today reveals a comminuted intra-articular distal radius fracture, with overall 20 or so degrees of dorsal tilt, 50%  dorsal translation, and intra-articular incongruity  Assessment: Comminuted displaced left intra-articular distal radius fracture  Plan:  I discussed today's findings with her and reviewed the indications for operative treatment.  Plastic models were used.  A sugar tong splint was applied.  We will seek appropriate input from her cardiologist and pulmonologist and seek to engage in reconstructive surgery at the end of the week, hopefully on Friday.  It may require surgery in the main hospital given her baseline comorbidities.  Hopefully, it could be done with a regional block and MAC. The details of the operative procedure were discussed  with the patient.  Questions were invited and answered.  In addition to the goal of the procedure, the risks of the procedure to include but not limited to bleeding; infection; damage to the nerves or blood vessels that could result in bleeding, numbness, weakness, chronic pain, and the need for additional procedures; stiffness; the need for revision surgery; and anesthetic risks were reviewed.  No specific outcome was guaranteed or implied.  Informed consent was obtained.  Zyrion Coey A., MD 11/07/2016, 4:06 PM

## 2016-11-08 ENCOUNTER — Ambulatory Visit (HOSPITAL_COMMUNITY)
Admission: RE | Admit: 2016-11-08 | Discharge: 2016-11-08 | Disposition: A | Discharge status: Home / Self Care | Payer: Medicare Other | Source: Ambulatory Visit | Attending: Orthopedic Surgery | Admitting: Orthopedic Surgery

## 2016-11-08 ENCOUNTER — Encounter (HOSPITAL_COMMUNITY): Payer: Self-pay | Admitting: Surgery

## 2016-11-08 ENCOUNTER — Ambulatory Visit (HOSPITAL_COMMUNITY): Payer: Medicare Other

## 2016-11-08 ENCOUNTER — Ambulatory Visit (HOSPITAL_COMMUNITY): Payer: Medicare Other | Admitting: Vascular Surgery

## 2016-11-08 ENCOUNTER — Encounter (HOSPITAL_COMMUNITY)
Admission: RE | Disposition: A | Discharge status: Home / Self Care | Payer: Self-pay | Source: Ambulatory Visit | Attending: Orthopedic Surgery

## 2016-11-08 DIAGNOSIS — W19XXXA Unspecified fall, initial encounter: Secondary | ICD-10-CM | POA: Diagnosis not present

## 2016-11-08 DIAGNOSIS — Z79899 Other long term (current) drug therapy: Secondary | ICD-10-CM | POA: Diagnosis not present

## 2016-11-08 DIAGNOSIS — Z87891 Personal history of nicotine dependence: Secondary | ICD-10-CM | POA: Diagnosis not present

## 2016-11-08 DIAGNOSIS — Z85828 Personal history of other malignant neoplasm of skin: Secondary | ICD-10-CM | POA: Diagnosis not present

## 2016-11-08 DIAGNOSIS — S52572A Other intraarticular fracture of lower end of left radius, initial encounter for closed fracture: Secondary | ICD-10-CM | POA: Diagnosis present

## 2016-11-08 DIAGNOSIS — Z9981 Dependence on supplemental oxygen: Secondary | ICD-10-CM | POA: Insufficient documentation

## 2016-11-08 DIAGNOSIS — J449 Chronic obstructive pulmonary disease, unspecified: Secondary | ICD-10-CM | POA: Diagnosis not present

## 2016-11-08 DIAGNOSIS — E0590 Thyrotoxicosis, unspecified without thyrotoxic crisis or storm: Secondary | ICD-10-CM | POA: Diagnosis not present

## 2016-11-08 DIAGNOSIS — I5032 Chronic diastolic (congestive) heart failure: Secondary | ICD-10-CM | POA: Insufficient documentation

## 2016-11-08 DIAGNOSIS — S52592A Other fractures of lower end of left radius, initial encounter for closed fracture: Secondary | ICD-10-CM | POA: Diagnosis not present

## 2016-11-08 DIAGNOSIS — Z955 Presence of coronary angioplasty implant and graft: Secondary | ICD-10-CM | POA: Diagnosis not present

## 2016-11-08 DIAGNOSIS — I2510 Atherosclerotic heart disease of native coronary artery without angina pectoris: Secondary | ICD-10-CM | POA: Insufficient documentation

## 2016-11-08 DIAGNOSIS — I110 Hypertensive heart disease with heart failure: Secondary | ICD-10-CM | POA: Diagnosis not present

## 2016-11-08 DIAGNOSIS — Z419 Encounter for procedure for purposes other than remedying health state, unspecified: Secondary | ICD-10-CM

## 2016-11-08 DIAGNOSIS — E785 Hyperlipidemia, unspecified: Secondary | ICD-10-CM | POA: Diagnosis not present

## 2016-11-08 DIAGNOSIS — I739 Peripheral vascular disease, unspecified: Secondary | ICD-10-CM | POA: Diagnosis not present

## 2016-11-08 DIAGNOSIS — S52502A Unspecified fracture of the lower end of left radius, initial encounter for closed fracture: Secondary | ICD-10-CM | POA: Diagnosis not present

## 2016-11-08 HISTORY — DX: Unspecified osteoarthritis, unspecified site: M19.90

## 2016-11-08 HISTORY — PX: OPEN REDUCTION INTERNAL FIXATION (ORIF) DISTAL RADIAL FRACTURE: SHX5989

## 2016-11-08 HISTORY — DX: Dyspnea, unspecified: R06.00

## 2016-11-08 LAB — BASIC METABOLIC PANEL
Anion gap: 6 (ref 5–15)
BUN: 30 mg/dL — ABNORMAL HIGH (ref 6–20)
CALCIUM: 8.8 mg/dL — AB (ref 8.9–10.3)
CHLORIDE: 104 mmol/L (ref 101–111)
CO2: 27 mmol/L (ref 22–32)
Creatinine, Ser: 1.21 mg/dL — ABNORMAL HIGH (ref 0.44–1.00)
GFR, EST AFRICAN AMERICAN: 46 mL/min — AB (ref >60)
GFR, EST NON AFRICAN AMERICAN: 40 mL/min — AB (ref >60)
Glucose, Bld: 108 mg/dL — ABNORMAL HIGH (ref 65–99)
Potassium: 4 mmol/L (ref 3.5–5.1)
SODIUM: 137 mmol/L (ref 135–145)

## 2016-11-08 LAB — CBC
HCT: 43.5 % (ref 36.0–46.0)
Hemoglobin: 14.4 g/dL (ref 12.0–15.0)
MCH: 28.3 pg (ref 26.0–34.0)
MCHC: 33.1 g/dL (ref 30.0–36.0)
MCV: 85.6 fL (ref 78.0–100.0)
PLATELETS: 133 10*3/uL — AB (ref 150–400)
RBC: 5.08 MIL/uL (ref 3.87–5.11)
RDW: 16.8 % — ABNORMAL HIGH (ref 11.5–15.5)
WBC: 9 10*3/uL (ref 4.0–10.5)

## 2016-11-08 SURGERY — OPEN REDUCTION INTERNAL FIXATION (ORIF) DISTAL RADIUS FRACTURE
Anesthesia: Monitor Anesthesia Care | Site: Wrist | Laterality: Left

## 2016-11-08 MED ORDER — CEFAZOLIN SODIUM-DEXTROSE 2-4 GM/100ML-% IV SOLN
INTRAVENOUS | Status: AC
  Filled 2016-11-08: qty 100

## 2016-11-08 MED ORDER — FENTANYL CITRATE (PF) 100 MCG/2ML IJ SOLN
INTRAMUSCULAR | Status: AC
  Filled 2016-11-08: qty 2

## 2016-11-08 MED ORDER — PHENYLEPHRINE 40 MCG/ML (10ML) SYRINGE FOR IV PUSH (FOR BLOOD PRESSURE SUPPORT)
PREFILLED_SYRINGE | INTRAVENOUS | Status: AC
  Filled 2016-11-08: qty 10

## 2016-11-08 MED ORDER — OXYCODONE HCL 5 MG PO TABS: 5.0000 mg | ORAL_TABLET | Freq: Four times a day (QID) | ORAL | 0 refills | Status: DC | PRN

## 2016-11-08 MED ORDER — IBUPROFEN 200 MG PO TABS: 600.0000 mg | ORAL_TABLET | Freq: Four times a day (QID) | ORAL | 0 refills | Status: DC | PRN

## 2016-11-08 MED ORDER — 0.9 % SODIUM CHLORIDE (POUR BTL) OPTIME
TOPICAL | Status: DC | PRN
  Administered 2016-11-08: 1000 mL

## 2016-11-08 MED ORDER — PROPOFOL 10 MG/ML IV BOLUS
INTRAVENOUS | Status: AC
  Filled 2016-11-08: qty 20

## 2016-11-08 MED ORDER — LACTATED RINGERS IV SOLN: INTRAVENOUS | Status: DC

## 2016-11-08 MED ORDER — PROPOFOL 10 MG/ML IV BOLUS
INTRAVENOUS | Status: DC | PRN
  Administered 2016-11-08 (×3): 20 mg via INTRAVENOUS

## 2016-11-08 MED ORDER — PROPOFOL 500 MG/50ML IV EMUL
INTRAVENOUS | Status: DC | PRN
  Administered 2016-11-08: 08:00:00 via INTRAVENOUS
  Administered 2016-11-08: 25 ug/kg/min via INTRAVENOUS

## 2016-11-08 MED ORDER — LACTATED RINGERS IV SOLN
INTRAVENOUS | Status: DC | PRN
  Administered 2016-11-08: 07:00:00 via INTRAVENOUS

## 2016-11-08 MED ORDER — SODIUM BICARBONATE 4 % IV SOLN
INTRAVENOUS | Status: AC
  Filled 2016-11-08: qty 5

## 2016-11-08 MED ORDER — BUPIVACAINE HCL (PF) 0.25 % IJ SOLN
INTRAMUSCULAR | Status: AC
  Filled 2016-11-08: qty 30

## 2016-11-08 MED ORDER — LIDOCAINE HCL (PF) 1 % IJ SOLN
INTRAMUSCULAR | Status: AC
  Filled 2016-11-08: qty 30

## 2016-11-08 MED ORDER — PHENYLEPHRINE HCL 10 MG/ML IJ SOLN
INTRAMUSCULAR | Status: DC | PRN
  Administered 2016-11-08: 40 ug via INTRAVENOUS
  Administered 2016-11-08: 120 ug via INTRAVENOUS
  Administered 2016-11-08: 80 ug via INTRAVENOUS

## 2016-11-08 MED ORDER — CEFAZOLIN SODIUM-DEXTROSE 2-4 GM/100ML-% IV SOLN
2.0000 g | INTRAVENOUS | Status: AC
  Administered 2016-11-08: 2 g via INTRAVENOUS

## 2016-11-08 MED ORDER — FENTANYL CITRATE (PF) 100 MCG/2ML IJ SOLN
INTRAMUSCULAR | Status: DC | PRN
  Administered 2016-11-08 (×2): 25 ug via INTRAVENOUS

## 2016-11-08 SURGICAL SUPPLY — 59 items
BANDAGE COBAN STERILE 2 (GAUZE/BANDAGES/DRESSINGS) IMPLANT
BIT DRILL 2 FAST STEP (BIT) ×3 IMPLANT
BIT DRILL 2.5X4 QC (BIT) ×3 IMPLANT
BLADE SURG 15 STRL LF DISP TIS (BLADE) ×1 IMPLANT
BLADE SURG 15 STRL SS (BLADE) ×2
BNDG COHESIVE 4X5 TAN STRL (GAUZE/BANDAGES/DRESSINGS) ×3 IMPLANT
BNDG ESMARK 4X9 LF (GAUZE/BANDAGES/DRESSINGS) ×3 IMPLANT
BNDG GAUZE ELAST 4 BULKY (GAUZE/BANDAGES/DRESSINGS) ×3 IMPLANT
BRUSH SCRUB EZ PLAIN DRY (MISCELLANEOUS) IMPLANT
CANISTER SUCTION 2500CC (MISCELLANEOUS) ×3 IMPLANT
CHLORAPREP W/TINT 26ML (MISCELLANEOUS) ×3 IMPLANT
CORDS BIPOLAR (ELECTRODE) ×3 IMPLANT
COVER SURGICAL LIGHT HANDLE (MISCELLANEOUS) ×3 IMPLANT
COVER TABLE BACK 60X90 (DRAPES) ×3 IMPLANT
CUFF TOURNIQUET SINGLE 18IN (TOURNIQUET CUFF) IMPLANT
CUFF TOURNIQUET SINGLE 24IN (TOURNIQUET CUFF) IMPLANT
DRAPE C-ARM 42X72 X-RAY (DRAPES) ×3 IMPLANT
DRAPE SURG 17X23 STRL (DRAPES) ×3 IMPLANT
DRSG ADAPTIC 3X8 NADH LF (GAUZE/BANDAGES/DRESSINGS) ×3 IMPLANT
DRSG EMULSION OIL 3X3 NADH (GAUZE/BANDAGES/DRESSINGS) ×3 IMPLANT
GAUZE SPONGE 4X4 12PLY STRL (GAUZE/BANDAGES/DRESSINGS) ×3 IMPLANT
GLOVE BIO SURGEON STRL SZ7.5 (GLOVE) ×3 IMPLANT
GLOVE BIOGEL PI IND STRL 8 (GLOVE) ×1 IMPLANT
GLOVE BIOGEL PI INDICATOR 8 (GLOVE) ×2
GOWN STRL REUS W/ TWL XL LVL3 (GOWN DISPOSABLE) ×1 IMPLANT
GOWN STRL REUS W/TWL XL LVL3 (GOWN DISPOSABLE) ×2
K-WIRE 1.6 (WIRE) ×2
K-WIRE FX5X1.6XNS BN SS (WIRE) ×1
KIT BASIN OR (CUSTOM PROCEDURE TRAY) ×3 IMPLANT
KWIRE FX5X1.6XNS BN SS (WIRE) ×1 IMPLANT
NEEDLE HYPO 22GX1.5 SAFETY (NEEDLE) ×3 IMPLANT
NEEDLE HYPO 25X1 1.5 SAFETY (NEEDLE) IMPLANT
NS IRRIG 1000ML POUR BTL (IV SOLUTION) ×3 IMPLANT
PACK ORTHO EXTREMITY (CUSTOM PROCEDURE TRAY) ×3 IMPLANT
PAD CAST 4YDX4 CTTN HI CHSV (CAST SUPPLIES) ×1 IMPLANT
PADDING CAST ABS 4INX4YD NS (CAST SUPPLIES)
PADDING CAST ABS COTTON 4X4 ST (CAST SUPPLIES) IMPLANT
PADDING CAST COTTON 4X4 STRL (CAST SUPPLIES) ×2
PEG SUBCHONDRAL SMOOTH 2.0X22 (Peg) ×3 IMPLANT
PEG SUBCHONDRAL SMOOTH 2.0X24 (Peg) ×12 IMPLANT
PEG THREADED 2.5MMX26MM LONG (Peg) ×3 IMPLANT
PENCIL BUTTON HOLSTER BLD 10FT (ELECTRODE) IMPLANT
PLATE SHORT 21.6X48.9 NRRW LT (Plate) ×3 IMPLANT
RUBBERBAND STERILE (MISCELLANEOUS) IMPLANT
SCREW BN 12X3.5XNS CORT TI (Screw) ×2 IMPLANT
SCREW CORT 3.5X12 (Screw) ×4 IMPLANT
SCREW CORT 3.5X14 LNG (Screw) ×3 IMPLANT
SPLINT FIBERGLASS 3X35 (CAST SUPPLIES) ×3 IMPLANT
SPLINT PLASTER EXTRA FAST 3X15 (CAST SUPPLIES) ×2
SPLINT PLASTER GYPS XFAST 3X15 (CAST SUPPLIES) ×1 IMPLANT
SPONGE GAUZE 4X4 12PLY STER LF (GAUZE/BANDAGES/DRESSINGS) ×3 IMPLANT
SUT VIC AB 2-0 CT3 27 (SUTURE) ×3 IMPLANT
SUT VICRYL 4-0 PS2 18IN ABS (SUTURE) IMPLANT
SUT VICRYL RAPIDE 4/0 PS 2 (SUTURE) ×3 IMPLANT
SYRINGE 10CC LL (SYRINGE) IMPLANT
TOWEL OR 17X24 6PK STRL BLUE (TOWEL DISPOSABLE) ×3 IMPLANT
TUBE CONNECTING 12'X1/4 (SUCTIONS) ×1
TUBE CONNECTING 12X1/4 (SUCTIONS) ×2 IMPLANT
UNDERPAD 30X30 (UNDERPADS AND DIAPERS) ×3 IMPLANT

## 2016-11-08 NOTE — Transfer of Care (Signed)
Immediate Anesthesia Transfer of Care Note  Patient: [name]  Procedure(s) Performed: Procedure(s): OPEN TREATMENT OF LEFT DISTAL RADIUS FRACTURE (Left)  Patient Location: PACU  Anesthesia Type:MAC and Regional  Level of Consciousness: awake, alert  and oriented  Airway & Oxygen Therapy: Patient Spontanous Breathing and Patient connected to nasal cannula oxygen  Post-op Assessment: Report given to RN, Post -op Vital signs reviewed and stable and Patient moving all extremities X 4  Post vital signs: Reviewed and stable  Last Vitals:  Vitals:   11/08/16 0624 11/08/16 0849  BP: 138/65   Pulse: 66   Resp: 18   Temp: 36.6 C (P) 36.4 C    Last Pain:  Vitals:   11/08/16 0624  TempSrc: Oral  PainSc:       Patients Stated Pain Goal: 5 ([id number] [phone])  Complications: No apparent anesthesia complications

## 2016-11-08 NOTE — Discharge Instructions (Signed)
Discharge Instructions   You have a dressing with a plaster splint incorporated in it. Move your fingers as much as possible, making a full fist and fully opening the fist. Elevate your hand above your elbow to reduce pain & swelling of the digits.  Ice over the operative site or in the arm pit may be helpful to reduce pain & swelling.  DO NOT USE HEAT. Pain medicine has been prescribed for you.  Use Aleve; 2 tabs in the morning and 2 tabs at night with food or Advil; 3 tabs every 6 hours. Additionally, utilize tylenol over the counter as stated on the bottle. Use the pain medicine with sever breakthrough pain. Leave the dressing in place until you return to our office.  You may shower, but keep the bandage clean & dry.  You may drive a car when you are off of prescription pain medications and can safely control your vehicle with both hands. Our office will call you to arrange follow-up   Please call [phone] during normal business hours or [phone] after hours for any problems. Including the following:  - excessive redness of the incisions - drainage for more than 4 days - fever of more than 101.5 F  *Please note that pain medications will not be refilled after hours or on weekends.

## 2016-11-08 NOTE — Anesthesia Postprocedure Evaluation (Signed)
Anesthesia Post Note  Patient: [name]  Procedure(s) Performed: Procedure(s) (LRB): OPEN TREATMENT OF LEFT DISTAL RADIUS FRACTURE (Left)  Patient location during evaluation: PACU Anesthesia Type: Regional and MAC Level of consciousness: awake, awake and alert and oriented Pain management: pain level controlled Vital Signs Assessment: post-procedure vital signs reviewed and stable Respiratory status: spontaneous breathing, nonlabored ventilation and respiratory function stable Cardiovascular status: blood pressure returned to baseline Anesthetic complications: no       Last Vitals:  Vitals:   11/08/16 0918 11/08/16 0939  BP:  (!) 147/55  Pulse: 71 76  Resp: (!) 21 18  Temp: 36.7 C     Last Pain:  Vitals:   11/08/16 0939  TempSrc:   PainSc: 0-No pain                 Dayanira Giovannetti COKER

## 2016-11-08 NOTE — Anesthesia Preprocedure Evaluation (Addendum)
Anesthesia Evaluation  Patient identified by MRN, date of birth, ID band Patient awake    Reviewed: Allergy & Precautions, H&P , NPO status   Airway Mallampati: II  TM Distance: >3 FB Neck ROM: full    Dental  (+) Edentulous Upper, Edentulous Lower, Upper Dentures, Lower Dentures, Dental Advidsory Given   Pulmonary shortness of breath and with exertion, COPD,  oxygen dependent, former smoker,    breath sounds clear to auscultation       Cardiovascular hypertension, + CAD, + Peripheral Vascular Disease and +CHF  + dysrhythmias  Rhythm:Regular Rate:Normal     Neuro/Psych    GI/Hepatic   Endo/Other  Hyperthyroidism   Renal/GU      Musculoskeletal   Abdominal   Peds  Hematology   Anesthesia Other Findings Desaturates rapidly without oxygen support. SaO2 on room air is 79% - Left ventricle: The cavity size was normal. Wall thickness was   normal. Systolic function was normal. The estimated ejection   fraction was in the range of 60% to 65%. Features are consistent   with a pseudonormal left ventricular filling pattern, with   concomitant abnormal relaxation and increased filling pressure   (grade 2 diastolic dysfunction). Doppler parameters are   consistent with high ventricular filling pressure. - Mitral valve: Calcified annulus. Mildly thickened leaflets .   Valve area by continuity equation (using LVOT flow): 1.83 cm^2. - Pulmonary arteries: PA peak pressure: 64 mm Hg (S).  Reproductive/Obstetrics                           Anesthesia Physical Anesthesia Plan  ASA: III  Anesthesia Plan: Regional and MAC   Post-op Pain Management:    Induction: Intravenous  Airway Management Planned: Natural Airway and Simple Face Mask  Additional Equipment:   Intra-op Plan:   Post-operative Plan:   Informed Consent: I have reviewed the patients History and Physical, chart, labs and discussed the  procedure including the risks, benefits and alternatives for the proposed anesthesia with the patient or authorized representative who has indicated his/her understanding and acceptance.   Dental Advisory Given and Dental advisory given  Plan Discussed with:   Anesthesia Plan Comments:        Anesthesia Quick Evaluation

## 2016-11-08 NOTE — Op Note (Signed)
11/08/2016  7:42 AM  PATIENT:  [name]  82 y.o. female  PRE-OPERATIVE DIAGNOSIS:  Comminuted displaced left intra-articular distal radius fracture  POST-OPERATIVE DIAGNOSIS:  Same  PROCEDURE:  ORIF left comminuted intra-articular distal radius fracture, 25609  SURGEON: Kelly Char. Grandville Silos, MD  PHYSICIAN ASSISTANT: [name], OPA-C  ANESTHESIA:  regional and MAC  SPECIMENS:  None  DRAINS: None  EBL:  less than 50 mL  PREOPERATIVE INDICATIONS:  [name] is a  82 y.o. female with a comminuted, displaced left distal radius fracture  The risks benefits and alternatives were discussed with the patient preoperatively including but not limited to the risks of infection, bleeding, nerve injury, cardiopulmonary complications, the need for revision surgery, among others, and the patient verbalized understanding and consented to proceed.  OPERATIVE IMPLANTS: Biomet DVR plate/screws  OPERATIVE PROCEDURE: After receiving prophylactic antibiotics and a regional block, the patient was escorted to the operative theatre and placed in a supine position.   A surgical "time-out" was performed during which the planned procedure, proposed operative site, and the correct patient identity were compared to the operative consent and agreement confirmed by the circulating nurse according to current facility policy. Following application of a tourniquet to the operative extremity, the exposed skin was pre-scrub with Hibiclens scrub brush and then was prepped with Chloraprep and draped in the usual sterile fashion. The limb was exsanguinated with an Esmarch bandage and the tourniquet inflated to approximately 12mmHg higher than systolic BP.   A sinusoidal-shaped incision was marked and made over the FCR axis and the distal forearm. The skin was incised sharply with scalpel, subcutaneous tissues with blunt and spreading dissection. The FCR axis was exploited deeply. The pronator quadratus was  reflected in an L-shaped ulnarly. The fracture was inspected and provisionally reduced.  This was confirmed fluoroscopically. The appropriately sized plate was selected and found to fit well. It was placed in its provisional alignment of the radius and this was confirmed fluoroscopically.  It was secured to the radius with a screw through the slotted hole.  Additional adjustments were made as necessary, and the distal holes were all drilled and filled.  Peg/screw length distally was selected on the shorter side of measurements to minimize the risk for dorsal cortical penetration. The remainder of the proximal holes were drilled and filled.   Final images were obtained and the DRUJ was examined for stability. It was found to be sufficiently stable. The wound was then copiously irrigated and the pronator repaired with 2-0 Vicryl suture. Tourniquet was released and additional hemostasis obtained and the skin was closed with 2-0 Vicryl deep dermal buried sutures followed by running 4-0 Vicryl Rapide horizontal mattress suture in the skin. A bulky dressing with a volar plaster component was applied and she was taken to room stable condition.  DISPOSITION: The patient will be discharged home today with typical post-op instructions, returning in 10-15 days for reevaluation with new x-rays of the affected wrist out of the splint to include an inclined lateral and then transition to therapy to have a custom splint constructed and begin rehabilitation.

## 2016-11-08 NOTE — Interval H&P Note (Signed)
History and Physical Interval Note:  11/08/2016 7:42 AM  [name]  has presented today for surgery, with the diagnosis of LEFT DISTAL RADIUS FRACTURE S52.572A  The various methods of treatment have been discussed with the patient and family. After consideration of risks, benefits and other options for treatment, the patient has consented to  Procedure(s): OPEN TREATMENT OF LEFT DISTAL RADIUS FRACTURE (Left) as a surgical intervention .  The patient's history has been reviewed, patient examined, no change in status, stable for surgery.  I have reviewed the patient's chart and labs.  Questions were answered to the patient's satisfaction.     Federick Levene A.

## 2016-11-08 NOTE — Anesthesia Procedure Notes (Signed)
Anesthesia Regional Block:  Axillary brachial plexus block  Pre-Anesthetic Checklist: ,, timeout performed, Correct Patient, Correct Site, Correct Laterality, Correct Procedure, Correct Position, site marked, Risks and benefits discussed,  Surgical consent,  Pre-op evaluation,  At surgeon's request and post-op pain management  Laterality: Left     Needles:   Needle Type: Stimulator Needle - 80          Additional Needles:  Procedures: transarterial technique Axillary brachial plexus block Narrative:  Start time: 11/08/2016 7:10 AM End time: 11/08/2016 7:15 AM Injection made incrementally with aspirations every 5 mL.  Performed by: Personally   Additional Notes: 50 cc 1.5% lidocaine with 1:200 Epi injected easiily

## 2016-11-08 NOTE — Anesthesia Procedure Notes (Signed)
Procedure Name: MAC Date/Time: 11/08/2016 7:41 AM Performed by: Neldon Newport Pre-anesthesia Checklist: Timeout performed, Patient being monitored, Suction available, Emergency Drugs available and Patient identified Oxygen Delivery Method: Simple face mask

## 2016-11-12 ENCOUNTER — Encounter (HOSPITAL_COMMUNITY): Payer: Self-pay | Admitting: Orthopedic Surgery

## 2016-11-12 NOTE — Telephone Encounter (Signed)
Per pt's note, pt has already had the sx that this message is regarding.  Will close encounter.

## 2016-11-20 DIAGNOSIS — S52531D Colles' fracture of right radius, subsequent encounter for closed fracture with routine healing: Secondary | ICD-10-CM | POA: Diagnosis not present

## 2016-11-20 DIAGNOSIS — S52572D Other intraarticular fracture of lower end of left radius, subsequent encounter for closed fracture with routine healing: Secondary | ICD-10-CM | POA: Diagnosis not present

## 2016-11-20 DIAGNOSIS — M545 Low back pain: Secondary | ICD-10-CM | POA: Diagnosis not present

## 2016-11-20 DIAGNOSIS — M25531 Pain in right wrist: Secondary | ICD-10-CM | POA: Diagnosis not present

## 2016-11-21 ENCOUNTER — Ambulatory Visit (HOSPITAL_BASED_OUTPATIENT_CLINIC_OR_DEPARTMENT_OTHER): Payer: Medicare Other | Admitting: Oncology

## 2016-11-21 ENCOUNTER — Other Ambulatory Visit (HOSPITAL_BASED_OUTPATIENT_CLINIC_OR_DEPARTMENT_OTHER): Payer: Medicare Other

## 2016-11-21 VITALS — BP 160/52 | HR 74 | Temp 97.3°F | Resp 18 | Ht 64.0 in | Wt 169.5 lb

## 2016-11-21 DIAGNOSIS — D751 Secondary polycythemia: Secondary | ICD-10-CM | POA: Diagnosis not present

## 2016-11-21 DIAGNOSIS — R0902 Hypoxemia: Secondary | ICD-10-CM | POA: Diagnosis not present

## 2016-11-21 DIAGNOSIS — J449 Chronic obstructive pulmonary disease, unspecified: Secondary | ICD-10-CM

## 2016-11-21 LAB — CBC WITH DIFFERENTIAL/PLATELET
BASO%: 0.5 % (ref 0.0–2.0)
Basophils Absolute: 0.1 10*3/uL (ref 0.0–0.1)
EOS%: 0.1 % (ref 0.0–7.0)
Eosinophils Absolute: 0 10*3/uL (ref 0.0–0.5)
HCT: 41.6 % (ref 34.8–46.6)
HEMOGLOBIN: 13.8 g/dL (ref 11.6–15.9)
LYMPH%: 9.5 % — AB (ref 14.0–49.7)
MCH: 28.7 pg (ref 25.1–34.0)
MCHC: 33.1 g/dL (ref 31.5–36.0)
MCV: 86.6 fL (ref 79.5–101.0)
MONO#: 0.7 10*3/uL (ref 0.1–0.9)
MONO%: 7 % (ref 0.0–14.0)
NEUT%: 82.9 % — ABNORMAL HIGH (ref 38.4–76.8)
NEUTROS ABS: 8.7 10*3/uL — AB (ref 1.5–6.5)
PLATELETS: 217 10*3/uL (ref 145–400)
RBC: 4.81 10*6/uL (ref 3.70–5.45)
RDW: 14.5 % (ref 11.2–14.5)
WBC: 10.6 10*3/uL — AB (ref 3.9–10.3)
lymph#: 1 10*3/uL (ref 0.9–3.3)

## 2016-11-21 NOTE — Progress Notes (Signed)
Hematology and Oncology Follow Up Visit  [name] [id number] October 01, 1929 83 y.o. 11/21/2016 9:41 AM [name], MDPerini, Elta Guadeloupe, MD   Principle Diagnosis: 83 year old woman without polycythemia due to secondary causes including chronic hypoxia and COPD. This was diagnosed in July of 2017 after presenting with a hemoglobin of 19.5.   Prior Therapy: She is status post phlebotomy done on 07/05/2016.  Current therapy: Aspirin 81 mg daily as well as oxygen supplementation.  Interim History: [name] presents today for a follow-up visit. Since the last visit, she sustained a fall in December 2017 and had a comminuted fracture of the distal radius and underwent open reduction internal fixation of that fracture on 11/08/2016. At that time, her hemoglobin was checked and was noted to be 14.4. She recovered well from her operation and continues to use oxygen. She denied any shortness of breath, cough or wheezing.   She denied any thrombosis or bleeding episodes. She has not reported any angina or dizziness. She denied any syncope. Her fall was related to tripping on her oxygen tubing.  She does not report any headaches, seizures or syncope. She does not report any fevers, chills, sweats or weight loss. She does not report any chest pain, palpitation, orthopnea or leg edema. She does not report any cough, wheezing or hemoptysis. She does not report any nausea, vomiting, abdominal pain, although satiety or abdominal distention. She does not report any constipation or diarrhea. She does not report any frequency urgency or hesitancy. She does not report any skeletal complaints. Remaining review of systems unremarkable.   Medications: I have reviewed the patient's current medications.  Current Outpatient Prescriptions  Medication Sig Dispense Refill  . acetaminophen (TYLENOL) 500 MG tablet Take 1,000 mg by mouth every 6 (six) hours as needed for moderate pain.    . Aloe-Sodium Chloride (AYR SALINE  NASAL GEL NA) Place 1 application into the nose daily as needed (dryness).    Marland Kitchen aspirin 81 MG tablet Take 81 mg by mouth daily.      Marland Kitchen atorvastatin (LIPITOR) 40 MG tablet Take 20 mg by mouth every evening.    . Carboxymethylcellul-Glycerin (LUBRICATING EYE DROPS OP) Apply 1 drop to eye daily as needed (dry eyes).    . Cholecalciferol (VITAMIN D3) 2000 units capsule Take 2,000 Units by mouth daily.    . clobetasol cream (TEMOVATE) 0.05 % Apply externally twice a week 30 g 5  . Coenzyme Q10 (CO Q 10) 100 MG CAPS Take 100 mg by mouth daily.    . Cyanocobalamin (B-12) 500 MCG TABS Take 500 mcg by mouth daily.    Marland Kitchen denosumab (PROLIA) 60 MG/ML SOLN injection Inject 60 mg into the skin every 6 (six) months. Administer in upper arm, thigh, or abdomen    . folic acid (FOLVITE) A999333 MCG tablet Take 400 mcg by mouth daily.    . furosemide (LASIX) 20 MG tablet TAKE 1 TABLET(20 MG) BY MOUTH DAILY (Patient taking differently: TAKE 1 TABLET(20 MG) BY MOUTH DAILY IN THE AFTERNOON) 90 tablet 1  . ibuprofen (ADVIL) 200 MG tablet Take 3 tablets (600 mg total) by mouth every 6 (six) hours as needed for moderate pain.  0  . isosorbide mononitrate (IMDUR) 30 MG 24 hr tablet Take 1 tablet (30 mg total) by mouth daily. 90 tablet 3  . levothyroxine (SYNTHROID, LEVOTHROID) 137 MCG tablet Take 137 mcg by mouth daily.      . metoprolol (LOPRESSOR) 50 MG tablet Take 50 mg by mouth 2 (two)  times daily.    . Multiple Vitamin (MULTIVITAMIN WITH MINERALS) TABS tablet Take 1 tablet by mouth daily.    . multivitamin-lutein (OCUVITE-LUTEIN) CAPS capsule Take 1 capsule by mouth daily.    . nitroGLYCERIN (NITROSTAT) 0.4 MG SL tablet Place 0.4 mg under the tongue every 5 (five) minutes as needed for chest pain (x 3 pills).    . NONFORMULARY OR COMPOUNDED ITEM Estradiol 0000000 mg [id number] applicators s: insert vaginally twice weekly. 24 each 3  . Omega-3 Fatty Acids (FISH OIL) 1200 MG CAPS Take 1,200 mg by mouth 2 (two) times daily.    Marland Kitchen  oxyCODONE (ROXICODONE) 5 MG immediate release tablet Take 1 tablet (5 mg total) by mouth every 6 (six) hours as needed for breakthrough pain. 30 tablet 0  . STIOLTO RESPIMAT 2.5-2.5 MCG/ACT AERS Inhale 2 puffs daily in the morning  5  . VITAMIN E PO Take 500 mg by mouth daily.     No current facility-administered medications for this visit.      Allergies: No Active Allergies  Past Medical History, Surgical history, Social history, and Family History were reviewed and updated.  Physical Exam: Blood pressure (!) 160/52, pulse 74, temperature 97.3 F (36.3 C), temperature source Oral, resp. rate 18, height 5\' 4"  (1.626 m), weight 169 lb 8 oz (76.9 kg), SpO2 (!) 83 %. ECOG: 1 General appearance: alert and cooperative appeared without distress. Head: Normocephalic, without obvious abnormality no oral ulcers or lesions. Neck: no adenopathy Lymph nodes: Cervical, supraclavicular, and axillary nodes normal. Heart:regular rate and rhythm, S1, S2 normal, no murmur, click, rub or gallop Lung:chest clear, no wheezing, rales, normal symmetric air entry Abdomin: soft, non-tender, without masses or organomegaly EXT:no erythema, induration, or nodules   Lab Results: Lab Results  Component Value Date   WBC 10.6 (H) 11/21/2016   HGB 13.8 11/21/2016   HCT 41.6 11/21/2016   MCV 86.6 11/21/2016   PLT 217 11/21/2016     Chemistry      Component Value Date/Time   NA 137 11/08/2016 0622   NA 139 07/05/2016 1152   K 4.0 11/08/2016 0622   K 4.5 07/05/2016 1152   CL 104 11/08/2016 0622   CO2 27 11/08/2016 0622   CO2 24 07/05/2016 1152   BUN 30 (H) 11/08/2016 0622   BUN 22.4 07/05/2016 1152   CREATININE 1.21 (H) 11/08/2016 0622   CREATININE 1.0 07/05/2016 1152      Component Value Date/Time   CALCIUM 8.8 (L) 11/08/2016 0622   CALCIUM 10.0 07/05/2016 1152   ALKPHOS 60 07/05/2016 1152   AST 27 07/05/2016 1152   ALT 15 07/05/2016 1152   BILITOT 0.98 07/05/2016 1152       Impression and  Plan:  83 year old woman with the following issues:  1. Polycythemia that has been evolving for years and documented in July 2017. The etiology of her polycythemia is related to chronic hypoxia and lung disease. Polycythemia vera workup has been unrevealing with negative JAK2 mutation. She received one phlebotomy and her hemoglobin improved within normal range at this time.  Her hemoglobin was repeated today and continues to show normal range without phlebotomy. This indicates that her polycythemia secondary to chronic hypoxia which has resolved at this time. I see no further need for phlebotomy for hematological evaluation. I recommend continuing oxygen indefinitely at this time.  2. Thrombosis prophylaxis: She will continue low-dose aspirin which is recommended at this time.  3. Follow-up: Will be as needed in the future.  [facility], MD 1/4/20189:41 AM

## 2016-11-22 ENCOUNTER — Other Ambulatory Visit (HOSPITAL_COMMUNITY): Payer: Self-pay | Admitting: Orthopedic Surgery

## 2016-11-22 DIAGNOSIS — M5440 Lumbago with sciatica, unspecified side: Secondary | ICD-10-CM

## 2016-11-28 ENCOUNTER — Ambulatory Visit (HOSPITAL_COMMUNITY)
Admission: RE | Admit: 2016-11-28 | Discharge: 2016-11-28 | Disposition: A | Discharge status: Home / Self Care | Payer: Medicare Other | Source: Ambulatory Visit | Attending: Orthopedic Surgery | Admitting: Orthopedic Surgery

## 2016-11-28 ENCOUNTER — Encounter (HOSPITAL_COMMUNITY): Payer: Self-pay | Admitting: Radiology

## 2016-11-28 DIAGNOSIS — M47896 Other spondylosis, lumbar region: Secondary | ICD-10-CM | POA: Insufficient documentation

## 2016-11-28 DIAGNOSIS — M438X6 Other specified deforming dorsopathies, lumbar region: Secondary | ICD-10-CM | POA: Insufficient documentation

## 2016-11-28 DIAGNOSIS — M545 Low back pain: Secondary | ICD-10-CM | POA: Diagnosis present

## 2016-11-28 DIAGNOSIS — M5440 Lumbago with sciatica, unspecified side: Secondary | ICD-10-CM

## 2016-11-28 DIAGNOSIS — M5126 Other intervertebral disc displacement, lumbar region: Secondary | ICD-10-CM | POA: Diagnosis not present

## 2016-11-29 DIAGNOSIS — M25532 Pain in left wrist: Secondary | ICD-10-CM | POA: Diagnosis not present

## 2016-11-29 DIAGNOSIS — S52532D Colles' fracture of left radius, subsequent encounter for closed fracture with routine healing: Secondary | ICD-10-CM | POA: Diagnosis not present

## 2016-12-03 DIAGNOSIS — M25532 Pain in left wrist: Secondary | ICD-10-CM | POA: Diagnosis not present

## 2016-12-03 DIAGNOSIS — S52532D Colles' fracture of left radius, subsequent encounter for closed fracture with routine healing: Secondary | ICD-10-CM | POA: Diagnosis not present

## 2016-12-04 ENCOUNTER — Encounter: Payer: Medicare Other | Admitting: Gynecology

## 2016-12-09 DIAGNOSIS — S52532D Colles' fracture of left radius, subsequent encounter for closed fracture with routine healing: Secondary | ICD-10-CM | POA: Diagnosis not present

## 2016-12-09 DIAGNOSIS — L821 Other seborrheic keratosis: Secondary | ICD-10-CM | POA: Diagnosis not present

## 2016-12-09 DIAGNOSIS — Z85828 Personal history of other malignant neoplasm of skin: Secondary | ICD-10-CM | POA: Diagnosis not present

## 2016-12-09 DIAGNOSIS — L570 Actinic keratosis: Secondary | ICD-10-CM | POA: Diagnosis not present

## 2016-12-09 DIAGNOSIS — M25532 Pain in left wrist: Secondary | ICD-10-CM | POA: Diagnosis not present

## 2016-12-09 DIAGNOSIS — L905 Scar conditions and fibrosis of skin: Secondary | ICD-10-CM | POA: Diagnosis not present

## 2016-12-09 NOTE — Progress Notes (Signed)
This encounter was created in error - please disregard.

## 2016-12-12 DIAGNOSIS — S52532D Colles' fracture of left radius, subsequent encounter for closed fracture with routine healing: Secondary | ICD-10-CM | POA: Diagnosis not present

## 2016-12-12 DIAGNOSIS — M25532 Pain in left wrist: Secondary | ICD-10-CM | POA: Diagnosis not present

## 2016-12-16 DIAGNOSIS — S52532D Colles' fracture of left radius, subsequent encounter for closed fracture with routine healing: Secondary | ICD-10-CM | POA: Diagnosis not present

## 2016-12-16 DIAGNOSIS — M25532 Pain in left wrist: Secondary | ICD-10-CM | POA: Diagnosis not present

## 2016-12-18 ENCOUNTER — Ambulatory Visit (INDEPENDENT_AMBULATORY_CARE_PROVIDER_SITE_OTHER): Payer: Medicare Other | Admitting: Pulmonary Disease

## 2016-12-18 ENCOUNTER — Encounter: Payer: Self-pay | Admitting: Pulmonary Disease

## 2016-12-18 DIAGNOSIS — I272 Pulmonary hypertension, unspecified: Secondary | ICD-10-CM | POA: Diagnosis not present

## 2016-12-18 DIAGNOSIS — J449 Chronic obstructive pulmonary disease, unspecified: Secondary | ICD-10-CM | POA: Diagnosis not present

## 2016-12-18 DIAGNOSIS — J9611 Chronic respiratory failure with hypoxia: Secondary | ICD-10-CM | POA: Diagnosis not present

## 2016-12-18 MED ORDER — UMECLIDINIUM-VILANTEROL 62.5-25 MCG/INH IN AEPB: 1.0000 | INHALATION_SPRAY | Freq: Every day | RESPIRATORY_TRACT | 4 refills | Status: DC

## 2016-12-18 MED ORDER — UMECLIDINIUM-VILANTEROL 62.5-25 MCG/INH IN AEPB: 1.0000 | INHALATION_SPRAY | Freq: Every day | RESPIRATORY_TRACT | 0 refills | Status: DC

## 2016-12-18 NOTE — Addendum Note (Signed)
Addended by: Valerie Salts on: 12/18/2016 10:22 AM   Modules accepted: Orders

## 2016-12-18 NOTE — Assessment & Plan Note (Signed)
Stay on Lasix

## 2016-12-18 NOTE — Progress Notes (Signed)
Subjective:    Patient ID: [name], female    DOB: [birth year], 83 y.o.   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    her dyspnea is out of proportion to her degree of COPD, also of note she quit smoking in 1995. The diffusion capacity on PFTs is decreased out of proportion to degree of airway obstruction as noted by FEV1   She smoked about 40 pyrs  before she quit in 1995 when she had coronary angioplasty. Echo has shown diastolic dysfunction and moderate pulmonary hypertension    12/18/2016  Chief Complaint  Patient presents with  . Follow-up    3 month follow up. Breathing has been fine until recent URI.    Changed from [employer] to [employer] .  She is following with hematology for polycythemia . She underwent phlebotomy in 06/2016 - Last hemoglobin was 13.8 and felt that she does not need phlebotomy anymore  She unfortunately tripped and fell on the oxygen cord and fractured her left wrist She continues to volunteer at the hospital. Oxygen needs are increasing, she has increased to 2.5 L and in spite of this saturation was 80% on arrival to the office today improved to 92% with 4 L at rest  She shows me a letter from her insurance company that [place] will not be covered-alternatives are provided  She reports increased cough productive of clear white sputum for 2 days   Significant tests/ events  Chest x-ray shows increased interstitial prominence compared to her older films from 2009 HRCT 01/2016 >> no evidence of interstitial lung disease, mild bibasal scarring  PFTs 01/2016 -FEV1 1.15 - 64%, no BD response, DLCO 22%  Echo - RVSP 68, gr 2 DD, nml LV fn   Past Medical History:  Diagnosis Date  . Arthritis   . B12 deficiency   . Basal cell cancer    Nose  . Carotid artery occlusion   . Cataract   . Chronic  diastolic CHF (congestive heart failure) ([place])   . Colon polyps   . COPD (chronic obstructive pulmonary disease) ([place])    STAGE 2  . Coronary artery disease    status post angioplasty of the mid RCA in 1995  . Dyspnea   . First degree atrioventricular block   . History of obesity   . Hyperlipidemia   . Hypertension    well controlled  . Hyperthyroidism   . Lichen sclerosus et atrophicus of the vulva   . Osteopenia   . PAD (peripheral artery disease) ([facility])    with stable claudication      Review of Systems neg for any significant sore throat, dysphagia, itching, sneezing, nasal congestion or excess/ purulent secretions, fever, chills, sweats, unintended wt loss, pleuritic or exertional cp, hempoptysis, orthopnea pnd or change in chronic leg swelling. Also denies presyncope, palpitations, heartburn, abdominal pain, nausea, vomiting, diarrhea or change in bowel or urinary habits, dysuria,hematuria, rash, arthralgias, visual complaints, headache, numbness weakness or ataxia.     Objective:   Physical Exam  Gen. Pleasant, well-nourished, in no distress ENT - no lesions, no post nasal drip Neck: No JVD, no thyromegaly, no carotid bruits Lungs: no use of accessory muscles, no dullness to percussion, clear without rales or rhonchi  Cardiovascular: Rhythm regular, heart sounds  normal, no murmurs or gallops, 1+ peripheral edema Musculoskeletal:  No deformities, no cyanosis or clubbing        Assessment & Plan:

## 2016-12-18 NOTE — Addendum Note (Signed)
Addended by: Valerie Salts on: 12/18/2016 10:15 AM   Modules accepted: Orders

## 2016-12-18 NOTE — Patient Instructions (Signed)
Sample of ANORO once daily and prescription -this will take the place of STIOLTO  Stay on 2.5-3 L of oxygen when at rest Check saturation while walking to decide levels  Take Mucinex 600 mg twice daily for 7 days Call if phlegm changes color for antibiotic

## 2016-12-18 NOTE — Assessment & Plan Note (Signed)
Sample of ANORO once daily and prescription -this will take the place of STIOLTO   Take Mucinex 600 mg twice daily for 7 days Call if phlegm changes color for antibiotic

## 2016-12-18 NOTE — Assessment & Plan Note (Signed)
Stay on 2.5-3 L of oxygen when at rest Check saturation while walking to decide levels

## 2016-12-19 ENCOUNTER — Telehealth: Payer: Self-pay | Admitting: Cardiology

## 2016-12-19 DIAGNOSIS — M545 Low back pain: Secondary | ICD-10-CM | POA: Diagnosis not present

## 2016-12-19 DIAGNOSIS — Z6828 Body mass index (BMI) 28.0-28.9, adult: Secondary | ICD-10-CM | POA: Diagnosis not present

## 2016-12-19 DIAGNOSIS — J111 Influenza due to unidentified influenza virus with other respiratory manifestations: Secondary | ICD-10-CM | POA: Diagnosis not present

## 2016-12-19 DIAGNOSIS — R05 Cough: Secondary | ICD-10-CM | POA: Diagnosis not present

## 2016-12-19 DIAGNOSIS — S52532D Colles' fracture of left radius, subsequent encounter for closed fracture with routine healing: Secondary | ICD-10-CM | POA: Diagnosis not present

## 2016-12-19 DIAGNOSIS — M25532 Pain in left wrist: Secondary | ICD-10-CM | POA: Diagnosis not present

## 2016-12-19 DIAGNOSIS — S52572D Other intraarticular fracture of lower end of left radius, subsequent encounter for closed fracture with routine healing: Secondary | ICD-10-CM | POA: Diagnosis not present

## 2016-12-19 NOTE — Telephone Encounter (Signed)
New Message     Heart racing 98-100 , this has been going on for 2 days, no shortness of breath or chest pain.  This tends to happen when she starts coughing really hard.

## 2016-12-19 NOTE — Telephone Encounter (Signed)
Spoke with daughter she has pt in room to assist in discussion, she states that pt is sob and having palpitations x2days. She states that pt has a cold and has been taking mucinex for this. She states that she needs to be seen she thinks that this is her CHF. She is taking all her medications as ordered.   Made appt for 12-20-16 with [id number] 

## 2016-12-20 ENCOUNTER — Ambulatory Visit: Payer: PRIVATE HEALTH INSURANCE | Admitting: Physician Assistant

## 2016-12-26 ENCOUNTER — Encounter (HOSPITAL_COMMUNITY): Payer: Self-pay | Admitting: Emergency Medicine

## 2016-12-26 ENCOUNTER — Inpatient Hospital Stay (HOSPITAL_COMMUNITY)
Admission: EM | Admit: 2016-12-26 | Discharge: 2016-12-30 | DRG: 193 | Disposition: A | Discharge status: Home Health | Payer: Medicare Other | Attending: Internal Medicine | Admitting: Internal Medicine

## 2016-12-26 ENCOUNTER — Emergency Department (HOSPITAL_COMMUNITY): Payer: Medicare Other

## 2016-12-26 DIAGNOSIS — D649 Anemia, unspecified: Secondary | ICD-10-CM | POA: Diagnosis present

## 2016-12-26 DIAGNOSIS — R739 Hyperglycemia, unspecified: Secondary | ICD-10-CM | POA: Diagnosis present

## 2016-12-26 DIAGNOSIS — Z66 Do not resuscitate: Secondary | ICD-10-CM | POA: Diagnosis present

## 2016-12-26 DIAGNOSIS — Z87891 Personal history of nicotine dependence: Secondary | ICD-10-CM | POA: Diagnosis not present

## 2016-12-26 DIAGNOSIS — Z9861 Coronary angioplasty status: Secondary | ICD-10-CM | POA: Diagnosis not present

## 2016-12-26 DIAGNOSIS — I110 Hypertensive heart disease with heart failure: Secondary | ICD-10-CM | POA: Diagnosis present

## 2016-12-26 DIAGNOSIS — Z9981 Dependence on supplemental oxygen: Secondary | ICD-10-CM

## 2016-12-26 DIAGNOSIS — Z85828 Personal history of other malignant neoplasm of skin: Secondary | ICD-10-CM

## 2016-12-26 DIAGNOSIS — Z7982 Long term (current) use of aspirin: Secondary | ICD-10-CM

## 2016-12-26 DIAGNOSIS — I5032 Chronic diastolic (congestive) heart failure: Secondary | ICD-10-CM | POA: Diagnosis present

## 2016-12-26 DIAGNOSIS — J189 Pneumonia, unspecified organism: Secondary | ICD-10-CM | POA: Diagnosis not present

## 2016-12-26 DIAGNOSIS — D751 Secondary polycythemia: Secondary | ICD-10-CM | POA: Diagnosis present

## 2016-12-26 DIAGNOSIS — I2510 Atherosclerotic heart disease of native coronary artery without angina pectoris: Secondary | ICD-10-CM | POA: Diagnosis present

## 2016-12-26 DIAGNOSIS — J9621 Acute and chronic respiratory failure with hypoxia: Secondary | ICD-10-CM | POA: Diagnosis present

## 2016-12-26 DIAGNOSIS — J1100 Influenza due to unidentified influenza virus with unspecified type of pneumonia: Secondary | ICD-10-CM | POA: Diagnosis present

## 2016-12-26 DIAGNOSIS — E785 Hyperlipidemia, unspecified: Secondary | ICD-10-CM | POA: Diagnosis present

## 2016-12-26 DIAGNOSIS — J9620 Acute and chronic respiratory failure, unspecified whether with hypoxia or hypercapnia: Secondary | ICD-10-CM | POA: Diagnosis present

## 2016-12-26 DIAGNOSIS — Z79899 Other long term (current) drug therapy: Secondary | ICD-10-CM | POA: Diagnosis not present

## 2016-12-26 DIAGNOSIS — J440 Chronic obstructive pulmonary disease with acute lower respiratory infection: Secondary | ICD-10-CM | POA: Diagnosis present

## 2016-12-26 DIAGNOSIS — R918 Other nonspecific abnormal finding of lung field: Secondary | ICD-10-CM | POA: Diagnosis not present

## 2016-12-26 DIAGNOSIS — R000 Tachycardia, unspecified: Secondary | ICD-10-CM | POA: Diagnosis present

## 2016-12-26 DIAGNOSIS — R069 Unspecified abnormalities of breathing: Secondary | ICD-10-CM | POA: Diagnosis not present

## 2016-12-26 DIAGNOSIS — M25532 Pain in left wrist: Secondary | ICD-10-CM | POA: Diagnosis not present

## 2016-12-26 DIAGNOSIS — J441 Chronic obstructive pulmonary disease with (acute) exacerbation: Secondary | ICD-10-CM | POA: Diagnosis present

## 2016-12-26 DIAGNOSIS — J1000 Influenza due to other identified influenza virus with unspecified type of pneumonia: Secondary | ICD-10-CM | POA: Diagnosis not present

## 2016-12-26 DIAGNOSIS — R262 Difficulty in walking, not elsewhere classified: Secondary | ICD-10-CM

## 2016-12-26 DIAGNOSIS — S52532D Colles' fracture of left radius, subsequent encounter for closed fracture with routine healing: Secondary | ICD-10-CM | POA: Diagnosis not present

## 2016-12-26 DIAGNOSIS — I739 Peripheral vascular disease, unspecified: Secondary | ICD-10-CM | POA: Diagnosis present

## 2016-12-26 DIAGNOSIS — R05 Cough: Secondary | ICD-10-CM | POA: Diagnosis present

## 2016-12-26 DIAGNOSIS — J9811 Atelectasis: Secondary | ICD-10-CM | POA: Diagnosis present

## 2016-12-26 DIAGNOSIS — J8410 Pulmonary fibrosis, unspecified: Secondary | ICD-10-CM | POA: Diagnosis present

## 2016-12-26 DIAGNOSIS — J209 Acute bronchitis, unspecified: Secondary | ICD-10-CM | POA: Diagnosis present

## 2016-12-26 DIAGNOSIS — J111 Influenza due to unidentified influenza virus with other respiratory manifestations: Secondary | ICD-10-CM

## 2016-12-26 DIAGNOSIS — E039 Hypothyroidism, unspecified: Secondary | ICD-10-CM | POA: Diagnosis present

## 2016-12-26 HISTORY — DX: Pulmonary fibrosis, unspecified: J84.10

## 2016-12-26 LAB — I-STAT TROPONIN, ED: Troponin i, poc: 0 ng/mL (ref 0.00–0.08)

## 2016-12-26 LAB — CBC WITH DIFFERENTIAL/PLATELET
BASOS PCT: 0 %
Basophils Absolute: 0 10*3/uL (ref 0.0–0.1)
Eosinophils Absolute: 0 10*3/uL (ref 0.0–0.7)
Eosinophils Relative: 0 %
HEMATOCRIT: 40.8 % (ref 36.0–46.0)
Hemoglobin: 13.7 g/dL (ref 12.0–15.0)
LYMPHS ABS: 0.5 10*3/uL — AB (ref 0.7–4.0)
Lymphocytes Relative: 3 %
MCH: 29.2 pg (ref 26.0–34.0)
MCHC: 33.6 g/dL (ref 30.0–36.0)
MCV: 87 fL (ref 78.0–100.0)
MONO ABS: 0.5 10*3/uL (ref 0.1–1.0)
MONOS PCT: 3 %
NEUTROS ABS: 19.1 10*3/uL — AB (ref 1.7–7.7)
Neutrophils Relative %: 94 %
Platelets: 233 10*3/uL (ref 150–400)
RBC: 4.69 MIL/uL (ref 3.87–5.11)
RDW: 13.9 % (ref 11.5–15.5)
WBC: 20.1 10*3/uL — ABNORMAL HIGH (ref 4.0–10.5)

## 2016-12-26 LAB — COMPREHENSIVE METABOLIC PANEL
ALBUMIN: 3.1 g/dL — AB (ref 3.5–5.0)
ALK PHOS: 53 U/L (ref 38–126)
ALT: 16 U/L (ref 14–54)
AST: 22 U/L (ref 15–41)
Anion gap: 13 (ref 5–15)
BILIRUBIN TOTAL: 1 mg/dL (ref 0.3–1.2)
BUN: 17 mg/dL (ref 6–20)
CALCIUM: 9.1 mg/dL (ref 8.9–10.3)
CO2: 23 mmol/L (ref 22–32)
Chloride: 99 mmol/L — ABNORMAL LOW (ref 101–111)
Creatinine, Ser: 1.11 mg/dL — ABNORMAL HIGH (ref 0.44–1.00)
GFR calc Af Amer: 51 mL/min — ABNORMAL LOW (ref >60)
GFR calc non Af Amer: 44 mL/min — ABNORMAL LOW (ref >60)
Glucose, Bld: 163 mg/dL — ABNORMAL HIGH (ref 65–99)
Potassium: 3.8 mmol/L (ref 3.5–5.1)
Sodium: 135 mmol/L (ref 135–145)
TOTAL PROTEIN: 7.4 g/dL (ref 6.5–8.1)

## 2016-12-26 LAB — URINALYSIS, ROUTINE W REFLEX MICROSCOPIC
Bilirubin Urine: NEGATIVE
Glucose, UA: NEGATIVE mg/dL
Ketones, ur: 5 mg/dL — AB
Nitrite: NEGATIVE
Protein, ur: 100 mg/dL — AB
SPECIFIC GRAVITY, URINE: 1.025 (ref 1.005–1.030)
pH: 5 (ref 5.0–8.0)

## 2016-12-26 LAB — INFLUENZA PANEL BY PCR (TYPE A & B)
INFLBPCR: NEGATIVE
Influenza A By PCR: POSITIVE — AB

## 2016-12-26 LAB — I-STAT CG4 LACTIC ACID, ED: Lactic Acid, Venous: 1.48 mmol/L (ref 0.5–1.9)

## 2016-12-26 LAB — LIPASE, BLOOD: Lipase: 21 U/L (ref 11–51)

## 2016-12-26 MED ORDER — UMECLIDINIUM-VILANTEROL 62.5-25 MCG/INH IN AEPB: 1.0000 | INHALATION_SPRAY | Freq: Every day | RESPIRATORY_TRACT | Status: DC

## 2016-12-26 MED ORDER — DEXTROSE 5 % IV SOLN
1.0000 g | INTRAVENOUS | Status: DC
  Administered 2016-12-27 – 2016-12-29 (×3): 1 g via INTRAVENOUS
  Filled 2016-12-26 (×3): qty 1

## 2016-12-26 MED ORDER — LEVOTHYROXINE SODIUM 25 MCG PO TABS
137.0000 ug | ORAL_TABLET | Freq: Every day | ORAL | Status: DC
  Administered 2016-12-27 – 2016-12-30 (×4): 137 ug via ORAL
  Filled 2016-12-26 (×4): qty 1

## 2016-12-26 MED ORDER — ENOXAPARIN SODIUM 40 MG/0.4ML ~~LOC~~ SOLN
40.0000 mg | SUBCUTANEOUS | Status: DC
  Administered 2016-12-27 – 2016-12-28 (×2): 40 mg via SUBCUTANEOUS
  Filled 2016-12-26 (×3): qty 0.4

## 2016-12-26 MED ORDER — SODIUM CHLORIDE 0.9 % IV SOLN
INTRAVENOUS | Status: DC
  Administered 2016-12-26: 22:00:00 via INTRAVENOUS

## 2016-12-26 MED ORDER — ISOSORBIDE MONONITRATE ER 30 MG PO TB24
30.0000 mg | ORAL_TABLET | Freq: Every day | ORAL | Status: DC
  Administered 2016-12-27 – 2016-12-30 (×4): 30 mg via ORAL
  Filled 2016-12-26 (×4): qty 1

## 2016-12-26 MED ORDER — SODIUM CHLORIDE 0.9 % IV BOLUS (SEPSIS)
500.0000 mL | Freq: Once | INTRAVENOUS | Status: AC
  Administered 2016-12-26: 500 mL via INTRAVENOUS

## 2016-12-26 MED ORDER — VANCOMYCIN HCL IN DEXTROSE 1-5 GM/200ML-% IV SOLN
1000.0000 mg | Freq: Once | INTRAVENOUS | Status: DC
  Filled 2016-12-26: qty 200

## 2016-12-26 MED ORDER — UMECLIDINIUM BROMIDE 62.5 MCG/INH IN AEPB
1.0000 | INHALATION_SPRAY | Freq: Every day | RESPIRATORY_TRACT | Status: DC
  Filled 2016-12-26: qty 7

## 2016-12-26 MED ORDER — METOPROLOL TARTRATE 50 MG PO TABS
50.0000 mg | ORAL_TABLET | Freq: Two times a day (BID) | ORAL | Status: DC
  Administered 2016-12-26 – 2016-12-30 (×8): 50 mg via ORAL
  Filled 2016-12-26 (×9): qty 1

## 2016-12-26 MED ORDER — ATORVASTATIN CALCIUM 20 MG PO TABS
20.0000 mg | ORAL_TABLET | Freq: Every evening | ORAL | Status: DC
  Administered 2016-12-26 – 2016-12-29 (×4): 20 mg via ORAL
  Filled 2016-12-26 (×4): qty 1

## 2016-12-26 MED ORDER — ASPIRIN 81 MG PO CHEW
81.0000 mg | CHEWABLE_TABLET | Freq: Every day | ORAL | Status: DC
  Administered 2016-12-27 – 2016-12-30 (×4): 81 mg via ORAL
  Filled 2016-12-26 (×4): qty 1

## 2016-12-26 MED ORDER — VANCOMYCIN HCL IN DEXTROSE 1-5 GM/200ML-% IV SOLN
1000.0000 mg | INTRAVENOUS | Status: DC
  Administered 2016-12-27 – 2016-12-28 (×2): 1000 mg via INTRAVENOUS
  Filled 2016-12-26 (×2): qty 200

## 2016-12-26 MED ORDER — ARFORMOTEROL TARTRATE 15 MCG/2ML IN NEBU
15.0000 ug | INHALATION_SOLUTION | Freq: Two times a day (BID) | RESPIRATORY_TRACT | Status: DC
  Administered 2016-12-26 – 2016-12-30 (×7): 15 ug via RESPIRATORY_TRACT
  Filled 2016-12-26 (×7): qty 2

## 2016-12-26 MED ORDER — DEXTROSE 5 % IV SOLN
2.0000 g | Freq: Once | INTRAVENOUS | Status: AC
  Administered 2016-12-26: 2 g via INTRAVENOUS
  Filled 2016-12-26: qty 2

## 2016-12-26 MED ORDER — SODIUM CHLORIDE 0.9 % IV SOLN
1500.0000 mg | Freq: Once | INTRAVENOUS | Status: AC
  Administered 2016-12-26: 1500 mg via INTRAVENOUS
  Filled 2016-12-26: qty 1500

## 2016-12-26 NOTE — ED Provider Notes (Signed)
Emergency Department Provider Note   I have reviewed the triage vital signs and the nursing notes.   HISTORY  Chief Complaint Shortness of Breath   HPI [name] is a 83 y.o. female with PMH of COPD on 2-5L home O2, CHF, CAD, HLD, and HTN presents to the emergency department for evaluation of difficulty breathing with associated fevers, chills, generalized weakness. Patient was diagnosed with flu last week and started on Tamiflu. She reports taking steroid and anabiotic's for several days and seemed to improve. She states she felt well yesterday and went about her normal activities. This morning she went to physical therapy and did well there but after returning home she developed rather sudden onset difficulty breathing with generalized weakness and fevers. She reports it felt very similar to her flulike symptoms last week. She had increased her oxygen. The only lingering symptom from last week was a persistent cough. She notes some left lateral chest wall pain with coughing but no pain at rest. She continues to eat and drink well but states that she feels bad.    Past Medical History:  Diagnosis Date  . Arthritis   . B12 deficiency   . Basal cell cancer    Nose  . Carotid artery occlusion   . Cataract   . Chronic diastolic CHF (congestive heart failure) ([place])   . Colon polyps   . COPD (chronic obstructive pulmonary disease) ([place])    STAGE 2  . Coronary artery disease    status post angioplasty of the mid RCA in 1995  . Dyspnea   . First degree atrioventricular block   . History of obesity   . Hyperlipidemia   . Hypertension    well controlled  . Hyperthyroidism   . Lichen sclerosus et atrophicus of the vulva   . Osteopenia   . PAD (peripheral artery disease) ([facility])    with stable claudication  . Pulmonary fibrosis ([place])    wears 4L home O2 at baseline    Patient Active Problem List   Diagnosis Date Noted  . Acute on chronic respiratory failure ([place]) 12/27/2016   . Tachycardia 12/27/2016  . Influenza with pneumonia 12/26/2016  . Chronic respiratory failure with hypoxia ([place]) 07/10/2016  . Polycythemia, secondary 07/05/2016  . Pulmonary hypertension 02/06/2016  . COPD GOLD II  02/05/2016  . Chronic diastolic CHF (congestive heart failure) ([place]) 11/01/2014  . CHF (congestive heart failure), NYHA class II ([place]) 10/18/2014  . Carotid stenosis 09/21/2014  . Osteopenia 10/27/2013  . Aftercare following surgery of the circulatory system, [place] 09/15/2013  . 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 Vaginal atrophy 05/20/2013  . Peripheral vascular disease ([place]) 02/10/2013  . Occlusion and stenosis of carotid artery without mention of cerebral infarction 09/16/2012  . Pain in limb 09/16/2012  . Hypertension   . Hyperlipidemia   . PAD (peripheral artery disease) ([place])   . Coronary artery disease     Past Surgical History:  Procedure Laterality Date  . CAROTID ENDARTERECTOMY Right 2002  . CATARACT EXTRACTION W/ INTRAOCULAR LENS  IMPLANT, BILATERAL    . CHOLECYSTECTOMY  1999  . ENDARTERECTOMY     right carotid endarterectomy  . KNEE ARTHROSCOPY Left   . MENISCUS REPAIR Left 10/29/2012  . OPEN REDUCTION INTERNAL FIXATION (ORIF) DISTAL RADIAL FRACTURE Left 11/08/2016   Procedure: OPEN TREATMENT OF LEFT DISTAL RADIUS FRACTURE;  Surgeon: Milly Jakob, MD;  Location: [facility];  Service: Orthopedics;  Laterality: Left;  . REPAIR TENDONS FOOT  Allergies Patient has no known allergies.  Family History  Problem Relation Age of Onset  . Heart attack Father 68  . Hypertension Father   . Heart disease Brother     underwent coronary bypass grafting at age 42  . Hypertension Brother   . Diabetes Sister     Social History Social History  Substance Use Topics  . Smoking status: Former Smoker    Packs/day: 1.00    Years: 40.00    Types: Cigarettes    Quit date: 11/18/1993  . Smokeless tobacco: Never Used  . Alcohol use No    Review of  Systems  Constitutional: Positive fever/chills Eyes: No visual changes. ENT: No sore throat. Cardiovascular: Denies chest pain. Respiratory: Positive shortness of breath. Gastrointestinal: No abdominal pain.  No nausea, no vomiting.  No diarrhea.  No constipation. Genitourinary: Negative for dysuria. Musculoskeletal: Negative for back pain. Skin: Negative for rash. Neurological: Negative for headaches, focal weakness or numbness.  10-point ROS otherwise negative.  ____________________________________________   PHYSICAL EXAM:  VITAL SIGNS: ED Triage Vitals [12/26/16 1743]  Enc Vitals Group     BP (!) 140/46     Pulse Rate (!) 126     Resp 17     Temp 100.2 F (37.9 C)     Temp Source Oral     SpO2 90 %   Constitutional: Alert and oriented. Well appearing and in no acute distress. Eyes: Conjunctivae are normal. Head: Atraumatic. Nose: No congestion/rhinnorhea. Mouth/Throat: Mucous membranes are moist.  Oropharynx non-erythematous. Neck: No stridor.  Cardiovascular: Sinus tachycardia. Good peripheral circulation. Grossly normal heart sounds.   Respiratory: Increased respiratory effort.  No retractions. Lungs with course wheezing throughout both lower lung fields.  Gastrointestinal: Soft and nontender. No distention.  Musculoskeletal: No lower extremity tenderness nor edema. No gross deformities of extremities. Neurologic:  Normal speech and language. No gross focal neurologic deficits are appreciated.  Skin:  Skin is warm, dry and intact. No rash noted. Psychiatric: Mood and affect are normal. Speech and behavior are normal.  ____________________________________________   LABS (all labs ordered are listed, but only abnormal results are displayed)  Labs Reviewed  COMPREHENSIVE METABOLIC PANEL - Abnormal; Notable for the following:       Result Value   Chloride 99 (*)    Glucose, Bld 163 (*)    Creatinine, Ser 1.11 (*)    Albumin 3.1 (*)    GFR calc non Af Amer 44  (*)    GFR calc Af Amer 51 (*)    All other components within normal limits  CBC WITH DIFFERENTIAL/PLATELET - Abnormal; Notable for the following:    WBC 20.1 (*)    Neutro Abs 19.1 (*)    Lymphs Abs 0.5 (*)    All other components within normal limits  INFLUENZA PANEL BY PCR (TYPE A & B) - Abnormal; Notable for the following:    Influenza A By PCR POSITIVE (*)    All other components within normal limits  URINALYSIS, ROUTINE W REFLEX MICROSCOPIC - Abnormal; Notable for the following:    Color, Urine AMBER (*)    APPearance HAZY (*)    Hgb urine dipstick SMALL (*)    Ketones, ur 5 (*)    Protein, ur 100 (*)    Leukocytes, UA SMALL (*)    Bacteria, UA RARE (*)    Squamous Epithelial / LPF 6-30 (*)    All other components within normal limits  BASIC METABOLIC PANEL - Abnormal; Notable for  the following:    Glucose, Bld 200 (*)    BUN 21 (*)    Creatinine, Ser 1.08 (*)    Calcium 8.5 (*)    GFR calc non Af Amer 45 (*)    GFR calc Af Amer 53 (*)    All other components within normal limits  CBC WITH DIFFERENTIAL/PLATELET - Abnormal; Notable for the following:    WBC 18.5 (*)    Hemoglobin 11.9 (*)    HCT 35.7 (*)    Neutro Abs 17.3 (*)    Lymphs Abs 0.5 (*)    All other components within normal limits  CULTURE, BLOOD (ROUTINE X 2)  CULTURE, BLOOD (ROUTINE X 2)  CULTURE, EXPECTORATED SPUTUM-ASSESSMENT  GRAM STAIN  LIPASE, BLOOD  STREP PNEUMONIAE URINARY ANTIGEN  PROCALCITONIN  I-STAT TROPOININ, ED  I-STAT CG4 LACTIC ACID, ED   ____________________________________________  EKG   EKG Interpretation  Date/Time:  Thursday December 26 2016 17:41:13 EST Ventricular Rate:  128 PR Interval:    QRS Duration: 89 QT Interval:  291 QTC Calculation: 425 R Axis:   88 Text Interpretation:  Sinus tachycardia Borderline right axis deviation Consider anterior infarct ST depression, probably rate related No STEMI.  Confirmed by LONG MD, JOSHUA [id number]) on 12/26/2016 5:48:03 PM        ____________________________________________  RADIOLOGY  Dg Chest 2 View  Result Date: 12/26/2016 CLINICAL DATA:  Fever and chills EXAM: CHEST  2 VIEW COMPARISON:  Chest radiograph 02/05/2016 FINDINGS: Therapy bibasilar opacities. There is calcification in the aortic arch. No pleural effusion or pneumothorax. IMPRESSION: Bibasilar opacities, favored indicate atelectasis, though developing consolidation may have the same appearance. Electronically Signed   By: Ulyses Jarred M.D.   On: 12/26/2016 19:09    ____________________________________________   PROCEDURES  Procedure(s) performed:   Procedures  None ____________________________________________   INITIAL IMPRESSION / ASSESSMENT AND PLAN / ED COURSE  Pertinent labs & imaging results that were available during my care of the patient were reviewed by me and considered in my medical decision making (see chart for details).  Patient resents to the emergency department for evaluation of difficulty breathing, fevers, chills, generalized weakness that began suddenly this morning. Patient with no active chest pain. She has borderline fever, tachycardia, and hypoxemia despite being on her max home oxygen of 5 L. No evidence of lower extremity edema or signs of volume overload. Patient had tried albuterol which may be contributing to her elevated heart rate but this seems out of proportion even for albuterol treatments. I have some clinical suspicion for post-influenza pneumonia. Plan for chest x-ray, labs, cultures, urinalysis. Patient not requiring BiPAP at this time.   Patient with possible developing infiltrate on CXR. Flu pending. Patient with increased O2 requirement and worsening hypoxemia mith minimal exertion.  Started abx and discussed with hospitalist.   Discussed patient's case with hospitalist, Dr. Lorin Mercy. Patient and family (if present) updated with plan. Care transferred to hospitalist service.  I reviewed all nursing  notes, vitals, pertinent old records, EKGs, labs, imaging (as available).  ____________________________________________  FINAL CLINICAL IMPRESSION(S) / ED DIAGNOSES  Final diagnoses:  Community acquired pneumonia, unspecified laterality     MEDICATIONS GIVEN DURING THIS VISIT:  Medications  vancomycin (VANCOCIN) IVPB 1000 mg/200 mL premix (not administered)  ceFEPIme (MAXIPIME) 1 g in dextrose 5 % 50 mL IVPB (not administered)  atorvastatin (LIPITOR) tablet 20 mg (20 mg Oral Given 12/26/16 2241)  metoprolol (LOPRESSOR) tablet 50 mg (50 mg Oral Given 12/27/16 0933)  isosorbide  mononitrate (IMDUR) 24 hr tablet 30 mg (30 mg Oral Given 12/27/16 0933)  aspirin chewable tablet 81 mg (81 mg Oral Given 12/27/16 0933)  levothyroxine (SYNTHROID, LEVOTHROID) tablet 137 mcg (137 mcg Oral Given 12/27/16 0537)  enoxaparin (LOVENOX) injection 40 mg (40 mg Subcutaneous Given 12/27/16 0933)  0.9 %  sodium chloride infusion ( Intravenous Transfusing/Transfer 12/27/16 0740)  arformoterol (BROVANA) nebulizer solution 15 mcg (15 mcg Nebulization Given 12/27/16 0800)  methylPREDNISolone sodium succinate (SOLU-MEDROL) 125 mg/2 mL injection 60 mg (60 mg Intravenous Given 12/27/16 0936)  oseltamivir (TAMIFLU) capsule 30 mg (30 mg Oral Given 12/27/16 0933)  albuterol (PROVENTIL) (2.5 MG/3ML) 0.083% nebulizer solution 2.5 mg (2.5 mg Nebulization Given 12/27/16 0400)  ipratropium-albuterol (DUONEB) 0.5-2.5 (3) MG/3ML nebulizer solution 3 mL (3 mLs Nebulization Given 12/27/16 0736)  sodium chloride 0.9 % bolus 500 mL (0 mLs Intravenous Stopped 12/26/16 1959)  ceFEPIme (MAXIPIME) 2 g in dextrose 5 % 50 mL IVPB (0 g Intravenous Stopped 12/26/16 2043)  vancomycin (VANCOCIN) 1,500 mg in sodium chloride 0.9 % 500 mL IVPB (0 mg Intravenous Stopped 12/26/16 2300)     NEW OUTPATIENT MEDICATIONS STARTED DURING THIS VISIT:  None   Note:  This document was prepared using Dragon voice recognition software and may include unintentional dictation  errors.  Nanda Quinton, MD Emergency Medicine   Margette Fast, MD 12/27/16 1120

## 2016-12-26 NOTE — H&P (Signed)
History and Physical    [name] [id number] DOB: 27-Nov-1928 DOA: 12/26/2016  PCP: Jerlyn Ly, MD Consultants:  [place] - cardiology; [place] - pulmonlogy; Dal[place]orf - ortohpedics; [place] - vascular Patient coming from: home - lives alone; : daughter, [phone], [phone]  Chief Complaint: flu-like illness  HPI: [name] is a 83 y.o. female with medical history significant of pulmonary fibrosis on 4L home O2; PAD; HTN; HLD; CAD; and carotid artery disease presenting with flu-like illness.  Last Monday, she felt good and went to PT and got her nails done.  Developed a slight cough.  Tuesday, she felt tired but cough was better.  Continued Wednesday.  Thursday, her daughter took her to PT and she felt "like death warmed over."  Went to see ortho, wrist is doing well.  Called Dr. Joylene Draft - heart racing, cough, tired.  Went in to the office and diagnosed with type A influenza.  She was given an antibiotic x 3 days; Tamiflu (completed Tuesday); prednisone (finished yesterday).  Last night, she continued to have cough and gurgling in her throat.  Went to PT this AM and went home feeling ok.  Developed acute onset of chills, teeth chattering, P 130, pulse O2 88-90.  Too weak to walk.  Called doctor and then called neighbor and she called 911.   T101.  No sick contacts.  Nonproductive cough.  Usually wears 4L [place] O2 at home, turned it to 5L, drops even lower with ambulation.  ED Course: Per Dr. Laverta Baltimore: Patient resents to the emergency department for evaluation of difficulty breathing, fevers, chills, generalized weakness that began suddenly this morning. Patient with no active chest pain. She has borderline fever, tachycardia, and hypoxemia despite being on her max home oxygen of 5 L. No evidence of lower extremity edema or signs of volume overload. Patient had tried albuterol which may be contributing to her elevated heart rate but this seems out of proportion even for albuterol treatments. I  have some clinical suspicion for post-influenza pneumonia. Plan for chest x-ray, labs, cultures, urinalysis. Patient not requiring BiPAP at this time.    Review of Systems: As per HPI; otherwise 10 point review of systems reviewed and negative.   Ambulatory Status:  Ambulates indepednently  Past Medical History:  Diagnosis Date  . Arthritis   . B12 deficiency   . Basal cell cancer    Nose  . Carotid artery occlusion   . Cataract   . Chronic diastolic CHF (congestive heart failure) ([place])   . Colon polyps   . COPD (chronic obstructive pulmonary disease) ([place])    STAGE 2  . Coronary artery disease    status post angioplasty of the mid RCA in 1995  . Dyspnea   . First degree atrioventricular block   . History of obesity   . Hyperlipidemia   . Hypertension    well controlled  . Hyperthyroidism   . Lichen sclerosus et atrophicus of the vulva   . Osteopenia   . PAD (peripheral artery disease) (HCC)    with stable claudication  . Pulmonary fibrosis ([place])    wears 4L home O2 at baseline    Past Surgical History:  Procedure Laterality Date  . CAROTID ENDARTERECTOMY Right 2002  . CATARACT EXTRACTION W/ INTRAOCULAR LENS  IMPLANT, BILATERAL    . CHOLECYSTECTOMY  1999  . ENDARTERECTOMY     right carotid endarterectomy  . KNEE ARTHROSCOPY Left   . MENISCUS REPAIR Left 10/29/2012  . OPEN REDUCTION INTERNAL FIXATION (  ORIF) DISTAL RADIAL FRACTURE Left 11/08/2016   Procedure: OPEN TREATMENT OF LEFT DISTAL RADIUS FRACTURE;  Surgeon: Milly Jakob, MD;  Location: [facility];  Service: Orthopedics;  Laterality: Left;  . REPAIR TENDONS FOOT      Social History   Social History  . Marital status: Widowed    Spouse name: N/A  . Number of children: 3  . Years of education: N/A   Occupational History  . nurse     retired   Social History Main Topics  . Smoking status: Former Smoker    Packs/day: 1.00    Years: 40.00    Types: Cigarettes    Quit date: 11/18/1993  . Smokeless  tobacco: Never Used  . Alcohol use No  . Drug use: No  . Sexual activity: Not on file   Other Topics Concern  . Not on file   Social History Narrative  . No narrative on file    No Known Allergies  Family History  Problem Relation Age of Onset  . Heart attack Father 58  . Hypertension Father   . Heart disease Brother     underwent coronary bypass grafting at age 74  . Hypertension Brother   . Diabetes Sister     Prior to Admission medications   Medication Sig Start Date End Date Taking? Authorizing Provider  acetaminophen (TYLENOL) 500 MG tablet Take 1,000 mg by mouth every 6 (six) hours as needed.   Yes Historical Provider, MD  Aloe-Sodium Chloride (AYR SALINE NASAL GEL NA) Place 1 application into the nose daily as needed (dryness).   Yes Historical Provider, MD  aspirin 81 MG tablet Take 81 mg by mouth daily.     Yes Historical Provider, MD  atorvastatin (LIPITOR) 40 MG tablet Take 20 mg by mouth every evening.   Yes Historical Provider, MD  Carboxymethylcellul-Glycerin (LUBRICATING EYE DROPS OP) Apply 1 drop to eye daily as needed (dry eyes).   Yes Historical Provider, MD  Cholecalciferol (VITAMIN D3) 2000 units capsule Take 2,000 Units by mouth daily.   Yes Historical Provider, MD  clobetasol cream (TEMOVATE) 0.05 % Apply externally twice a week Patient taking differently: Apply 1 application topically once a week.  11/02/15  Yes Terrance Mass, MD  Coenzyme Q10 (CO Q 10) 100 MG CAPS Take 100 mg by mouth daily.   Yes Historical Provider, MD  Cyanocobalamin (B-12) 500 MCG TABS Take 500 mcg by mouth daily.   Yes Historical Provider, MD  denosumab (PROLIA) 60 MG/ML SOLN injection Inject 60 mg into the skin every 6 (six) months. Administer in upper arm, thigh, or abdomen   Yes Historical Provider, MD  folic acid (FOLVITE) A999333 MCG tablet Take 400 mcg by mouth every evening.    Yes Historical Provider, MD  furosemide (LASIX) 20 MG tablet TAKE 1 TABLET(20 MG) BY MOUTH  DAILY Patient taking differently: TAKE 1 TABLET(20 MG) BY MOUTH DAILY IN THE AFTERNOON 08/06/16  Yes Peter M [place], MD  isosorbide mononitrate (IMDUR) 30 MG 24 hr tablet Take 1 tablet (30 mg total) by mouth daily. 07/23/16  Yes Rhonda G Barrett, PA-C  levothyroxine (SYNTHROID, LEVOTHROID) 137 MCG tablet Take 137 mcg by mouth daily.     Yes Historical Provider, MD  metoprolol (LOPRESSOR) 50 MG tablet Take 50 mg by mouth 2 (two) times daily.   Yes Historical Provider, MD  Multiple Vitamin (MULTIVITAMIN WITH MINERALS) TABS tablet Take 1 tablet by mouth daily.   Yes Historical Provider, MD  multivitamin-lutein [facility]) CAPS  capsule Take 1 capsule by mouth daily.   Yes Historical Provider, MD  nitroGLYCERIN (NITROSTAT) 0.4 MG SL tablet Place 0.4 mg under the tongue every 5 (five) minutes as needed for chest pain (x 3 pills).   Yes Historical Provider, MD  NONFORMULARY OR COMPOUNDED ITEM Estradiol 0000000 mg [id number] applicators s: insert vaginally twice weekly. 07/12/16  Yes Terrance Mass, MD  Omega-3 Fatty Acids (FISH OIL) 1200 MG CAPS Take 1,200 mg by mouth 2 (two) times daily.   Yes Historical Provider, MD  umeclidinium-vilanterol (ANORO ELLIPTA) 62.5-25 MCG/INH AEPB Inhale 1 puff into the lungs daily. 12/18/16  Yes Rigoberto Noel, MD  VITAMIN E PO Take 500 Units by mouth daily.    Yes Historical Provider, MD  azithromycin (ZITHROMAX) 500 MG tablet Take 500 mg by mouth daily. 12/19/16   Historical Provider, MD  oseltamivir (TAMIFLU) 75 MG capsule Take 75 mg by mouth 2 (two) times daily. 12/19/16   Historical Provider, MD  predniSONE (STERAPRED UNI-PAK 21 TAB) 5 MG (21) TBPK tablet Take 5-30 mg by mouth daily. Taper down dosing 12/19/16   Historical Provider, MD    Physical Exam: Vitals:   12/26/16 2130 12/26/16 2145 12/26/16 2148 12/26/16 2222  BP: (!) 115/48 (!) 133/50 (!) 133/50 (!) 131/46  Pulse: 110 110 110 (!) 109  Resp: 23 16 17 20   Temp:    98 F (36.7 C)  TempSrc:    Oral  SpO2: 92% (!) 89%  93% 93%  Weight:    73.6 kg (162 lb 3.2 oz)  Height:    5\' 3"  (1.6 m)     General: Appears calm and comfortable and is NAD Eyes:  PERRL, EOMI, normal lids, iris ENT:  grossly normal hearing, lips & tongue, mmm Neck:  no LAD, masses or thyromegaly Cardiovascular:  RRR, no m/r/g. No LE edema.  Respiratory:  Diffuse coarse interstitial breath sounds.  Mildly increased WOB at rest.  Sats drop to 88-90% just with patient leaning forward.  Abdomen:  soft, ntnd, NABS Skin:  no rash or induration seen on limited exam Musculoskeletal:  grossly normal tone BUE/BLE, good ROM, no bony abnormality Psychiatric:  grossly normal mood and affect, speech fluent and appropriate, AOx3.  Very cognitively intact. Neurologic:  CN 2-12 grossly intact, moves all extremities in coordinated fashion, sensation intact  Labs on Admission: I have personally reviewed following labs and imaging studies  CBC:  Recent Labs Lab 12/26/16 1918  WBC 20.1*  NEUTROABS 19.1*  HGB 13.7  HCT 40.8  MCV 87.0  PLT 0000000   Basic Metabolic Panel:  Recent Labs Lab 12/26/16 1918  NA 135  K 3.8  CL 99*  CO2 23  GLUCOSE 163*  BUN 17  CREATININE 1.11*  CALCIUM 9.1   GFR: Estimated Creatinine Clearance: 35.6 mL/min (by C-G formula based on SCr of 1.11 mg/dL (H)). Liver Function Tests:  Recent Labs Lab 12/26/16 1918  AST 22  ALT 16  ALKPHOS 53  BILITOT 1.0  PROT 7.4  ALBUMIN 3.1*    Recent Labs Lab 12/26/16 1918  LIPASE 21   No results for input(s): AMMONIA in the last 168 hours. Coagulation Profile: No results for input(s): INR, PROTIME in the last 168 hours. Cardiac Enzymes: No results for input(s): CKTOTAL, CKMB, CKMBINDEX, TROPONINI in the last 168 hours. BNP (last 3 results) No results for input(s): PROBNP in the last 8760 hours. HbA1C: No results for input(s): HGBA1C in the last 72 hours. CBG: No results for input(s): GLUCAP  in the last 168 hours. Lipid Profile: No results for input(s):  CHOL, HDL, LDLCALC, TRIG, CHOLHDL, LDLDIRECT in the last 72 hours. Thyroid Function Tests: No results for input(s): TSH, T4TOTAL, FREET4, T3FREE, THYROIDAB in the last 72 hours. Anemia Panel: No results for input(s): VITAMINB12, FOLATE, FERRITIN, TIBC, IRON, RETICCTPCT in the last 72 hours. Urine analysis:    Component Value Date/Time   COLORURINE AMBER (A) 12/26/2016 2240   APPEARANCEUR HAZY (A) 12/26/2016 2240   LABSPEC 1.025 12/26/2016 2240   PHURINE 5.0 12/26/2016 2240   GLUCOSEU NEGATIVE 12/26/2016 2240   HGBUR SMALL (A) 12/26/2016 2240   BILIRUBINUR NEGATIVE 12/26/2016 2240   KETONESUR 5 (A) 12/26/2016 2240   PROTEINUR 100 (A) 12/26/2016 2240   UROBILINOGEN 0.2 05/20/2013 0934   NITRITE NEGATIVE 12/26/2016 2240   LEUKOCYTESUR SMALL (A) 12/26/2016 2240    Creatinine Clearance: Estimated Creatinine Clearance: 35.6 mL/min (by C-G formula based on SCr of 1.11 mg/dL (H)).  Sepsis Labs: @LABRCNTIP (procalcitonin:4,lacticidven:4) ) Recent Results (from the past 240 hour(s))  Culture, blood (routine x 2)     Status: None (Preliminary result)   Collection Time: 12/26/16  7:13 PM  Result Value Ref Range Status   Specimen Description BLOOD LEFT ARM  Final   Special Requests BOTTLES DRAWN AEROBIC ONLY 5CC  Final   Culture PENDING  Incomplete   Report Status PENDING  Incomplete     Radiological Exams on Admission: Dg Chest 2 View  Result Date: 12/26/2016 CLINICAL DATA:  Fever and chills EXAM: CHEST  2 VIEW COMPARISON:  Chest radiograph 02/05/2016 FINDINGS: Therapy bibasilar opacities. There is calcification in the aortic arch. No pleural effusion or pneumothorax. IMPRESSION: Bibasilar opacities, favored indicate atelectasis, though developing consolidation may have the same appearance. Electronically Signed   By: Ulyses Jarred M.D.   On: 12/26/2016 19:09    EKG: Independently reviewed.  Sinus tachycardia with rate 128; nonspecific ST changes thought to be rate  related  Assessment/Plan Principal Problem:   Influenza with pneumonia Active Problems:   Acute on chronic respiratory failure (HCC)   Tachycardia   Influenza B (previously positive for A) with probable developing CAP; acute on chronic respiratory failure with hypoxia -Given cough, fever, mildly decreased oxygen saturation on usual home O2, and possible developing infiltrate in lower lobes on chest x-ray , most likely healthcare associated pneumonia (she volunteers in the hospital; has previously been treated for CAP; and recently had wrist surgery).  -Influenza B positive; reports she was positive for influenza A last week. -Will start Cefepime and Vancomycin. -NS @ 75cc/hr -Fever control -Repeat CBC in am -Sputum cultures -Blood cultures -Strep pneumo testing -Solumedrol for interstitial lung disease component. -albuterol PRN -Standing Duonebs -Anticipate improvement in tachycardia with rehydration/treatment of infection -WBC 20.1   DVT prophylaxis:  Lovenox Code Status: DNR - confirmed with patient/family Family Communication: Daughter and son-in-law present throughout evaluation Disposition Plan:  Home once clinically improved Consults called: None  Admission status: Admit - It is my clinical opinion that admission to INPATIENT is reasonable and necessary because this patient will require at least 2 midnights in the hospital to treat this condition based on the medical complexity of the problems presented.  Given the aforementioned information, the predictability of an adverse outcome is felt to be significant.    Karmen Bongo MD [facility]  If 7PM-7AM, please contact night-coverage [email or web address] Password [email or web address]  12/27/2016, 2:11 AM

## 2016-12-26 NOTE — Progress Notes (Signed)
Patient admitted from ED with SOB. Positive for influenza A. Alert and oriented to room and surroundings. Droplet precaution initiated. Telemetry monitor placed and confirmed by two staff. Family visiting. Will continue to monitor.

## 2016-12-26 NOTE — ED Triage Notes (Signed)
Patient from home for shortness of breath that has gotten worse since 12/19/16 when she was dx with flu,  Patient normally wears 2-5L O2 [place] at home.  Today at 1400 patient states shortness of breath got significantly worse.  Patient complains of increased pain with coughing.  Patient is alert and oriented at this time.  20g saline lock in right AC.

## 2016-12-26 NOTE — ED Notes (Signed)
Admitting at bedside 

## 2016-12-26 NOTE — Progress Notes (Signed)
Pharmacy Antibiotic Note  [name] is a 83 y.o. female admitted on 12/26/2016 with pneumonia. Pt reports recent diagnosis of flu treated with Tamiflu. Pharmacy has been consulted for vancomycin and cefepime dosing.  Plan: -Vancomycin 1500mg  IV x1 then 1000mg  IV q24h -Cefepime 2g IV x1 then 1g IV q24h -Monitor renal function, cultures, LOT, vancomycin level as indicated    Temp (24hrs), Avg:100.2 F (37.9 C), Min:100.2 F (37.9 C), Max:100.2 F (37.9 C)   Recent Labs Lab 12/26/16 1918 12/26/16 1932  WBC 20.1*  --   LATICACIDVEN  --  1.48    CrCl cannot be calculated (Patient's most recent lab result is older than the maximum 21 days allowed.).    No Known Allergies  Antimicrobials this admission: 2/8 Cefepime >>  2/8 Vancomycin >>   Dose adjustments this admission: none  Microbiology results: 2/8 BCx: IP  Thank you for allowing pharmacy to be a part of this patient's care.  Arrie Senate, PharmD PGY-1 Pharmacy Resident Pager: [phone] 12/26/2016

## 2016-12-27 ENCOUNTER — Inpatient Hospital Stay (HOSPITAL_COMMUNITY): Payer: Medicare Other

## 2016-12-27 ENCOUNTER — Encounter (HOSPITAL_COMMUNITY): Payer: Self-pay | Admitting: *Deleted

## 2016-12-27 DIAGNOSIS — J441 Chronic obstructive pulmonary disease with (acute) exacerbation: Secondary | ICD-10-CM

## 2016-12-27 DIAGNOSIS — J189 Pneumonia, unspecified organism: Secondary | ICD-10-CM

## 2016-12-27 DIAGNOSIS — J9620 Acute and chronic respiratory failure, unspecified whether with hypoxia or hypercapnia: Secondary | ICD-10-CM | POA: Diagnosis present

## 2016-12-27 DIAGNOSIS — R000 Tachycardia, unspecified: Secondary | ICD-10-CM | POA: Diagnosis present

## 2016-12-27 DIAGNOSIS — J9621 Acute and chronic respiratory failure with hypoxia: Secondary | ICD-10-CM

## 2016-12-27 DIAGNOSIS — J1100 Influenza due to unidentified influenza virus with unspecified type of pneumonia: Secondary | ICD-10-CM

## 2016-12-27 LAB — GLUCOSE, CAPILLARY
Glucose-Capillary: 147 mg/dL — ABNORMAL HIGH (ref 65–99)
Glucose-Capillary: 183 mg/dL — ABNORMAL HIGH (ref 65–99)

## 2016-12-27 LAB — CBC WITH DIFFERENTIAL/PLATELET
BASOS PCT: 0 %
Basophils Absolute: 0 10*3/uL (ref 0.0–0.1)
EOS ABS: 0 10*3/uL (ref 0.0–0.7)
Eosinophils Relative: 0 %
HCT: 35.7 % — ABNORMAL LOW (ref 36.0–46.0)
Hemoglobin: 11.9 g/dL — ABNORMAL LOW (ref 12.0–15.0)
Lymphocytes Relative: 3 %
Lymphs Abs: 0.5 10*3/uL — ABNORMAL LOW (ref 0.7–4.0)
MCH: 29.1 pg (ref 26.0–34.0)
MCHC: 33.3 g/dL (ref 30.0–36.0)
MCV: 87.3 fL (ref 78.0–100.0)
MONO ABS: 0.6 10*3/uL (ref 0.1–1.0)
MONOS PCT: 3 %
NEUTROS PCT: 94 %
Neutro Abs: 17.3 10*3/uL — ABNORMAL HIGH (ref 1.7–7.7)
PLATELETS: 202 10*3/uL (ref 150–400)
RBC: 4.09 MIL/uL (ref 3.87–5.11)
RDW: 13.9 % (ref 11.5–15.5)
WBC: 18.5 10*3/uL — ABNORMAL HIGH (ref 4.0–10.5)

## 2016-12-27 LAB — EXPECTORATED SPUTUM ASSESSMENT W GRAM STAIN, RFLX TO RESP C: Special Requests: NORMAL

## 2016-12-27 LAB — BASIC METABOLIC PANEL
Anion gap: 11 (ref 5–15)
BUN: 21 mg/dL — ABNORMAL HIGH (ref 6–20)
CALCIUM: 8.5 mg/dL — AB (ref 8.9–10.3)
CO2: 23 mmol/L (ref 22–32)
CREATININE: 1.08 mg/dL — AB (ref 0.44–1.00)
Chloride: 101 mmol/L (ref 101–111)
GFR, EST AFRICAN AMERICAN: 53 mL/min — AB (ref >60)
GFR, EST NON AFRICAN AMERICAN: 45 mL/min — AB (ref >60)
GLUCOSE: 200 mg/dL — AB (ref 65–99)
Potassium: 3.8 mmol/L (ref 3.5–5.1)
Sodium: 135 mmol/L (ref 135–145)

## 2016-12-27 LAB — PROCALCITONIN: PROCALCITONIN: 0.47 ng/mL

## 2016-12-27 LAB — EXPECTORATED SPUTUM ASSESSMENT W REFEX TO RESP CULTURE

## 2016-12-27 LAB — STREP PNEUMONIAE URINARY ANTIGEN: STREP PNEUMO URINARY ANTIGEN: NEGATIVE

## 2016-12-27 MED ORDER — OSELTAMIVIR PHOSPHATE 30 MG PO CAPS
30.0000 mg | ORAL_CAPSULE | Freq: Two times a day (BID) | ORAL | Status: DC
  Administered 2016-12-27 – 2016-12-28 (×4): 30 mg via ORAL
  Filled 2016-12-27 (×4): qty 1

## 2016-12-27 MED ORDER — METHYLPREDNISOLONE SODIUM SUCC 125 MG IJ SOLR
60.0000 mg | Freq: Two times a day (BID) | INTRAMUSCULAR | Status: DC
  Administered 2016-12-27 – 2016-12-28 (×4): 60 mg via INTRAVENOUS
  Filled 2016-12-27 (×4): qty 2

## 2016-12-27 MED ORDER — ALBUTEROL SULFATE (2.5 MG/3ML) 0.083% IN NEBU
2.5000 mg | INHALATION_SOLUTION | RESPIRATORY_TRACT | Status: DC | PRN
  Administered 2016-12-27: 2.5 mg via RESPIRATORY_TRACT
  Filled 2016-12-27: qty 3

## 2016-12-27 MED ORDER — INSULIN ASPART 100 UNIT/ML ~~LOC~~ SOLN
0.0000 [IU] | Freq: Three times a day (TID) | SUBCUTANEOUS | Status: DC
  Administered 2016-12-27: 1 [IU] via SUBCUTANEOUS
  Administered 2016-12-28: 2 [IU] via SUBCUTANEOUS
  Administered 2016-12-28 – 2016-12-29 (×5): 1 [IU] via SUBCUTANEOUS

## 2016-12-27 MED ORDER — ACETAMINOPHEN 325 MG PO TABS
650.0000 mg | ORAL_TABLET | Freq: Four times a day (QID) | ORAL | Status: DC | PRN
  Administered 2016-12-27 – 2016-12-29 (×3): 650 mg via ORAL
  Filled 2016-12-27 (×4): qty 2

## 2016-12-27 MED ORDER — IPRATROPIUM-ALBUTEROL 0.5-2.5 (3) MG/3ML IN SOLN
3.0000 mL | Freq: Four times a day (QID) | RESPIRATORY_TRACT | Status: DC
  Administered 2016-12-27 – 2016-12-28 (×5): 3 mL via RESPIRATORY_TRACT
  Filled 2016-12-27 (×6): qty 3

## 2016-12-27 NOTE — Progress Notes (Signed)
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 Noted to have low-grade fever, tachycardia, hypoxia despite 5 L oxygen. Admitted for influenza A (? Recurrence Vs persistent PCR in patient completed Rx) and pneumonia.  Assessment & Plan:   Principal Problem:   Influenza with pneumonia Active Problems:   Acute on chronic respiratory failure (HCC)   Tachycardia   Influenza A (previously treated for influenza A) complicated by possible post-influenza healthcare associated pneumonia - Within the last 10 days prior to admission, completed a course of Tamiflu for influenza A. Repeat influenza panel PCR confirms influenza A >recurrence (less likely versus persistent PCR)-discuss with ID regarding need for continued Tamiflu. - presented to ED with low-grade fevers, productive cough and chest x-ray showing bibasal opacities which could be developing pneumonia. - Patient volunteers in the hospital, previously treated for community-acquired pneumonia and recently had wrist surgery. Thereby covering for healthcare associated pneumonia with IV cefepime and vancomycin. - Briefly treated with IV fluids but given history of chronic diastolic CHF, DC IV fluids. - Blood cultures pending.  pro-calcitonin  0.47. Urinary pneumococcal antigen negative. Lactate normal.  - Follow up chest x-ray.   Acute on chronic hypoxic respiratory failure - Secondary to acute bronchitis from influenza, pneumonia and atelectasis complicating underlying COPD and pulmonary fibrosis. Treat underlying cause and titrate oxygen to prior baseline. Improved.   COPD with mild exacerbation  - Precipitated by infectious etiology as above. Continue brief IV steroids and transition to rapid oral prednisone taper.   Chronic diastolic CHF/pulmonary hypertension - Compensated. DC IV fluids.   CAD status post angioplasty - No chest pain. Continue aspirin, statins and beta blockers.  Hyperlipidemia - Statins   Essential hypertension - Controlled. Continue antihypertensives.  Hypothyroid - Continue Synthroid.  Anemia  - Follow CBCs   Leukocytosis - Secondary to infectious etiology and steroids. Follow CBCs  Hyperglycemia - Check A1c. SSI while on steroids.    DVT prophylaxis: Lovenox Code Status: DO NOT RESUSCITATE Family Communication: Discussed in detail with patient's daughter at bedside. Updated care and answered questions.  Disposition Plan: DC home when medically stable, possibly in 3-4 days.    Consultants:   None   Procedures:   None  Antimicrobials:   IV vancomycin  IV cefepime  Oral Tamiflu    Subjective: Feels better. Dyspnea improved. Productive cough-yellow sputum. No chest pain. Still weak. As per RN, no acute issues.   Objective:  Vitals:   12/27/16 0411 12/27/16 0544 12/27/16 0736 12/27/16 0800  BP: (!) 149/55     Pulse: 87     Resp: 20     Temp: 97.5 F (36.4 C)     TempSrc: Oral     SpO2: 95% 92% 92% 90%  Weight:      Height:        Intake/Output Summary (Last 24 hours) at 12/27/16 1140 Last data filed at 12/27/16 1100  Gross per 24 hour  Intake          1488.75 ml  Output                0 ml  Net          1488.75 ml   Filed Weights   12/26/16 2222    Weight: 73.6 kg (162 lb 3.2 oz)    Examination:  General exam: Pleasant elderly female lying comfortably propped up in bed.  Respiratory system: diminished breath sounds in the bases with few basal crackles. Scattered occasional bilateral expiratory rhonchi. Respiratory effort normal. Cardiovascular system: S1 & S2 heard, RRR. No JVD, murmurs, rubs, gallops or clicks. No pedal edema. telemetry: Sinus rhythm.  Gastrointestinal system: Abdomen is nondistended, soft and nontender. No organomegaly or masses felt. Normal bowel sounds heard. Central nervous system: Alert and oriented. No focal neurological deficits. Extremities: Symmetric 5 x 5 power. Skin: No rashes, lesions or ulcers Psychiatry: Judgement and insight appear normal. Mood & affect appropriate.     Data Reviewed: I have personally reviewed following labs and imaging studies  CBC:  Recent Labs Lab 12/26/16 1918 12/27/16 0222  WBC 20.1* 18.5*  NEUTROABS 19.1* 17.3*  HGB 13.7 11.9*  HCT 40.8 35.7*  MCV 87.0 87.3  PLT 233 [id number]   Basic Metabolic Panel:  Recent Labs Lab 12/26/16 1918 12/27/16 0222  NA 135 135  K 3.8 3.8  CL 99* 101  CO2 23 23  GLUCOSE 163* 200*  BUN 17 21*  CREATININE 1.11* 1.08*  CALCIUM 9.1 8.5*   GFR: Estimated Creatinine Clearance: 36.6 mL/min (by C-G formula based on SCr of 1.08 mg/dL (H)). Liver Function Tests:  Recent Labs Lab 12/26/16 1918  AST 22  ALT 16  ALKPHOS 53  BILITOT 1.0  PROT 7.4  ALBUMIN 3.1*    Recent Labs Lab 12/26/16 1918  LIPASE 21   No results for input(s): AMMONIA in the last 168 hours. Coagulation Profile: No results for input(s): INR, PROTIME in the last 168 hours. Cardiac Enzymes: No results for input(s): CKTOTAL, CKMB, CKMBINDEX, TROPONINI in the last 168 hours. BNP (last 3 results) No results for input(s): PROBNP in the last 8760 hours. HbA1C: No results for input(s): HGBA1C in the last 72 hours. CBG: No results for input(s): GLUCAP in the  last 168 hours. Lipid Profile: No results for input(s): CHOL, HDL, LDLCALC, TRIG, CHOLHDL, LDLDIRECT in the last 72 hours. Thyroid Function Tests: No results for input(s): TSH, T4TOTAL, FREET4, T3FREE, THYROIDAB in the last 72 hours. Anemia Panel: No results for input(s): VITAMINB12, FOLATE, FERRITIN, TIBC, IRON, RETICCTPCT in the last 72 hours.  Sepsis Labs:  Recent Labs Lab 12/26/16 1918 12/26/16 1932 12/27/16 0222  PROCALCITON  --   --  0.47  WBC 20.1*  --  18.5*  LATICACIDVEN  --  1.48  --     Recent Results (from the past 240 hour(s))  Culture, blood (routine x 2)     Status: None (Preliminary result)   Collection Time: 12/26/16  7:13 PM  Result Value Ref Range Status   Specimen Description BLOOD LEFT ARM  Final   Special Requests BOTTLES DRAWN AEROBIC ONLY 5CC  Final   Culture PENDING  Incomplete   Report Status PENDING  Incomplete         Radiology Studies: Dg Chest 2 View  Result Date: 12/26/2016 CLINICAL DATA:  Fever and chills EXAM: CHEST  2 VIEW COMPARISON:  Chest radiograph 02/05/2016 FINDINGS: Therapy bibasilar opacities. There  is calcification in the aortic arch. No pleural effusion or pneumothorax. IMPRESSION: Bibasilar opacities, favored indicate atelectasis, though developing consolidation may have the same appearance. Electronically Signed   By: Ulyses Jarred M.D.   On: 12/26/2016 19:09        Scheduled Meds: . arformoterol  15 mcg Nebulization BID  . aspirin  81 mg Oral Daily  . atorvastatin  20 mg Oral QPM  . ceFEPime (MAXIPIME) IV  1 g Intravenous Q24H  . enoxaparin (LOVENOX) injection  40 mg Subcutaneous Q24H  . ipratropium-albuterol  3 mL Nebulization Q6H  . isosorbide mononitrate  30 mg Oral Daily  . levothyroxine  137 mcg Oral QAC breakfast  . methylPREDNISolone (SOLU-MEDROL) injection  60 mg Intravenous Q12H  . metoprolol  50 mg Oral BID  . oseltamivir  30 mg Oral BID  . vancomycin  1,000 mg Intravenous Q24H   Continuous Infusions: .  sodium chloride 75 mL/hr at 12/26/16 2229     LOS: 1 day       [facility], MD [facility] Pager [phone]  If 7PM-7AM, please contact night-coverage [email or web address] Password [email or web address] 12/27/2016, 11:40 AM

## 2016-12-27 NOTE — Consult Note (Signed)
            [facility] CM Primary Care Navigator  12/27/2016  KATE VISSERS 03/14/1929 [id number]   Went to see patient in the room to identify possible discharge needsbut she was fast asleep.  No family member noted in the room.  Will try to meet with patient at another time when awake and available.   For additional questions please contact:  Edwena Felty A. Jaiya Mooradian, BSN, RN-BC [facility] PRIMARY CARE Navigator Cell: [phone]

## 2016-12-27 NOTE — Plan of Care (Signed)
Problem: Activity: Goal: Ability to tolerate increased activity will improve Outcome: Progressing Patient voiced feeling better. Will continue to monitor.

## 2016-12-28 LAB — CBC
HEMATOCRIT: 36 % (ref 36.0–46.0)
HEMOGLOBIN: 11.5 g/dL — AB (ref 12.0–15.0)
MCH: 28.2 pg (ref 26.0–34.0)
MCHC: 31.9 g/dL (ref 30.0–36.0)
MCV: 88.2 fL (ref 78.0–100.0)
Platelets: 189 10*3/uL (ref 150–400)
RBC: 4.08 MIL/uL (ref 3.87–5.11)
RDW: 14 % (ref 11.5–15.5)
WBC: 14.1 10*3/uL — AB (ref 4.0–10.5)

## 2016-12-28 LAB — GLUCOSE, CAPILLARY
GLUCOSE-CAPILLARY: 136 mg/dL — AB (ref 65–99)
Glucose-Capillary: 162 mg/dL — ABNORMAL HIGH (ref 65–99)
Glucose-Capillary: 133 mg/dL — ABNORMAL HIGH (ref 65–99)
Glucose-Capillary: 143 mg/dL — ABNORMAL HIGH (ref 65–99)

## 2016-12-28 MED ORDER — IPRATROPIUM-ALBUTEROL 0.5-2.5 (3) MG/3ML IN SOLN
3.0000 mL | Freq: Four times a day (QID) | RESPIRATORY_TRACT | Status: DC
  Administered 2016-12-28: 3 mL via RESPIRATORY_TRACT
  Filled 2016-12-28 (×2): qty 3

## 2016-12-28 MED ORDER — METHYLPREDNISOLONE SODIUM SUCC 40 MG IJ SOLR
40.0000 mg | Freq: Two times a day (BID) | INTRAMUSCULAR | Status: DC
  Administered 2016-12-28: 40 mg via INTRAVENOUS
  Filled 2016-12-28: qty 1

## 2016-12-28 NOTE — Progress Notes (Signed)
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 Noted to have low-grade fever, tachycardia, hypoxia despite 5 L oxygen. Admitted for influenza A (? Recurrence Vs persistent PCR in patient completed Rx) and pneumonia.  Assessment & Plan:   Principal Problem:   Influenza with pneumonia Active Problems:   Acute on chronic respiratory failure (HCC)   Tachycardia   Influenza A (previously treated for influenza A) complicated by possible post-influenza healthcare associated pneumonia - Within the last 10 days prior to admission, completed a course of Tamiflu for influenza A. Repeat influenza panel PCR confirms influenza A >recurrence (less likely versus persistent PCR)-discussed with ID & discontinued Tamiflu. - presented to ED with low-grade fevers, productive cough and chest x-ray showing bibasal opacities which could be developing pneumonia. - Patient volunteers in the hospital, previously treated for community-acquired pneumonia and recently had wrist surgery. Thereby covering for healthcare associated pneumonia with IV cefepime and vancomycin. - Briefly treated with IV fluids but given history of chronic diastolic CHF, DC IV fluids. - Blood cultures 2: Negative to date.  pro-calcitonin  0.47. Urinary pneumococcal antigen negative. Lactate normal.  - Chest x-ray 12/27/16: Bilateral lower lobe hazy airspace disease concerning for pneumonia. - Improving.  Acute on chronic hypoxic respiratory failure - Secondary to acute bronchitis from influenza, pneumonia and atelectasis complicating underlying COPD and pulmonary fibrosis. Treat underlying cause and titrate oxygen to prior baseline. Improved.  - Currently 95% O2 saturation on 5 L oxygen.  COPD with mild exacerbation  - Precipitated by infectious etiology as above. Continue brief IV steroids and transition to rapid oral prednisone taper.   Chronic diastolic CHF/pulmonary hypertension - Compensated. DC IV fluids.   CAD status post angioplasty - No chest pain. Continue aspirin, statins and beta blockers.  Hyperlipidemia - Statins   Essential hypertension - Controlled. Continue antihypertensives.  Hypothyroid - Continue Synthroid.  Anemia  - Stable.  Leukocytosis - Secondary to infectious etiology and steroids. Improving.  Hyperglycemia - Check A1c. SSI while on steroids.    DVT prophylaxis: Lovenox Code Status: DO NOT RESUSCITATE Family Communication: Discussed in detail with patient's daughter from out of town, at bedside. Updated care and answered questions.  Disposition Plan: DC home when medically stable, possibly in 2-3 days.    Consultants:   None   Procedures:   None  Antimicrobials:   IV vancomycin  IV cefepime  Oral Tamiflu -discontinued   Subjective: Continues to feel better. Dyspnea continues to improve without recurrent worsening. Cough improving in sputum color changed from yellow-green to white. Wants to shower. No other complaints reported.  Objective:  Vitals:   12/27/16 2021 12/27/16 2107 12/28/16 0441 12/28/16 0933  BP:  (!) 151/52 (!) 147/53   Pulse:  (!) 112 89   Resp:  18 20   Temp:  97.6 F (36.4 C) 97.6 F (36.4 C)   TempSrc:  Oral Oral  SpO2: 91% 94% 94% 95%   Weight:      Height:        Intake/Output Summary (Last 24 hours) at 12/28/16 1454 Last data filed at 12/28/16 0900  Gross per 24 hour  Intake              240 ml  Output                0 ml  Net              240 ml   Filed Weights   12/26/16 2222  Weight: 73.6 kg (162 lb 3.2 oz)    Examination:  General exam: Pleasant elderly female lying comfortably propped up in bed.  Respiratory system: Improved breath sounds. Occasional rhonchi posteriorly but fair air entry. Occasional basal crackles. Respiratory effort normal. Cardiovascular system: S1 & S2 heard, RRR. No JVD, murmurs, rubs, gallops or clicks. No pedal edema. telemetry: Sinus rhythm.  Gastrointestinal system: Abdomen is nondistended, soft and nontender. No organomegaly or masses felt. Normal bowel sounds heard. Central nervous system: Alert and oriented. No focal neurological deficits. Extremities: Symmetric 5 x 5 power. Skin: No rashes, lesions or ulcers Psychiatry: Judgement and insight appear normal. Mood & affect appropriate.     Data Reviewed: I have personally reviewed following labs and imaging studies  CBC:  Recent Labs Lab 12/26/16 1918 12/27/16 0222 12/28/16 0553  WBC 20.1* 18.5* 14.1*  NEUTROABS 19.1* 17.3*  --   HGB 13.7 11.9* 11.5*  HCT 40.8 35.7* 36.0  MCV 87.0 87.3 88.2  PLT 233 202 [id number]   Basic Metabolic Panel:  Recent Labs Lab 12/26/16 1918 12/27/16 0222  NA 135 135  K 3.8 3.8  CL 99* 101  CO2 23 23  GLUCOSE 163* 200*  BUN 17 21*  CREATININE 1.11* 1.08*  CALCIUM 9.1 8.5*   GFR: Estimated Creatinine Clearance: 36.6 mL/min (by C-G formula based on SCr of 1.08 mg/dL (H)). Liver Function Tests:  Recent Labs Lab 12/26/16 1918  AST 22  ALT 16  ALKPHOS 53  BILITOT 1.0  PROT 7.4  ALBUMIN 3.1*    Recent Labs Lab 12/26/16 1918  LIPASE 21   No results for input(s): AMMONIA in the last 168 hours. Coagulation Profile: No results for input(s): INR, PROTIME in the last 168  hours. Cardiac Enzymes: No results for input(s): CKTOTAL, CKMB, CKMBINDEX, TROPONINI in the last 168 hours. BNP (last 3 results) No results for input(s): PROBNP in the last 8760 hours. HbA1C: No results for input(s): HGBA1C in the last 72 hours. CBG:  Recent Labs Lab 12/27/16 1651 12/27/16 2105 12/28/16 0524 12/28/16 1140  GLUCAP 147* 183* 162* 136*   Lipid Profile: No results for input(s): CHOL, HDL, LDLCALC, TRIG, CHOLHDL, LDLDIRECT in the last 72 hours. Thyroid Function Tests: No results for input(s): TSH, T4TOTAL, FREET4, T3FREE, THYROIDAB in the last 72 hours. Anemia Panel: No results for input(s): VITAMINB12, FOLATE, FERRITIN, TIBC, IRON, RETICCTPCT in the last 72 hours.  Sepsis Labs:  Recent Labs Lab 12/26/16 1918 12/26/16 1932 12/27/16 0222 12/28/16 0553  PROCALCITON  --   --  0.47  --   WBC 20.1*  --  18.5* 14.1*  LATICACIDVEN  --  1.48  --   --     Recent Results (from the past 240 hour(s))  Culture, blood (routine x 2)     Status: None (Preliminary result)   Collection Time: 12/26/16  7:13 PM  Result Value Ref Range Status   Specimen  Description BLOOD LEFT ARM  Final   Special Requests BOTTLES DRAWN AEROBIC ONLY 5CC  Final   Culture NO GROWTH 2 DAYS  Final   Report Status PENDING  Incomplete  Culture, blood (routine x 2)     Status: None (Preliminary result)   Collection Time: 12/26/16  7:23 PM  Result Value Ref Range Status   Specimen Description BLOOD LEFT HAND  Final   Special Requests IN PEDIATRIC BOTTLE 3CC  Final   Culture NO GROWTH 2 DAYS  Final   Report Status PENDING  Incomplete  Culture, sputum-assessment     Status: None   Collection Time: 12/27/16 12:13 PM  Result Value Ref Range Status   Specimen Description EXPECTORATED SPUTUM  Final   Special Requests Normal  Final   Sputum evaluation THIS SPECIMEN IS ACCEPTABLE FOR SPUTUM CULTURE  Final   Report Status 12/27/2016 FINAL  Final  Culture, respiratory (NON-Expectorated)     Status:  None (Preliminary result)   Collection Time: 12/27/16 12:13 PM  Result Value Ref Range Status   Specimen Description EXPECTORATED SPUTUM  Final   Special Requests Normal Reflexed from [id number]  Final   Gram Stain   Final    ABUNDANT WBC PRESENT, PREDOMINANTLY PMN ABUNDANT GRAM NEGATIVE RODS MODERATE GRAM POSITIVE RODS MODERATE YEAST    Culture CULTURE REINCUBATED FOR BETTER GROWTH  Final   Report Status PENDING  Incomplete         Radiology Studies: Dg Chest 2 View  Result Date: 12/27/2016 CLINICAL DATA:  Cough, congestion EXAM: CHEST  2 VIEW COMPARISON:  12/26/2016 FINDINGS: There is bilateral lower lobe hazy airspace disease. The lungs are hyperinflated likely secondary to COPD. There is no pleural effusion or pneumothorax. The heart and mediastinal contours are stable. The osseous structures are unremarkable. IMPRESSION: 1. Bilateral lower lobe hazy airspace disease concerning for pneumonia. 2. COPD. Electronically Signed   By: Kathreen Devoid   On: 12/27/2016 12:38   Dg Chest 2 View  Result Date: 12/26/2016 CLINICAL DATA:  Fever and chills EXAM: CHEST  2 VIEW COMPARISON:  Chest radiograph 02/05/2016 FINDINGS: Therapy bibasilar opacities. There is calcification in the aortic arch. No pleural effusion or pneumothorax. IMPRESSION: Bibasilar opacities, favored indicate atelectasis, though developing consolidation may have the same appearance. Electronically Signed   By: Ulyses Jarred M.D.   On: 12/26/2016 19:09        Scheduled Meds: . arformoterol  15 mcg Nebulization BID  . aspirin  81 mg Oral Daily  . atorvastatin  20 mg Oral QPM  . ceFEPime (MAXIPIME) IV  1 g Intravenous Q24H  . enoxaparin (LOVENOX) injection  40 mg Subcutaneous Q24H  . insulin aspart  0-9 Units Subcutaneous TID WC  . ipratropium-albuterol  3 mL Nebulization Q6H  . isosorbide mononitrate  30 mg Oral Daily  . levothyroxine  137 mcg Oral QAC breakfast  . methylPREDNISolone (SOLU-MEDROL) injection  60 mg  Intravenous Q12H  . metoprolol  50 mg Oral BID  . vancomycin  1,000 mg Intravenous Q24H   Continuous Infusions:    LOS: 2 days       [facility], MD [facility] Pager [phone]  If 7PM-7AM, please contact night-coverage [email or web address] Password [email or web address] 12/28/2016, 2:54 PM

## 2016-12-29 LAB — HEMOGLOBIN A1C
Hgb A1c MFr Bld: 5 % (ref 4.8–5.6)
Mean Plasma Glucose: 97 mg/dL

## 2016-12-29 LAB — GLUCOSE, CAPILLARY
GLUCOSE-CAPILLARY: 123 mg/dL — AB (ref 65–99)
GLUCOSE-CAPILLARY: 132 mg/dL — AB (ref 65–99)
Glucose-Capillary: 133 mg/dL — ABNORMAL HIGH (ref 65–99)
Glucose-Capillary: 140 mg/dL — ABNORMAL HIGH (ref 65–99)

## 2016-12-29 LAB — PROCALCITONIN: PROCALCITONIN: 0.12 ng/mL

## 2016-12-29 LAB — BASIC METABOLIC PANEL
Anion gap: 9 (ref 5–15)
BUN: 28 mg/dL — ABNORMAL HIGH (ref 6–20)
CHLORIDE: 104 mmol/L (ref 101–111)
CO2: 25 mmol/L (ref 22–32)
CREATININE: 0.84 mg/dL (ref 0.44–1.00)
Calcium: 9.1 mg/dL (ref 8.9–10.3)
Glucose, Bld: 114 mg/dL — ABNORMAL HIGH (ref 65–99)
POTASSIUM: 4.4 mmol/L (ref 3.5–5.1)
SODIUM: 138 mmol/L (ref 135–145)

## 2016-12-29 LAB — CULTURE, RESPIRATORY W GRAM STAIN: Special Requests: NORMAL

## 2016-12-29 LAB — CULTURE, RESPIRATORY

## 2016-12-29 MED ORDER — SENNA 8.6 MG PO TABS
1.0000 | ORAL_TABLET | Freq: Every day | ORAL | Status: DC | PRN
  Administered 2016-12-29: 8.6 mg via ORAL
  Filled 2016-12-29: qty 1

## 2016-12-29 MED ORDER — IPRATROPIUM-ALBUTEROL 0.5-2.5 (3) MG/3ML IN SOLN: 3.0000 mL | RESPIRATORY_TRACT | Status: DC | PRN

## 2016-12-29 MED ORDER — PREDNISONE 20 MG PO TABS
40.0000 mg | ORAL_TABLET | Freq: Every day | ORAL | Status: DC
  Administered 2016-12-29 – 2016-12-30 (×2): 40 mg via ORAL
  Filled 2016-12-29 (×3): qty 2

## 2016-12-29 NOTE — Progress Notes (Signed)
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 Noted to have low-grade fever, tachycardia, hypoxia despite 5 L oxygen. Admitted for influenza A (? Recurrence Vs persistent PCR in patient completed Rx) and pneumonia. Improving. Possible discharge home 12/30/16.  Assessment & Plan:   Principal Problem:   Influenza with pneumonia Active Problems:   Acute on chronic respiratory failure (HCC)   Tachycardia   Influenza A (previously treated for influenza A) complicated by possible post-influenza healthcare associated pneumonia - Within the last 10 days prior to admission, completed a course of Tamiflu for influenza A. Repeat influenza panel PCR confirms influenza A >recurrence (less likely versus persistent PCR)-discussed with ID & discontinued Tamiflu. - presented to ED with low-grade fevers, productive cough and chest x-ray showing bibasal opacities which could be developing pneumonia. - Patient volunteers in the hospital, previously treated for community-acquired pneumonia and recently had wrist surgery. Thereby covering for healthcare associated pneumonia with IV cefepime and vancomycin. - Briefly treated with IV fluids but given history of chronic diastolic CHF, DC IV fluids. - Blood  cultures 2: Negative to date.  pro-calcitonin 0.47. Urinary pneumococcal antigen negative. Lactate normal.  - Chest x-ray 12/27/16: Bilateral lower lobe hazy airspace disease concerning for pneumonia. - Improving. Day 3 IV antibiotics.  Acute on chronic hypoxic respiratory failure - Secondary to acute bronchitis from influenza, pneumonia and atelectasis complicating underlying COPD and pulmonary fibrosis. Treat underlying cause and titrate oxygen to prior baseline. Improved.  - Saturating well on home dose of oxygen 5 L/m.  COPD with mild exacerbation  - Precipitated by infectious etiology as above. Treated briefly with IV steroids. Bronchospasm much improved. Transitioned to oral prednisone.  Chronic diastolic CHF/pulmonary hypertension - Compensated. DC IV fluids.   CAD status post angioplasty - No chest pain. Continue aspirin, statins and beta blockers.  Hyperlipidemia - Statins   Essential hypertension - Controlled. Continue antihypertensives.  Hypothyroid - Continue Synthroid.  Anemia  - Stable.  Leukocytosis - Secondary to infectious etiology and steroids. Improving.  Hyperglycemia - Check A1c-pending. SSI while on steroids.    DVT prophylaxis: Lovenox Code Status: DO NOT RESUSCITATE Family Communication:  Disposition Plan: DC home when medically stable, possibly 12/30/16. Home health PT as per PT recommendations.    Consultants:   None   Procedures:   None  Antimicrobials:   IV vancomycin  IV cefepime  Oral Tamiflu -discontinued   Subjective: Breathing almost at baseline. Cough with difficult to bring up thick sputum-white in color. Weakness improving. Appetite improving. Had a BM 3 days prior to admission but no abdominal pain, nausea or vomiting.  Objective:  Vitals:   12/28/16 1502 12/28/16 2008 12/28/16 2200 12/29/16 0440  BP:  (!) 166/56  (!) 159/58  Pulse:  80  80  Resp:  18  18  Temp:  97.7 F (36.5 C)  98.1 F (36.7 C)  TempSrc:   Oral  Oral  SpO2: 95% 93% 94% 91%  Weight:      Height:        Intake/Output Summary (Last 24 hours) at 12/29/16 1014 Last data filed at 12/28/16 1500  Gross per 24 hour  Intake              490 ml  Output                0 ml  Net              490 ml   Filed Weights   12/26/16 2222  Weight: 73.6 kg (162 lb 3.2 oz)    Examination:  General exam: Pleasant elderly female sitting up comfortably at edge of bed. Patient's nursing at bedside. Respiratory system: Occasional basal crackles. Otherwise clear to auscultation without wheezing or rhonchi. Respiratory effort normal. Cardiovascular system: S1 & S2 heard, RRR. No JVD, murmurs, rubs, gallops or clicks. No pedal edema. Telemetry: Sinus rhythm.  Gastrointestinal system: Abdomen is nondistended, soft and nontender. No organomegaly or masses felt. Normal bowel sounds heard. Central nervous system: Alert and oriented. No focal neurological deficits. Extremities: Symmetric 5 x 5 power. Skin: No rashes, lesions or ulcers Psychiatry: Judgement and insight appear normal. Mood & affect appropriate.     Data Reviewed: I have personally reviewed following labs and imaging studies  CBC:  Recent Labs Lab 12/26/16 1918 12/27/16 0222 12/28/16 0553  WBC 20.1* 18.5* 14.1*  NEUTROABS 19.1* 17.3*  --   HGB 13.7 11.9* 11.5*  HCT 40.8 35.7* 36.0  MCV 87.0 87.3 88.2  PLT 233 202 [id number]   Basic Metabolic Panel:  Recent Labs Lab 12/26/16 1918 12/27/16 0222 12/29/16 0757  NA 135 135 138  K 3.8 3.8 4.4  CL 99* 101 104  CO2 23 23 25   GLUCOSE 163* 200* 114*  BUN 17 21* 28*  CREATININE 1.11* 1.08* 0.84  CALCIUM 9.1 8.5* 9.1   GFR: Estimated Creatinine Clearance: 47.1 mL/min (by C-G formula based on SCr of 0.84 mg/dL). Liver Function Tests:  Recent Labs Lab 12/26/16 1918  AST 22  ALT 16  ALKPHOS 53  BILITOT 1.0  PROT 7.4  ALBUMIN 3.1*    Recent Labs Lab 12/26/16 1918  LIPASE 21   No results for input(s): AMMONIA in the  last 168 hours. Coagulation Profile: No results for input(s): INR, PROTIME in the last 168 hours. Cardiac Enzymes: No results for input(s): CKTOTAL, CKMB, CKMBINDEX, TROPONINI in the last 168 hours. BNP (last 3 results) No results for input(s): PROBNP in the last 8760 hours. HbA1C: No results for input(s): HGBA1C in the last 72 hours. CBG:  Recent Labs Lab 12/28/16 0524 12/28/16 1140 12/28/16 1857 12/28/16 2115 12/29/16 0619  GLUCAP 162* 136* 133* 143* 133*   Lipid Profile: No results for input(s): CHOL, HDL, LDLCALC, TRIG, CHOLHDL, LDLDIRECT in the last 72 hours. Thyroid Function Tests: No results for input(s): TSH, T4TOTAL, FREET4, T3FREE, THYROIDAB in the last 72 hours. Anemia Panel: No results for input(s): VITAMINB12, FOLATE, FERRITIN, TIBC, IRON, RETICCTPCT in the last 72 hours.  Sepsis Labs:  Recent Labs Lab 12/26/16 1918 12/26/16 1932 12/27/16 0222 12/28/16 0553  PROCALCITON  --   --  0.47  --   WBC 20.1*  --  18.5* 14.1*  LATICACIDVEN  --  1.48  --   --     Recent Results (from the past 240 hour(s))  Culture, blood (routine x 2)     Status: None (Preliminary result)  Collection Time: 12/26/16  7:13 PM  Result Value Ref Range Status   Specimen Description BLOOD LEFT ARM  Final   Special Requests BOTTLES DRAWN AEROBIC ONLY 5CC  Final   Culture NO GROWTH 2 DAYS  Final   Report Status PENDING  Incomplete  Culture, blood (routine x 2)     Status: None (Preliminary result)   Collection Time: 12/26/16  7:23 PM  Result Value Ref Range Status   Specimen Description BLOOD LEFT HAND  Final   Special Requests IN PEDIATRIC BOTTLE 3CC  Final   Culture NO GROWTH 2 DAYS  Final   Report Status PENDING  Incomplete  Culture, sputum-assessment     Status: None   Collection Time: 12/27/16 12:13 PM  Result Value Ref Range Status   Specimen Description EXPECTORATED SPUTUM  Final   Special Requests Normal  Final   Sputum evaluation THIS SPECIMEN IS ACCEPTABLE FOR SPUTUM  CULTURE  Final   Report Status 12/27/2016 FINAL  Final  Culture, respiratory (NON-Expectorated)     Status: None (Preliminary result)   Collection Time: 12/27/16 12:13 PM  Result Value Ref Range Status   Specimen Description EXPECTORATED SPUTUM  Final   Special Requests Normal Reflexed from [id number]  Final   Gram Stain   Final    ABUNDANT WBC PRESENT, PREDOMINANTLY PMN ABUNDANT GRAM NEGATIVE RODS MODERATE GRAM POSITIVE RODS MODERATE YEAST    Culture CULTURE REINCUBATED FOR BETTER GROWTH  Final   Report Status PENDING  Incomplete         Radiology Studies: Dg Chest 2 View  Result Date: 12/27/2016 CLINICAL DATA:  Cough, congestion EXAM: CHEST  2 VIEW COMPARISON:  12/26/2016 FINDINGS: There is bilateral lower lobe hazy airspace disease. The lungs are hyperinflated likely secondary to COPD. There is no pleural effusion or pneumothorax. The heart and mediastinal contours are stable. The osseous structures are unremarkable. IMPRESSION: 1. Bilateral lower lobe hazy airspace disease concerning for pneumonia. 2. COPD. Electronically Signed   By: Kathreen Devoid   On: 12/27/2016 12:38        Scheduled Meds: . arformoterol  15 mcg Nebulization BID  . aspirin  81 mg Oral Daily  . atorvastatin  20 mg Oral QPM  . ceFEPime (MAXIPIME) IV  1 g Intravenous Q24H  . enoxaparin (LOVENOX) injection  40 mg Subcutaneous Q24H  . insulin aspart  0-9 Units Subcutaneous TID WC  . ipratropium-albuterol  3 mL Nebulization Q6H WA  . isosorbide mononitrate  30 mg Oral Daily  . levothyroxine  137 mcg Oral QAC breakfast  . metoprolol  50 mg Oral BID  . predniSONE  40 mg Oral Q breakfast  . vancomycin  1,000 mg Intravenous Q24H   Continuous Infusions:    LOS: 3 days       [facility], MD [facility] Pager [phone]  If 7PM-7AM, please contact night-coverage [email or web address] Password [email or web address] 12/29/2016, 10:14 AM

## 2016-12-29 NOTE — Evaluation (Signed)
Physical Therapy Evaluation Patient Details Name: [name] MRN: [id number] DOB: 12/08/1928 Today's Date: 12/29/2016   History of Present Illness  Pt is an 83 YO female admitted on 12/27/15 with dyspnea and tachycardia. Pt was dx with pneumonia related to influenza with acute respiratory failure. PMH signficant for pulmonary fibrosis, CHF, CAD, HLD, HTN.  Clinical Impression  Pt presents with the above diagnosis and below deficits. Prior to admission, pt lived alone in a single level home with 3 steps leading into the house. Pt was driving herself to outpatient therapy visits and volunteering 2 days a week here at [facility]. Pt is on home O2 at 2-5L via [place] and had a fall 2 weeks prior to admission over O2 tubing. Pt is Min guard to supervision for transfers and Min guard to Min A with gait without an AD with O2 in place. Pt advised to use RW at home in order to improved endurance and reduce falls risks. Pt will benefit from continuing to be seen acutely in order to address below deficits and will benefit from HHPT upon discharge in order to maximize her functional outcomes.     Follow Up Recommendations Home health PT;Supervision - Intermittent    Equipment Recommendations  None recommended by PT    Recommendations for Other Services       Precautions / Restrictions Precautions Precautions: Fall Precaution Comments: Fell over O2 tubing and fractured L wrist. Needs RW for stability Restrictions Weight Bearing Restrictions: No      Mobility  Bed Mobility Overal bed mobility: Modified Independent             General bed mobility comments: Sitting EOB when PT arrives  Transfers Overall transfer level: Needs assistance Equipment used: None Transfers: Sit to/from Stand Sit to Stand: Min guard         General transfer comment: Min gaurd for safety from EOB.   Ambulation/Gait Ambulation/Gait assistance: Min guard;Min assist Ambulation Distance (Feet): 150 Feet Assistive  device: None Gait Pattern/deviations: Drifts right/left;Wide base of support Gait velocity: decreased Gait velocity interpretation: Below normal speed for age/gender General Gait Details: Pt had decreased step length bilaterally, drifts to the right with increased distance. Has LOB x 1 requiring min A to stabilize. Pt instructed to use RW for mobility at home. Pt ambulates with 4L o2 via [place] and O2 sats drop to 88% and quickly increase to 90% with activity.    Stairs            Wheelchair Mobility    Modified Rankin (Stroke Patients Only)       Balance Overall balance assessment: History of Falls                                           Pertinent Vitals/Pain Pain Assessment: 0-10 Pain Score: 1  Pain Location: low back L4-L5 compression fracture.  Pain Descriptors / Indicators: Dull Pain Intervention(s): Limited activity within patient's tolerance;Monitored during session;Premedicated before session;Repositioned    Home Living Family/patient expects to be discharged to:: Private residence Living Arrangements: Alone Available Help at Discharge: Family;Available PRN/intermittently Type of Home: House Home Access: Stairs to enter Entrance Stairs-Rails: Right;Left;Can reach both Entrance Stairs-Number of Steps: 3 Home Layout: One level Home Equipment: Walker - 2 wheels;Cane - single point;Bedside commode;Shower seat - built in;Grab bars - toilet;Grab bars - tub/shower;Adaptive equipment Additional Comments: daughter available PRn  Prior Function Level of Independence: Independent         Comments: was completely independent including driving herself to outpatient therapy. 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 Was swimmnig before she got placed on oxygen.      Hand Dominance   Dominant Hand: Right    Extremity/Trunk Assessment   Upper Extremity Assessment Upper Extremity Assessment: Overall WFL for tasks assessed    Lower Extremity  Assessment Lower Extremity Assessment: Generalized weakness       Communication   Communication: No difficulties  Cognition Arousal/Alertness: Awake/alert Behavior During Therapy: WFL for tasks assessed/performed Overall Cognitive Status: Within Functional Limits for tasks assessed                      General Comments      Exercises     Assessment/Plan    PT Assessment Patient needs continued PT services  PT Problem List Decreased activity tolerance;Decreased balance;Decreased mobility          PT Treatment Interventions DME instruction;Gait training;Stair training;Functional mobility training;Therapeutic activities;Therapeutic exercise;Balance training    PT Goals (Current goals can be found in the Care Plan section)  Acute Rehab PT Goals Patient Stated Goal: to go home PT Goal Formulation: With patient Time For Goal Achievement: 01/05/17 Potential to Achieve Goals: Good    Frequency Min 3X/week   Barriers to discharge        Co-evaluation               End of Session Equipment Utilized During Treatment: Gait belt;Oxygen Activity Tolerance: Patient tolerated treatment well Patient left: in chair;with call bell/phone within reach;with nursing/sitter in room Nurse Communication: Mobility status         Time: [id number] PT Time Calculation (min) (ACUTE ONLY): 37 min   Charges:   PT Evaluation $PT Eval Moderate Complexity: 1 Procedure PT Treatments $Gait Training: 8-22 mins   PT G Codes:        Scheryl Marten PT, DPT  [phone]  12/29/2016, 9:28 AM

## 2016-12-29 NOTE — Progress Notes (Addendum)
Pharmacy Antibiotic Note  [name] is a 83 y.o. female admitted on 12/26/2016 with pneumonia. Pt reports recent diagnosis of flu treated with Tamiflu - d/c'd this admit per MD discussion with ID. Pharmacy has been consulted for vancomycin and cefepime dosing - day #4. Afebrile, wbc down 14.1, LA 1.48, PCT 0.47. SCr trend down, CrCl~47.  Plan: -Vancomycin 1000mg  IV q24h - d/c soon? -Cefepime 1g IV q24h -Monitor renal function, cultures, LOT, vancomycin level soon if continuing   Height: 5\' 3"  (160 cm) Weight: 162 lb 3.2 oz (73.6 kg) IBW/kg (Calculated) : 52.4  Temp (24hrs), Avg:98 F (36.7 C), Min:97.7 F (36.5 C), Max:98.2 F (36.8 C)   Recent Labs Lab 12/26/16 1918 12/26/16 1932 12/27/16 0222 12/28/16 0553 12/29/16 0757  WBC 20.1*  --  18.5* 14.1*  --   CREATININE 1.11*  --  1.08*  --  0.84  LATICACIDVEN  --  1.48  --   --   --     Estimated Creatinine Clearance: 47.1 mL/min (by C-G formula based on SCr of 0.84 mg/dL).    No Known Allergies  Antimicrobials this admission: 2/8 Cefepime >>  2/8 Vancomycin >>   Dose adjustments this admission: none  Microbiology results: 2/8 BCx: NGTD 2/9 sputum:  pending 2/9 strep pneumo: neg  Elicia Lamp, PharmD, BCPS Clinical Pharmacist 12/29/2016 11:44 AM

## 2016-12-29 NOTE — Care Management Note (Signed)
Case Management Note Marvetta Gibbons RN, BSN Unit 2W-Case Manager [phone]  Patient Details  Name: [name] MRN: [id number] Date of Birth: 1929-01-04  Subjective/Objective:  Pt admitted with the FLU                  Action/Plan: PTA pt lived at home- has home 02, per PT eval recommendation for HHPT- order placed- spoke with pt and her children at the bedside- choice offered for [facility] agency in [facility]- per pt she has no preference for [facility] agency- ok with using any as long as they can provide the service needed- referral called to Dian Situ with [facility] who accepted referral for HHPT- pt ok with using [facility] for HHPT.   Expected Discharge Date:  12/30/16               Expected Discharge Plan:  [place]  In-House Referral:     Discharge planning Services  CM Consult  Post Acute Care Choice:  Home Health Choice offered to:  Patient, Adult Children  DME Arranged:    DME Agency:     HH Arranged:  PT [place]:  [place]  Status of Service:  Completed, signed off  If discussed at [place] of Stay Meetings, dates discussed:    Discharge Disposition: home with home health   Additional Comments:  Dawayne Patricia, RN 12/29/2016, 2:37 PM

## 2016-12-30 LAB — GLUCOSE, CAPILLARY
Glucose-Capillary: 97 mg/dL (ref 65–99)
Glucose-Capillary: 96 mg/dL (ref 65–99)

## 2016-12-30 MED ORDER — DEXTROSE 5 % IV SOLN
1.0000 g | Freq: Once | INTRAVENOUS | Status: AC
  Administered 2016-12-30: 1 g via INTRAVENOUS
  Filled 2016-12-30: qty 1

## 2016-12-30 MED ORDER — ALBUTEROL SULFATE HFA 108 (90 BASE) MCG/ACT IN AERS: 2.0000 | INHALATION_SPRAY | Freq: Four times a day (QID) | RESPIRATORY_TRACT | 0 refills | Status: DC | PRN

## 2016-12-30 MED ORDER — CLOBETASOL PROPIONATE 0.05 % EX CREA: 1.0000 "application " | TOPICAL_CREAM | CUTANEOUS | Status: DC

## 2016-12-30 MED ORDER — PREDNISONE 10 MG PO TABS: ORAL_TABLET | ORAL | 0 refills | Status: DC

## 2016-12-30 NOTE — Plan of Care (Signed)
Problem: Education: Goal: Knowledge of disease or condition will improve Outcome: Progressing Patient voiced understanding her diagnosis. Will continue to monitor.

## 2016-12-30 NOTE — Discharge Summary (Signed)
Physician Discharge Summary  [name] [id number] DOB: 1928-11-29  PCP: Jerlyn Ly, MD  Admit date: 12/26/2016 Discharge date: 12/30/2016  Recommendations for Outpatient Follow-up:  1. Dr. Carlynn Herald, PCP in 3 days with repeat labs (CBC & BMP). Please follow final results of blood cultures that were sent from the hospital. 2. Recommend repeating chest x-ray in 3-4 weeks to ensure resolution of pneumonia findings.  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 Noted to have low-grade fever, tachycardia, hypoxia despite 5 L oxygen. Admitted for influenza A (? Recurrence Vs persistent PCR in patient completed Rx) and pneumonia. Improved.    Assessment & Plan:   Influenza A (previously treated for influenza A) complicated by possible post-influenza healthcare associated pneumonia - Within the last 10 days prior to admission, completed a course of Tamiflu for influenza A. Repeat influenza panel PCR confirms influenza A >recurrence (less likely versus persistent  PCR)-discussed with ID & discontinued Tamiflu. - presented to ED with low-grade fevers, productive cough and chest x-ray showing bibasal opacities which could be developing pneumonia. - Patient volunteers in the hospital, previously treated for community-acquired pneumonia and recently had wrist surgery. Thereby covering for healthcare associated pneumonia with IV cefepime and vancomycin. - Briefly treated with IV fluids but given history of chronic diastolic CHF, DC IV fluids. - Blood cultures 2: Negative to date.  pro-calcitonin 0.47. Urinary pneumococcal antigen negative. Lactate normal.  - Chest x-ray 12/27/16: Bilateral lower lobe hazy airspace disease concerning for pneumonia. - Improved. Discussed with ID M.D. on call today who recommended completing total of 5 days of IV cefepime today prior to discharge and then no further antibiotics at discharge. Recommend repeating chest x-ray in 3-4 weeks to ensure resolution of pneumonia findings.  Acute on chronic hypoxic respiratory failure - Secondary to acute bronchitis from influenza, pneumonia and atelectasis complicating underlying COPD and pulmonary fibrosis. Treat underlying cause and titrate oxygen to prior baseline. Improved.  - Saturating well on home dose of oxygen 5 L/m.  COPD with mild exacerbation  - Precipitated by infectious etiology as above. Treated briefly with IV steroids. Bronchospasm much improved. Transitioned to oral prednisone-complete rapid taper as outpatient.  Chronic diastolic CHF/pulmonary hypertension - Compensated. DC IV fluids. Continue home dose of Lasix.  CAD status post angioplasty - No chest pain. Continue aspirin, statins and beta blockers.  Hyperlipidemia - Statins   Essential hypertension - Controlled. Continue antihypertensives.  Hypothyroid - Continue Synthroid.  Anemia  - Stable.  Leukocytosis - Secondary to infectious etiology and steroids. Improving.  Hyperglycemia - Hemoglobin  A1c: 5    Consultants:   None   Procedures:   None   Discharge Instructions  Discharge Instructions    (  HEART FAILURE PATIENTS) Call MD:  Anytime you have any of the following symptoms: 1) 3 pound weight gain in 24 hours or 5 pounds in 1 week 2) shortness of breath, with or without a dry hacking cough 3) swelling in the hands, feet or stomach 4) if you have to sleep on extra pillows at night in order to breathe.    Complete by:  As directed    Call MD for:  difficulty breathing, headache or visual disturbances    Complete by:  As directed    Call MD for:  extreme fatigue    Complete by:  As directed    Call MD for:  persistant dizziness or light-headedness    Complete by:  As directed    Call MD for:  redness, tenderness, or signs of infection (pain, swelling, redness, odor or green/yellow discharge around incision site)    Complete by:  As directed    Call MD for:  severe uncontrolled pain    Complete by:  As directed    Call MD for:  temperature >100.4    Complete by:  As directed    Diet - low sodium heart healthy    Complete by:  As directed    Discharge instructions    Complete by:  As directed    Continue prior home oxygen at 4-5 L/m via nasal cannula.   Increase activity slowly    Complete by:  As directed        Medication List    STOP taking these medications   azithromycin 500 MG tablet Commonly known as:  ZITHROMAX   oseltamivir 75 MG capsule Commonly known as:  TAMIFLU   predniSONE 5 MG (21) Tbpk tablet Commonly known as:  STERAPRED UNI-PAK 21 TAB Replaced by:  predniSONE 10 MG tablet     TAKE these medications   acetaminophen 500 MG tablet Commonly known as:  TYLENOL Take 1,000 mg by mouth every 6 (six) hours as needed.   albuterol 108 (90 Base) MCG/ACT inhaler Commonly known as:  PROVENTIL HFA;VENTOLIN HFA Inhale 2 puffs into the lungs every 6 (six) hours as needed for wheezing or shortness of breath.   aspirin 81 MG tablet Take 81 mg  by mouth daily.   atorvastatin 40 MG tablet Commonly known as:  LIPITOR Take 20 mg by mouth every evening.   AYR SALINE NASAL GEL NA Place 1 application into the nose daily as needed (dryness).   B-12 500 MCG Tabs Take 500 mcg by mouth daily.   clobetasol cream 0.05 % Commonly known as:  TEMOVATE Apply 1 application topically once a week.   Co Q 10 100 MG Caps Take 100 mg by mouth daily.   denosumab 60 MG/ML Soln injection Commonly known as:  PROLIA Inject 60 mg into the skin every 6 (six) months. Administer in upper arm, thigh, or abdomen   Fish Oil 1200 MG Caps Take 1,200 mg by mouth 2 (two) times daily.   folic acid A999333 MCG tablet Commonly known as:  FOLVITE Take 400 mcg by mouth every evening.   furosemide 20 MG tablet Commonly known as:  LASIX TAKE 1 TABLET(20 MG) BY MOUTH DAILY What changed:  See the new instructions.   isosorbide mononitrate 30 MG 24 hr tablet Commonly known as:  IMDUR Take 1 tablet (30 mg total) by mouth daily.   levothyroxine 137 MCG tablet Commonly known as:  SYNTHROID, LEVOTHROID Take 137 mcg by mouth daily.   LUBRICATING EYE DROPS OP  Apply 1 drop to eye daily as needed (dry eyes).   metoprolol 50 MG tablet Commonly known as:  LOPRESSOR Take 50 mg by mouth 2 (two) times daily.   multivitamin with minerals Tabs tablet Take 1 tablet by mouth daily.   multivitamin-lutein Caps capsule Take 1 capsule by mouth daily.   nitroGLYCERIN 0.4 MG SL tablet Commonly known as:  NITROSTAT Place 0.4 mg under the tongue every 5 (five) minutes as needed for chest pain (x 3 pills).   NONFORMULARY OR COMPOUNDED ITEM Estradiol 0000000 mg [id number] applicators s: insert vaginally twice weekly.   predniSONE 10 MG tablet Commonly known as:  DELTASONE Take 4 tabs daily for 2 days, then 3 tabs daily for 2 days, then 2 tabs daily for 2 days, then 1 tab daily for 2 days, then stop. Start taking on:  12/31/2016 Replaces:  predniSONE 5 MG (21) Tbpk tablet    umeclidinium-vilanterol 62.5-25 MCG/INH Aepb Commonly known as:  ANORO ELLIPTA Inhale 1 puff into the lungs daily.   Vitamin D3 2000 units capsule Take 2,000 Units by mouth daily.   VITAMIN E PO Take 500 Units by mouth daily.      Follow-up Information    [facility] Follow up.   Specialty:  [place] Why:  HHPT arranged- they will call you to set up home visits Contact information: [place] [address] [phone]        [facility] A, MD Follow up in 3 day(s).   Specialty:  Internal Medicine Why:  To be seen with repeat labs (CBC & BMP). Contact information: [address] [place] [address] [phone]          No Known Allergies    Procedures/Studies: Dg Chest 2 View  Result Date: 12/27/2016 CLINICAL DATA:  Cough, congestion EXAM: CHEST  2 VIEW COMPARISON:  12/26/2016 FINDINGS: There is bilateral lower lobe hazy airspace disease. The lungs are hyperinflated likely secondary to COPD. There is no pleural effusion or pneumothorax. The heart and mediastinal contours are stable. The osseous structures are unremarkable. IMPRESSION: 1. Bilateral lower lobe hazy airspace disease concerning for pneumonia. 2. COPD. Electronically Signed   By: Kathreen Devoid   On: 12/27/2016 12:38   Dg Chest 2 View  Result Date: 12/26/2016 CLINICAL DATA:  Fever and chills EXAM: CHEST  2 VIEW COMPARISON:  Chest radiograph 02/05/2016 FINDINGS: Therapy bibasilar opacities. There is calcification in the aortic arch. No pleural effusion or pneumothorax. IMPRESSION: Bibasilar opacities, favored indicate atelectasis, though developing consolidation may have the same appearance. Electronically Signed   By: Ulyses Jarred M.D.   On: 12/26/2016 19:09      Subjective: Feels great. Wants to go home. Breathing back to baseline. Cough minimal, intermittent and dry. Developed 2 swellings over the dorsum of her right hand and distal wrist-at  sites of IV access and following BP cuff inflation. Reviewed these and advised that they were small hematomas, not actively bleeding or worsening, should resolve spontaneously but advised to seek immediate attention if worsening. She verbalized understanding.  Discharge Exam:  Vitals:   12/29/16 2019 12/29/16 2027 12/30/16 0435 12/30/16 0729  BP: (!) 177/72  (!) 151/51   Pulse: 73  72   Resp: 18  17   Temp: 97.6 F (36.4 C)  98.1 F (36.7 C)   TempSrc: Oral  Oral   SpO2: 94% 95% 94% 95%  Weight:      Height:  General exam: Pleasant elderly female sitting up comfortably in chair without distress. Respiratory system: Occasional basal crackles. Otherwise clear to auscultation without wheezing or rhonchi. Respiratory effort normal. Cardiovascular system: S1 & S2 heard, RRR. No JVD, murmurs, rubs, gallops or clicks. No pedal edema.  Gastrointestinal system: Abdomen is nondistended, soft and nontender. No organomegaly or masses felt. Normal bowel sounds heard. Central nervous system: Alert and oriented. No focal neurological deficits. Extremities: Symmetric 5 x 5 power. Approximately 2 cm diameter hematoma noted on dorsum of right hand and dorsum of the distal right forearm without any other acute findings. Skin: No rashes, lesions or ulcers Psychiatry: Judgement and insight appear normal. Mood & affect appropriate.     The results of significant diagnostics from this hospitalization (including imaging, microbiology, ancillary and laboratory) are listed below for reference.     Microbiology: Recent Results (from the past 240 hour(s))  Culture, blood (routine x 2)     Status: None (Preliminary result)   Collection Time: 12/26/16  7:13 PM  Result Value Ref Range Status   Specimen Description BLOOD LEFT ARM  Final   Special Requests BOTTLES DRAWN AEROBIC ONLY 5CC  Final   Culture NO GROWTH 4 DAYS  Final   Report Status PENDING  Incomplete  Culture, blood (routine x 2)      Status: None (Preliminary result)   Collection Time: 12/26/16  7:23 PM  Result Value Ref Range Status   Specimen Description BLOOD LEFT HAND  Final   Special Requests IN PEDIATRIC BOTTLE 3CC  Final   Culture NO GROWTH 4 DAYS  Final   Report Status PENDING  Incomplete  Culture, sputum-assessment     Status: None   Collection Time: 12/27/16 12:13 PM  Result Value Ref Range Status   Specimen Description EXPECTORATED SPUTUM  Final   Special Requests Normal  Final   Sputum evaluation THIS SPECIMEN IS ACCEPTABLE FOR SPUTUM CULTURE  Final   Report Status 12/27/2016 FINAL  Final  Culture, respiratory (NON-Expectorated)     Status: None   Collection Time: 12/27/16 12:13 PM  Result Value Ref Range Status   Specimen Description EXPECTORATED SPUTUM  Final   Special Requests Normal Reflexed from [id number]  Final   Gram Stain   Final    ABUNDANT WBC PRESENT, PREDOMINANTLY PMN ABUNDANT GRAM NEGATIVE RODS MODERATE GRAM POSITIVE RODS MODERATE YEAST    Culture ABUNDANT CANDIDA TROPICALIS  Final   Report Status 12/29/2016 FINAL  Final     Labs:  Basic Metabolic Panel:  Recent Labs Lab 12/26/16 1918 12/27/16 0222 12/29/16 0757  NA 135 135 138  K 3.8 3.8 4.4  CL 99* 101 104  CO2 23 23 25   GLUCOSE 163* 200* 114*  BUN 17 21* 28*  CREATININE 1.11* 1.08* 0.84  CALCIUM 9.1 8.5* 9.1   Liver Function Tests:  Recent Labs Lab 12/26/16 1918  AST 22  ALT 16  ALKPHOS 53  BILITOT 1.0  PROT 7.4  ALBUMIN 3.1*    Recent Labs Lab 12/26/16 1918  LIPASE 21   CBC:  Recent Labs Lab 12/26/16 1918 12/27/16 0222 12/28/16 0553  WBC 20.1* 18.5* 14.1*  NEUTROABS 19.1* 17.3*  --   HGB 13.7 11.9* 11.5*  HCT 40.8 35.7* 36.0  MCV 87.0 87.3 88.2  PLT 233 202 189    CBG:  Recent Labs Lab 12/29/16 1104 12/29/16 1706 12/29/16 2133 12/30/16 0624 12/30/16 1145  GLUCAP 123* 140* 132* 97 96   Hgb A1c  Recent Labs  12/28/16  0553  HGBA1C 5.0   Urinalysis    Component Value  Date/Time   COLORURINE AMBER (A) 12/26/2016 2240   APPEARANCEUR HAZY (A) 12/26/2016 2240   LABSPEC 1.025 12/26/2016 2240   PHURINE 5.0 12/26/2016 2240   GLUCOSEU NEGATIVE 12/26/2016 2240   HGBUR SMALL (A) 12/26/2016 2240   BILIRUBINUR NEGATIVE 12/26/2016 2240   KETONESUR 5 (A) 12/26/2016 2240   PROTEINUR 100 (A) 12/26/2016 2240   UROBILINOGEN 0.2 05/20/2013 0934   NITRITE NEGATIVE 12/26/2016 2240   LEUKOCYTESUR SMALL (A) 12/26/2016 2240      Time coordinating discharge: Over 30 minutes  SIGNED:  Vernell Leep, MD, FACP, FHM. [facility] Pager [phone]  If 7PM-7AM, please contact night-coverage [email or web address] Password [facility] 12/30/2016, 2:09 PM

## 2016-12-30 NOTE — Progress Notes (Signed)
Pt being discharged home via wheelchair with family. Pt alert and oriented x4. VSS. Pt c/o no pain at this time. Pt discharged with 4L/[place] and pt wears this at home. No signs of respiratory distress. Education complete and care plans resolved. IV removed with catheter intact and pt tolerated well. No further issues at this time. Pt to follow up with PCP. Leanne Chang, RN

## 2016-12-30 NOTE — Discharge Instructions (Signed)
Healthcare-Associated Pneumonia °Healthcare-associated pneumonia is a lung infection that a person can get when in a health care setting or during certain procedures. The infection causes air sacs inside the lungs to fill with pus or fluid. °Healthcare-associated pneumonia is usually caused by bacteria that are common in health care settings. These bacteria may be resistant to some antibiotic medicines. °What are the causes? °This condition is caused by bacteria that get into your lungs. You can get this condition if you: °· Breathe in droplets from an infected person's cough or sneeze. °· Touch something that an infected person coughed or sneezed on and then touch your mouth, nose, or eyes. °· Have a bacterial infection somewhere else in your body, if the bacteria spread to your lungs through your blood. °What increases the risk? °This condition is more likely to develop in people who: °· Have a disease that weakens their body's defense system (immune system) or their ability to cough out germs. °· Are older than age 65. °· Having trouble swallowing. °· Use a feeding or breathing tube. °· Have a cold or the flu. °· Have an IV tube inserted in a vein. °· Have surgery. °· Have a bed sore. °· Live in a long-term care facility, such as a nursing home. °· Were in the hospital for two or more days in the past 3 months. °· Received hemodialysis in the past 30 days. °What are the signs or symptoms? °Symptoms of this condition include: °· Fever. °· Chills. °· Cough. °· Shortness of breath. °· Wheezing or crackling sounds when breathing. °How is this diagnosed? °This condition may be diagnosed based on: °· Your symptoms. °· A chest X-ray. °· A measurement of the amount of oxygen in your blood. °How is this treated? °This condition is treated with antibiotics. Your health care provider may take a sample of cells (culture) from your throat to determine what type of bacteria is in your lungs and change your antibiotic based on  the results. If you have bacteria in your blood, trouble breathing, or a low oxygen level, you may need to be treated at the hospital. At the hospital, you will be given antibiotics through an IV tube. You may also be given oxygen or breathing treatments. °Follow these instructions at home: °Medicine °· Take your antibiotic medicine as told by your health care provider. Do not stop taking the antibiotic even if you start to feel better. °· Take over-the-counter and other prescription medicines only as told by your health care provider. °Activity °· Rest at home until you feel better. °· Return to your normal activities as told by your health care provider. Ask your health care provider what activities are safe for you. °General instructions °· Drink enough fluid to keep your urine clear or pale yellow. °· Do not use any products that contain nicotine or tobacco, such as cigarettes and e-cigarettes. If you need help quitting, ask your health care provider. °· Limit alcohol intake to no more than 1 drink per day for nonpregnant women and 2 drinks per day for men. One drink equals 12 oz of beer, 5 oz of wine, or 1½ oz of hard liquor. °· Keep all follow-up visits as told by your health care provider. This is important. °How is this prevented? °Actions that I can take °To lower your risk of getting this condition again: °· Do not smoke. This includes e-cigarettes. °· Do not drink too much alcohol. °· Keep your immune system healthy by eating well and   getting enough sleep.  Get a flu shot every year (annually).  Get a pneumonia vaccination if:  You are older than age 68.  You smoke.  You have a long-lasting condition like lung disease.  Exercise your lungs by taking deep breaths, walking, and using an incentive spirometer as directed.  Wash your hands often with soap and water. If you cannot get to a sink to wash your hands, use an alcohol-based hand cleaner.  Make sure your health care providers are  washing their hands. If you do not see them wash their hands, ask them to do so.  When you are in a health care facility, avoid touching your eyes, nose, and mouth.  Avoid touching any surface near where people have coughed or sneezed.  Stand away from sick people when they are coughing or sneezing.  Wear a mask if you cannot avoid exposure to people who are sick.  Clean all surfaces often with a disinfectant cleaner, especially if someone is sick at home or work. Precautions of my health care team Hospitals, nursing homes, and other health care facilities take special care to try to prevent healthcare-associated pneumonia. To do this, your health care team may:  Clean their hands with soap and water or with alcohol-based hand sanitizer before and after seeing patients.  Wear gloves or masks during treatment.  Sanitize medical instruments, tubes, other equipment, and surfaces in patient rooms.  Raise (elevate) the head of your hospital bed so you are not lying flat. The head of the bed may be elevated 30 degrees or more.  Have you sit up and move around as soon as possible after surgery.  Only insert a breathing tube if needed.  Do these things for you if you have a breathing tube:  Clean the inside of your mouth regularly.  Remove the breathing tube as soon as it is no longer needed. Contact a health care provider if:  Your symptoms do not get better or they get worse.  Your symptoms come back after you have finished taking your antibiotics. Get help right away if:  You have trouble breathing.  You have confusion or difficulty thinking. This information is not intended to replace advice given to you by your health care provider. Make sure you discuss any questions you have with your health care provider. Document Released: 03/26/2016 Document Revised: 08/20/2016 Document Reviewed: 08/02/2016 Elsevier Interactive Patient Education  2017 [employer].   Discharge  instructions:  Please get your medications reviewed and adjusted by your Primary MD.  Please request your Primary MD to go over all Hospital Tests and Procedure/Radiological results at the follow up, please get all Hospital records sent to your Prim MD by signing hospital release before you go home.  If you had Pneumonia of Lung problems at the Hospital: Please get a 2 view Chest X ray done in 6-8 weeks after hospital discharge or sooner if instructed by your Primary MD.  If you have Congestive Heart Failure: Please call your Cardiologist or Primary MD anytime you have any of the following symptoms:  1) 3 pound weight gain in 24 hours or 5 pounds in 1 week  2) shortness of breath, with or without a dry hacking cough  3) swelling in the hands, feet or stomach  4) if you have to sleep on extra pillows at night in order to breathe  Follow cardiac low salt diet and 1.5 lit/day fluid restriction.  If you have diabetes Accuchecks 4 times/day, Once in  AM empty stomach and then before each meal. Log in all results and show them to your primary doctor at your next visit. If any glucose reading is under 80 or above 300 call your primary MD immediately.  If you have Seizure/Convulsions/Epilepsy: Please do not drive, operate heavy machinery, participate in activities at heights or participate in high speed sports until you have seen by Primary MD or a Neurologist and advised to do so again.  If you had Gastrointestinal Bleeding: Please ask your Primary MD to check a complete blood count within one week of discharge or at your next visit. Your endoscopic/colonoscopic biopsies that are pending at the time of discharge, will also need to followed by your Primary MD.  Get Medicines reviewed and adjusted. Please take all your medications with you for your next visit with your Primary MD  Please request your Primary MD to go over all hospital tests and procedure/radiological results at the follow up,  please ask your Primary MD to get all Hospital records sent to his/her office.  If you experience worsening of your admission symptoms, develop shortness of breath, life threatening emergency, suicidal or homicidal thoughts you must seek medical attention immediately by calling 911 or calling your MD immediately  if symptoms less severe.  You must read complete instructions/literature along with all the possible adverse reactions/side effects for all the Medicines you take and that have been prescribed to you. Take any new Medicines after you have completely understood and accpet all the possible adverse reactions/side effects.   Do not drive or operate heavy machinery when taking Pain medications.   Do not take more than prescribed Pain, Sleep and Anxiety Medications  Special Instructions: If you have smoked or chewed Tobacco  in the last 2 yrs please stop smoking, stop any regular Alcohol  and or any Recreational drug use.  Wear Seat belts while driving.  Please note You were cared for by a hospitalist during your hospital stay. If you have any questions about your discharge medications or the care you received while you were in the hospital after you are discharged, you can call the unit and asked to speak with the hospitalist on call if the hospitalist that took care of you is not available. Once you are discharged, your primary care physician will handle any further medical issues. Please note that NO REFILLS for any discharge medications will be authorized once you are discharged, as it is imperative that you return to your primary care physician (or establish a relationship with a primary care physician if you do not have one) for your aftercare needs so that they can reassess your need for medications and monitor your lab values.  You can reach the hospitalist office at phone [phone] or fax [phone]   If you do not have a primary care physician, you can call [phone] for a  physician referral.

## 2016-12-30 NOTE — Progress Notes (Signed)
Patient right wrist continues to swell. Ice applied and elevated. MD on call notified.

## 2016-12-31 DIAGNOSIS — J441 Chronic obstructive pulmonary disease with (acute) exacerbation: Secondary | ICD-10-CM | POA: Diagnosis not present

## 2016-12-31 DIAGNOSIS — I110 Hypertensive heart disease with heart failure: Secondary | ICD-10-CM | POA: Diagnosis not present

## 2016-12-31 DIAGNOSIS — R262 Difficulty in walking, not elsewhere classified: Secondary | ICD-10-CM | POA: Diagnosis not present

## 2016-12-31 DIAGNOSIS — Z9181 History of falling: Secondary | ICD-10-CM | POA: Diagnosis not present

## 2016-12-31 DIAGNOSIS — Z7982 Long term (current) use of aspirin: Secondary | ICD-10-CM | POA: Diagnosis not present

## 2016-12-31 DIAGNOSIS — Z8701 Personal history of pneumonia (recurrent): Secondary | ICD-10-CM | POA: Diagnosis not present

## 2016-12-31 DIAGNOSIS — M6281 Muscle weakness (generalized): Secondary | ICD-10-CM | POA: Diagnosis not present

## 2016-12-31 DIAGNOSIS — I509 Heart failure, unspecified: Secondary | ICD-10-CM | POA: Diagnosis not present

## 2016-12-31 LAB — CULTURE, BLOOD (ROUTINE X 2)
CULTURE: NO GROWTH
CULTURE: NO GROWTH

## 2017-01-02 DIAGNOSIS — E038 Other specified hypothyroidism: Secondary | ICD-10-CM | POA: Diagnosis not present

## 2017-01-02 DIAGNOSIS — J189 Pneumonia, unspecified organism: Secondary | ICD-10-CM | POA: Diagnosis not present

## 2017-01-02 DIAGNOSIS — Z6826 Body mass index (BMI) 26.0-26.9, adult: Secondary | ICD-10-CM | POA: Diagnosis not present

## 2017-01-02 DIAGNOSIS — I509 Heart failure, unspecified: Secondary | ICD-10-CM | POA: Diagnosis not present

## 2017-01-02 DIAGNOSIS — I10 Essential (primary) hypertension: Secondary | ICD-10-CM | POA: Diagnosis not present

## 2017-01-02 DIAGNOSIS — J09X2 Influenza due to identified novel influenza A virus with other respiratory manifestations: Secondary | ICD-10-CM | POA: Diagnosis not present

## 2017-01-02 DIAGNOSIS — J449 Chronic obstructive pulmonary disease, unspecified: Secondary | ICD-10-CM | POA: Diagnosis not present

## 2017-01-03 DIAGNOSIS — J441 Chronic obstructive pulmonary disease with (acute) exacerbation: Secondary | ICD-10-CM | POA: Diagnosis not present

## 2017-01-03 DIAGNOSIS — Z8701 Personal history of pneumonia (recurrent): Secondary | ICD-10-CM | POA: Diagnosis not present

## 2017-01-03 DIAGNOSIS — I509 Heart failure, unspecified: Secondary | ICD-10-CM | POA: Diagnosis not present

## 2017-01-03 DIAGNOSIS — R262 Difficulty in walking, not elsewhere classified: Secondary | ICD-10-CM | POA: Diagnosis not present

## 2017-01-03 DIAGNOSIS — M6281 Muscle weakness (generalized): Secondary | ICD-10-CM | POA: Diagnosis not present

## 2017-01-03 DIAGNOSIS — I110 Hypertensive heart disease with heart failure: Secondary | ICD-10-CM | POA: Diagnosis not present

## 2017-01-06 DIAGNOSIS — I110 Hypertensive heart disease with heart failure: Secondary | ICD-10-CM | POA: Diagnosis not present

## 2017-01-06 DIAGNOSIS — I509 Heart failure, unspecified: Secondary | ICD-10-CM | POA: Diagnosis not present

## 2017-01-06 DIAGNOSIS — R262 Difficulty in walking, not elsewhere classified: Secondary | ICD-10-CM | POA: Diagnosis not present

## 2017-01-06 DIAGNOSIS — Z8701 Personal history of pneumonia (recurrent): Secondary | ICD-10-CM | POA: Diagnosis not present

## 2017-01-06 DIAGNOSIS — J441 Chronic obstructive pulmonary disease with (acute) exacerbation: Secondary | ICD-10-CM | POA: Diagnosis not present

## 2017-01-06 DIAGNOSIS — M6281 Muscle weakness (generalized): Secondary | ICD-10-CM | POA: Diagnosis not present

## 2017-01-09 DIAGNOSIS — J441 Chronic obstructive pulmonary disease with (acute) exacerbation: Secondary | ICD-10-CM | POA: Diagnosis not present

## 2017-01-09 DIAGNOSIS — I110 Hypertensive heart disease with heart failure: Secondary | ICD-10-CM | POA: Diagnosis not present

## 2017-01-09 DIAGNOSIS — M6281 Muscle weakness (generalized): Secondary | ICD-10-CM | POA: Diagnosis not present

## 2017-01-09 DIAGNOSIS — Z8701 Personal history of pneumonia (recurrent): Secondary | ICD-10-CM | POA: Diagnosis not present

## 2017-01-09 DIAGNOSIS — I509 Heart failure, unspecified: Secondary | ICD-10-CM | POA: Diagnosis not present

## 2017-01-09 DIAGNOSIS — R262 Difficulty in walking, not elsewhere classified: Secondary | ICD-10-CM | POA: Diagnosis not present

## 2017-01-13 DIAGNOSIS — Z8701 Personal history of pneumonia (recurrent): Secondary | ICD-10-CM | POA: Diagnosis not present

## 2017-01-13 DIAGNOSIS — R262 Difficulty in walking, not elsewhere classified: Secondary | ICD-10-CM | POA: Diagnosis not present

## 2017-01-13 DIAGNOSIS — M6281 Muscle weakness (generalized): Secondary | ICD-10-CM | POA: Diagnosis not present

## 2017-01-13 DIAGNOSIS — I509 Heart failure, unspecified: Secondary | ICD-10-CM | POA: Diagnosis not present

## 2017-01-13 DIAGNOSIS — J441 Chronic obstructive pulmonary disease with (acute) exacerbation: Secondary | ICD-10-CM | POA: Diagnosis not present

## 2017-01-13 DIAGNOSIS — I110 Hypertensive heart disease with heart failure: Secondary | ICD-10-CM | POA: Diagnosis not present

## 2017-01-16 DIAGNOSIS — M25532 Pain in left wrist: Secondary | ICD-10-CM | POA: Diagnosis not present

## 2017-01-17 NOTE — Progress Notes (Signed)
[name] Date of Birth: [birth year]   History of Present Illness: [name] is seen today for followup of diastolic CHF and CAD. She has a history of coronary disease and had remote angioplasty of the mid right coronary in 1995.  Last carotid dopplers in Nov. 2016 showed 40-59% stenosis on the left. The right ICA is clear. She has a history of PAD. In October 2015 she developed increased SOB and edema. Echo showed normal systolic function with grade 2 diastolic dysfunction. Myoview study was normal. She was started on lasix daily with resolution of her symptoms and edema. She was seen  in February 2017 by Kelly Deforest PA with symptoms of SOB and palpitations. Echo showed normal EF [id number] with diastolic dysfunction and moderate pulmonary HTN. Holter monitor showed isolated PACs and PVCs with a 4 beat run of idioventricular rhythm. Metoprolol dose was increased. D- dimer was normal.   In December 2017 she suffered a radial fracture and had ORIF. She was admitted in February with influenza A complicated by PNA. She has been receiving outpatient PT at home. Wearing oxygen 3 liters [place]. Followed by pulmonary. She is followed regularly by [name] for PAD and carotid disease. Last seen in November.   On follow up today she is doing well from a cardiac standpoint. She denies  CP, or edema. Palpitations are better. Noted HR was faster when she had the flu.  Chronic dyspnea is better since her hospitalization. 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 She does have claudication when she walks longer distance R>L. No TIA or CVA symptoms. No edema.   Current Outpatient Prescriptions on File Prior to Visit  Medication Sig Dispense Refill  . acetaminophen (TYLENOL) 500 MG tablet Take 1,000 mg by mouth every 6 (six) hours as needed.    Marland Kitchen albuterol (PROVENTIL HFA;VENTOLIN HFA) 108 (90 Base) MCG/ACT inhaler Inhale 2 puffs into the lungs every 6 (six) hours as needed for wheezing or shortness of  breath. 1 Inhaler 0  . Aloe-Sodium Chloride (AYR SALINE NASAL GEL NA) Place 1 application into the nose daily as needed (dryness).    Marland Kitchen aspirin 81 MG tablet Take 81 mg by mouth daily.      . Carboxymethylcellul-Glycerin (LUBRICATING EYE DROPS OP) Apply 1 drop to eye daily as needed (dry eyes).    . Cholecalciferol (VITAMIN D3) 2000 units capsule Take 2,000 Units by mouth daily.    . clobetasol cream (TEMOVATE) [id number] % Apply 1 application topically once a week.    . Coenzyme Q10 (CO Q 10) 100 MG CAPS Take 100 mg by mouth daily.    . Cyanocobalamin (B-12) 500 MCG TABS Take 500 mcg by mouth daily.    Marland Kitchen denosumab (PROLIA) 60 MG/ML SOLN injection Inject 60 mg into the skin every 6 (six) months. Administer in upper arm, thigh, or abdomen    . folic acid (FOLVITE) [id number] MCG tablet Take 400 mcg by mouth every evening.     . furosemide (LASIX) 20 MG tablet TAKE 1 TABLET(20 MG) BY MOUTH DAILY (Patient taking differently: TAKE 1 TABLET(20 MG) BY MOUTH DAILY IN THE AFTERNOON) 90 tablet 1  . isosorbide mononitrate (IMDUR) 30 MG 24 hr tablet Take 1 tablet (30 mg total) by mouth daily. 90 tablet 3  . levothyroxine (SYNTHROID, LEVOTHROID) 137 MCG tablet Take 137 mcg by mouth daily.      . metoprolol (LOPRESSOR) 50 MG tablet Take 50 mg by mouth 2 (two) times daily.    Marland Kitchen  Multiple Vitamin (MULTIVITAMIN WITH MINERALS) TABS tablet Take 1 tablet by mouth daily.    . multivitamin-lutein (OCUVITE-LUTEIN) CAPS capsule Take 1 capsule by mouth daily.    . nitroGLYCERIN (NITROSTAT) 0.4 MG SL tablet Place 0.4 mg under the tongue every 5 (five) minutes as needed for chest pain (x 3 pills).    . NONFORMULARY OR COMPOUNDED ITEM Estradiol 0000000 mg [id number] applicators s: insert vaginally twice weekly. 24 each 3  . Omega-3 Fatty Acids (FISH OIL) 1200 MG CAPS Take 1,200 mg by mouth 2 (two) times daily.    Marland Kitchen umeclidinium-vilanterol (ANORO ELLIPTA) 62.5-25 MCG/INH AEPB Inhale 1 puff into the lungs daily. 1 each 4   No current  facility-administered medications on file prior to visit.     No Known Allergies  Past Medical History:  Diagnosis Date  . Arthritis   . B12 deficiency   . Basal cell cancer    Nose  . Carotid artery occlusion   . Cataract   . Chronic diastolic CHF (congestive heart failure) ([place])   . Colon polyps   . COPD (chronic obstructive pulmonary disease) ([place])    STAGE 2  . Coronary artery disease    status post angioplasty of the mid RCA in 1995  . Dyspnea   . First degree atrioventricular block   . History of obesity   . Hyperlipidemia   . Hypertension    well controlled  . Hyperthyroidism   . Lichen sclerosus et atrophicus of the vulva   . Osteopenia   . PAD (peripheral artery disease) ([facility])    with stable claudication  . Pulmonary fibrosis ([place])    wears 4L home O2 at baseline    Past Surgical History:  Procedure Laterality Date  . CAROTID ENDARTERECTOMY Right 2002  . CATARACT EXTRACTION W/ INTRAOCULAR LENS  IMPLANT, BILATERAL    . CHOLECYSTECTOMY  1999  . ENDARTERECTOMY     right carotid endarterectomy  . KNEE ARTHROSCOPY Left   . MENISCUS REPAIR Left 10/29/2012  . OPEN REDUCTION INTERNAL FIXATION (ORIF) DISTAL RADIAL FRACTURE Left 11/08/2016   Procedure: OPEN TREATMENT OF LEFT DISTAL RADIUS FRACTURE;  Surgeon: Milly Jakob, MD;  Location: [facility];  Service: Orthopedics;  Laterality: Left;  . REPAIR TENDONS FOOT      History  Smoking Status  . Former Smoker  . Packs/day: 1.00  . Years: 40.00  . Types: Cigarettes  . Quit date: 11/18/1993  Smokeless Tobacco  . Never Used    History  Alcohol Use No    Family History  Problem Relation Age of Onset  . Heart attack Father 34  . Hypertension Father   . Heart disease Brother     underwent coronary bypass grafting at age 74  . Hypertension Brother   . Diabetes Sister     Review of Systems: The review of systems is  as noted above. All other systems were reviewed and are negative.  Physical Exam: BP (!)  155/72   Pulse 81   Ht 5\' 4"  (1.626 m)   Wt 160 lb 3.2 oz (72.7 kg)   BMI 27.50 kg/m  She is an overweight white female in no acute distress. HEENT is normal.  Neck is without JVD, adenopathy, thyromegaly. There is a right carotid bruit. She has an old CEA scar. Lungs are clear. Cardiac exam reveals a regular rate and rhythm without a gallop. There is a soft systolic ejection murmur the right upper sternal border. Abdomen is soft and nontender. She has no  lower extremity edema. Pedal pulses are diminished. Feet are pink and warm. Skin is warm and dry. She is alert oriented x3. Cranial nerves II through XII are intact.  LABORATORY DATA:  Lab Results  Component Value Date   WBC 14.1 (H) 12/28/2016   HGB 11.5 (L) 12/28/2016   HCT 36.0 12/28/2016   PLT 189 12/28/2016   GLUCOSE 114 (H) 12/29/2016   ALT 16 12/26/2016   AST 22 12/26/2016   NA 138 12/29/2016   K 4.4 12/29/2016   CL 104 12/29/2016   CREATININE 0.84 12/29/2016   BUN 28 (H) 12/29/2016   CO2 25 12/29/2016   TSH 1.12 01/03/2016   HGBA1C 5.0 12/28/2016    Labs dated 06/12/16: cholesterol 95, triglycerides 109, HDL 37, LDL 36.  Echo: 12/29/15:Study Conclusions  - Left ventricle: The cavity size was normal. Wall thickness was  normal. Systolic function was normal. The estimated ejection  fraction was in the range of 60% to 65%. Features are consistent  with a pseudonormal left ventricular filling pattern, with  concomitant abnormal relaxation and increased filling pressure  (grade 2 diastolic dysfunction). Doppler parameters are  consistent with high ventricular filling pressure. - Mitral valve: Calcified annulus. Mildly thickened leaflets .  Valve area by continuity equation (using LVOT flow): 1.83 cm^2. - Pulmonary arteries: PA peak pressure: 64 mm Hg (S).    Assessment / Plan: 1. Chronic diastolic CHF. She appears eubolemic on diuretic therapy.    Will continue current therapy. Sodium restriction.  2.  Coronary disease with remote angioplasty of the right coronary in 1995. Myoview study in Dec. 2015 showed no significant ischemia and ejection fraction of 76%. Continue medical management with aspirin, metoprolol, and ACE inhibitor. She is asymptomatic.   3.  PAD with chronic stable  claudication. ABIs in November 2017 improved to .7 on right and .77 on left.   4. Carotid arterial disease status post right carotid endarterectomy. Dopplers in November 2017 stable.   5. Hypertension, controlled.  6. Hyperlipidemia well controlled on Crestor, niacin, and fish oil.   7. COPD. Recent exacerbation with the flu and PNA. Improving. She has a prior history of heavy tobacco abuse. Followed by Dr. Elsworth Soho.  I will follow up in 6 months.

## 2017-01-20 ENCOUNTER — Ambulatory Visit (INDEPENDENT_AMBULATORY_CARE_PROVIDER_SITE_OTHER): Payer: Medicare Other | Admitting: Cardiology

## 2017-01-20 ENCOUNTER — Encounter: Payer: Self-pay | Admitting: Cardiology

## 2017-01-20 VITALS — BP 155/72 | HR 81 | Ht 64.0 in | Wt 160.2 lb

## 2017-01-20 DIAGNOSIS — I739 Peripheral vascular disease, unspecified: Secondary | ICD-10-CM | POA: Diagnosis not present

## 2017-01-20 DIAGNOSIS — I110 Hypertensive heart disease with heart failure: Secondary | ICD-10-CM | POA: Diagnosis not present

## 2017-01-20 DIAGNOSIS — I2510 Atherosclerotic heart disease of native coronary artery without angina pectoris: Secondary | ICD-10-CM | POA: Diagnosis not present

## 2017-01-20 DIAGNOSIS — I5032 Chronic diastolic (congestive) heart failure: Secondary | ICD-10-CM

## 2017-01-20 DIAGNOSIS — I6523 Occlusion and stenosis of bilateral carotid arteries: Secondary | ICD-10-CM

## 2017-01-20 DIAGNOSIS — I10 Essential (primary) hypertension: Secondary | ICD-10-CM

## 2017-01-20 NOTE — Patient Instructions (Signed)
Continue your current therapy  I will see you in 6 months.   

## 2017-01-21 DIAGNOSIS — I509 Heart failure, unspecified: Secondary | ICD-10-CM | POA: Diagnosis not present

## 2017-01-21 DIAGNOSIS — I110 Hypertensive heart disease with heart failure: Secondary | ICD-10-CM | POA: Diagnosis not present

## 2017-01-21 DIAGNOSIS — M6281 Muscle weakness (generalized): Secondary | ICD-10-CM | POA: Diagnosis not present

## 2017-01-21 DIAGNOSIS — R262 Difficulty in walking, not elsewhere classified: Secondary | ICD-10-CM | POA: Diagnosis not present

## 2017-01-21 DIAGNOSIS — J441 Chronic obstructive pulmonary disease with (acute) exacerbation: Secondary | ICD-10-CM | POA: Diagnosis not present

## 2017-01-21 DIAGNOSIS — Z8701 Personal history of pneumonia (recurrent): Secondary | ICD-10-CM | POA: Diagnosis not present

## 2017-01-23 DIAGNOSIS — J441 Chronic obstructive pulmonary disease with (acute) exacerbation: Secondary | ICD-10-CM | POA: Diagnosis not present

## 2017-01-23 DIAGNOSIS — Z8701 Personal history of pneumonia (recurrent): Secondary | ICD-10-CM | POA: Diagnosis not present

## 2017-01-23 DIAGNOSIS — I509 Heart failure, unspecified: Secondary | ICD-10-CM | POA: Diagnosis not present

## 2017-01-23 DIAGNOSIS — I110 Hypertensive heart disease with heart failure: Secondary | ICD-10-CM | POA: Diagnosis not present

## 2017-01-23 DIAGNOSIS — R262 Difficulty in walking, not elsewhere classified: Secondary | ICD-10-CM | POA: Diagnosis not present

## 2017-01-23 DIAGNOSIS — M6281 Muscle weakness (generalized): Secondary | ICD-10-CM | POA: Diagnosis not present

## 2017-01-24 DIAGNOSIS — J189 Pneumonia, unspecified organism: Secondary | ICD-10-CM | POA: Diagnosis not present

## 2017-01-24 DIAGNOSIS — E038 Other specified hypothyroidism: Secondary | ICD-10-CM | POA: Diagnosis not present

## 2017-01-24 DIAGNOSIS — M25532 Pain in left wrist: Secondary | ICD-10-CM | POA: Diagnosis not present

## 2017-01-24 DIAGNOSIS — I2510 Atherosclerotic heart disease of native coronary artery without angina pectoris: Secondary | ICD-10-CM | POA: Diagnosis not present

## 2017-01-24 DIAGNOSIS — J449 Chronic obstructive pulmonary disease, unspecified: Secondary | ICD-10-CM | POA: Diagnosis not present

## 2017-01-24 DIAGNOSIS — Z1389 Encounter for screening for other disorder: Secondary | ICD-10-CM | POA: Diagnosis not present

## 2017-01-24 DIAGNOSIS — I509 Heart failure, unspecified: Secondary | ICD-10-CM | POA: Diagnosis not present

## 2017-01-24 DIAGNOSIS — S52532D Colles' fracture of left radius, subsequent encounter for closed fracture with routine healing: Secondary | ICD-10-CM | POA: Diagnosis not present

## 2017-01-24 DIAGNOSIS — I10 Essential (primary) hypertension: Secondary | ICD-10-CM | POA: Diagnosis not present

## 2017-01-24 DIAGNOSIS — Z6827 Body mass index (BMI) 27.0-27.9, adult: Secondary | ICD-10-CM | POA: Diagnosis not present

## 2017-01-29 ENCOUNTER — Other Ambulatory Visit: Payer: Self-pay | Admitting: *Deleted

## 2017-01-29 MED ORDER — METOPROLOL TARTRATE 50 MG PO TABS: 50.0000 mg | ORAL_TABLET | Freq: Two times a day (BID) | ORAL | 3 refills | Status: DC

## 2017-01-30 ENCOUNTER — Other Ambulatory Visit: Payer: Self-pay | Admitting: Cardiology

## 2017-02-17 ENCOUNTER — Other Ambulatory Visit: Payer: Self-pay | Admitting: Dermatology

## 2017-02-17 DIAGNOSIS — L570 Actinic keratosis: Secondary | ICD-10-CM | POA: Diagnosis not present

## 2017-02-17 DIAGNOSIS — L821 Other seborrheic keratosis: Secondary | ICD-10-CM | POA: Diagnosis not present

## 2017-02-17 DIAGNOSIS — C4441 Basal cell carcinoma of skin of scalp and neck: Secondary | ICD-10-CM | POA: Diagnosis not present

## 2017-02-19 ENCOUNTER — Ambulatory Visit (INDEPENDENT_AMBULATORY_CARE_PROVIDER_SITE_OTHER)
Admission: RE | Admit: 2017-02-19 | Discharge: 2017-02-19 | Disposition: A | Discharge status: Home / Self Care | Payer: Medicare Other | Source: Ambulatory Visit | Attending: Adult Health | Admitting: Adult Health

## 2017-02-19 ENCOUNTER — Ambulatory Visit (INDEPENDENT_AMBULATORY_CARE_PROVIDER_SITE_OTHER): Payer: Medicare Other | Admitting: Adult Health

## 2017-02-19 ENCOUNTER — Encounter: Payer: Self-pay | Admitting: Adult Health

## 2017-02-19 DIAGNOSIS — I5032 Chronic diastolic (congestive) heart failure: Secondary | ICD-10-CM

## 2017-02-19 DIAGNOSIS — J449 Chronic obstructive pulmonary disease, unspecified: Secondary | ICD-10-CM | POA: Diagnosis not present

## 2017-02-19 DIAGNOSIS — J189 Pneumonia, unspecified organism: Secondary | ICD-10-CM

## 2017-02-19 DIAGNOSIS — I272 Pulmonary hypertension, unspecified: Secondary | ICD-10-CM

## 2017-02-19 DIAGNOSIS — J9611 Chronic respiratory failure with hypoxia: Secondary | ICD-10-CM

## 2017-02-19 DIAGNOSIS — I6523 Occlusion and stenosis of bilateral carotid arteries: Secondary | ICD-10-CM

## 2017-02-19 MED ORDER — UMECLIDINIUM-VILANTEROL 62.5-25 MCG/INH IN AEPB: 1.0000 | INHALATION_SPRAY | Freq: Every day | RESPIRATORY_TRACT | 0 refills | Status: DC

## 2017-02-19 NOTE — Assessment & Plan Note (Signed)
Appears compensated  Cont on Lasix

## 2017-02-19 NOTE — Addendum Note (Signed)
Addended by: Parke Poisson E on: 02/19/2017 12:58 PM   Modules accepted: Orders

## 2017-02-19 NOTE — Assessment & Plan Note (Signed)
Recent flare now resolving   Plan  Cont on ANORO .

## 2017-02-19 NOTE — Assessment & Plan Note (Signed)
Clinically improved after abx Check cxr today .  

## 2017-02-19 NOTE — Assessment & Plan Note (Signed)
Moderate Pulmonary HTN with underlying COPD  Cont on O2 , keep o2 sats >90%.  Cont on lasix

## 2017-02-19 NOTE — Addendum Note (Signed)
Addended by: Tyson Dense on: 02/19/2017 10:12 AM   Modules accepted: Orders

## 2017-02-19 NOTE — Progress Notes (Signed)
@Patient  ID: [name], female    DOB: 01/24/1929, 83 y.o.   MRN: [id number]  Chief Complaint  Patient presents with  . Follow-up    COPD    Referring provider: [name], MD  HPI: 83 yo former smoker followed for COPD, O2 RF  . Has pulmonary HTN  Hx of polycythemia followed by hematology -undergoes phlebotomy   Significant tests/ events  Chest x-ray shows increased interstitial prominence compared to her older films from 2009 HRCT 01/2016 >> no evidence of interstitial lung disease, mild bibasal scarring  PFTs 01/2016 -FEV1 1.15 - 64%, no BD response, DLCO 22%  Echo - PAP 64, gr 2 DD, nml LV fn  02/19/2017 Follow up : COPD / O2 RF /Pulm HTN/ [facility] follow up -Flu /PNA  Patient presents for a post hospital follow-up. Patient was admitted in February for influenza complicated by healthcare associated pneumonia. She was treated with Tamiflu. IV antibiotics. Chest x-ray showed bilateral lower lobe consolidation concerning for pneumonia. She was discharged on oxygen at 5 L.  Since discharge. Patient is feeling better with decreased cough, congestion and shortness of breath. She feels that she is nearing her baseline. She denies any hemoptysis, orthopnea, PND, nausea, vomiting or diarrhea. She remains on ANORO .   Previous Documentation showed she needed 3-4 L continuous flow to keep O2 sat >90% at rest and 6l/m walking . However today she came in on 2l/m  Walk test in office today shows she needs 3l/m at rest and 8l/m walking.   No Known Allergies  Immunization History  Administered Date(s) Administered  . Influenza, High Dose Seasonal PF 08/18/2016  . Influenza-Unspecified 08/16/2015    Past Medical History:  Diagnosis Date  . Arthritis   . B12 deficiency   . Basal cell cancer    Nose  . Carotid artery occlusion   . Cataract   . Chronic diastolic CHF (congestive heart failure) ([place])   . Colon polyps   . COPD (chronic obstructive pulmonary disease) ([place])     STAGE 2  . Coronary artery disease    status post angioplasty of the mid RCA in 1995  . Dyspnea   . First degree atrioventricular block   . History of obesity   . Hyperlipidemia   . Hypertension    well controlled  . Hyperthyroidism   . Lichen sclerosus et atrophicus of the vulva   . Osteopenia   . PAD (peripheral artery disease) (H[facility])    with stable claudication  . Pulmonary fibrosis ([place])    wears 4L home O2 at baseline    Tobacco History: History  Smoking Status  . Former Smoker  . Packs/day: 1.00  . Years: 40.00  . Types: Cigarettes  . Quit date: 11/18/1993  Smokeless Tobacco  . Never Used   Counseling given: Not Answered   Outpatient Encounter Prescriptions as of 02/19/2017  Medication Sig  . acetaminophen (TYLENOL) 500 MG tablet Take 1,000 mg by mouth every 6 (six) hours as needed.  Marland Kitchen albuterol (PROVENTIL HFA;VENTOLIN HFA) 108 (90 Base) MCG/ACT inhaler Inhale 2 puffs into the lungs every 6 (six) hours as needed for wheezing or shortness of breath.  . Aloe-Sodium Chloride (AYR SALINE NASAL GEL NA) Place 1 application into the nose daily as needed (dryness).  Marland Kitchen aspirin 81 MG tablet Take 81 mg by mouth daily.    . Carboxymethylcellul-Glycerin (LUBRICATING EYE DROPS OP) Apply 1 drop to eye daily as needed (dry eyes).  . Cholecalciferol (VITAMIN D3)  2000 units capsule Take 2,000 Units by mouth daily.  . clobetasol cream (TEMOVATE) 7.03 % Apply 1 application topically once a week.  . Coenzyme Q10 (CO Q 10) 100 MG CAPS Take 100 mg by mouth daily.  . Cyanocobalamin (B-12) 500 MCG TABS Take 500 mcg by mouth daily.  Marland Kitchen denosumab (PROLIA) 60 MG/ML SOLN injection Inject 60 mg into the skin every 6 (six) months. Administer in upper arm, thigh, or abdomen  . folic acid (FOLVITE) 500 MCG tablet Take 400 mcg by mouth every evening.   . furosemide (LASIX) 20 MG tablet Take 1 tablet (20 mg total) by mouth daily.  . isosorbide mononitrate (IMDUR) 30 MG 24 hr tablet Take 1 tablet (30 mg  total) by mouth daily.  Marland Kitchen levothyroxine (SYNTHROID, LEVOTHROID) 137 MCG tablet Take 137 mcg by mouth daily.    . metoprolol (LOPRESSOR) 50 MG tablet Take 1 tablet (50 mg total) by mouth 2 (two) times daily.  . Multiple Vitamin (MULTIVITAMIN WITH MINERALS) TABS tablet Take 1 tablet by mouth daily.  . multivitamin-lutein (OCUVITE-LUTEIN) CAPS capsule Take 1 capsule by mouth daily.  . nitroGLYCERIN (NITROSTAT) 0.4 MG SL tablet Place 0.4 mg under the tongue every 5 (five) minutes as needed for chest pain (x 3 pills).  . NONFORMULARY OR COMPOUNDED ITEM Estradiol 9.38 mg #18 applicators s: insert vaginally twice weekly.  . Omega-3 Fatty Acids (FISH OIL) 1200 MG CAPS Take 1,200 mg by mouth 2 (two) times daily.  . rosuvastatin (CRESTOR) 40 MG tablet Take 40 mg by mouth daily.  Marland Kitchen umeclidinium-vilanterol (ANORO ELLIPTA) 62.5-25 MCG/INH AEPB Inhale 1 puff into the lungs daily.   No facility-administered encounter medications on file as of 02/19/2017.      Review of Systems  Constitutional:   No  weight loss, night sweats,  Fevers, chills, fatigue, or  lassitude.  HEENT:   No headaches,  Difficulty swallowing,  Tooth/dental problems, or  Sore throat,                No sneezing, itching, ear ache, nasal congestion, post nasal drip,   CV:  No chest pain,  Orthopnea, PND, swelling in lower extremities, anasarca, dizziness, palpitations, syncope.   GI  No heartburn, indigestion, abdominal pain, nausea, vomiting, diarrhea, change in bowel habits, loss of appetite, bloody stools.   Resp: No shortness of breath with exertion or at rest.  No excess mucus, no productive cough,  No non-productive cough,  No coughing up of blood.  No change in color of mucus.  No wheezing.  No chest wall deformity  Skin: no rash or lesions.  GU: no dysuria, change in color of urine, no urgency or frequency.  No flank pain, no hematuria   MS:  No joint pain or swelling.  No decreased range of motion.  No back  pain.    Physical Exam  BP 124/76 (BP Location: Left Arm, Cuff Size: Normal)   Pulse 74   Wt 160 lb 3.2 oz (72.7 kg)   SpO2 (!) 89%   BMI 27.50 kg/m   GEN: A/Ox3; pleasant , NAD, elderly , on O2    HEENT:  [place]/AT,  EACs-clear, TMs-wnl, NOSE-clear, THROAT-clear, no lesions, no postnasal drip or exudate noted.   NECK:  Supple w/ fair ROM; no JVD; normal carotid impulses w/o bruits; no thyromegaly or nodules palpated; no lymphadenopathy.    RESP  Decreased BS in bases , no  accessory muscle use, no dullness to percussion  CARD:  RRR, no m/r/g,  1+ peripheral edema, pulses intact, no cyanosis or clubbing.  GI:   Soft & nt; nml bowel sounds; no organomegaly or masses detected.   Musco: Warm bil, no deformities or joint swelling noted.   Neuro: alert, no focal deficits noted.    Skin: Warm, no lesions or rashes    Lab Results:  CBC    Component Value Date/Time   WBC 14.1 (H) 12/28/2016 0553   RBC 4.08 12/28/2016 0553   HGB 11.5 (L) 12/28/2016 0553   HGB 13.8 11/21/2016 0915   HCT 36.0 12/28/2016 0553   HCT 41.6 11/21/2016 0915   PLT 189 12/28/2016 0553   PLT 217 11/21/2016 0915   MCV 88.2 12/28/2016 0553   MCV 86.6 11/21/2016 0915   MCH 28.2 12/28/2016 0553   MCHC 31.9 12/28/2016 0553   RDW 14.0 12/28/2016 0553   RDW 14.5 11/21/2016 0915   LYMPHSABS 0.5 (L) 12/27/2016 0222   LYMPHSABS 1.0 11/21/2016 0915   MONOABS 0.6 12/27/2016 0222   MONOABS 0.7 11/21/2016 0915   EOSABS 0.0 12/27/2016 0222   EOSABS 0.0 11/21/2016 0915   BASOSABS 0.0 12/27/2016 0222   BASOSABS 0.1 11/21/2016 0915    BMET    Component Value Date/Time   NA 138 12/29/2016 0757   NA 139 07/05/2016 1152   K 4.4 12/29/2016 0757   K 4.5 07/05/2016 1152   CL 104 12/29/2016 0757   CO2 25 12/29/2016 0757   CO2 24 07/05/2016 1152   GLUCOSE 114 (H) 12/29/2016 0757   GLUCOSE 101 07/05/2016 1152   BUN 28 (H) 12/29/2016 0757   BUN 22.4 07/05/2016 1152   CREATININE 0.84 12/29/2016 0757    CREATININE 1.0 07/05/2016 1152   CALCIUM 9.1 12/29/2016 0757   CALCIUM 10.0 07/05/2016 1152   GFRNONAA >60 12/29/2016 0757   GFRAA >60 12/29/2016 0757    BNP    Component Value Date/Time   BNP 85.3 11/01/2014 1636    ProBNP    Component Value Date/Time   PROBNP 397.0 (H) 06/03/2008 0525    Imaging: No results found.   Assessment & Plan:   COPD GOLD II  Recent flare now resolving   Plan  Cont on ANORO .   Chronic diastolic CHF (congestive heart failure) Appears compensated  Cont on Lasix   Chronic respiratory failure with hypoxia (HCC) O2 needs are as follows:  Use oxygen 3l/m rest and 8l/m walking .   Follow up Dr. Elsworth Soho  2 months and As needed   Please contact office for sooner follow up if symptoms do not improve or worsen or seek emergency care       Pulmonary hypertension Moderate Pulmonary HTN with underlying COPD  Cont on O2 , keep o2 sats >90%.  Cont on lasix   HCAP (healthcare-associated pneumonia) Clinically improved after abx.  Check cxr today      Rexene Edison, NP 02/19/2017

## 2017-02-19 NOTE — Patient Instructions (Addendum)
Chest xray today .  Continue on ANORO daily , rinse after use.  Use oxygen 3l/m rest and 8l/m walking .  Continue on Lasix 20mg  daily .  Follow up Dr. Elsworth Soho  2 months and As needed   Please contact office for sooner follow up if symptoms do not improve or worsen or seek emergency care

## 2017-02-19 NOTE — Assessment & Plan Note (Signed)
O2 needs are as follows:  Use oxygen 3l/m rest and 8l/m walking .   Follow up Dr. Elsworth Soho  2 months and As needed   Please contact office for sooner follow up if symptoms do not improve or worsen or seek emergency care

## 2017-02-21 NOTE — Addendum Note (Signed)
Addended by: Parke Poisson E on: 02/21/2017 05:19 PM   Modules accepted: Orders

## 2017-03-27 ENCOUNTER — Other Ambulatory Visit: Payer: Self-pay | Admitting: Internal Medicine

## 2017-03-27 DIAGNOSIS — Z1231 Encounter for screening mammogram for malignant neoplasm of breast: Secondary | ICD-10-CM

## 2017-04-02 ENCOUNTER — Encounter: Payer: Self-pay | Admitting: Gynecology

## 2017-04-03 ENCOUNTER — Other Ambulatory Visit (HOSPITAL_COMMUNITY): Payer: Self-pay | Admitting: *Deleted

## 2017-04-04 ENCOUNTER — Encounter (HOSPITAL_COMMUNITY)
Admission: RE | Admit: 2017-04-04 | Discharge: 2017-04-04 | Disposition: A | Discharge status: Home / Self Care | Payer: Medicare Other | Source: Ambulatory Visit | Attending: Internal Medicine | Admitting: Internal Medicine

## 2017-04-04 DIAGNOSIS — M810 Age-related osteoporosis without current pathological fracture: Secondary | ICD-10-CM | POA: Insufficient documentation

## 2017-04-04 MED ORDER — DENOSUMAB 60 MG/ML ~~LOC~~ SOLN
60.0000 mg | Freq: Once | SUBCUTANEOUS | Status: AC
  Administered 2017-04-04: 13:00:00 60 mg via SUBCUTANEOUS
  Filled 2017-04-04: qty 1

## 2017-04-07 DIAGNOSIS — L728 Other follicular cysts of the skin and subcutaneous tissue: Secondary | ICD-10-CM | POA: Diagnosis not present

## 2017-04-07 DIAGNOSIS — D692 Other nonthrombocytopenic purpura: Secondary | ICD-10-CM | POA: Diagnosis not present

## 2017-04-07 DIAGNOSIS — Z85828 Personal history of other malignant neoplasm of skin: Secondary | ICD-10-CM | POA: Diagnosis not present

## 2017-04-28 DIAGNOSIS — H43813 Vitreous degeneration, bilateral: Secondary | ICD-10-CM | POA: Diagnosis not present

## 2017-04-28 DIAGNOSIS — H52203 Unspecified astigmatism, bilateral: Secondary | ICD-10-CM | POA: Diagnosis not present

## 2017-04-28 DIAGNOSIS — H04123 Dry eye syndrome of bilateral lacrimal glands: Secondary | ICD-10-CM | POA: Diagnosis not present

## 2017-04-28 DIAGNOSIS — H353131 Nonexudative age-related macular degeneration, bilateral, early dry stage: Secondary | ICD-10-CM | POA: Diagnosis not present

## 2017-04-29 ENCOUNTER — Ambulatory Visit
Admission: RE | Admit: 2017-04-29 | Discharge: 2017-04-29 | Disposition: A | Discharge status: Home / Self Care | Payer: Medicare Other | Source: Ambulatory Visit | Attending: Internal Medicine | Admitting: Internal Medicine

## 2017-04-29 DIAGNOSIS — Z1231 Encounter for screening mammogram for malignant neoplasm of breast: Secondary | ICD-10-CM

## 2017-05-05 ENCOUNTER — Ambulatory Visit (INDEPENDENT_AMBULATORY_CARE_PROVIDER_SITE_OTHER): Payer: Medicare Other | Admitting: Adult Health

## 2017-05-05 ENCOUNTER — Encounter: Payer: Self-pay | Admitting: Adult Health

## 2017-05-05 ENCOUNTER — Ambulatory Visit: Payer: Medicare Other | Admitting: Pulmonary Disease

## 2017-05-05 VITALS — BP 118/78 | HR 80 | Ht 63.0 in | Wt 159.0 lb

## 2017-05-05 DIAGNOSIS — I6523 Occlusion and stenosis of bilateral carotid arteries: Secondary | ICD-10-CM

## 2017-05-05 DIAGNOSIS — J449 Chronic obstructive pulmonary disease, unspecified: Secondary | ICD-10-CM

## 2017-05-05 DIAGNOSIS — J9611 Chronic respiratory failure with hypoxia: Secondary | ICD-10-CM | POA: Diagnosis not present

## 2017-05-05 DIAGNOSIS — I272 Pulmonary hypertension, unspecified: Secondary | ICD-10-CM

## 2017-05-05 DIAGNOSIS — I5032 Chronic diastolic (congestive) heart failure: Secondary | ICD-10-CM

## 2017-05-05 NOTE — Progress Notes (Signed)
@Patient  ID: [name], female    DOB: 10/19/1929, 83 y.o.   MRN: [id number]  Chief Complaint  Patient presents with  . Follow-up    COPD     Referring provider: [name], MD  HPI: 83 yo former smoker followed for COPD, O2 RF  . Has pulmonary HTN  Hx of polycythemia followed by hematology -undergoes phlebotomy   Significant tests/ events  Chest x-ray shows increased interstitial prominence compared to her older films from 2009 HRCT 01/2016 >> no evidence of interstitial lung disease, mild bibasal scarring  PFTs 01/2016 -FEV1 1.15 - 64%, no BD response, DLCO 22%  Echo - PAP 64, gr 2 DD, nml LV fn  05/05/2017 Follow up : COPD /O2 RF /Pulmonary HTN  Patient returns for a two-month follow-up. He says overall that she has been doing well since her last visit. She feels that her breathing is been better. She is not as short of breath. She's been trying to exercise at home. She is not noticed much of lower extremity swelling. Is taking Lasix daily. She's been waking every 2-3 days. Says that her weights have been stable. She remains on ANORO daily.  She denies chest pain, orthopnea, PND, or increased leg swelling  She remains on oxygen 3 L at rest and he has to use 8 L with exercise. O2 saturation today on room air was 85%. Patient required 8 L with walking to keep oxygen above 90% and 3 L at rest to keep oxygen above 90%  Pneumovax and Prevnar vaccine are up-to-date   No Known Allergies  Immunization History  Administered Date(s) Administered  . Influenza, High Dose Seasonal PF 08/18/2016  . Influenza-Unspecified 08/16/2015    Past Medical History:  Diagnosis Date  . Arthritis   . B12 deficiency   . Basal cell cancer    Nose  . Carotid artery occlusion   . Cataract   . Chronic diastolic CHF (congestive heart failure) ([place])   . Colon polyps   . COPD (chronic obstructive pulmonary disease) ([place])    STAGE 2  . Coronary artery disease    status post  angioplasty of the mid RCA in 1995  . Dyspnea   . First degree atrioventricular block   . History of obesity   . Hyperlipidemia   . Hypertension    well controlled  . Hyperthyroidism   . Lichen sclerosus et atrophicus of the vulva   . Osteopenia   . PAD (peripheral artery disease) ([facility])    with stable claudication  . Pulmonary fibrosis ([place])    wears 4L home O2 at baseline    Tobacco History: History  Smoking Status  . Former Smoker  . Packs/day: 1.00  . Years: 40.00  . Types: Cigarettes  . Quit date: 11/18/1993  Smokeless Tobacco  . Never Used   Counseling given: Not Answered   Outpatient Encounter Prescriptions as of 05/05/2017  Medication Sig  . acetaminophen (TYLENOL) 500 MG tablet Take 1,000 mg by mouth every 6 (six) hours as needed.  Marland Kitchen albuterol (PROVENTIL HFA;VENTOLIN HFA) 108 (90 Base) MCG/ACT inhaler Inhale 2 puffs into the lungs every 6 (six) hours as needed for wheezing or shortness of breath.  . Aloe-Sodium Chloride (AYR SALINE NASAL GEL NA) Place 1 application into the nose daily as needed (dryness).  Marland Kitchen aspirin 81 MG tablet Take 81 mg by mouth daily.    . Carboxymethylcellul-Glycerin (LUBRICATING EYE DROPS OP) Apply 1 drop to eye daily as needed (dry eyes).  Marland Kitchen  Cholecalciferol (VITAMIN D3) 2000 units capsule Take 2,000 Units by mouth daily.  . clobetasol cream (TEMOVATE) 9.35 % Apply 1 application topically once a week.  . Coenzyme Q10 (CO Q 10) 100 MG CAPS Take 100 mg by mouth daily.  . Cyanocobalamin (B-12) 500 MCG TABS Take 500 mcg by mouth daily.  Marland Kitchen denosumab (PROLIA) 60 MG/ML SOLN injection Inject 60 mg into the skin every 6 (six) months. Administer in upper arm, thigh, or abdomen  . folic acid (FOLVITE) 701 MCG tablet Take 400 mcg by mouth every evening.   . furosemide (LASIX) 20 MG tablet Take 1 tablet (20 mg total) by mouth daily.  . isosorbide mononitrate (IMDUR) 30 MG 24 hr tablet Take 1 tablet (30 mg total) by mouth daily.  Marland Kitchen levothyroxine (SYNTHROID,  LEVOTHROID) 137 MCG tablet Take 137 mcg by mouth daily.    . metoprolol (LOPRESSOR) 50 MG tablet Take 1 tablet (50 mg total) by mouth 2 (two) times daily.  . Multiple Vitamin (MULTIVITAMIN WITH MINERALS) TABS tablet Take 1 tablet by mouth daily.  . multivitamin-lutein (OCUVITE-LUTEIN) CAPS capsule Take 1 capsule by mouth daily.  . nitroGLYCERIN (NITROSTAT) 0.4 MG SL tablet Place 0.4 mg under the tongue every 5 (five) minutes as needed for chest pain (x 3 pills).  . Omega-3 Fatty Acids (FISH OIL) 1200 MG CAPS Take 1,200 mg by mouth 2 (two) times daily.  . rosuvastatin (CRESTOR) 40 MG tablet Take 40 mg by mouth daily.  Marland Kitchen umeclidinium-vilanterol (ANORO ELLIPTA) 62.5-25 MCG/INH AEPB Inhale 1 puff into the lungs daily.  Marland Kitchen umeclidinium-vilanterol (ANORO ELLIPTA) 62.5-25 MCG/INH AEPB Inhale 1 puff into the lungs daily.  . NONFORMULARY OR COMPOUNDED ITEM Estradiol 7.79 mg #39 applicators s: insert vaginally twice weekly. (Patient not taking: Reported on 05/05/2017)   No facility-administered encounter medications on file as of 05/05/2017.      Review of Systems  Constitutional:   No  weight loss, night sweats,  Fevers, chills +, fatigue, or  lassitude.  HEENT:   No headaches,  Difficulty swallowing,  Tooth/dental problems, or  Sore throat,                No sneezing, itching, ear ache, nasal congestion, post nasal drip,   CV:  No chest pain,  Orthopnea, PND, swelling in lower extremities, anasarca, dizziness, palpitations, syncope.   GI  No heartburn, indigestion, abdominal pain, nausea, vomiting, diarrhea, change in bowel habits, loss of appetite, bloody stools.   Resp:  No chest wall deformity  Skin: no rash or lesions.  GU: no dysuria, change in color of urine, no urgency or frequency.  No flank pain, no hematuria   MS:  No joint pain or swelling.  No decreased range of motion.  No back pain.    Physical Exam  BP 118/78 (BP Location: Right Leg, Cuff Size: Normal)   Pulse 80   Ht 5'  3" (1.6 m)   Wt 159 lb (72.1 kg)   SpO2 90%   BMI 28.17 kg/m   GEN: A/Ox3; pleasant , NAD, elderly on oxygen   HEENT:  [place]/AT,  EACs-clear, TMs-wnl, NOSE-clear, THROAT-clear, no lesions, no postnasal drip or exudate noted.   NECK:  Supple w/ fair ROM; no JVD; normal carotid impulses w/o bruits; no thyromegaly or nodules palpated; no lymphadenopathy.    RESP  decreased breath sounds in the bases ,  no accessory muscle use, no dullness to percussion  CARD:  RRR, no m/r/g, no peripheral edema, pulses intact, no cyanosis  or clubbing.  GI:   Soft & nt; nml bowel sounds; no organomegaly or masses detected.   Musco: Warm bil, no deformities or joint swelling noted.   Neuro: alert, no focal deficits noted.    Skin: Warm, no lesions or rashes    Lab Results:   BMET  BNP  Imaging: Mm Screening Breast Tomo Bilateral  Result Date: 04/29/2017 CLINICAL DATA:  Screening. EXAM: 2D DIGITAL SCREENING BILATERAL MAMMOGRAM WITH CAD AND ADJUNCT TOMO COMPARISON:  Previous exam(s). ACR Breast Density Category c: The breast tissue is heterogeneously dense, which may obscure small masses. FINDINGS: There are no findings suspicious for malignancy. Images were processed with CAD. IMPRESSION: No mammographic evidence of malignancy. A result letter of this screening mammogram will be mailed directly to the patient. RECOMMENDATION: Screening mammogram in one year. (Code:SM-B-01Y) BI-RADS CATEGORY  1: Negative. Electronically Signed   By: Claudie Revering M.D.   On: 04/29/2017 13:06     Assessment & Plan:   COPD GOLD II  Compensated on present regimen   Plan  Patient Instructions  Continue on ANORO daily , rinse after use.  Use oxygen 3l/m rest and 8l/m walking .  Continue on Lasix 20mg  daily .  Follow up Dr. Elsworth Soho  2 months and As needed   Please contact office for sooner follow up if symptoms do not improve or worsen or seek emergency care       Chronic diastolic CHF (congestive heart  failure) Appears compensated without vol overload on exam  No changes     Chronic respiratory failure with hypoxia ([place]) Cont on Oxygen       Rexene Edison, NP 05/05/2017

## 2017-05-05 NOTE — Assessment & Plan Note (Addendum)
Appears compensated without vol overload on exam  No changes

## 2017-05-05 NOTE — Assessment & Plan Note (Signed)
Cont on Oxygen  

## 2017-05-05 NOTE — Patient Instructions (Signed)
Continue on ANORO daily , rinse after use.  Use oxygen 3l/m rest and 8l/m walking .  Continue on Lasix 20mg  daily .  Follow up Dr. Elsworth Soho  2 months and As needed   Please contact office for sooner follow up if symptoms do not improve or worsen or seek emergency care

## 2017-05-05 NOTE — Assessment & Plan Note (Signed)
Compensated on present regimen   Plan  Patient Instructions  Continue on ANORO daily , rinse after use.  Use oxygen 3l/m rest and 8l/m walking .  Continue on Lasix 20mg  daily .  Follow up Dr. Elsworth Soho  2 months and As needed   Please contact office for sooner follow up if symptoms do not improve or worsen or seek emergency care

## 2017-05-12 NOTE — Progress Notes (Signed)
Reviewed & agree with plan  

## 2017-06-09 DIAGNOSIS — M79671 Pain in right foot: Secondary | ICD-10-CM | POA: Diagnosis not present

## 2017-06-09 DIAGNOSIS — Z6827 Body mass index (BMI) 27.0-27.9, adult: Secondary | ICD-10-CM | POA: Diagnosis not present

## 2017-07-18 DIAGNOSIS — E538 Deficiency of other specified B group vitamins: Secondary | ICD-10-CM | POA: Diagnosis not present

## 2017-07-18 DIAGNOSIS — N390 Urinary tract infection, site not specified: Secondary | ICD-10-CM | POA: Diagnosis not present

## 2017-07-18 DIAGNOSIS — R7301 Impaired fasting glucose: Secondary | ICD-10-CM | POA: Diagnosis not present

## 2017-07-18 DIAGNOSIS — E038 Other specified hypothyroidism: Secondary | ICD-10-CM | POA: Diagnosis not present

## 2017-07-18 DIAGNOSIS — I10 Essential (primary) hypertension: Secondary | ICD-10-CM | POA: Diagnosis not present

## 2017-07-18 DIAGNOSIS — M859 Disorder of bone density and structure, unspecified: Secondary | ICD-10-CM | POA: Diagnosis not present

## 2017-07-18 DIAGNOSIS — E784 Other hyperlipidemia: Secondary | ICD-10-CM | POA: Diagnosis not present

## 2017-07-18 DIAGNOSIS — R8299 Other abnormal findings in urine: Secondary | ICD-10-CM | POA: Diagnosis not present

## 2017-07-20 NOTE — Progress Notes (Signed)
[name] Date of Birth: 1929-01-10   History of Present Illness: [name] is seen today for followup of diastolic CHF and CAD. She has a history of coronary disease and had remote angioplasty of the mid right coronary in 1995.  Last carotid dopplers in Nov. 2016 showed 40-59% stenosis on the left. The right ICA is clear. She has a history of PAD. In October 2015 she developed increased SOB and edema. Echo showed normal systolic function with grade 2 diastolic dysfunction. Myoview study was normal. She was started on lasix daily with resolution of her symptoms and edema. She was seen  in February 2017 by Almyra Deforest PA with symptoms of SOB and palpitations. Echo showed normal EF 82-42% with diastolic dysfunction and moderate pulmonary HTN. Holter monitor showed isolated PACs and PVCs with a 4 beat run of idioventricular rhythm. Metoprolol dose was increased. D- dimer was normal.   In December 2017 she suffered a radial fracture and had ORIF. She was admitted in February 2018 with influenza A complicated by PNA. She has been receiving outpatient PT at home. Wearing oxygen 3 liters [place]. Followed by pulmonary. She is followed regularly by Dr. Scot Dock for PAD and carotid disease. Last seen in November.   On follow up today she is doing well. Is adjusting to wearing oxygen all the time.  She denies  CP, or edema. Palpitations have resolved.  Breathing is doing well. oxygen sat will drop into the 70s when she isn't wearing oxygen. 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 States claudication better since she has been on oxygen. Complains of joints hurting.   Current Outpatient Prescriptions on File Prior to Visit  Medication Sig Dispense Refill  . acetaminophen (TYLENOL) 500 MG tablet Take 1,000 mg by mouth every 6 (six) hours as needed.    . Aloe-Sodium Chloride (AYR SALINE NASAL GEL NA) Place 1 application into the nose daily as needed (dryness).    Marland Kitchen aspirin 81 MG tablet Take 81 mg by mouth  daily.      . Carboxymethylcellul-Glycerin (LUBRICATING EYE DROPS OP) Apply 1 drop to eye daily as needed (dry eyes).    . Cholecalciferol (VITAMIN D3) 2000 units capsule Take 2,000 Units by mouth daily.    . Coenzyme Q10 (CO Q 10) 100 MG CAPS Take 100 mg by mouth daily.    . Cyanocobalamin (B-12) 500 MCG TABS Take 500 mcg by mouth daily.    Marland Kitchen denosumab (PROLIA) 60 MG/ML SOLN injection Inject 60 mg into the skin every 6 (six) months. Administer in upper arm, thigh, or abdomen    . folic acid (FOLVITE) 353 MCG tablet Take 400 mcg by mouth every evening.     . furosemide (LASIX) 20 MG tablet Take 1 tablet (20 mg total) by mouth daily. 90 tablet 3  . isosorbide mononitrate (IMDUR) 30 MG 24 hr tablet Take 1 tablet (30 mg total) by mouth daily. 90 tablet 3  . levothyroxine (SYNTHROID, LEVOTHROID) 137 MCG tablet Take 137 mcg by mouth daily.      . metoprolol (LOPRESSOR) 50 MG tablet Take 1 tablet (50 mg total) by mouth 2 (two) times daily. 180 tablet 3  . Multiple Vitamin (MULTIVITAMIN WITH MINERALS) TABS tablet Take 1 tablet by mouth daily.    . multivitamin-lutein (OCUVITE-LUTEIN) CAPS capsule Take 1 capsule by mouth daily.    . nitroGLYCERIN (NITROSTAT) 0.4 MG SL tablet Place 0.4 mg under the tongue every 5 (five) minutes as needed for chest pain (  x 3 pills).    . Omega-3 Fatty Acids (FISH OIL) 1200 MG CAPS Take 1,200 mg by mouth 2 (two) times daily.    . rosuvastatin (CRESTOR) 40 MG tablet Take 20 mg by mouth daily.     Marland Kitchen umeclidinium-vilanterol (ANORO ELLIPTA) 62.5-25 MCG/INH AEPB Inhale 1 puff into the lungs daily. 2 each 0  . albuterol (PROVENTIL HFA;VENTOLIN HFA) 108 (90 Base) MCG/ACT inhaler Inhale 2 puffs into the lungs every 6 (six) hours as needed for wheezing or shortness of breath. (Patient not taking: Reported on 07/22/2017) 1 Inhaler 0   No current facility-administered medications on file prior to visit.     No Known Allergies  Past Medical History:  Diagnosis Date  . Arthritis    . B12 deficiency   . Basal cell cancer    Nose  . Carotid artery occlusion   . Cataract   . Chronic diastolic CHF (congestive heart failure) ([place])   . Colon polyps   . COPD (chronic obstructive pulmonary disease) ([place])    STAGE 2  . Coronary artery disease    status post angioplasty of the mid RCA in 1995  . Dyspnea   . First degree atrioventricular block   . History of obesity   . Hyperlipidemia   . Hypertension    well controlled  . Hyperthyroidism   . Lichen sclerosus et atrophicus of the vulva   . Osteopenia   . PAD (peripheral artery disease) ([facility])    with stable claudication  . Pulmonary fibrosis ([place])    wears 4L home O2 at baseline    Past Surgical History:  Procedure Laterality Date  . CAROTID ENDARTERECTOMY Right 2002  . CATARACT EXTRACTION W/ INTRAOCULAR LENS  IMPLANT, BILATERAL    . CHOLECYSTECTOMY  1999  . ENDARTERECTOMY     right carotid endarterectomy  . KNEE ARTHROSCOPY Left   . MENISCUS REPAIR Left 10/29/2012  . OPEN REDUCTION INTERNAL FIXATION (ORIF) DISTAL RADIAL FRACTURE Left 11/08/2016   Procedure: OPEN TREATMENT OF LEFT DISTAL RADIUS FRACTURE;  Surgeon: Milly Jakob, MD;  Location: [facility];  Service: Orthopedics;  Laterality: Left;  . REPAIR TENDONS FOOT      History  Smoking Status  . Former Smoker  . Packs/day: 1.00  . Years: 40.00  . Types: Cigarettes  . Quit date: 11/18/1993  Smokeless Tobacco  . Never Used    History  Alcohol Use No    Family History  Problem Relation Age of Onset  . Heart attack Father 39  . Hypertension Father   . Heart disease Brother        underwent coronary bypass grafting at age 23  . Hypertension Brother   . Diabetes Sister   . Breast cancer Maternal Aunt     Review of Systems: The review of systems is  as noted above. All other systems were reviewed and are negative.  Physical Exam: BP 130/74   Pulse 69   Ht 5\' 3"  (1.6 m)   Wt 158 lb (71.7 kg)   BMI 27.99 kg/m  She is an overweight white  female in no acute distress. HEENT is normal.  Neck is without JVD, adenopathy, thyromegaly. There is a right carotid bruit. She has an old CEA scar. Lungs are clear. Cardiac exam reveals a regular rate and rhythm without a gallop. There is a soft systolic ejection murmur the right upper sternal border. Abdomen is soft and nontender. She has no lower extremity edema. Pedal pulses are diminished. Feet are pink  and warm. Skin is warm and dry. She is alert oriented x3. Cranial nerves II through XII are intact.  LABORATORY DATA:  Lab Results  Component Value Date   WBC 14.1 (H) 12/28/2016   HGB 11.5 (L) 12/28/2016   HCT 36.0 12/28/2016   PLT 189 12/28/2016   GLUCOSE 114 (H) 12/29/2016   ALT 16 12/26/2016   AST 22 12/26/2016   NA 138 12/29/2016   K 4.4 12/29/2016   CL 104 12/29/2016   CREATININE 0.84 12/29/2016   BUN 28 (H) 12/29/2016   CO2 25 12/29/2016   TSH 1.12 01/03/2016   HGBA1C 5.0 12/28/2016    Labs dated 06/12/16: cholesterol 95, triglycerides 109, HDL 37, LDL 36. Dated 07/18/17: cholesterol 127, triglycerides 136, HDL 46, LDL 54. A1c 5.2%. CBC, CMET, TSH normal.   Ecg today shows NSR rate 69 with first degree AV block and nonspecific ST abnormality. No change from prior. I have personally reviewed and interpreted this study.  Echo: 12/29/15:Study Conclusions  - Left ventricle: The cavity size was normal. Wall thickness was  normal. Systolic function was normal. The estimated ejection  fraction was in the range of 60% to 65%. Features are consistent  with a pseudonormal left ventricular filling pattern, with  concomitant abnormal relaxation and increased filling pressure  (grade 2 diastolic dysfunction). Doppler parameters are  consistent with high ventricular filling pressure. - Mitral valve: Calcified annulus. Mildly thickened leaflets .  Valve area by continuity equation (using LVOT flow): 1.83 cm^2. - Pulmonary arteries: PA peak pressure: 64 mm Hg  (S).    Assessment / Plan: 1. Chronic diastolic CHF. She appears eubolemic on diuretic therapy. Weight is down one lb.    Will continue current therapy. Sodium restriction.  2. Coronary disease with remote angioplasty of the right coronary in 1995. Myoview study in Dec. 2015 showed no significant ischemia and ejection fraction of 76%. Continue medical management with aspirin, metoprolol, and ACE inhibitor. She is asymptomatic.   3.  PAD with chronic stable  claudication. ABIs in November 2017 improved to .7 on right and .77 on left. Scheduled for follow up with Dr. Scot Dock this fall.   4. Carotid arterial disease status post right carotid endarterectomy. Dopplers in November 2017 stable.   5. Hypertension, controlled.  6. Hyperlipidemia well controlled on Crestor, niacin, and fish oil.   7. COPD. On chronic oxygen. She has a prior history of heavy tobacco abuse. Followed by Dr. Elsworth Soho.  8. Pulmonary HTN secondary to #7.   I will follow up in 6 months.

## 2017-07-22 ENCOUNTER — Ambulatory Visit (INDEPENDENT_AMBULATORY_CARE_PROVIDER_SITE_OTHER): Payer: Medicare Other | Admitting: Cardiology

## 2017-07-22 ENCOUNTER — Encounter: Payer: Self-pay | Admitting: Cardiology

## 2017-07-22 VITALS — BP 130/74 | HR 69 | Ht 63.0 in | Wt 158.0 lb

## 2017-07-22 DIAGNOSIS — I5032 Chronic diastolic (congestive) heart failure: Secondary | ICD-10-CM

## 2017-07-22 DIAGNOSIS — I6523 Occlusion and stenosis of bilateral carotid arteries: Secondary | ICD-10-CM | POA: Diagnosis not present

## 2017-07-22 DIAGNOSIS — I272 Pulmonary hypertension, unspecified: Secondary | ICD-10-CM

## 2017-07-22 DIAGNOSIS — I739 Peripheral vascular disease, unspecified: Secondary | ICD-10-CM | POA: Diagnosis not present

## 2017-07-22 DIAGNOSIS — I10 Essential (primary) hypertension: Secondary | ICD-10-CM | POA: Diagnosis not present

## 2017-07-22 DIAGNOSIS — I2510 Atherosclerotic heart disease of native coronary artery without angina pectoris: Secondary | ICD-10-CM | POA: Diagnosis not present

## 2017-07-22 NOTE — Patient Instructions (Signed)
Continue your current therapy  I will see you in 6 months.   

## 2017-07-24 DIAGNOSIS — I2510 Atherosclerotic heart disease of native coronary artery without angina pectoris: Secondary | ICD-10-CM | POA: Diagnosis not present

## 2017-07-24 DIAGNOSIS — Z6826 Body mass index (BMI) 26.0-26.9, adult: Secondary | ICD-10-CM | POA: Diagnosis not present

## 2017-07-24 DIAGNOSIS — Z0000 Encounter for general adult medical examination without abnormal findings: Secondary | ICD-10-CM | POA: Diagnosis not present

## 2017-07-24 DIAGNOSIS — J9610 Chronic respiratory failure, unspecified whether with hypoxia or hypercapnia: Secondary | ICD-10-CM | POA: Diagnosis not present

## 2017-07-24 DIAGNOSIS — J189 Pneumonia, unspecified organism: Secondary | ICD-10-CM | POA: Diagnosis not present

## 2017-07-24 DIAGNOSIS — I509 Heart failure, unspecified: Secondary | ICD-10-CM | POA: Diagnosis not present

## 2017-07-24 DIAGNOSIS — Z23 Encounter for immunization: Secondary | ICD-10-CM | POA: Diagnosis not present

## 2017-07-24 DIAGNOSIS — N182 Chronic kidney disease, stage 2 (mild): Secondary | ICD-10-CM | POA: Diagnosis not present

## 2017-07-24 DIAGNOSIS — J449 Chronic obstructive pulmonary disease, unspecified: Secondary | ICD-10-CM | POA: Diagnosis not present

## 2017-07-24 DIAGNOSIS — Z1231 Encounter for screening mammogram for malignant neoplasm of breast: Secondary | ICD-10-CM | POA: Diagnosis not present

## 2017-07-24 DIAGNOSIS — Z1389 Encounter for screening for other disorder: Secondary | ICD-10-CM | POA: Diagnosis not present

## 2017-07-24 DIAGNOSIS — R600 Localized edema: Secondary | ICD-10-CM | POA: Diagnosis not present

## 2017-07-24 DIAGNOSIS — D6859 Other primary thrombophilia: Secondary | ICD-10-CM | POA: Diagnosis not present

## 2017-08-01 DIAGNOSIS — Z1212 Encounter for screening for malignant neoplasm of rectum: Secondary | ICD-10-CM | POA: Diagnosis not present

## 2017-08-07 ENCOUNTER — Ambulatory Visit: Payer: Medicare Other | Admitting: Pulmonary Disease

## 2017-08-08 ENCOUNTER — Encounter: Payer: Self-pay | Admitting: Adult Health

## 2017-08-08 ENCOUNTER — Ambulatory Visit (INDEPENDENT_AMBULATORY_CARE_PROVIDER_SITE_OTHER): Payer: Medicare Other | Admitting: Adult Health

## 2017-08-08 DIAGNOSIS — I5032 Chronic diastolic (congestive) heart failure: Secondary | ICD-10-CM

## 2017-08-08 DIAGNOSIS — J9611 Chronic respiratory failure with hypoxia: Secondary | ICD-10-CM | POA: Diagnosis not present

## 2017-08-08 DIAGNOSIS — J449 Chronic obstructive pulmonary disease, unspecified: Secondary | ICD-10-CM

## 2017-08-08 DIAGNOSIS — I6523 Occlusion and stenosis of bilateral carotid arteries: Secondary | ICD-10-CM | POA: Diagnosis not present

## 2017-08-08 MED ORDER — ALBUTEROL SULFATE HFA 108 (90 BASE) MCG/ACT IN AERS: 2.0000 | INHALATION_SPRAY | Freq: Four times a day (QID) | RESPIRATORY_TRACT | 1 refills | Status: DC | PRN

## 2017-08-08 MED ORDER — UMECLIDINIUM-VILANTEROL 62.5-25 MCG/INH IN AEPB: 1.0000 | INHALATION_SPRAY | Freq: Every day | RESPIRATORY_TRACT | 3 refills | Status: DC

## 2017-08-08 NOTE — Assessment & Plan Note (Signed)
Appears euvolemic without flare .

## 2017-08-08 NOTE — Patient Instructions (Signed)
Begin Mucinex Twice daily  As needed  Cough /congrestion  Flutter valve 3 times a day As needed  Cough.  Continue on ANORO daily . Rinse after use.  Continue on oxygen 3l/m rest , 6l/m walking.  Follow up with Dr. Elsworth Soho  In 4 months and As needed

## 2017-08-08 NOTE — Assessment & Plan Note (Signed)
Cont on O2 .  

## 2017-08-08 NOTE — Progress Notes (Signed)
@Patient  ID: [name], female    DOB: [birth year], 83 y.o.   MRN: [id number]  Chief Complaint  Patient presents with  . Follow-up    COPD     Referring provider: [name], MD  HPI: 83 yo former smoker followed for COPD, O2 RF . Has pulmonary HTN  Hx of polycythemia followed by hematology -undergoes phlebotomy Has D CHF and CAD    Significant tests/ events  Chest x-ray shows increased interstitial prominence compared to her older films from 2009 HRCT 01/2016 >> no evidence of interstitial lung disease, mild bibasal scarring  PFTs 01/2016 -FEV1 1.15 - 64%, no BD response, DLCO 22%  Echo - PAP 64, gr 2 DD, nml LV fn  08/08/2017 Follow up : COPD /O2 RF /Pulmonary HTN  Patient returns for a three-month follow-up. She remains on ANORO daily for COPD  Overall is doing okay with no flare of wheezing or dyspnea.  Does have some cough , congestion . Mucus is clear /white. . Uses flutter valve.  She remains independent , lives alone.   Has PUlmonary HTN  And D CHF . On lasix 20mg  daily . No increased edema.   She remains on oxygen 3 L at rest, 6l/m with walking.   Prevnar and Pneumovax are up-to-date. Flu shot is utd.     No Known Allergies  Immunization History  Administered Date(s) Administered  . Influenza, High Dose Seasonal PF 08/18/2016, 08/08/2017  . Influenza-Unspecified 08/16/2015    Past Medical History:  Diagnosis Date  . Arthritis   . B12 deficiency   . Basal cell cancer    Nose  . Carotid artery occlusion   . Cataract   . Chronic diastolic CHF (congestive heart failure) ([place])   . Colon polyps   . COPD (chronic obstructive pulmonary disease) ([place])    STAGE 2  . Coronary artery disease    status post angioplasty of the mid RCA in 1995  . Dyspnea   . First degree atrioventricular block   . History of obesity   . Hyperlipidemia   . Hypertension    well controlled  . Hyperthyroidism   . Lichen sclerosus et atrophicus of the vulva   .  Osteopenia   . PAD (peripheral artery disease) ([facility])    with stable claudication  . Pulmonary fibrosis ([place])    wears 4L home O2 at baseline    Tobacco History: History  Smoking Status  . Former Smoker  . Packs/day: 1.00  . Years: 40.00  . Types: Cigarettes  . Quit date: 11/18/1993  Smokeless Tobacco  . Never Used   Counseling given: Not Answered   Outpatient Encounter Prescriptions as of 08/08/2017  Medication Sig  . acetaminophen (TYLENOL) 500 MG tablet Take 1,000 mg by mouth every 6 (six) hours as needed.  Marland Kitchen albuterol (PROVENTIL HFA;VENTOLIN HFA) 108 (90 Base) MCG/ACT inhaler Inhale 2 puffs into the lungs every 6 (six) hours as needed for wheezing or shortness of breath.  . Aloe-Sodium Chloride (AYR SALINE NASAL GEL NA) Place 1 application into the nose daily as needed (dryness).  Marland Kitchen aspirin 81 MG tablet Take 81 mg by mouth daily.    . Carboxymethylcellul-Glycerin (LUBRICATING EYE DROPS OP) Apply 1 drop to eye daily as needed (dry eyes).  . Cholecalciferol (VITAMIN D3) 2000 units capsule Take 2,000 Units by mouth daily.  . Coenzyme Q10 (CO Q 10) 100 MG CAPS Take 100 mg by mouth daily.  . Cyanocobalamin (B-12) 500 MCG TABS Take 500  mcg by mouth daily.  Marland Kitchen denosumab (PROLIA) 60 MG/ML SOLN injection Inject 60 mg into the skin every 6 (six) months. Administer in upper arm, thigh, or abdomen  . folic acid (FOLVITE) 322 MCG tablet Take 400 mcg by mouth every evening.   . furosemide (LASIX) 20 MG tablet Take 1 tablet (20 mg total) by mouth daily.  . isosorbide mononitrate (IMDUR) 30 MG 24 hr tablet Take 1 tablet (30 mg total) by mouth daily.  Marland Kitchen levothyroxine (SYNTHROID, LEVOTHROID) 137 MCG tablet Take 137 mcg by mouth daily.    . metoprolol (LOPRESSOR) 50 MG tablet Take 1 tablet (50 mg total) by mouth 2 (two) times daily.  . Multiple Vitamin (MULTIVITAMIN WITH MINERALS) TABS tablet Take 1 tablet by mouth daily.  . multivitamin-lutein (OCUVITE-LUTEIN) CAPS capsule Take 1 capsule by mouth  daily.  . nitroGLYCERIN (NITROSTAT) 0.4 MG SL tablet Place 0.4 mg under the tongue every 5 (five) minutes as needed for chest pain (x 3 pills).  . Omega-3 Fatty Acids (FISH OIL) 1200 MG CAPS Take 1,200 mg by mouth 2 (two) times daily.  . rosuvastatin (CRESTOR) 40 MG tablet Take 20 mg by mouth daily.   Marland Kitchen umeclidinium-vilanterol (ANORO ELLIPTA) 62.5-25 MCG/INH AEPB Inhale 1 puff into the lungs daily.  . [DISCONTINUED] albuterol (PROVENTIL HFA;VENTOLIN HFA) 108 (90 Base) MCG/ACT inhaler Inhale 2 puffs into the lungs every 6 (six) hours as needed for wheezing or shortness of breath.  . [DISCONTINUED] umeclidinium-vilanterol (ANORO ELLIPTA) 62.5-25 MCG/INH AEPB Inhale 1 puff into the lungs daily.   No facility-administered encounter medications on file as of 08/08/2017.      Review of Systems  Constitutional:   No  weight loss, night sweats,  Fevers, chills,  +fatigue, or  lassitude.  HEENT:   No headaches,  Difficulty swallowing,  Tooth/dental problems, or  Sore throat,                No sneezing, itching, ear ache, nasal congestion, post nasal drip,   CV:  No chest pain,  Orthopnea, PND, , anasarca, dizziness, palpitations, syncope.   GI  No heartburn, indigestion, abdominal pain, nausea, vomiting, diarrhea, change in bowel habits, loss of appetite, bloody stools.   Resp:   No coughing up of blood.  No change in color of mucus.  No wheezing.  No chest wall deformity  Skin: no rash or lesions.  GU: no dysuria, change in color of urine, no urgency or frequency.  No flank pain, no hematuria   MS:  No joint pain or swelling.  No decreased range of motion.  No back pain.    Physical Exam  BP 116/76 (BP Location: Left Arm, Cuff Size: Normal)   Pulse 74   Ht 5\' 4"  (1.626 m)   Wt 158 lb 12.8 oz (72 kg)   SpO2 94%   BMI 27.26 kg/m   GEN: A/Ox3; pleasant , NAD, elderly, on O2    HEENT:  [place]/AT,  EACs-clear, TMs-wnl, NOSE-clear, THROAT-clear, no lesions, no postnasal drip or exudate  noted.   NECK:  Supple w/ fair ROM; no JVD; normal carotid impulses w/o bruits; no thyromegaly or nodules palpated; no lymphadenopathy.    RESP  Decreased BS in bases . no accessory muscle use, no dullness to percussion  CARD:  RRR, no m/r/g, tr to none  peripheral edema, pulses intact, no cyanosis or clubbing.  GI:   Soft & nt; nml bowel sounds; no organomegaly or masses detected.   Musco: Warm bil, no deformities  or joint swelling noted.   Neuro: alert, no focal deficits noted.    Skin: Warm, no lesions or rashes    Lab Results:  CBC   BMET   Imaging: No results found.   Assessment & Plan:   COPD GOLD II  Compensated on present regimen   Plan  Patient Instructions  Begin Mucinex Twice daily  As needed  Cough /congrestion  Flutter valve 3 times a day As needed  Cough.  Continue on ANORO daily . Rinse after use.  Continue on oxygen 3l/m rest , 6l/m walking.  Follow up with Dr. Elsworth Soho  In 4 months and As needed        Chronic respiratory failure with hypoxia ([place]) Cont on O2   Chronic diastolic CHF (congestive heart failure) Appears euvolemic without flare .      Rexene Edison, NP 08/08/2017

## 2017-08-08 NOTE — Assessment & Plan Note (Signed)
Compensated on present regimen   Plan  Patient Instructions  Begin Mucinex Twice daily  As needed  Cough /congrestion  Flutter valve 3 times a day As needed  Cough.  Continue on ANORO daily . Rinse after use.  Continue on oxygen 3l/m rest , 6l/m walking.  Follow up with Dr. Elsworth Soho  In 4 months and As needed

## 2017-08-13 ENCOUNTER — Encounter: Payer: Self-pay | Admitting: Gynecology

## 2017-08-13 ENCOUNTER — Ambulatory Visit (INDEPENDENT_AMBULATORY_CARE_PROVIDER_SITE_OTHER): Payer: Medicare Other | Admitting: Gynecology

## 2017-08-13 VITALS — BP 120/76

## 2017-08-13 DIAGNOSIS — N762 Acute vulvitis: Secondary | ICD-10-CM | POA: Diagnosis not present

## 2017-08-13 DIAGNOSIS — I6523 Occlusion and stenosis of bilateral carotid arteries: Secondary | ICD-10-CM

## 2017-08-13 MED ORDER — NYSTATIN 100000 UNIT/GM EX CREA
1.0000 | TOPICAL_CREAM | Freq: Two times a day (BID) | CUTANEOUS | 1 refills | Status: DC
Start: 2017-08-13 — End: 2018-01-27

## 2017-08-13 NOTE — Patient Instructions (Signed)
Apply the clobetasol cream to the irritated areas every night  Apply the newly prescribed nystatin cream twice daily to the irritated areas.  Follow up if the symptoms persist or worsen.

## 2017-08-13 NOTE — Progress Notes (Signed)
    [name] 08/01/1929 [id number]        83 y.o.  G3P3 former patient Dr. Toney Rakes who presents with several weeks of vulvar irritation. Has a history of lichen sclerosus in the past with clobetasol prescription. She started using it every other night but did not seem to get relief. No discharge or odor. No urinary symptoms such as frequency, dysuria, urgency, low back pain, fever or chills.  Past medical history,surgical history, problem list, medications, allergies, family history and social history were all reviewed and documented in the EPIC chart.  Directed ROS with pertinent positives and negatives documented in the history of present illness/assessment and plan.  Exam: Caryn Bee assistant Vitals:   08/13/17 1141  BP: 120/76   General appearance:  Normal External BUS vagina with atrophic changes. Generalized vulvitis and perianal irritation consistent with fungal. Some cracking at the folds of the labia majora bilaterally. Vagina atrophic. Cervix atrophic. Uterus difficult to palpate but no masses or tenderness.  Assessment/Plan:  83 y.o. G3P3 with several weeks of vulvar irritation. Exam is consistent with fungal given the perianal component. No significant discharge. We'll treat with nystatin cream twice daily to the affected areas. Continue with clobetasol every night at bedtime. Follow up if symptoms persist, worsen or recur. Patient and I discussed whether she needs to continue with annual gynecologic exams. Dr. Joylene Draft follows her for her general health and also does breast exams. We discussed discussed at her age in the absence of symptoms but she does not need to see me on an annual basis but I remain available if she has any issues or complaints and she is comfortable with this.   Anastasio Auerbach MD, 11:57 AM 08/13/2017

## 2017-08-24 ENCOUNTER — Other Ambulatory Visit: Payer: Self-pay | Admitting: Physician Assistant

## 2017-09-14 ENCOUNTER — Telehealth: Payer: Self-pay | Admitting: Physician Assistant

## 2017-09-14 ENCOUNTER — Emergency Department (HOSPITAL_COMMUNITY)
Admission: EM | Admit: 2017-09-14 | Discharge: 2017-09-14 | Disposition: A | Discharge status: Home / Self Care | Payer: Medicare Other | Attending: Emergency Medicine | Admitting: Emergency Medicine

## 2017-09-14 ENCOUNTER — Encounter (HOSPITAL_COMMUNITY): Payer: Self-pay

## 2017-09-14 ENCOUNTER — Emergency Department (HOSPITAL_COMMUNITY): Payer: Medicare Other

## 2017-09-14 ENCOUNTER — Ambulatory Visit (HOSPITAL_COMMUNITY)
Admission: EM | Admit: 2017-09-14 | Discharge: 2017-09-14 | Disposition: A | Discharge status: Home / Self Care | Payer: Medicare Other

## 2017-09-14 DIAGNOSIS — R002 Palpitations: Secondary | ICD-10-CM | POA: Insufficient documentation

## 2017-09-14 DIAGNOSIS — J849 Interstitial pulmonary disease, unspecified: Secondary | ICD-10-CM | POA: Diagnosis not present

## 2017-09-14 DIAGNOSIS — Z85828 Personal history of other malignant neoplasm of skin: Secondary | ICD-10-CM | POA: Insufficient documentation

## 2017-09-14 DIAGNOSIS — Z79899 Other long term (current) drug therapy: Secondary | ICD-10-CM | POA: Insufficient documentation

## 2017-09-14 DIAGNOSIS — I5032 Chronic diastolic (congestive) heart failure: Secondary | ICD-10-CM | POA: Diagnosis not present

## 2017-09-14 DIAGNOSIS — J449 Chronic obstructive pulmonary disease, unspecified: Secondary | ICD-10-CM | POA: Insufficient documentation

## 2017-09-14 DIAGNOSIS — I110 Hypertensive heart disease with heart failure: Secondary | ICD-10-CM | POA: Insufficient documentation

## 2017-09-14 DIAGNOSIS — Z87891 Personal history of nicotine dependence: Secondary | ICD-10-CM | POA: Diagnosis not present

## 2017-09-14 DIAGNOSIS — Z7982 Long term (current) use of aspirin: Secondary | ICD-10-CM | POA: Insufficient documentation

## 2017-09-14 DIAGNOSIS — I2510 Atherosclerotic heart disease of native coronary artery without angina pectoris: Secondary | ICD-10-CM | POA: Insufficient documentation

## 2017-09-14 LAB — CBC
HEMATOCRIT: 45.5 % (ref 36.0–46.0)
Hemoglobin: 14.9 g/dL (ref 12.0–15.0)
MCH: 27 pg (ref 26.0–34.0)
MCHC: 32.7 g/dL (ref 30.0–36.0)
MCV: 82.6 fL (ref 78.0–100.0)
PLATELETS: 177 10*3/uL (ref 150–400)
RBC: 5.51 MIL/uL — ABNORMAL HIGH (ref 3.87–5.11)
RDW: 14.3 % (ref 11.5–15.5)
WBC: 8.5 10*3/uL (ref 4.0–10.5)

## 2017-09-14 LAB — BASIC METABOLIC PANEL
Anion gap: 9 (ref 5–15)
BUN: 18 mg/dL (ref 6–20)
CO2: 25 mmol/L (ref 22–32)
Calcium: 9.5 mg/dL (ref 8.9–10.3)
Chloride: 106 mmol/L (ref 101–111)
Creatinine, Ser: 0.94 mg/dL (ref 0.44–1.00)
GFR calc Af Amer: >60 mL/min (ref >60)
GFR, EST NON AFRICAN AMERICAN: 53 mL/min — AB (ref >60)
Glucose, Bld: 127 mg/dL — ABNORMAL HIGH (ref 65–99)
POTASSIUM: 4.1 mmol/L (ref 3.5–5.1)
SODIUM: 140 mmol/L (ref 135–145)

## 2017-09-14 LAB — I-STAT TROPONIN, ED: Troponin i, poc: 0.01 ng/mL (ref 0.00–0.08)

## 2017-09-14 LAB — BRAIN NATRIURETIC PEPTIDE: B Natriuretic Peptide: 221.7 pg/mL — ABNORMAL HIGH (ref 0.0–100.0)

## 2017-09-14 NOTE — ED Notes (Signed)
Family at bedside. 

## 2017-09-14 NOTE — ED Triage Notes (Signed)
Patient complains of palpitations since 0500, no pain. States that she was directed to ED for EKG. Patient has CHF and pulmonary hypertension. sats 83% on 2.5 liters. Alert and oriented

## 2017-09-14 NOTE — Discharge Instructions (Signed)
Return if you are having any problems. 

## 2017-09-14 NOTE — ED Notes (Signed)
Pt reports having palpitation today which started at 0500, it resolved, she went to church.  When she returned home from church palpitations recurred and lasted longer than the first episode.  Pt is A&Ox 4.  Denies any palpitations at this time.  Pt reports having dizziness with the palpitation but reports mild weakness.

## 2017-09-14 NOTE — ED Provider Notes (Signed)
[facility] EMERGENCY DEPARTMENT Provider Note   CSN: [id number] Arrival date & time: 09/14/17  1255     History   Chief Complaint Palpitations HPI [name] is a 83 y.o. female.  The history is provided by the patient.  She has a history of congestive heart failure, pulmonary hypertension, COPD.  This morning, she had an episode of feeling like her heart was up in her throat and she could feel the heart racing and pounding.  This lasted several hours before resolving.  She went to church, but had to subsequent episodes.  One lasted about 15 minutes, the other lasted about 2 hours.  At no time did she have any chest pain, heaviness, tightness, pressure.  She denied any change in her baseline dyspnea.  She denied nausea, vomiting, diaphoresis.  She has never had an episode like this before.  Past Medical History:  Diagnosis Date  . Arthritis   . B12 deficiency   . Basal cell cancer    Nose  . Carotid artery occlusion   . Cataract   . Chronic diastolic CHF (congestive heart failure) ([place])   . Colon polyps   . COPD (chronic obstructive pulmonary disease) ([place])    STAGE 2  . Coronary artery disease    status post angioplasty of the mid RCA in 1995  . Dyspnea   . First degree atrioventricular block   . History of obesity   . Hyperlipidemia   . Hypertension    well controlled  . Hyperthyroidism   . Lichen sclerosus et atrophicus of the vulva   . Osteopenia   . PAD (peripheral artery disease) ([facility])    with stable claudication  . Pulmonary fibrosis ([place])    wears 4L home O2 at baseline    Patient Active Problem List   Diagnosis Date Noted  . HCAP (healthcare-associated pneumonia) 02/19/2017  . Acute on chronic respiratory failure ([place]) 12/27/2016  . Tachycardia 12/27/2016  . Influenza with pneumonia 12/26/2016  . Chronic respiratory failure with hypoxia ([place]) 07/10/2016  . Polycythemia, secondary 07/05/2016  . Pulmonary hypertension ([place])  02/06/2016  . COPD GOLD II  02/05/2016  . Chronic diastolic CHF (congestive heart failure) ([place]) 11/01/2014  . CHF (congestive heart failure), NYHA class II ([place]) 10/18/2014  . Carotid stenosis 09/21/2014  . Osteopenia 10/27/2013  . Aftercare following surgery of the circulatory system, [place] 09/15/2013  . 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 Vaginal atrophy 05/20/2013  . Peripheral vascular disease ([place]) 02/10/2013  . Occlusion and stenosis of carotid artery without mention of cerebral infarction 09/16/2012  . Pain in limb 09/16/2012  . Hypertension   . Hyperlipidemia   . PAD (peripheral artery disease) ([place])   . Coronary artery disease     Past Surgical History:  Procedure Laterality Date  . CAROTID ENDARTERECTOMY Right 2002  . CATARACT EXTRACTION W/ INTRAOCULAR LENS  IMPLANT, BILATERAL    . CHOLECYSTECTOMY  1999  . ENDARTERECTOMY     right carotid endarterectomy  . KNEE ARTHROSCOPY Left   . MENISCUS REPAIR Left 10/29/2012  . OPEN REDUCTION INTERNAL FIXATION (ORIF) DISTAL RADIAL FRACTURE Left 11/08/2016   Procedure: OPEN TREATMENT OF LEFT DISTAL RADIUS FRACTURE;  Surgeon: Milly Jakob, MD;  Location: [place];  Service: Orthopedics;  Laterality: Left;  . REPAIR TENDONS FOOT      OB History    Gravida Para Term Preterm AB Living   3 3       3    SAB TAB Ectopic  Multiple Live Births                   Home Medications    Prior to Admission medications   Medication Sig Start Date End Date Taking? Authorizing Provider  acetaminophen (TYLENOL) 500 MG tablet Take 1,000 mg by mouth every 6 (six) hours as needed.    [provider]  albuterol (PROVENTIL HFA;VENTOLIN HFA) 108 (90 Base) MCG/ACT inhaler Inhale 2 puffs into the lungs every 6 (six) hours as needed for wheezing or shortness of breath. 08/08/17   Parrett, Tammy S, NP  Aloe-Sodium Chloride (AYR SALINE NASAL GEL NA) Place 1 application into the nose daily as needed (dryness).    [provider]    aspirin 81 MG tablet Take 81 mg by mouth daily.      [provider]  Carboxymethylcellul-Glycerin (LUBRICATING EYE DROPS OP) Apply 1 drop to eye daily as needed (dry eyes).    [provider]  Cholecalciferol (VITAMIN D3) 2000 units capsule Take 2,000 Units by mouth daily.    [provider]  Coenzyme Q10 (CO Q 10) 100 MG CAPS Take 100 mg by mouth daily.    [provider]  Cyanocobalamin (B-12) 500 MCG TABS Take 500 mcg by mouth daily.    [provider]  denosumab (PROLIA) 60 MG/ML SOLN injection Inject 60 mg into the skin every 6 (six) months. Administer in upper arm, thigh, or abdomen    [provider]  folic acid (FOLVITE) 440 MCG tablet Take 400 mcg by mouth every evening.     [provider]  furosemide (LASIX) 20 MG tablet Take 1 tablet (20 mg total) by mouth daily. 01/30/17   Martinique, Peter M, MD  isosorbide mononitrate (IMDUR) 30 MG 24 hr tablet TAKE 1 TABLET(30 MG) BY MOUTH DAILY 08/25/17   Martinique, Peter M, MD  levothyroxine (SYNTHROID, LEVOTHROID) 137 MCG tablet Take 137 mcg by mouth daily.      [provider]  metoprolol (LOPRESSOR) 50 MG tablet Take 1 tablet (50 mg total) by mouth 2 (two) times daily. 01/29/17   Martinique, Peter M, MD  Multiple Vitamin (MULTIVITAMIN WITH MINERALS) TABS tablet Take 1 tablet by mouth daily.    [provider]  multivitamin-lutein (OCUVITE-LUTEIN) CAPS capsule Take 1 capsule by mouth daily.    [provider]  nitroGLYCERIN (NITROSTAT) 0.4 MG SL tablet Place 0.4 mg under the tongue every 5 (five) minutes as needed for chest pain (x 3 pills).    [provider]  nystatin cream (MYCOSTATIN) Apply 1 application topically 2 (two) times daily. 08/13/17   Fontaine, Belinda Block, MD  Omega-3 Fatty Acids (FISH OIL) 1200 MG CAPS Take 1,200 mg by mouth 2 (two) times daily.    [provider]  rosuvastatin (CRESTOR) 40 MG tablet Take 20 mg by mouth daily.      [provider]  umeclidinium-vilanterol (ANORO ELLIPTA) 62.5-25 MCG/INH AEPB Inhale 1 puff into the lungs daily. 08/08/17   Parrett, Fonnie Mu, NP    Family History Family History  Problem Relation Age of Onset  . Heart attack Father 93  . Hypertension Father   . Heart disease Brother        underwent coronary bypass grafting at age 29  . Hypertension Brother   . Diabetes Sister   . Breast cancer Maternal Aunt     Social History Social History  Substance Use Topics  . Smoking status: Former Smoker    Packs/day: 1.00  Years: 40.00    Types: Cigarettes    Quit date: 11/18/1993  . Smokeless tobacco: Never Used  . Alcohol use No     Allergies   Patient has no known allergies.   Review of Systems Review of Systems  All other systems reviewed and are negative.    Physical Exam Updated Vital Signs BP 133/71   Pulse 83   Temp 98 F (36.7 C) (Oral)   Resp 20   Ht 5\' 4"  (1.626 m)   Wt 71.4 kg (157 lb 6.4 oz)   SpO2 (!) 82%   BMI 27.02 kg/m   Physical Exam  Nursing note and vitals reviewed.  83 year old female, resting comfortably and in no acute distress. Vital signs are normal. Oxygen saturation is 94%, which is normal (on monitor while I am in the room). Head is normocephalic and atraumatic. PERRLA, EOMI. Oropharynx is clear. Neck is nontender and supple without adenopathy or JVD. Back is nontender and there is no CVA tenderness. Lungs are clear without rales, wheezes, or rhonchi. Chest is nontender. Heart has regular rate and rhythm without murmur. Abdomen is soft, flat, nontender without masses or hepatosplenomegaly and peristalsis is normoactive. Extremities have no cyanosis or edema, full range of motion is present. Skin is warm and dry without rash. Neurologic: Mental status is normal, cranial nerves are intact, there are no motor or sensory deficits.   ED Treatments / Results  Labs (all labs ordered are listed, but only abnormal results are  displayed) Labs Reviewed  BASIC METABOLIC PANEL - Abnormal; Notable for the following:       Result Value   Glucose, Bld 127 (*)    GFR calc non Af Amer 53 (*)    All other components within normal limits  CBC - Abnormal; Notable for the following:    RBC 5.51 (*)    All other components within normal limits  BRAIN NATRIURETIC PEPTIDE - Abnormal; Notable for the following:    B Natriuretic Peptide 221.7 (*)    All other components within normal limits  I-STAT TROPONIN, ED    EKG  EKG Interpretation  Date/Time:  Sunday September 14 2017 13:04:38 EDT Ventricular Rate:  86 PR Interval:  256 QRS Duration: 86 QT Interval:  390 QTC Calculation: 466 R Axis:   94 Text Interpretation:  Sinus rhythm with 1st degree A-V block Rightward axis Borderline ECG When compared with ECG of 12/26/2016, HEART RATE has decreased Confirmed by Delora Fuel ([id number]) on 09/14/2017 1:54:42 PM       Radiology Dg Chest 2 View  Result Date: 09/14/2017 CLINICAL DATA:  Rapid heart rate, EXAM: CHEST  2 VIEW COMPARISON:  Chest x-ray dated 02/19/2017. FINDINGS: Heart size and mediastinal contours are stable. Atherosclerotic changes noted at the aortic arch. Lungs are hyperexpanded indicating COPD. Coarse lung markings again noted bilaterally suggesting associated chronic interstitial lung disease and/or chronic bronchitic change. No new airspace opacity to suggest a developing pneumonia. No pleural effusion or pneumothorax seen. Mild degenerative spurring within the thoracic spine. No acute or suspicious osseous finding. IMPRESSION: 1. No active cardiopulmonary disease. No evidence of pneumonia or pulmonary edema. 2. Hyperexpanded lungs indicating COPD. 3. Chronic interstitial lung disease and/or chronic bronchitic change. 4. Aortic atherosclerosis. Electronically Signed   By: Franki Cabot M.D.   On: 09/14/2017 14:21    Procedures Procedures (including critical care time)  Medications Ordered in ED Medications -  No data to display   Initial Impression / Assessment and Plan /  ED Course  I have reviewed the triage vital signs and the nursing notes.  Pertinent labs & imaging results that were available during my care of the patient were reviewed by me and considered in my medical decision making (see chart for details).  Palpitations which had resolved by the time she got to the emergency department.  Consider paroxysmal supraventricular tachycardia versus paroxysmal atrial fibrillation.  No arrhythmias seen while in the ED.  She will need Holter monitor or event monitor to identify her arrhythmia.  She is discharged with instructions to follow-up with her cardiologist tomorrow to set up appropriate outpatient cardiac monitoring.  Return precautions discussed.  Final Clinical Impressions(s) / ED Diagnoses   Final diagnoses:  Heart palpitations    New Prescriptions New Prescriptions   No medications on file     Delora Fuel, MD 59/27/63 [id number]

## 2017-09-14 NOTE — Telephone Encounter (Signed)
Paged by answering service. Patient's pulse is fluctuating between 70-140s.She does feels palpitation and dizziness. No chest pain, syncope or LE edema. Chronic shortness of breath due to COPD. Advised to go to ER/Urgent to get EKG to r/o AFib. May take extra metoprolol 50mg .

## 2017-09-15 ENCOUNTER — Telehealth: Payer: Self-pay | Admitting: Cardiology

## 2017-09-15 DIAGNOSIS — R002 Palpitations: Secondary | ICD-10-CM

## 2017-09-15 NOTE — Telephone Encounter (Signed)
Returned call to patient.She stated she has been having palpitations.Stated she went to [facility] ED yesterday and ED Dr.advised she needs to wear a monitor.Stated she does not have palpitations every day.Advised our scheduler will call back to schedule 30 day event monitor and follow up appointment with Dr.[name] after monitor.

## 2017-09-15 NOTE — Telephone Encounter (Signed)
Patient was recently seen in ED and was told to contact our office about getting a monitor. No orders placed for monitor. Please advise.

## 2017-09-15 NOTE — Telephone Encounter (Signed)
New Message  Pt call requesting to speak with RN about a f/u appt. Pt states she would like to speak with RN to see if she would need a f/u appt from the ER visit. Please call back to discuss

## 2017-09-17 DIAGNOSIS — E038 Other specified hypothyroidism: Secondary | ICD-10-CM | POA: Diagnosis not present

## 2017-09-29 ENCOUNTER — Ambulatory Visit (INDEPENDENT_AMBULATORY_CARE_PROVIDER_SITE_OTHER): Payer: Medicare Other

## 2017-09-29 DIAGNOSIS — R002 Palpitations: Secondary | ICD-10-CM

## 2017-10-06 ENCOUNTER — Telehealth: Payer: Self-pay | Admitting: Cardiology

## 2017-10-06 NOTE — Telephone Encounter (Signed)
She has worn it for 3 weeks now so if no arrhythmia so far she can discontinue.  Carlous Olivares Martinique MD, [facility]

## 2017-10-06 NOTE — Telephone Encounter (Signed)
Patient states that monitor is "driving her crazy."

## 2017-10-06 NOTE — Telephone Encounter (Signed)
Received call from patient Dr.[name]'s recommendations given.

## 2017-10-06 NOTE — Telephone Encounter (Signed)
Pt of Dr. 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 Pt states "I really don't think I need to wear the monitor", reports preceding her ED visit she had consumed a large coffee from [employer] which wasn't typical of her habits. She reports she hasn't had issues before or since that day.  States lots of technical problems with her monitor, monitor making poor contact and not picking up beats, skin irritation, etc. She did call monitoring company this AM and got some help w/ troubleshooting, including suggestion to move monitor leads around. I affirmed this advice.  I suggested to remain on monitor for now, will see if we can review monitor strips to date and if Dr. Martinique can advise on whether OK to discontinue.

## 2017-10-06 NOTE — Telephone Encounter (Signed)
Returned call to patient no answer.LMTC. 

## 2017-10-14 ENCOUNTER — Telehealth: Payer: Self-pay | Admitting: Cardiology

## 2017-10-14 NOTE — Telephone Encounter (Signed)
LMTCB Released results to MyChart

## 2017-10-14 NOTE — Telephone Encounter (Signed)
New message  Pt verbalized that she is returning call for the rn results

## 2017-10-14 NOTE — Telephone Encounter (Signed)
Notes recorded by Martinique, Peter M, MD on 10/14/2017 at 7:51 AM EST Event monitor is benign. Just isolated PACs.  Peter Martinique MD, [facility]

## 2017-10-15 ENCOUNTER — Ambulatory Visit (INDEPENDENT_AMBULATORY_CARE_PROVIDER_SITE_OTHER)
Admission: RE | Admit: 2017-10-15 | Discharge: 2017-10-15 | Disposition: A | Discharge status: Home / Self Care | Payer: Medicare Other | Source: Ambulatory Visit | Attending: Vascular Surgery | Admitting: Vascular Surgery

## 2017-10-15 ENCOUNTER — Ambulatory Visit (INDEPENDENT_AMBULATORY_CARE_PROVIDER_SITE_OTHER): Payer: Medicare Other | Admitting: Family

## 2017-10-15 ENCOUNTER — Encounter: Payer: Self-pay | Admitting: Family

## 2017-10-15 ENCOUNTER — Ambulatory Visit (HOSPITAL_COMMUNITY)
Admission: RE | Admit: 2017-10-15 | Discharge: 2017-10-15 | Disposition: A | Discharge status: Home / Self Care | Payer: Medicare Other | Source: Ambulatory Visit | Attending: Vascular Surgery | Admitting: Vascular Surgery

## 2017-10-15 VITALS — BP 160/80 | HR 65 | Temp 96.9°F | Resp 18 | Wt 155.0 lb

## 2017-10-15 DIAGNOSIS — Z9889 Other specified postprocedural states: Secondary | ICD-10-CM

## 2017-10-15 DIAGNOSIS — Z87891 Personal history of nicotine dependence: Secondary | ICD-10-CM

## 2017-10-15 DIAGNOSIS — I771 Stricture of artery: Secondary | ICD-10-CM

## 2017-10-15 DIAGNOSIS — I6522 Occlusion and stenosis of left carotid artery: Secondary | ICD-10-CM | POA: Diagnosis not present

## 2017-10-15 DIAGNOSIS — I6521 Occlusion and stenosis of right carotid artery: Secondary | ICD-10-CM

## 2017-10-15 DIAGNOSIS — I779 Disorder of arteries and arterioles, unspecified: Secondary | ICD-10-CM | POA: Diagnosis not present

## 2017-10-15 LAB — VAS US CAROTID
LCCAPDIAS: 15 cm/s
LEFT ECA DIAS: 0 cm/s
LICADDIAS: -24 cm/s
LICAPDIAS: -41 cm/s
LICAPSYS: -166 cm/s
Left CCA dist dias: -12 cm/s
Left CCA dist sys: -82 cm/s
Left CCA prox sys: 78 cm/s
Left ICA dist sys: -105 cm/s
RCCAPDIAS: 14 cm/s
RIGHT CCA MID DIAS: 21 cm/s
RIGHT ECA DIAS: 0 cm/s
RIGHT VERTEBRAL DIAS: -18 cm/s
Right CCA prox sys: 84 cm/s
Right cca dist sys: -111 cm/s

## 2017-10-15 NOTE — Progress Notes (Signed)
[facility]  HISTORY AND PHYSICAL   MRN : [id number]  History of Present Illness:   [name] is a 83 y.o. female patient that Dr. Scot Dock has been monitoring status post right CEA on 03/09/2001 and for PAD. She returns today for follow up.  She did have evidence of chronic venous insufficiency and she was encouraged to elevate her legs intermittently, is wearing compression hose.  She can walk as far as she wants with no claudication sx's, her walking is limited by her dyspnea and bringing her O2 tank with her, 6L/min during the day, 2L at night.   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 She had a torn meniscus in left knee, was repaired Dec. 2013, and left leg feels better.  She has no history of TIA or stroke symptoms. Specifically the patient denies a history of amaurosis fugax or monocular blindness, unilateral facial drooping, hemiplegia, or receptive or expressive aphasia.   She denies history of MI but had ischemic heart disease and had a cardiac stent placed in 1995, quit smoking in 1995, she does not have DM. She states that she has known PVC's, can go weeks without noticing any, then have lots of PVC's at other times, states her PCP and cardiologist are aware of this.  She was diagnosed with CHF in November 2015.  She smoked x 40 years, quit in 1995. She started using 2L O2/[place] in August 2017 for COPD and pulmonary hypertension; states her room air oxygen saturation was 67%.  Pt meds include: Statin :Yes Betablocker: Yes ASA: Yes Other anticoagulants/antiplatelets: no   Current Outpatient Medications  Medication Sig Dispense Refill  . acetaminophen (TYLENOL) 500 MG tablet Take 1,000 mg by mouth every 6 (six) hours as needed.    Marland Kitchen albuterol (PROVENTIL HFA;VENTOLIN HFA) 108 (90 Base) MCG/ACT inhaler Inhale 2 puffs into the lungs every 6 (six)  hours as needed for wheezing or shortness of breath. 1 Inhaler 1  . Aloe-Sodium Chloride (AYR SALINE NASAL GEL NA) Place 1 application into the nose daily as needed (dryness).    Marland Kitchen aspirin 81 MG tablet Take 81 mg by mouth daily.      . Carboxymethylcellul-Glycerin (LUBRICATING EYE DROPS OP) Apply 1 drop to eye daily as needed (dry eyes).    . Cholecalciferol (VITAMIN D3) 2000 units capsule Take 2,000 Units by mouth daily.    . Coenzyme Q10 (CO Q 10) 100 MG CAPS Take 100 mg by mouth daily.    . Cyanocobalamin (B-12) 500 MCG TABS Take 500 mcg by mouth daily.    Marland Kitchen denosumab (PROLIA) 60 MG/ML SOLN injection Inject 60 mg into the skin every 6 (six) months. Administer in upper arm, thigh, or abdomen    . folic acid (FOLVITE) 026 MCG tablet Take 400 mcg by mouth every evening.     . furosemide (LASIX) 20 MG tablet Take 1 tablet (20 mg total) by mouth daily. 90 tablet 3  . isosorbide mononitrate (IMDUR) 30 MG 24 hr tablet TAKE 1 TABLET(30 MG) BY MOUTH DAILY 90 tablet 0  . levothyroxine (SYNTHROID, LEVOTHROID) 137 MCG tablet Take 137 mcg by mouth daily.      . metoprolol (LOPRESSOR) 50 MG tablet Take 1 tablet (50 mg total) by mouth 2 (two) times daily. 180 tablet 3  . Multiple Vitamin (MULTIVITAMIN WITH MINERALS) TABS tablet Take 1 tablet by mouth daily.    Marland Kitchen  multivitamin-lutein (OCUVITE-LUTEIN) CAPS capsule Take 1 capsule by mouth daily.    . nitroGLYCERIN (NITROSTAT) 0.4 MG SL tablet Place 0.4 mg under the tongue every 5 (five) minutes as needed for chest pain (x 3 pills).    . nystatin cream (MYCOSTATIN) Apply 1 application topically 2 (two) times daily. 30 g 1  . Omega-3 Fatty Acids (FISH OIL) 1200 MG CAPS Take 1,200 mg by mouth 2 (two) times daily.    . rosuvastatin (CRESTOR) 40 MG tablet Take 20 mg by mouth daily.     Marland Kitchen umeclidinium-vilanterol (ANORO ELLIPTA) 62.5-25 MCG/INH AEPB Inhale 1 puff into the lungs daily. 1 each 3   No current facility-administered medications for this visit.     Past  Medical History:  Diagnosis Date  . Arthritis   . B12 deficiency   . Basal cell cancer    Nose  . Carotid artery occlusion   . Cataract   . Chronic diastolic CHF (congestive heart failure) ([place])   . Colon polyps   . COPD (chronic obstructive pulmonary disease) ([place])    STAGE 2  . Coronary artery disease    status post angioplasty of the mid RCA in 1995  . Dyspnea   . First degree atrioventricular block   . History of obesity   . Hyperlipidemia   . Hypertension    well controlled  . Hyperthyroidism   . Lichen sclerosus et atrophicus of the vulva   . Osteopenia   . PAD (peripheral artery disease) ([facility])    with stable claudication  . Pulmonary fibrosis ([facility])    wears 4L home O2 at baseline    Social History Social History   Tobacco Use  . Smoking status: Former Smoker    Packs/day: 1.00    Years: 40.00    Pack years: 40.00    Types: Cigarettes    Last attempt to quit: 11/18/1993    Years since quitting: 23.9  . Smokeless tobacco: Never Used  Substance Use Topics  . Alcohol use: No    Alcohol/week: 0.0 oz  . Drug use: No    Family History Family History  Problem Relation Age of Onset  . Heart attack Father 51  . Hypertension Father   . Heart disease Brother        underwent coronary bypass grafting at age 24  . Hypertension Brother   . Diabetes Sister   . Breast cancer Maternal Aunt     Surgical History Past Surgical History:  Procedure Laterality Date  . CAROTID ENDARTERECTOMY Right 2002  . CATARACT EXTRACTION W/ INTRAOCULAR LENS  IMPLANT, BILATERAL    . CHOLECYSTECTOMY  1999  . ENDARTERECTOMY     right carotid endarterectomy  . KNEE ARTHROSCOPY Left   . MENISCUS REPAIR Left 10/29/2012  . OPEN REDUCTION INTERNAL FIXATION (ORIF) DISTAL RADIAL FRACTURE Left 11/08/2016   Procedure: OPEN TREATMENT OF LEFT DISTAL RADIUS FRACTURE;  Surgeon: Milly Jakob, MD;  Location: [facility];  Service: Orthopedics;  Laterality: Left;  . REPAIR TENDONS FOOT      No  Known Allergies  Current Outpatient Medications  Medication Sig Dispense Refill  . acetaminophen (TYLENOL) 500 MG tablet Take 1,000 mg by mouth every 6 (six) hours as needed.    Marland Kitchen albuterol (PROVENTIL HFA;VENTOLIN HFA) 108 (90 Base) MCG/ACT inhaler Inhale 2 puffs into the lungs every 6 (six) hours as needed for wheezing or shortness of breath. 1 Inhaler 1  . Aloe-Sodium Chloride (AYR SALINE NASAL GEL NA) Place 1 application into the  nose daily as needed (dryness).    Marland Kitchen aspirin 81 MG tablet Take 81 mg by mouth daily.      . Carboxymethylcellul-Glycerin (LUBRICATING EYE DROPS OP) Apply 1 drop to eye daily as needed (dry eyes).    . Cholecalciferol (VITAMIN D3) 2000 units capsule Take 2,000 Units by mouth daily.    . Coenzyme Q10 (CO Q 10) 100 MG CAPS Take 100 mg by mouth daily.    . Cyanocobalamin (B-12) 500 MCG TABS Take 500 mcg by mouth daily.    Marland Kitchen denosumab (PROLIA) 60 MG/ML SOLN injection Inject 60 mg into the skin every 6 (six) months. Administer in upper arm, thigh, or abdomen    . folic acid (FOLVITE) 709 MCG tablet Take 400 mcg by mouth every evening.     . furosemide (LASIX) 20 MG tablet Take 1 tablet (20 mg total) by mouth daily. 90 tablet 3  . isosorbide mononitrate (IMDUR) 30 MG 24 hr tablet TAKE 1 TABLET(30 MG) BY MOUTH DAILY 90 tablet 0  . levothyroxine (SYNTHROID, LEVOTHROID) 137 MCG tablet Take 137 mcg by mouth daily.      . metoprolol (LOPRESSOR) 50 MG tablet Take 1 tablet (50 mg total) by mouth 2 (two) times daily. 180 tablet 3  . Multiple Vitamin (MULTIVITAMIN WITH MINERALS) TABS tablet Take 1 tablet by mouth daily.    . multivitamin-lutein (OCUVITE-LUTEIN) CAPS capsule Take 1 capsule by mouth daily.    . nitroGLYCERIN (NITROSTAT) 0.4 MG SL tablet Place 0.4 mg under the tongue every 5 (five) minutes as needed for chest pain (x 3 pills).    . nystatin cream (MYCOSTATIN) Apply 1 application topically 2 (two) times daily. 30 g 1  . Omega-3 Fatty Acids (FISH OIL) 1200 MG CAPS Take  1,200 mg by mouth 2 (two) times daily.    . rosuvastatin (CRESTOR) 40 MG tablet Take 20 mg by mouth daily.     Marland Kitchen umeclidinium-vilanterol (ANORO ELLIPTA) 62.5-25 MCG/INH AEPB Inhale 1 puff into the lungs daily. 1 each 3   No current facility-administered medications for this visit.      REVIEW OF SYSTEMS: See HPI for pertinent positives and negatives.  Physical Examination Vitals:   10/15/17 1056 10/15/17 1100  BP: 138/72 (!) 160/80  Pulse: 65   Resp: 18   Temp: (!) 96.9 F (36.1 C)   TempSrc: Oral   SpO2: 91%   Weight: 155 lb (70.3 kg)    Body mass index is 26.61 kg/m.  General:  WDWN in NAD, female. Gait: slow, pulling rolling O2 tank HENT: WNL Eyes: PERRLA Pulmonary: Breathing slightly labored at rest, good air movement in all fields, no rales, rhonchi, or wheezing. Using 6L/[place] supplemental O2. Cardiac: Regular rhythm, no detected murmur  Abdomen: soft, NT, no palpated masses  VASCULAR EXAM  Carotid Bruits Left Right   Negative Positive    Abdominal aortic pulse is not palpable. Radial pulses right is 2+, left is not palpable. Left brachial pulse is 2+ palpable.    VASCULAR EXAM: Extremitieswithout ischemic changes, without Gangrene; without open wounds. Hemosiderin deposits left lateral calf, no peripheral edema. Compression hose in place.      LE Pulses LEFT RIGHT   FEMORAL  palpable  palpable    POPLITEAL not palpable  not palpable   POSTERIOR TIBIAL not palpable  not palpable    DORSALIS PEDIS  ANTERIOR TIBIAL not palpable  not palpable    Skin: No rash, no ulcers, no cellulitis.  Musculoskeletal: no muscle wasting or atrophy.  Neurologic:A&O X 3; Appropriate Affect; SENSATION: normal; MOTOR FUNCTION: 5/5 Symmetric, CN 2-12  intact, speech is fluent/normal    ASSESSMENT:  AMANAT HACKEL is a 83 y.o. female who is s/p right CEA on 03/09/2001 and is also followed for PAD. She has no history of stroke or TIA, does have known left subclavian stenosis but no steal symptoms. She can walk as far as she wants with no claudication symptoms in her legs.  Her walking is limited by pulling a rolling O2 tank.  She has a daily exercise routine at home.  She has no signs of ischemia in her legs/feet.     DATA  Carotid Duplex (10/15/17): Right ICA : with history of carotid endarterectomy, no hyperplasia or hemodynamically significant plaque. Left ICA: 40-59% stenosis. >50% left ECA stenosis. Retrograde left vertebral artery and stenotic left subclavian artery. Antegrade right vertebral artery with normal waveforms.  Slight progression of left ICA stenosis compared to the exam on 10-02-16.    ABI (Date: 10/15/2017):  R:   ABI: 0.71 (was 0.70 on 10-02-16),   PT: mono  DP: mono  TBI:  0.55 (was 0.73)  L:   ABI: 0.79 (was 0.77),   PT: mono  DP: mono  TBI: 0.48 (was 0.73)  Stable bilateral ABI with moderate disease bilaterally.    PLAN:   Continue daily home exercise routine.  Based on today's exam and non-invasive vascular lab results, the patient will follow up in 1 year with the following tests: carotid duplex and ABI's. I advised her to notify us if she develops concerns re the circulation in her feet or legs.   I discussed in depth with the patient the nature of atherosclerosis, and emphasized the importance of maximal medical management including strict control of blood pressure, blood glucose, and lipid levels, obtaining regular exercise, and cessation of smoking.  The patient is aware that without maximal medical management the underlying atherosclerotic disease process will progress, limiting the benefit of any interventions.  The patient was given information about stroke prevention and  what symptoms should prompt the patient to seek immediate medical care.  The patient was given information about PAD including signs, symptoms, treatment, what symptoms should prompt the patient to seek immediate medical care, and risk reduction measures to take.  Thank you for allowing [place] to participate in this patient's care.  Clemon Chambers, RN, MSN, FNP-C [facility] Office: [phone]  Clinic MD: Scot Dock 10/15/2017 1:44 PM

## 2017-10-15 NOTE — Patient Instructions (Signed)
Stroke Prevention Some health problems and behaviors may make it more likely for you to have a stroke. Below are ways to lessen your risk of having a stroke.  Be active for at least 30 minutes on most or all days.  Do not smoke. Try not to be around others who smoke.  Do not drink too much alcohol. ? Do not have more than 2 drinks a day if you are a man. ? Do not have more than 1 drink a day if you are a woman and are not pregnant.  Eat healthy foods, such as fruits and vegetables. If you were put on a specific diet, follow the diet as told.  Keep your cholesterol levels under control through diet and medicines. Look for foods that are low in saturated fat, trans fat, cholesterol, and are high in fiber.  If you have diabetes, follow all diet plans and take your medicine as told.  Ask your doctor if you need treatment to lower your blood pressure. If you have high blood pressure (hypertension), follow all diet plans and take your medicine as told by your doctor.  If you are 18-39 years old, have your blood pressure checked every 3-5 years. If you are age 40 or older, have your blood pressure checked every year.  Keep a healthy weight. Eat foods that are low in calories, salt, saturated fat, trans fat, and cholesterol.  Do not take drugs.  Avoid birth control pills, if this applies. Talk to your doctor about the risks of taking birth control pills.  Talk to your doctor if you have sleep problems (sleep apnea).  Take all medicine as told by your doctor. ? You may be told to take aspirin or blood thinner medicine. Take this medicine as told by your doctor. ? Understand your medicine instructions.  Make sure any other conditions you have are being taken care of.  Get help right away if:  You suddenly lose feeling (you feel numb) or have weakness in your face, arm, or leg.  Your face or eyelid hangs down to one side.  You suddenly feel confused.  You have trouble talking  (aphasia) or understanding what people are saying.  You suddenly have trouble seeing in one or both eyes.  You suddenly have trouble walking.  You are dizzy.  You lose your balance or your movements are clumsy (uncoordinated).  You suddenly have a very bad headache and you do not know the cause.  You have new chest pain.  Your heart feels like it is fluttering or skipping a beat (irregular heartbeat). Do not wait to see if the symptoms above go away. Get help right away. Call your local emergency services (911 in [place] Do not drive yourself to the hospital. This information is not intended to replace advice given to you by your health care provider. Make sure you discuss any questions you have with your health care provider. Document Released: 05/05/2012 Document Revised: 04/11/2016 Document Reviewed: 05/07/2013 Elsevier Interactive Patient Education  2018 [employer]     Peripheral Vascular Disease Peripheral vascular disease (PVD) is a disease of the blood vessels that are not part of your heart and brain. A simple term for PVD is poor circulation. In most cases, PVD narrows the blood vessels that carry blood from your heart to the rest of your body. This can result in a decreased supply of blood to your arms, legs, and internal organs, like your stomach or kidneys. However, it most often affects   a person's lower legs and feet. There are two types of PVD.  Organic PVD. This is the more common type. It is caused by damage to the structure of blood vessels.  Functional PVD. This is caused by conditions that make blood vessels contract and tighten (spasm).  Without treatment, PVD tends to get worse over time. PVD can also lead to acute ischemic limb. This is when an arm or limb suddenly has trouble getting enough blood. This is a medical emergency. Follow these instructions at home:  Take medicines only as told by your doctor.  Do not use any tobacco products, including  cigarettes, chewing tobacco, or electronic cigarettes. If you need help quitting, ask your doctor.  Lose weight if you are overweight, and maintain a healthy weight as told by your doctor.  Eat a diet that is low in fat and cholesterol. If you need help, ask your doctor.  Exercise regularly. Ask your doctor for some good activities for you.  Take good care of your feet. ? Wear comfortable shoes that fit well. ? Check your feet often for any cuts or sores. Contact a doctor if:  You have cramps in your legs while walking.  You have leg pain when you are at rest.  You have coldness in a leg or foot.  Your skin changes.  You are unable to get or have an erection (erectile dysfunction).  You have cuts or sores on your feet that are not healing. Get help right away if:  Your arm or leg turns cold and blue.  Your arms or legs become red, warm, swollen, painful, or numb.  You have chest pain or trouble breathing.  You suddenly have weakness in your face, arm, or leg.  You become very confused or you cannot speak.  You suddenly have a very bad headache.  You suddenly cannot see. This information is not intended to replace advice given to you by your health care provider. Make sure you discuss any questions you have with your health care provider. Document Released: 01/29/2010 Document Revised: 04/11/2016 Document Reviewed: 04/14/2014 Elsevier Interactive Patient Education  2017 [employer] Inc.  

## 2017-10-17 NOTE — Telephone Encounter (Signed)
[id number] 

## 2017-11-21 ENCOUNTER — Other Ambulatory Visit: Payer: Self-pay | Admitting: *Deleted

## 2017-11-21 MED ORDER — ISOSORBIDE MONONITRATE ER 30 MG PO TB24: ORAL_TABLET | ORAL | 2 refills | Status: AC

## 2017-11-22 NOTE — Progress Notes (Signed)
[name] Date of Birth: [birth year]   History of Present Illness: [name] is seen today for followup of diastolic CHF and CAD. She has a history of coronary disease and had remote angioplasty of the mid right coronary in 1995.   She has a history of PAD. In October 2015 she developed increased SOB and edema. Echo showed normal systolic function with grade 2 diastolic dysfunction. Myoview study was normal. She was started on lasix daily with resolution of her symptoms and edema. She was seen  in February 2017 by Almyra Deforest PA with symptoms of SOB and palpitations. Echo showed normal EF 40-97% with diastolic dysfunction and moderate pulmonary HTN. Holter monitor showed isolated PACs and PVCs with a 4 beat run of idioventricular rhythm. Metoprolol dose was increased. D- dimer was normal.   She is followed regularly by Dr. Scot Dock for PAD and carotid disease. Carotid dopplers on October 16, 3531 showed 99-24% LICA stenosis. Patent right CEA site. Chronic left subclavian occlusion. LE arterial dopplers were unchanged with moderate bilateral PAD.   In October she was seen in the ED with complaints of periodic palpitations. She was in NSR and labs were OK. An outpatient 30 day event monitor was arranged and was benign showing only isolated PACs.   On follow up today she is doing well. She is wearing oxygen continually. She misses her swimming because she cannot take her oxygen off without her sats dropping.   She denies  CP, or edema. Palpitations have resolved.  Breathing is doing well.  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 She does note some discomfort in her left biceps when she is using her arm above her head. Otherwise no significant claudication. No increase in edema. She is wearing support hose.   Current Outpatient Medications on File Prior to Visit  Medication Sig Dispense Refill  . acetaminophen (TYLENOL) 500 MG tablet Take 1,000 mg by mouth every 6 (six) hours as needed.    Marland Kitchen  albuterol (PROVENTIL HFA;VENTOLIN HFA) 108 (90 Base) MCG/ACT inhaler Inhale 2 puffs into the lungs every 6 (six) hours as needed for wheezing or shortness of breath. 1 Inhaler 1  . Aloe-Sodium Chloride (AYR SALINE NASAL GEL NA) Place 1 application into the nose daily as needed (dryness).    Marland Kitchen aspirin 81 MG tablet Take 81 mg by mouth daily.      . Carboxymethylcellul-Glycerin (LUBRICATING EYE DROPS OP) Apply 1 drop to eye daily as needed (dry eyes).    . Cholecalciferol (VITAMIN D3) 2000 units capsule Take 2,000 Units by mouth daily.    . Coenzyme Q10 (CO Q 10) 100 MG CAPS Take 100 mg by mouth daily.    . Cyanocobalamin (B-12) 500 MCG TABS Take 500 mcg by mouth daily.    Marland Kitchen denosumab (PROLIA) 60 MG/ML SOLN injection Inject 60 mg into the skin every 6 (six) months. Administer in upper arm, thigh, or abdomen    . folic acid (FOLVITE) 268 MCG tablet Take 400 mcg by mouth every evening.     . furosemide (LASIX) 20 MG tablet Take 1 tablet (20 mg total) by mouth daily. 90 tablet 3  . isosorbide mononitrate (IMDUR) 30 MG 24 hr tablet TAKE 1 TABLET(30 MG) BY MOUTH DAILY 90 tablet 2  . levothyroxine (SYNTHROID, LEVOTHROID) 137 MCG tablet Take 137 mcg by mouth daily.      . metoprolol (LOPRESSOR) 50 MG tablet Take 1 tablet (50 mg total) by mouth 2 (two) times daily. [place]  tablet 3  . Multiple Vitamin (MULTIVITAMIN WITH MINERALS) TABS tablet Take 1 tablet by mouth daily.    . multivitamin-lutein (OCUVITE-LUTEIN) CAPS capsule Take 1 capsule by mouth daily.    . nitroGLYCERIN (NITROSTAT) 0.4 MG SL tablet Place 0.4 mg under the tongue every 5 (five) minutes as needed for chest pain (x 3 pills).    . nystatin cream (MYCOSTATIN) Apply 1 application topically 2 (two) times daily. 30 g 1  . Omega-3 Fatty Acids (FISH OIL) 1200 MG CAPS Take 1,200 mg by mouth 2 (two) times daily.    . rosuvastatin (CRESTOR) 40 MG tablet Take 20 mg by mouth daily.     Marland Kitchen umeclidinium-vilanterol (ANORO ELLIPTA) 62.5-25 MCG/INH AEPB Inhale 1  puff into the lungs daily. 1 each 3   No current facility-administered medications on file prior to visit.     No Known Allergies  Past Medical History:  Diagnosis Date  . Arthritis   . B12 deficiency   . Basal cell cancer    Nose  . Carotid artery occlusion   . Cataract   . Chronic diastolic CHF (congestive heart failure) ([place])   . Colon polyps   . COPD (chronic obstructive pulmonary disease) ([place])    STAGE 2  . Coronary artery disease    status post angioplasty of the mid RCA in 1995  . Dyspnea   . First degree atrioventricular block   . History of obesity   . Hyperlipidemia   . Hypertension    well controlled  . Hyperthyroidism   . Lichen sclerosus et atrophicus of the vulva   . Osteopenia   . PAD (peripheral artery disease) ([facility])    with stable claudication  . Pulmonary fibrosis ([place])    wears 4L home O2 at baseline    Past Surgical History:  Procedure Laterality Date  . CAROTID ENDARTERECTOMY Right 2002  . CATARACT EXTRACTION W/ INTRAOCULAR LENS  IMPLANT, BILATERAL    . CHOLECYSTECTOMY  1999  . ENDARTERECTOMY     right carotid endarterectomy  . KNEE ARTHROSCOPY Left   . MENISCUS REPAIR Left 10/29/2012  . OPEN REDUCTION INTERNAL FIXATION (ORIF) DISTAL RADIAL FRACTURE Left 11/08/2016   Procedure: OPEN TREATMENT OF LEFT DISTAL RADIUS FRACTURE;  Surgeon: Milly Jakob, MD;  Location: [facility];  Service: Orthopedics;  Laterality: Left;  . REPAIR TENDONS FOOT      Social History   Tobacco Use  Smoking Status Former Smoker  . Packs/day: 1.00  . Years: 40.00  . Pack years: 40.00  . Types: Cigarettes  . Last attempt to quit: 11/18/1993  . Years since quitting: 24.0  Smokeless Tobacco Never Used    Social History   Substance and Sexual Activity  Alcohol Use No  . Alcohol/week: 0.0 oz    Family History  Problem Relation Age of Onset  . Heart attack Father 18  . Hypertension Father   . Heart disease Brother        underwent coronary bypass grafting at  age 53  . Hypertension Brother   . Diabetes Sister   . Breast cancer Maternal Aunt     Review of Systems: The review of systems is  as noted above. All other systems were reviewed and are negative.  Physical Exam: BP 112/60   Pulse 73   Ht 5\' 4"  (1.626 m)   Wt 158 lb 9.6 oz (71.9 kg)   BMI 27.22 kg/m  GENERAL:  Well appearing, elderly female HEENT:  PERRL, EOMI, sclera are clear. Oropharynx  is clear. NECK:  No jugular venous distention, carotid upstroke brisk and symmetric, right carotid bruit, old left CEA scar. no thyromegaly or adenopathy LUNGS:  Clear to auscultation bilaterally CHEST:  Unremarkable HEART:  RRR,  PMI not displaced or sustained,S1 and S2 within normal limits, no S3, no S4: no clicks, no rubs, soft systolic murmur RUSB ABD:  Soft, nontender. BS +, no masses or bruits. No hepatomegaly, no splenomegaly EXT:  Pedal pulses are poor. , no edema, no cyanosis no clubbing SKIN:  Warm and dry.  No rashes NEURO:  Alert and oriented x 3. Cranial nerves II through XII intact. PSYCH:  Cognitively intact    LABORATORY DATA:  Lab Results  Component Value Date   WBC 8.5 09/14/2017   HGB 14.9 09/14/2017   HCT 45.5 09/14/2017   PLT 177 09/14/2017   GLUCOSE 127 (H) 09/14/2017   ALT 16 12/26/2016   AST 22 12/26/2016   NA 140 09/14/2017   K 4.1 09/14/2017   CL 106 09/14/2017   CREATININE 0.94 09/14/2017   BUN 18 09/14/2017   CO2 25 09/14/2017   TSH 1.12 01/03/2016   HGBA1C 5.0 12/28/2016    Labs dated 06/12/16: cholesterol 95, triglycerides 109, HDL 37, LDL 36. Dated 07/18/17: cholesterol 127, triglycerides 136, HDL 46, LDL 54. A1c 5.2%. CBC, CMET, TSH normal.    Echo: 12/29/15:Study Conclusions  - Left ventricle: The cavity size was normal. Wall thickness was  normal. Systolic function was normal. The estimated ejection  fraction was in the range of 60% to 65%. Features are consistent  with a pseudonormal left ventricular filling pattern, with   concomitant abnormal relaxation and increased filling pressure  (grade 2 diastolic dysfunction). Doppler parameters are  consistent with high ventricular filling pressure. - Mitral valve: Calcified annulus. Mildly thickened leaflets .  Valve area by continuity equation (using LVOT flow): 1.83 cm^2. - Pulmonary arteries: PA peak pressure: 64 mm Hg (S).  Event monitor 09/29/17:Study Highlights    Normal sinus rhythm  Isolated PACs     Assessment / Plan: 1. Chronic diastolic CHF. She has no significant edema.    Will continue current therapy with lasix 20 mg daily. Sodium restriction.  2. Coronary disease with remote angioplasty of the right coronary in 1995. Myoview study in Dec. 2015 showed no significant ischemia and ejection fraction of 76%. Continue medical management with aspirin, metoprolol, and ACE inhibitor. She is asymptomatic.   3.  PAD with chronic stable  claudication. ABIs in November 2018 unchanged from prior. Chronic left subclavian occlusion with mild left arm claudication.   4. Carotid arterial disease status post right carotid endarterectomy. Dopplers in November 2018 stable.   5. Hypertension, controlled.  6. Hyperlipidemia well controlled on Crestor and fish oil.   7. COPD. On chronic oxygen. She has a prior history of heavy tobacco abuse. Quit 1995. Followed by Dr. Elsworth Soho.  8. Pulmonary HTN secondary to #7.   9. Palpitations. Prior event monitor was benign. Just PACs  I will follow up in 6 months.

## 2017-11-24 ENCOUNTER — Ambulatory Visit (INDEPENDENT_AMBULATORY_CARE_PROVIDER_SITE_OTHER): Payer: Medicare Other | Admitting: Cardiology

## 2017-11-24 ENCOUNTER — Encounter: Payer: Self-pay | Admitting: Cardiology

## 2017-11-24 VITALS — BP 112/60 | HR 73 | Ht 64.0 in | Wt 158.6 lb

## 2017-11-24 DIAGNOSIS — I2510 Atherosclerotic heart disease of native coronary artery without angina pectoris: Secondary | ICD-10-CM | POA: Diagnosis not present

## 2017-11-24 DIAGNOSIS — I771 Stricture of artery: Secondary | ICD-10-CM | POA: Diagnosis not present

## 2017-11-24 DIAGNOSIS — I779 Disorder of arteries and arterioles, unspecified: Secondary | ICD-10-CM

## 2017-11-24 DIAGNOSIS — R002 Palpitations: Secondary | ICD-10-CM

## 2017-11-24 DIAGNOSIS — I5032 Chronic diastolic (congestive) heart failure: Secondary | ICD-10-CM

## 2017-11-24 DIAGNOSIS — I10 Essential (primary) hypertension: Secondary | ICD-10-CM

## 2017-11-24 NOTE — Patient Instructions (Signed)
Continue your current therapy  I will see you in 6 months.   

## 2017-12-01 ENCOUNTER — Encounter: Payer: Self-pay | Admitting: Pulmonary Disease

## 2017-12-01 ENCOUNTER — Ambulatory Visit (INDEPENDENT_AMBULATORY_CARE_PROVIDER_SITE_OTHER): Payer: Medicare Other | Admitting: Pulmonary Disease

## 2017-12-01 VITALS — BP 124/62 | HR 76 | Ht 64.0 in | Wt 158.0 lb

## 2017-12-01 DIAGNOSIS — J9611 Chronic respiratory failure with hypoxia: Secondary | ICD-10-CM | POA: Diagnosis not present

## 2017-12-01 DIAGNOSIS — I272 Pulmonary hypertension, unspecified: Secondary | ICD-10-CM

## 2017-12-01 DIAGNOSIS — J449 Chronic obstructive pulmonary disease, unspecified: Secondary | ICD-10-CM

## 2017-12-01 DIAGNOSIS — I779 Disorder of arteries and arterioles, unspecified: Secondary | ICD-10-CM

## 2017-12-01 NOTE — Assessment & Plan Note (Signed)
Also help titrate oxygen during exercise as needed

## 2017-12-01 NOTE — Patient Instructions (Signed)
Pulmonary rehab program Stay on Anoro

## 2017-12-01 NOTE — Assessment & Plan Note (Signed)
Not a candidate for therapies. We discussed signs of worsening. Referral to pulmonary rehab

## 2017-12-01 NOTE — Progress Notes (Signed)
   Subjective:    Patient ID: [name], female    DOB: 1929-05-19, 84 y.o.   MRN: [id number]  HPI  84 yo former smoker followed for COPD on home O@ with severe pulmonary HTN  Hx of polycythemia followed by hematology -undergoes phlebotomy Has D CHF and CAD  She uses up to 3 L of oxygen at rest and 6 L on exertion  Chief Complaint  Patient presents with  . Follow-up    4 month follow for COPD. Patient states she feels ok. Per daughter, she had a weak episode during travel.     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  She still desaturates sometimes to as low as 70% on walking but recovers quickly. She is compliant with Lasix but still has occasional pulmonary edema. Occasional cough with clear sputum production. She is up-to-date with her flu shot -had an episode of influenza pneumonia in 2017 She is compliant with Anoro and inquires about trilogy  Accompanied by her daughter and has a couple of questions which we clarified   Significant tests/ events  Chest x-ray shows increased interstitial prominence compared to her older films from 2009 HRCT 01/2016 >> no evidence of interstitial lung disease, mild bibasal scarring  PFTs 01/2016 -FEV1 1.15 - 64%, no BD response, DLCO 22%  Echo - PAP 64, gr 2 DD, nml LV fn  Review of Systems neg for any significant sore throat, dysphagia, itching, sneezing, nasal congestion or excess/ purulent secretions, fever, chills, sweats, unintended wt loss, pleuritic or exertional cp, hempoptysis, orthopnea pnd or change in chronic leg swelling. Also denies presyncope, palpitations, heartburn, abdominal pain, nausea, vomiting, diarrhea or change in bowel or urinary habits, dysuria,hematuria, rash, arthralgias, visual complaints, headache, numbness weakness or ataxia.     Objective:   Physical Exam  Gen. Pleasant, well-nourished, in no distress ENT - no thrush, no post nasal drip Neck: No JVD,  no thyromegaly, no carotid bruits Lungs: no use of accessory muscles, no dullness to percussion, clear without rales or rhonchi  Cardiovascular: Rhythm regular, heart sounds  normal, no murmurs or gallops, 1+ peripheral edema Musculoskeletal: No deformities, no cyanosis or clubbing        Assessment & Plan:

## 2017-12-01 NOTE — Assessment & Plan Note (Signed)
Stay on Anoro  This seems to be moderate degree and hypoxia seems to be mainly due to pulmonary hypertension

## 2017-12-13 ENCOUNTER — Other Ambulatory Visit: Payer: Self-pay | Admitting: Adult Health

## 2017-12-23 DIAGNOSIS — H903 Sensorineural hearing loss, bilateral: Secondary | ICD-10-CM | POA: Diagnosis not present

## 2018-01-01 ENCOUNTER — Telehealth (HOSPITAL_COMMUNITY): Payer: Self-pay

## 2018-01-01 NOTE — Telephone Encounter (Signed)
Patient returned phone call and is interested in the program - scheduled orientation on 01/12/2018 at 1:30pm. Patient will attend the 10:30am exc class.

## 2018-01-01 NOTE — Telephone Encounter (Signed)
Attempted to call patient in regards to Pulmonary rehab - lm on vm °

## 2018-01-12 ENCOUNTER — Encounter (HOSPITAL_COMMUNITY)
Admission: RE | Admit: 2018-01-12 | Discharge: 2018-01-12 | Disposition: A | Discharge status: Home / Self Care | Payer: Medicare Other | Source: Ambulatory Visit | Attending: Pulmonary Disease | Admitting: Pulmonary Disease

## 2018-01-12 ENCOUNTER — Encounter (HOSPITAL_COMMUNITY): Payer: Self-pay

## 2018-01-12 VITALS — BP 196/63 | HR 87 | Resp 18 | Ht 63.75 in | Wt 157.1 lb

## 2018-01-12 DIAGNOSIS — J449 Chronic obstructive pulmonary disease, unspecified: Secondary | ICD-10-CM

## 2018-01-12 NOTE — Progress Notes (Signed)
S: No CP or SOB out of the ordinary  O: Well appearing, NAD Heart: RRR, Nl S1/S2 and -M/R/G. Lung: Distant but clear Abdomen: Soft, NT, ND and +BS Ext: -edema and -tenderness  A/P: 84 year old female with history of pulmonary HTN and COPD presenting for pulmonary rehab.  Able to walk 100 feet without stopping and one flight of stairs.  Increase O2 to 10 L during exercise and 6L at rest.  Ok to begin pulmonary rehab as program is designed.  Will continue to follow up with her.  Rush Farmer, M.D. [facility] Pulmonary/Critical Care Medicine. Pager: [phone]. After hours pager: [phone].

## 2018-01-12 NOTE — Progress Notes (Signed)
[name] 84 y.o. female Pulmonary Rehab Orientation Note Patient arrived today in Cardiac and Pulmonary Rehab for orientation to Pulmonary Rehab. She was transported from [employer] via wheel chair. She does carry portable oxygen. Per pt, she uses oxygen continuously. Color good, skin warm and dry. Patient is oriented to time and place. Patient's medical history, psychosocial health, and medications reviewed. Psychosocial assessment reveals pt lives alone. 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 Pt hobbies include reading and working crossword puzzles. Pt reports her stress level is low. She states she has always and continues to "love life". Pt does not exhibit any signs of depression. PHQ2/9 score 0/na. Pt shows good  coping skills with positive outlook. She is offered emotional support and reassurance. Will continue to monitor and evaluate progress toward psychosocial goal(s) of maintaining a healthy mindset regarding her increased need for oxygen as she increases her physical exertion. Physical assessment reveals heart rate is normal, breath sounds clear to auscultation, no wheezes, rales, or rhonchi. Grip strength equal, strong. Distal pulses palpable. Patient reports she does take medications as prescribed. Patient states she follows a Regular diet. The patient reports no specific efforts to gain or lose weight. Patient's weight will be monitored closely. Demonstration and practice of PLB using pulse oximeter. Patient able to return demonstration satisfactorily. Safety and hand hygiene in the exercise area reviewed with patient. Patient voices understanding of the information reviewed. Department expectations discussed with patient and achievable goals were set. The patient shows enthusiasm about attending the program and we look forward to working with this nice lady. The patient is scheduled for a 6 min  walk test on 01/15/18 and to begin exercise on 01/22/18 at 1030.   45 minutes was spent on a variety of activities such as assessment of the patient, obtaining baseline data including height, weight, BMI, and grip strength, verifying medical history, allergies, and current medications, and teaching patient strategies for performing tasks with less respiratory effort with emphasis on pursed lip breathing.

## 2018-01-15 ENCOUNTER — Telehealth: Payer: Self-pay | Admitting: Cardiology

## 2018-01-15 ENCOUNTER — Encounter (HOSPITAL_COMMUNITY)
Admission: RE | Admit: 2018-01-15 | Discharge: 2018-01-15 | Disposition: A | Discharge status: Home / Self Care | Payer: Medicare Other | Source: Ambulatory Visit | Attending: Pulmonary Disease | Admitting: Pulmonary Disease

## 2018-01-15 DIAGNOSIS — J449 Chronic obstructive pulmonary disease, unspecified: Secondary | ICD-10-CM

## 2018-01-15 DIAGNOSIS — I10 Essential (primary) hypertension: Secondary | ICD-10-CM

## 2018-01-15 MED ORDER — METOPROLOL TARTRATE 50 MG PO TABS: 50.0000 mg | ORAL_TABLET | Freq: Two times a day (BID) | ORAL | 3 refills | Status: AC

## 2018-01-15 MED ORDER — LOSARTAN POTASSIUM 25 MG PO TABS: 25.0000 mg | ORAL_TABLET | Freq: Every day | ORAL | 6 refills | Status: DC

## 2018-01-15 NOTE — Telephone Encounter (Signed)
New Message     Pt c/o BP issue: STAT if pt c/o blurred vision, one-sided weakness or slurred speech  1. What are your last 5 BP readings?  195/72 167/72 175/76  2. Are you having any other symptoms (ex. Dizziness, headache, blurred vision, passed out)?  No   3. What is your BP issue? Patient states her bp is elevated even after she takes her medication , patient was taking mucinex can this be causing the elevation

## 2018-01-15 NOTE — Telephone Encounter (Signed)
Spoke with pt, her bp is running high for several days, no other symptoms. She has been taking mucinex dm but she took the last dose Monday. Confirmed all her other medications. She goes to cardiac rehab this afternoon to do the walk test to start the program. Will need to call her back on her cell phone. Will forward to dr Martinique to review and advise.

## 2018-01-15 NOTE — Telephone Encounter (Signed)
Returned call to patient Dr.[name]'s recommendations given.Advised to have bmet in 2 weeks.Lab order mailed.Advised to call back if B/P continues to be elevated.

## 2018-01-15 NOTE — Telephone Encounter (Signed)
I would recommend she take losartan 25 mg daily. Check BMET in a couple of weeks  Peter Martinique MD, [facility]

## 2018-01-19 DIAGNOSIS — D485 Neoplasm of uncertain behavior of skin: Secondary | ICD-10-CM | POA: Diagnosis not present

## 2018-01-20 ENCOUNTER — Other Ambulatory Visit: Payer: Self-pay

## 2018-01-20 DIAGNOSIS — C44319 Basal cell carcinoma of skin of other parts of face: Secondary | ICD-10-CM | POA: Diagnosis not present

## 2018-01-20 DIAGNOSIS — C4441 Basal cell carcinoma of skin of scalp and neck: Secondary | ICD-10-CM | POA: Diagnosis not present

## 2018-01-21 DIAGNOSIS — I509 Heart failure, unspecified: Secondary | ICD-10-CM | POA: Diagnosis not present

## 2018-01-21 DIAGNOSIS — M859 Disorder of bone density and structure, unspecified: Secondary | ICD-10-CM | POA: Diagnosis not present

## 2018-01-21 DIAGNOSIS — J449 Chronic obstructive pulmonary disease, unspecified: Secondary | ICD-10-CM | POA: Diagnosis not present

## 2018-01-21 DIAGNOSIS — R7301 Impaired fasting glucose: Secondary | ICD-10-CM | POA: Diagnosis not present

## 2018-01-21 DIAGNOSIS — Z1389 Encounter for screening for other disorder: Secondary | ICD-10-CM | POA: Diagnosis not present

## 2018-01-21 DIAGNOSIS — I10 Essential (primary) hypertension: Secondary | ICD-10-CM | POA: Diagnosis not present

## 2018-01-21 DIAGNOSIS — Z6826 Body mass index (BMI) 26.0-26.9, adult: Secondary | ICD-10-CM | POA: Diagnosis not present

## 2018-01-21 DIAGNOSIS — J9610 Chronic respiratory failure, unspecified whether with hypoxia or hypercapnia: Secondary | ICD-10-CM | POA: Diagnosis not present

## 2018-01-21 DIAGNOSIS — I2510 Atherosclerotic heart disease of native coronary artery without angina pectoris: Secondary | ICD-10-CM | POA: Diagnosis not present

## 2018-01-21 DIAGNOSIS — E038 Other specified hypothyroidism: Secondary | ICD-10-CM | POA: Diagnosis not present

## 2018-01-22 ENCOUNTER — Encounter (HOSPITAL_COMMUNITY)
Admission: RE | Admit: 2018-01-22 | Discharge: 2018-01-22 | Disposition: A | Discharge status: Home / Self Care | Payer: Medicare Other | Source: Ambulatory Visit | Attending: Pulmonary Disease | Admitting: Pulmonary Disease

## 2018-01-22 ENCOUNTER — Encounter (HOSPITAL_COMMUNITY): Payer: Medicare Other

## 2018-01-22 DIAGNOSIS — M1990 Unspecified osteoarthritis, unspecified site: Secondary | ICD-10-CM | POA: Diagnosis not present

## 2018-01-22 DIAGNOSIS — E0590 Thyrotoxicosis, unspecified without thyrotoxic crisis or storm: Secondary | ICD-10-CM | POA: Insufficient documentation

## 2018-01-22 DIAGNOSIS — I5032 Chronic diastolic (congestive) heart failure: Secondary | ICD-10-CM | POA: Insufficient documentation

## 2018-01-22 DIAGNOSIS — I739 Peripheral vascular disease, unspecified: Secondary | ICD-10-CM | POA: Insufficient documentation

## 2018-01-22 DIAGNOSIS — I110 Hypertensive heart disease with heart failure: Secondary | ICD-10-CM | POA: Diagnosis not present

## 2018-01-22 DIAGNOSIS — J8410 Pulmonary fibrosis, unspecified: Secondary | ICD-10-CM | POA: Insufficient documentation

## 2018-01-22 DIAGNOSIS — Z9889 Other specified postprocedural states: Secondary | ICD-10-CM | POA: Diagnosis not present

## 2018-01-22 DIAGNOSIS — Z79899 Other long term (current) drug therapy: Secondary | ICD-10-CM | POA: Insufficient documentation

## 2018-01-22 DIAGNOSIS — I440 Atrioventricular block, first degree: Secondary | ICD-10-CM | POA: Insufficient documentation

## 2018-01-22 DIAGNOSIS — R0602 Shortness of breath: Secondary | ICD-10-CM | POA: Diagnosis not present

## 2018-01-22 DIAGNOSIS — E785 Hyperlipidemia, unspecified: Secondary | ICD-10-CM | POA: Diagnosis not present

## 2018-01-22 DIAGNOSIS — Z86010 Personal history of colonic polyps: Secondary | ICD-10-CM | POA: Diagnosis not present

## 2018-01-22 DIAGNOSIS — J449 Chronic obstructive pulmonary disease, unspecified: Secondary | ICD-10-CM | POA: Insufficient documentation

## 2018-01-22 DIAGNOSIS — Z87891 Personal history of nicotine dependence: Secondary | ICD-10-CM | POA: Diagnosis not present

## 2018-01-23 NOTE — Progress Notes (Signed)
Pulmonary Individual Treatment Plan  Patient Details  Name: [name] MRN: [id number] Date of Birth: 04-16-1929 Referring Provider:     Pulmonary Rehab Walk Test from 01/22/2018 in [place]  Referring Provider  Dr. Elsworth Soho      Initial Encounter Date:    Pulmonary Rehab Walk Test from 01/22/2018 in [place]  Date  01/22/18  Referring Provider  Dr. Elsworth Soho      Visit Diagnosis: Chronic obstructive pulmonary disease, unspecified COPD type ([place])  Patient's Home Medications on Admission:   Current Outpatient Medications:  .  acetaminophen (TYLENOL) 500 MG tablet, Take 1,000 mg by mouth every 6 (six) hours as needed., Disp: , Rfl:  .  albuterol (PROVENTIL HFA;VENTOLIN HFA) 108 (90 Base) MCG/ACT inhaler, Inhale 2 puffs into the lungs every 6 (six) hours as needed for wheezing or shortness of breath., Disp: 1 Inhaler, Rfl: 1 .  Aloe-Sodium Chloride (AYR SALINE NASAL GEL NA), Place 1 application into the nose daily as needed (dryness)., Disp: , Rfl:  .  ANORO ELLIPTA 62.5-25 MCG/INH AEPB, INHALE 1 PUFF INTO THE LUNGS DAILY, Disp: 60 each, Rfl: 3 .  aspirin 81 MG tablet, Take 81 mg by mouth daily.  , Disp: , Rfl:  .  Carboxymethylcellul-Glycerin (LUBRICATING EYE DROPS OP), Apply 1 drop to eye daily as needed (dry eyes)., Disp: , Rfl:  .  Cholecalciferol (VITAMIN D3) 2000 units capsule, Take 2,000 Units by mouth daily., Disp: , Rfl:  .  Coenzyme Q10 (CO Q 10) 100 MG CAPS, Take 100 mg by mouth daily., Disp: , Rfl:  .  Cyanocobalamin (B-12) 500 MCG TABS, Take 500 mcg by mouth daily., Disp: , Rfl:  .  denosumab (PROLIA) 60 MG/ML SOLN injection, Inject 60 mg into the skin every 6 (six) months. Administer in upper arm, thigh, or abdomen, Disp: , Rfl:  .  folic acid (FOLVITE) 419 MCG tablet, Take 400 mcg by mouth every evening. , Disp: , Rfl:  .  furosemide (LASIX) 20 MG tablet, Take 1 tablet (20 mg total) by mouth daily., Disp: 90  tablet, Rfl: 3 .  isosorbide mononitrate (IMDUR) 30 MG 24 hr tablet, TAKE 1 TABLET(30 MG) BY MOUTH DAILY, Disp: 90 tablet, Rfl: 2 .  levothyroxine (SYNTHROID, LEVOTHROID) 137 MCG tablet, Take 137 mcg by mouth daily.  , Disp: , Rfl:  .  losartan (COZAAR) 25 MG tablet, Take 1 tablet (25 mg total) by mouth daily., Disp: 30 tablet, Rfl: 6 .  metoprolol tartrate (LOPRESSOR) 50 MG tablet, Take 1 tablet (50 mg total) by mouth 2 (two) times daily., Disp: 180 tablet, Rfl: 3 .  Multiple Vitamin (MULTIVITAMIN WITH MINERALS) TABS tablet, Take 1 tablet by mouth daily., Disp: , Rfl:  .  multivitamin-lutein (OCUVITE-LUTEIN) CAPS capsule, Take 1 capsule by mouth daily., Disp: , Rfl:  .  nitroGLYCERIN (NITROSTAT) 0.4 MG SL tablet, Place 0.4 mg under the tongue every 5 (five) minutes as needed for chest pain (x 3 pills)., Disp: , Rfl:  .  nystatin cream (MYCOSTATIN), Apply 1 application topically 2 (two) times daily., Disp: 30 g, Rfl: 1 .  Omega-3 Fatty Acids (FISH OIL) 1200 MG CAPS, Take 1,200 mg by mouth 2 (two) times daily., Disp: , Rfl:  .  rosuvastatin (CRESTOR) 40 MG tablet, Take 20 mg by mouth daily. , Disp: , Rfl:   Past Medical History: Past Medical History:  Diagnosis Date  . Arthritis   . B12 deficiency   .  Basal cell cancer    Nose  . Carotid artery occlusion   . Cataract   . Chronic diastolic CHF (congestive heart failure) ([place])   . Colon polyps   . COPD (chronic obstructive pulmonary disease) ([place])    STAGE 2  . Coronary artery disease    status post angioplasty of the mid RCA in 1995  . Dyspnea   . First degree atrioventricular block   . History of obesity   . Hyperlipidemia   . Hypertension    well controlled  . Hyperthyroidism   . Lichen sclerosus et atrophicus of the vulva   . Osteopenia   . PAD (peripheral artery disease) ([facility])    with stable claudication  . Pulmonary fibrosis ([facility])    wears 4L home O2 at baseline    Tobacco Use: Social History   Tobacco Use  Smoking  Status Former Smoker  . Packs/day: 1.00  . Years: 40.00  . Pack years: 40.00  . Types: Cigarettes  . Last attempt to quit: 11/18/1993  . Years since quitting: 24.1  Smokeless Tobacco Never Used    Labs: Recent Chemical engineer    Labs for ITP Cardiac and Pulmonary Rehab Latest Ref Rng & Units 05/27/2008 12/28/2016   Hemoglobin A1c 4.8 - 5.6 % - 5.0   TCO2 - 23 -      Capillary Blood Glucose: Lab Results  Component Value Date   GLUCAP 96 12/30/2016   GLUCAP 97 12/30/2016   GLUCAP 132 (H) 12/29/2016   GLUCAP 140 (H) 12/29/2016   GLUCAP 123 (H) 12/29/2016     Pulmonary Assessment Scores: Pulmonary Assessment Scores    Row Name 01/15/18 1746 01/23/18 0704       ADL UCSD   ADL Phase  Entry  Entry    SOB Score total  49  -      CAT Score   CAT Score  20 Entry  -      mMRC Score   mMRC Score  -  0       Pulmonary Function Assessment: Pulmonary Function Assessment - 01/12/18 1453      Breath   Bilateral Breath Sounds  Clear    Shortness of Breath  Limiting activity;Yes       Exercise Target Goals: Date: 01/22/18  Exercise Program Goal: Individual exercise prescription set using results from initial 6 min walk test and THRR while considering  patient's activity barriers and safety.    Exercise Prescription Goal: Initial exercise prescription builds to 30-45 minutes a day of aerobic activity, 2-3 days per week.  Home exercise guidelines will be given to patient during program as part of exercise prescription that the participant will acknowledge.  Activity Barriers & Risk Stratification: Activity Barriers & Cardiac Risk Stratification - 01/12/18 1436      Activity Barriers & Cardiac Risk Stratification   Activity Barriers  Shortness of Breath;Deconditioning       6 Minute Walk: 6 Minute Walk    Row Name 01/23/18 0700         6 Minute Walk   Phase  Initial     Distance  800 feet     Walk Time  - 5 minutes 10 seconds     # of Rest Breaks  1  stopped by ep due to low sat     MPH  1.51     METS  2.15     RPE  13     Perceived Dyspnea  2     Symptoms  Yes (comment)     Comments  used wheelchair and forehead probe-very slow o2 recovery     Resting HR  68 bpm     Resting BP  162/78     Resting Oxygen Saturation   91 %     Exercise Oxygen Saturation  during 6 min walk  78 %     Max Ex. HR  137 bpm     Max Ex. BP  180/64       Interval HR   1 Minute HR  100     2 Minute HR  118     3 Minute HR  130     4 Minute HR  132     5 Minute HR  132     6 Minute HR  137     2 Minute Post HR  110     Interval Heart Rate?  Yes       Interval Oxygen   Interval Oxygen?  Yes     Baseline Oxygen Saturation %  91 %     1 Minute Oxygen Saturation %  95 %     1 Minute Liters of Oxygen  10 L     2 Minute Oxygen Saturation %  84 %     2 Minute Liters of Oxygen  10 L     3 Minute Oxygen Saturation %  81 %     3 Minute Liters of Oxygen  15 L     4 Minute Oxygen Saturation %  85 % rest break by EP     4 Minute Liters of Oxygen  15 L     5 Minute Oxygen Saturation %  85 %     5 Minute Liters of Oxygen  15 L     6 Minute Oxygen Saturation %  78 %     6 Minute Liters of Oxygen  15 L     2 Minute Post Oxygen Saturation %  82 % did not recover to 91% until 4 minutes post 62mwt     2 Minute Post Liters of Oxygen  10 L        Oxygen Initial Assessment: Oxygen Initial Assessment - 01/23/18 0703      Initial 6 min Walk   Oxygen Used  Continuous;E-Tanks    Liters per minute  15      Program Oxygen Prescription   Program Oxygen Prescription  Continuous;E-Tanks    Liters per minute  -- 10-15    Comments  Will get guidlines from Dr. Elsworth Soho for O2 Sat and liter flow       Oxygen Re-Evaluation:   Oxygen Discharge (Final Oxygen Re-Evaluation):   Initial Exercise Prescription: Initial Exercise Prescription - 01/23/18 0700      Date of Initial Exercise RX and Referring Provider   Date  01/22/18    Referring Provider  Dr. Elsworth Soho       Oxygen   Oxygen  Continuous    Liters  -- 10-15      NuStep   Level  2    SPM  80    Minutes  17    METs  1.5      Track   Laps  5    Minutes  17      Prescription Details   Frequency (times per week)  2    Duration  Progress to 45 minutes of aerobic exercise without signs/symptoms of  physical distress      Intensity   THRR 40-80% of Max Heartrate  54-107    Ratings of Perceived Exertion  11-13    Perceived Dyspnea  0-4      Progression   Progression  Continue progressive overload as per policy without signs/symptoms or physical distress.      Resistance Training   Training Prescription  Yes    Weight  orange bands    Reps  10-15       Perform Capillary Blood Glucose checks as needed.  Exercise Prescription Changes:   Exercise Comments:   Exercise Goals and Review: Exercise Goals    Row Name 01/12/18 1437             Exercise Goals   Increase Physical Activity  Yes       Intervention  Provide advice, education, support and counseling about physical activity/exercise needs.;Develop an individualized exercise prescription for aerobic and resistive training based on initial evaluation findings, risk stratification, comorbidities and participant's personal goals.       Expected Outcomes  Short Term: Attend rehab on a regular basis to increase amount of physical activity.;Long Term: Add in home exercise to make exercise part of routine and to increase amount of physical activity.;Long Term: Exercising regularly at least 3-5 days a week.       Increase Strength and Stamina  Yes       Intervention  Provide advice, education, support and counseling about physical activity/exercise needs.;Develop an individualized exercise prescription for aerobic and resistive training based on initial evaluation findings, risk stratification, comorbidities and participant's personal goals.       Expected Outcomes  Short Term: Increase workloads from initial exercise prescription for  resistance, speed, and METs.;Short Term: Perform resistance training exercises routinely during rehab and add in resistance training at home;Long Term: Improve cardiorespiratory fitness, muscular endurance and strength as measured by increased METs and functional capacity (6MWT)       Able to understand and use rate of perceived exertion (RPE) scale  Yes       Intervention  Provide education and explanation on how to use RPE scale       Expected Outcomes  Short Term: Able to use RPE daily in rehab to express subjective intensity level;Long Term:  Able to use RPE to guide intensity level when exercising independently       Able to understand and use Dyspnea scale  Yes       Intervention  Provide education and explanation on how to use Dyspnea scale       Expected Outcomes  Short Term: Able to use Dyspnea scale daily in rehab to express subjective sense of shortness of breath during exertion;Long Term: Able to use Dyspnea scale to guide intensity level when exercising independently       Knowledge and understanding of Target Heart Rate Range (THRR)  Yes       Intervention  Provide education and explanation of THRR including how the numbers were predicted and where they are located for reference       Expected Outcomes  Short Term: Able to state/look up THRR;Short Term: Able to use daily as guideline for intensity in rehab;Long Term: Able to use THRR to govern intensity when exercising independently       Understanding of Exercise Prescription  Yes       Intervention  Provide education, explanation, and written materials on patient's individual exercise prescription       Expected  Outcomes  Short Term: Able to explain program exercise prescription;Long Term: Able to explain home exercise prescription to exercise independently          Exercise Goals Re-Evaluation :   Discharge Exercise Prescription (Final Exercise Prescription Changes):   Nutrition:  Target Goals: Understanding of nutrition  guidelines, daily intake of sodium 1500mg , cholesterol 200mg , calories 30% from fat and 7% or less from saturated fats, daily to have 5 or more servings of fruits and vegetables.  Biometrics: Pre Biometrics - 01/12/18 1459      Pre Biometrics   Grip Strength  30 kg        Nutrition Therapy Plan and Nutrition Goals:   Nutrition Assessments:   Nutrition Goals Re-Evaluation:   Nutrition Goals Discharge (Final Nutrition Goals Re-Evaluation):   Psychosocial: Target Goals: Acknowledge presence or absence of significant depression and/or stress, maximize coping skills, provide positive support system. Participant is able to verbalize types and ability to use techniques and skills needed for reducing stress and depression.  Initial Review & Psychosocial Screening: Initial Psych Review & Screening - 01/12/18 1456      Initial Review   Current issues with  None Identified      Family Dynamics   Good Support System?  Yes      Barriers   Psychosocial barriers to participate in program  There are no identifiable barriers or psychosocial needs.      Screening Interventions   Interventions  Encouraged to exercise       Quality of Life Scores:  Scores of 19 and below usually indicate a poorer quality of life in these areas.  A difference of  2-3 points is a clinically meaningful difference.  A difference of 2-3 points in the total score of the Quality of Life Index has been associated with significant improvement in overall quality of life, self-image, physical symptoms, and general health in studies assessing change in quality of life.   PHQ-9: Recent Review Flowsheet Data    Depression screen [facility] 2/9 01/12/2018   Decreased Interest 0   Down, Depressed, Hopeless 0   PHQ - 2 Score 0     Interpretation of Total Score  Total Score Depression Severity:  1-4 = Minimal depression, 5-9 = Mild depression, 10-14 = Moderate depression, 15-19 = Moderately severe depression, 20-27 =  Severe depression   Psychosocial Evaluation and Intervention: Psychosocial Evaluation - 01/12/18 1457      Psychosocial Evaluation & Interventions   Interventions  Encouraged to exercise with the program and follow exercise prescription    Expected Outcomes  patient will remain free from psychosocial barriers to participation in pulmonary rehab    Continue Psychosocial Services   No Follow up required       Psychosocial Re-Evaluation:   Psychosocial Discharge (Final Psychosocial Re-Evaluation):   Education: Education Goals: Education classes will be provided on a weekly basis, covering required topics. Participant will state understanding/return demonstration of topics presented.  Learning Barriers/Preferences: Learning Barriers/Preferences - 01/12/18 1453      Learning Barriers/Preferences   Learning Barriers  None    Learning Preferences  Written Material;Group Instruction;Verbal Instruction;Skilled Demonstration       Education Topics: Risk Factor Reduction:  -Group instruction that is supported by a PowerPoint presentation. Instructor discusses the definition of a risk factor, different risk factors for pulmonary disease, and how the heart and lungs work together.     Nutrition for Pulmonary Patient:  -Group instruction provided by PowerPoint slides, verbal discussion, and written  materials to support subject matter. The instructor gives an explanation and review of healthy diet recommendations, which includes a discussion on weight management, recommendations for fruit and vegetable consumption, as well as protein, fluid, caffeine, fiber, sodium, sugar, and alcohol. Tips for eating when patients are short of breath are discussed.   Pursed Lip Breathing:  -Group instruction that is supported by demonstration and informational handouts. Instructor discusses the benefits of pursed lip and diaphragmatic breathing and detailed demonstration on how to preform both.      Oxygen Safety:  -Group instruction provided by PowerPoint, verbal discussion, and written material to support subject matter. There is an overview of "What is Oxygen" and "Why do we need it".  Instructor also reviews how to create a safe environment for oxygen use, the importance of using oxygen as prescribed, and the risks of noncompliance. There is a brief discussion on traveling with oxygen and resources the patient may utilize.   Oxygen Equipment:  -Group instruction provided by [facility] Staff utilizing handouts, written materials, and equipment demonstrations.   Signs and Symptoms:  -Group instruction provided by written material and verbal discussion to support subject matter. Warning signs and symptoms of infection, stroke, and heart attack are reviewed and when to call the physician/911 reinforced. Tips for preventing the spread of infection discussed.   Advanced Directives:  -Group instruction provided by verbal instruction and written material to support subject matter. Instructor reviews Advanced Directive laws and proper instruction for filling out document.   Pulmonary Video:  -Group video education that reviews the importance of medication and oxygen compliance, exercise, good nutrition, pulmonary hygiene, and pursed lip and diaphragmatic breathing for the pulmonary patient.   Exercise for the Pulmonary Patient:  -Group instruction that is supported by a PowerPoint presentation. Instructor discusses benefits of exercise, core components of exercise, frequency, duration, and intensity of an exercise routine, importance of utilizing pulse oximetry during exercise, safety while exercising, and options of places to exercise outside of rehab.     Pulmonary Medications:  -Verbally interactive group education provided by instructor with focus on inhaled medications and proper administration.   Anatomy and Physiology of the Respiratory System and Intimacy:  -Group  instruction provided by PowerPoint, verbal discussion, and written material to support subject matter. Instructor reviews respiratory cycle and anatomical components of the respiratory system and their functions. Instructor also reviews differences in obstructive and restrictive respiratory diseases with examples of each. Intimacy, Sex, and Sexuality differences are reviewed with a discussion on how relationships can change when diagnosed with pulmonary disease. Common sexual concerns are reviewed.   MD DAY -A group question and answer session with a medical doctor that allows participants to ask questions that relate to their pulmonary disease state.   OTHER EDUCATION -Group or individual verbal, written, or video instructions that support the educational goals of the pulmonary rehab program.   Holiday Eating Survival Tips:  -Group instruction provided by PowerPoint slides, verbal discussion, and written materials to support subject matter. The instructor gives patients tips, tricks, and techniques to help them not only survive but enjoy the holidays despite the onslaught of food that accompanies the holidays.   Knowledge Questionnaire Score: Knowledge Questionnaire Score - 01/15/18 1746      Knowledge Questionnaire Score   Pre Score  13/18       Core Components/Risk Factors/Patient Goals at Admission: Personal Goals and Risk Factors at Admission - 01/12/18 1456      Core Components/Risk Factors/Patient Goals on Admission  Improve shortness of breath with ADL's  Yes    Intervention  Provide education, individualized exercise plan and daily activity instruction to help decrease symptoms of SOB with activities of daily living.    Expected Outcomes  Short Term: Improve cardiorespiratory fitness to achieve a reduction of symptoms when performing ADLs;Long Term: Be able to perform more ADLs without symptoms or delay the onset of symptoms       Core Components/Risk Factors/Patient Goals  Review:    Core Components/Risk Factors/Patient Goals at Discharge (Final Review):    ITP Comments:   Comments:

## 2018-01-26 DIAGNOSIS — Z961 Presence of intraocular lens: Secondary | ICD-10-CM | POA: Diagnosis not present

## 2018-01-26 DIAGNOSIS — H353132 Nonexudative age-related macular degeneration, bilateral, intermediate dry stage: Secondary | ICD-10-CM | POA: Diagnosis not present

## 2018-01-26 DIAGNOSIS — H43813 Vitreous degeneration, bilateral: Secondary | ICD-10-CM | POA: Diagnosis not present

## 2018-01-26 DIAGNOSIS — H04123 Dry eye syndrome of bilateral lacrimal glands: Secondary | ICD-10-CM | POA: Diagnosis not present

## 2018-01-27 ENCOUNTER — Emergency Department (HOSPITAL_COMMUNITY)
Admission: EM | Admit: 2018-01-27 | Discharge: 2018-01-27 | Disposition: A | Discharge status: Home / Self Care | Payer: Medicare Other | Attending: Emergency Medicine | Admitting: Emergency Medicine

## 2018-01-27 ENCOUNTER — Emergency Department (HOSPITAL_COMMUNITY): Payer: Medicare Other

## 2018-01-27 ENCOUNTER — Encounter (HOSPITAL_COMMUNITY)
Admission: RE | Admit: 2018-01-27 | Discharge: 2018-01-27 | Disposition: A | Discharge status: Home / Self Care | Payer: Medicare Other | Source: Ambulatory Visit

## 2018-01-27 ENCOUNTER — Other Ambulatory Visit: Payer: Self-pay

## 2018-01-27 DIAGNOSIS — Z87891 Personal history of nicotine dependence: Secondary | ICD-10-CM | POA: Diagnosis not present

## 2018-01-27 DIAGNOSIS — I5032 Chronic diastolic (congestive) heart failure: Secondary | ICD-10-CM | POA: Diagnosis not present

## 2018-01-27 DIAGNOSIS — Z23 Encounter for immunization: Secondary | ICD-10-CM | POA: Diagnosis not present

## 2018-01-27 DIAGNOSIS — Y939 Activity, unspecified: Secondary | ICD-10-CM | POA: Diagnosis not present

## 2018-01-27 DIAGNOSIS — S0101XA Laceration without foreign body of scalp, initial encounter: Secondary | ICD-10-CM

## 2018-01-27 DIAGNOSIS — R55 Syncope and collapse: Secondary | ICD-10-CM

## 2018-01-27 DIAGNOSIS — Z79899 Other long term (current) drug therapy: Secondary | ICD-10-CM | POA: Insufficient documentation

## 2018-01-27 DIAGNOSIS — I110 Hypertensive heart disease with heart failure: Secondary | ICD-10-CM | POA: Diagnosis not present

## 2018-01-27 DIAGNOSIS — I10 Essential (primary) hypertension: Secondary | ICD-10-CM | POA: Diagnosis not present

## 2018-01-27 DIAGNOSIS — Y929 Unspecified place or not applicable: Secondary | ICD-10-CM | POA: Diagnosis not present

## 2018-01-27 DIAGNOSIS — J449 Chronic obstructive pulmonary disease, unspecified: Secondary | ICD-10-CM | POA: Diagnosis not present

## 2018-01-27 DIAGNOSIS — Y999 Unspecified external cause status: Secondary | ICD-10-CM | POA: Insufficient documentation

## 2018-01-27 DIAGNOSIS — S0990XA Unspecified injury of head, initial encounter: Secondary | ICD-10-CM | POA: Diagnosis present

## 2018-01-27 DIAGNOSIS — R0902 Hypoxemia: Secondary | ICD-10-CM

## 2018-01-27 DIAGNOSIS — W19XXXA Unspecified fall, initial encounter: Secondary | ICD-10-CM | POA: Insufficient documentation

## 2018-01-27 LAB — CBC WITH DIFFERENTIAL/PLATELET
BASOS ABS: 0 10*3/uL (ref 0.0–0.1)
BASOS PCT: 0 %
EOS ABS: 0 10*3/uL (ref 0.0–0.7)
Eosinophils Relative: 0 %
HCT: 42.6 % (ref 36.0–46.0)
HEMOGLOBIN: 13.7 g/dL (ref 12.0–15.0)
Lymphocytes Relative: 18 %
Lymphs Abs: 1.4 10*3/uL (ref 0.7–4.0)
MCH: 26.3 pg (ref 26.0–34.0)
MCHC: 32.2 g/dL (ref 30.0–36.0)
MCV: 81.9 fL (ref 78.0–100.0)
Monocytes Absolute: 0.3 10*3/uL (ref 0.1–1.0)
Monocytes Relative: 3 %
NEUTROS PCT: 79 %
Neutro Abs: 6.5 10*3/uL (ref 1.7–7.7)
Platelets: 169 10*3/uL (ref 150–400)
RBC: 5.2 MIL/uL — AB (ref 3.87–5.11)
RDW: 15.4 % (ref 11.5–15.5)
WBC: 8.2 10*3/uL (ref 4.0–10.5)

## 2018-01-27 LAB — URINALYSIS, ROUTINE W REFLEX MICROSCOPIC
BILIRUBIN URINE: NEGATIVE
Bacteria, UA: NONE SEEN
Glucose, UA: NEGATIVE mg/dL
HGB URINE DIPSTICK: NEGATIVE
KETONES UR: NEGATIVE mg/dL
NITRITE: NEGATIVE
PH: 5 (ref 5.0–8.0)
Protein, ur: NEGATIVE mg/dL
SPECIFIC GRAVITY, URINE: 1.006 (ref 1.005–1.030)

## 2018-01-27 LAB — COMPREHENSIVE METABOLIC PANEL
ALBUMIN: 3.5 g/dL (ref 3.5–5.0)
ALT: 26 U/L (ref 14–54)
AST: 36 U/L (ref 15–41)
Alkaline Phosphatase: 53 U/L (ref 38–126)
Anion gap: 9 (ref 5–15)
BUN: 19 mg/dL (ref 6–20)
CHLORIDE: 106 mmol/L (ref 101–111)
CO2: 26 mmol/L (ref 22–32)
CREATININE: 0.94 mg/dL (ref 0.44–1.00)
Calcium: 9.3 mg/dL (ref 8.9–10.3)
GFR calc non Af Amer: 53 mL/min — ABNORMAL LOW (ref >60)
GLUCOSE: 95 mg/dL (ref 65–99)
Potassium: 3.8 mmol/L (ref 3.5–5.1)
SODIUM: 141 mmol/L (ref 135–145)
Total Bilirubin: 1.2 mg/dL (ref 0.3–1.2)
Total Protein: 7 g/dL (ref 6.5–8.1)

## 2018-01-27 LAB — BRAIN NATRIURETIC PEPTIDE: B Natriuretic Peptide: 305 pg/mL — ABNORMAL HIGH (ref 0.0–100.0)

## 2018-01-27 LAB — I-STAT TROPONIN, ED
TROPONIN I, POC: 0.01 ng/mL (ref 0.00–0.08)
Troponin i, poc: 0 ng/mL (ref 0.00–0.08)

## 2018-01-27 LAB — MAGNESIUM: Magnesium: 1.7 mg/dL (ref 1.7–2.4)

## 2018-01-27 MED ORDER — TETANUS-DIPHTH-ACELL PERTUSSIS 5-2.5-18.5 LF-MCG/0.5 IM SUSP
0.5000 mL | Freq: Once | INTRAMUSCULAR | Status: AC
  Administered 2018-01-27: 0.5 mL via INTRAMUSCULAR
  Filled 2018-01-27: qty 0.5

## 2018-01-27 NOTE — Discharge Instructions (Signed)
Your workup today did not show evidence of acute infection or other significant electrolyte of normality concerned your symptoms.  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  you continue to have low oxygen saturations when you ambulated however, given your overall well appearance, we decided together that you could go home.  We discussed you understanding the risks of worsening breathing or other complications of low oxygen and he will call your pulmonologist and PCP tomorrow.  Your head injury was cleaned and treated with a staple which will need to come out with your primary doctor in 7-10 days.  If you have any new neurologic changes headaches or vision changes, please return to the nearest emergency department.

## 2018-01-27 NOTE — ED Notes (Signed)
Patient brought from main hospital, working as a Psychologist, occupational, patient had a syncopal episode and was brought here by [employer]. Patient alert and oriented x4.

## 2018-01-27 NOTE — ED Provider Notes (Signed)
[facility] EMERGENCY DEPARTMENT Provider Note   CSN: [id number] Arrival date & time: 01/27/18  1445     History   Chief Complaint Chief Complaint  Patient presents with  . Fall    HPI PACHIA STRUM is a 84 y.o. female.    The history is provided by the patient and medical records. No language interpreter was used.  Loss of Consciousness   This is a new problem. The current episode started less than 1 hour ago. The problem occurs constantly. The problem has been resolved. She lost consciousness for a period of less than one minute (unnown time but likely less than 1 min). The problem is associated with normal activity (walking down hall). Associated symptoms include light-headedness. Pertinent negatives include abdominal pain, back pain, bladder incontinence, chest pain, confusion, congestion, diaphoresis, dizziness, fever, focal weakness, headaches, malaise/fatigue, nausea, palpitations, seizures, vertigo, visual change, vomiting and weakness. She has tried nothing for the symptoms. The treatment provided no relief. Her past medical history is significant for CAD and HTN.    Past Medical History:  Diagnosis Date  . Arthritis   . B12 deficiency   . Basal cell cancer    Nose  . Carotid artery occlusion   . Cataract   . Chronic diastolic CHF (congestive heart failure) ([place])   . Colon polyps   . COPD (chronic obstructive pulmonary disease) ([place])    STAGE 2  . Coronary artery disease    status post angioplasty of the mid RCA in 1995  . Dyspnea   . First degree atrioventricular block   . History of obesity   . Hyperlipidemia   . Hypertension    well controlled  . Hyperthyroidism   . Lichen sclerosus et atrophicus of the vulva   . Osteopenia   . PAD (peripheral artery disease) ([facility])    with stable claudication  . Pulmonary fibrosis ([place])    wears 4L home O2 at baseline    Patient Active Problem List   Diagnosis Date Noted  . Tachycardia 12/27/2016   . Influenza with pneumonia 12/26/2016  . Chronic respiratory failure with hypoxia ([place]) 07/10/2016  . Polycythemia, secondary 07/05/2016  . Pulmonary hypertension ([place]) 02/06/2016  . COPD GOLD II  02/05/2016  . Chronic diastolic CHF (congestive heart failure) ([place]) 11/01/2014  . CHF (congestive heart failure), NYHA class II ([place]) 10/18/2014  . Carotid stenosis 09/21/2014  . Osteopenia 10/27/2013  . Aftercare following surgery of the circulatory system, [place] 09/15/2013  . 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 Vaginal atrophy 05/20/2013  . Peripheral vascular disease ([place]) 02/10/2013  . Occlusion and stenosis of carotid artery without mention of cerebral infarction 09/16/2012  . Pain in limb 09/16/2012  . Hypertension   . Hyperlipidemia   . PAD (peripheral artery disease) ([place])   . Coronary artery disease     Past Surgical History:  Procedure Laterality Date  . CAROTID ENDARTERECTOMY Right 2002  . CATARACT EXTRACTION W/ INTRAOCULAR LENS  IMPLANT, BILATERAL    . CHOLECYSTECTOMY  1999  . ENDARTERECTOMY     right carotid endarterectomy  . KNEE ARTHROSCOPY Left   . MENISCUS REPAIR Left 10/29/2012  . OPEN REDUCTION INTERNAL FIXATION (ORIF) DISTAL RADIAL FRACTURE Left 11/08/2016   Procedure: OPEN TREATMENT OF LEFT DISTAL RADIUS FRACTURE;  Surgeon: Milly Jakob, MD;  Location: [facility];  Service: Orthopedics;  Laterality: Left;  . REPAIR TENDONS FOOT      OB History    Gravida Para Term Preterm AB Living  3 3       3    SAB TAB Ectopic Multiple Live Births                   Home Medications    Prior to Admission medications   Medication Sig Start Date End Date Taking? Authorizing Provider  acetaminophen (TYLENOL) 500 MG tablet Take 1,000 mg by mouth every 6 (six) hours as needed.    [provider]  albuterol (PROVENTIL HFA;VENTOLIN HFA) 108 (90 Base) MCG/ACT inhaler Inhale 2 puffs into the lungs every 6 (six) hours as needed for wheezing or shortness of breath.  08/08/17   Parrett, Tammy S, NP  Aloe-Sodium Chloride (AYR SALINE NASAL GEL NA) Place 1 application into the nose daily as needed (dryness).    [provider]  Celedonio Miyamoto 62.5-25 MCG/INH AEPB INHALE 1 PUFF INTO THE LUNGS DAILY 12/15/17   Parrett, Fonnie Mu, NP  aspirin 81 MG tablet Take 81 mg by mouth daily.      [provider]  Carboxymethylcellul-Glycerin (LUBRICATING EYE DROPS OP) Apply 1 drop to eye daily as needed (dry eyes).    [provider]  Cholecalciferol (VITAMIN D3) 2000 units capsule Take 2,000 Units by mouth daily.    [provider]  Coenzyme Q10 (CO Q 10) 100 MG CAPS Take 100 mg by mouth daily.    [provider]  Cyanocobalamin (B-12) 500 MCG TABS Take 500 mcg by mouth daily.    [provider]  denosumab (PROLIA) 60 MG/ML SOLN injection Inject 60 mg into the skin every 6 (six) months. Administer in upper arm, thigh, or abdomen    [provider]  folic acid (FOLVITE) 623 MCG tablet Take 400 mcg by mouth every evening.     [provider]  furosemide (LASIX) 20 MG tablet Take 1 tablet (20 mg total) by mouth daily. 01/30/17   Martinique, Peter M, MD  isosorbide mononitrate (IMDUR) 30 MG 24 hr tablet TAKE 1 TABLET(30 MG) BY MOUTH DAILY 11/21/17   [place], Peter M, MD  levothyroxine (SYNTHROID, LEVOTHROID) 137 MCG tablet Take 137 mcg by mouth daily.      [provider]  losartan (COZAAR) 25 MG tablet Take 1 tablet (25 mg total) by mouth daily. 01/15/18 04/15/18  Martinique, Peter M, MD  metoprolol tartrate (LOPRESSOR) 50 MG tablet Take 1 tablet (50 mg total) by mouth 2 (two) times daily. 01/15/18   [place], Peter M, MD  Multiple Vitamin (MULTIVITAMIN WITH MINERALS) TABS tablet Take 1 tablet by mouth daily.    [provider]  multivitamin-lutein (OCUVITE-LUTEIN) CAPS capsule Take 1 capsule by mouth daily.    [provider]  nitroGLYCERIN (NITROSTAT) 0.4 MG SL tablet Place 0.4 mg under the tongue  every 5 (five) minutes as needed for chest pain (x 3 pills).    [provider]  nystatin cream (MYCOSTATIN) Apply 1 application topically 2 (two) times daily. 08/13/17   Fontaine, Belinda Block, MD  Omega-3 Fatty Acids (FISH OIL) 1200 MG CAPS Take 1,200 mg by mouth 2 (two) times daily.    [provider]  rosuvastatin (CRESTOR) 40 MG tablet Take 20 mg by mouth daily.     [provider]    Family History Family History  Problem Relation Age of Onset  . Heart attack Father 9  . Hypertension Father   . Heart disease Brother        underwent coronary bypass grafting at age 68  . Hypertension Brother   .  Diabetes Sister   . Breast cancer Maternal Aunt     Social History Social History   Tobacco Use  . Smoking status: Former Smoker    Packs/day: 1.00    Years: 40.00    Pack years: 40.00    Types: Cigarettes    Last attempt to quit: 11/18/1993    Years since quitting: 24.2  . Smokeless tobacco: Never Used  Substance Use Topics  . Alcohol use: No    Alcohol/week: 0.0 oz  . Drug use: No     Allergies   Patient has no known allergies.   Review of Systems Review of Systems  Constitutional: Negative for chills, diaphoresis, fatigue, fever and malaise/fatigue.  HENT: Negative for congestion.   Respiratory: Negative for cough, chest tightness, shortness of breath, wheezing and stridor.   Cardiovascular: Positive for syncope. Negative for chest pain and palpitations.  Gastrointestinal: Negative for abdominal pain, constipation, diarrhea, nausea and vomiting.  Genitourinary: Negative for bladder incontinence and dysuria.  Musculoskeletal: Negative for back pain, neck pain and neck stiffness.  Skin: Positive for wound. Negative for rash.  Neurological: Positive for syncope and light-headedness. Negative for dizziness, vertigo, focal weakness, seizures, weakness, numbness and headaches.  Psychiatric/Behavioral: Negative for agitation and confusion.  All other  systems reviewed and are negative.    Physical Exam Updated Vital Signs BP (!) 137/58 (BP Location: Right Arm)   Pulse 63   Temp 98.7 F (37.1 C) (Oral)   Resp 15   Ht 5\' 3"  (1.6 m)   Wt 70.8 kg (156 lb)   SpO2 92%   BMI 27.63 kg/m   Physical Exam  Constitutional: She is oriented to person, place, and time. She appears well-developed and well-nourished. No distress.  HENT:  Head: Normocephalic and atraumatic.  Mouth/Throat: Oropharynx is clear and moist. No oropharyngeal exudate.  Eyes: Conjunctivae and EOM are normal. Pupils are equal, round, and reactive to light.  Neck: Normal range of motion.  Cardiovascular: Normal rate and intact distal pulses.  No murmur heard. Pulmonary/Chest: Effort normal. No respiratory distress. She has no wheezes. She has rales. She exhibits no tenderness.  Abdominal: Soft. She exhibits no distension. There is no tenderness.  Musculoskeletal: Normal range of motion. She exhibits no edema or tenderness.  Neurological: She is alert and oriented to person, place, and time. No sensory deficit. She exhibits normal muscle tone.  Skin: Skin is warm. Capillary refill takes less than 2 seconds. No rash noted. She is not diaphoretic. No erythema.  Psychiatric: She has a normal mood and affect.  Nursing note and vitals reviewed.    ED Treatments / Results  Labs (all labs ordered are listed, but only abnormal results are displayed) Labs Reviewed  CBC WITH DIFFERENTIAL/PLATELET - Abnormal; Notable for the following components:      Result Value   RBC 5.20 (*)    All other components within normal limits  COMPREHENSIVE METABOLIC PANEL - Abnormal; Notable for the following components:   GFR calc non Af Amer 53 (*)    All other components within normal limits  BRAIN NATRIURETIC PEPTIDE - Abnormal; Notable for the following components:   B Natriuretic Peptide 305.0 (*)    All other components within normal limits  URINALYSIS, ROUTINE W REFLEX MICROSCOPIC  - Abnormal; Notable for the following components:   Leukocytes, UA TRACE (*)    Squamous Epithelial / LPF 0-5 (*)    All other components within normal limits  URINE CULTURE  MAGNESIUM  I-STAT TROPONIN, ED  I-STAT TROPONIN, ED    EKG  EKG Interpretation  Date/Time:  Tuesday January 27 2018 15:17:35 EDT Ventricular Rate:  65 PR Interval:    QRS Duration: 100 QT Interval:  443 QTC Calculation: 461 R Axis:   63 Text Interpretation:  Sinus rhythm Prolonged PR interval Low voltage with right axis deviation When compared to prior, no significant changes seen,.  No STEMI Confirmed by Antony Blackbird [phone]) on 01/28/2018 2:24:14 AM       Radiology Dg Chest 2 View  Result Date: 01/27/2018 CLINICAL DATA:  84 year old female status post syncope today. EXAM: CHEST - 2 VIEW COMPARISON:  09/06/2017 and earlier. FINDINGS: Upright AP and lateral views of the chest. Stable mild cardiomegaly and mediastinal contours. Visualized tracheal air column is within normal limits. Stable lung volumes. No pneumothorax, pleural effusion or consolidation. Chronic increased pulmonary interstitial markings appear stable. No acute pulmonary opacity identified. Stable visualized osseous structures. IMPRESSION: Stable.  No acute cardiopulmonary abnormality. Electronically Signed   By: Genevie Ann M.D.   On: 01/27/2018 17:43    Procedures .Marland KitchenLaceration Repair Date/Time: 01/28/2018 2:22 AM Performed by: Courtney Paris, MD Authorized by: Courtney Paris, MD   Consent:    Consent obtained:  Verbal   Consent given by:  Patient   Risks discussed:  Infection, pain, poor cosmetic result and need for additional repair   Alternatives discussed:  No treatment Anesthesia (see MAR for exact dosages):    Anesthesia method:  None Laceration details:    Location:  Scalp   Scalp location:  R parietal   Length (cm):  0.5   Depth (mm):  1 Repair type:    Repair type:  Simple Pre-procedure details:     Preparation:  Patient was prepped and draped in usual sterile fashion Exploration:    Wound exploration: entire depth of wound probed and visualized     Contaminated: no   Treatment:    Area cleansed with:  Saline   Amount of cleaning:  Standard   Irrigation solution:  Sterile water   Irrigation method:  Syringe   Visualized foreign bodies/material removed: no   Skin repair:    Repair method:  Staples   Number of staples:  1 Approximation:    Approximation:  Close   Vermilion border: well-aligned   Post-procedure details:    Dressing:  Open (no dressing)   Patient tolerance of procedure:  Tolerated well, no immediate complications   (including critical care time)  Medications Ordered in ED Medications  Tdap (BOOSTRIX) injection 0.5 mL (not administered)     Initial Impression / Assessment and Plan / ED Course  I have reviewed the triage vital signs and the nursing notes.  Pertinent labs & imaging results that were available during my care of the patient were reviewed by me and considered in my medical decision making (see chart for details).     DELANY STEURY is a 84 y.o. female with a past medical significant for hypertension, hyperlipidemia, CAD status post PCI, carotid stenosis status post carotid endarterectomy, CHF, COPD with pulmonary fibrosis on 6 L nasal cannula baseline,    thyroid disease, and peripheral vascular disease who presents with a syncopal episode.  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  According to patient, she normally uses 6 L but was trying to save oxygen and was only on 4 L.  Patient reports after walking in the hall she began to feel bad  and someone noticed she did not look quite right.  Patient then syncopized.  She denied any preceding palpitations, chest pain, vision changes, nausea, vomiting, or pain.  She reports that she is unsure how long she was unconscious for but woke up asymptomatic.  She  did hit her right parietal scalp on the floor and has a very small injury that is now hemostatic.  Patient was brought into the emergency department evaluation.  Patient was hypoxic in the 80s upon arrival of the response team.  On my initial evaluation, patient is denying any symptoms.  She denies any chest pain, shortness breath, abdominal pain, or other complaints.  She says that her legs are similar in regards to previous edema.  She denies recent fevers, chills, congestion, or cough.  She denies any recent trauma.  On exam, patient had very faint crackles in the bases.  No significant rhonchi or wheezing.  Slight murmur appreciated but otherwise no chest tenderness.  No abdominal tenderness.  Patient has a superficial laceration to the right parietal scalp.  Patient had no focal neurologic deficits on my initial exam.  Legs had mild edema that family reports is unchanged prior.  Patient does have bruising all over her body which she reports is chronic.  Patient will workup to look for etiology of syncope.  I suspect patient was hypoxic and became lightheaded.  Patient will have workup to look for occult infection, fluid overload, or significant electrolyte abnormality.    Patient laceration was cleaned and treated with one staple.  Wound was well-appearing and hemostatic.  Patient will follow up with PCP for staple removal next week.  Tetanus updated.  Anticipate shared decision making conversation to determine disposition after workup is completed.  Diagnostic testing results seen above.  Patient was found to have elevated BNP however, this is similar to prior readings only slightly more elevated.  Troponin negative.  Magnesium normal.  Metabolic panel overall reassuring.  No evidence of pneumonia on chest x-ray and EKG were reassuring.  Suspect patient's hypoxia was due to her decreased oxygen use and symptomatic hypoxia.  Patient was ambulated and did drop down on her home oxygen into the 70s  however, patient states she was feeling well and does not want admission.  She reports that she will call her pulmonologist tomorrow to discuss further management.  Shared decision making conversation was held and she was offered admission for syncope in the setting of hypoxia and increased oxygen need at home.  Patient continues to refuse admission and understands risks including worsening symptoms and death.  Patient had no other questions or concerns and was discharged in good condition.    Final Clinical Impressions(s) / ED Diagnoses   Final diagnoses:  Syncope, unspecified syncope type  Hypoxia  Laceration of scalp, initial encounter    ED Discharge Orders    None       Clinical Impression: 1. Syncope, unspecified syncope type   2. Hypoxia   3. Laceration of scalp, initial encounter     Disposition: Discharge  Condition: Good  I have discussed the results, Dx and Tx plan with the pt(& family if present). He/she/they expressed understanding and agree(s) with the plan. Discharge instructions discussed at great length. Strict return precautions discussed and pt &/or family have verbalized understanding of the instructions. No further questions at time of discharge.    Discharge Medication List as of 01/27/2018  9:47 PM      Follow Up: [name], MD [address]  [place] [address] [place] [address] [id number] mc [place] [phone]       Grisel Blumenstock, Gwenyth Allegra, MD 01/28/18 [phone]

## 2018-01-27 NOTE — ED Notes (Signed)
Ambulated patient with pulse ox and oxygen.  Resting O2 sat on 6L 90%, ambulated down hall oxygen sat dropped to 72, placed pt on 8L oxygen, pt returned to room

## 2018-01-27 NOTE — ED Notes (Addendum)
Pt increased to 6 L/min of O2, per Angie Fava, RN..  Pt's SpO2 increased to 100% within a minute.

## 2018-01-28 ENCOUNTER — Telehealth: Payer: Self-pay | Admitting: Pulmonary Disease

## 2018-01-28 ENCOUNTER — Ambulatory Visit (INDEPENDENT_AMBULATORY_CARE_PROVIDER_SITE_OTHER): Payer: Medicare Other | Admitting: Internal Medicine

## 2018-01-28 ENCOUNTER — Encounter: Payer: Self-pay | Admitting: Internal Medicine

## 2018-01-28 VITALS — BP 130/68 | HR 70 | Ht 62.5 in | Wt 155.0 lb

## 2018-01-28 DIAGNOSIS — J9611 Chronic respiratory failure with hypoxia: Secondary | ICD-10-CM

## 2018-01-28 DIAGNOSIS — J449 Chronic obstructive pulmonary disease, unspecified: Secondary | ICD-10-CM

## 2018-01-28 DIAGNOSIS — I779 Disorder of arteries and arterioles, unspecified: Secondary | ICD-10-CM

## 2018-01-28 LAB — URINE CULTURE

## 2018-01-28 NOTE — Telephone Encounter (Signed)
Spoke with [name]. She stated that her mother was volunteering at the hospital yesterday and fainted. She doesn't not remember what happened before falling. She just remembers waking up and having a bunch of people standing around her. She checked her O2 stats and they were extremely low. She was taken to the ED and was released.   [name] just wants a second opinion. Explained to her RA was not in the clinic today, but we could get her scheduled with another provider. Patient has been scheduled with MW for 2pm. '  Nothing else needed at time of call.

## 2018-01-28 NOTE — Patient Instructions (Addendum)
GERD (REFLUX)  is an extremely common cause of respiratory symptoms just like yours , many times with no obvious heartburn at all.    It can be treated with medication, but also with lifestyle changes including elevation of the head of your bed (ideally with 6 inch  bed blocks),  Smoking cessation, avoidance of late meals, excessive alcohol, and avoid fatty foods, chocolate, peppermint, colas, red wine, and acidic juices such as orange juice.  NO MINT OR MENTHOL PRODUCTS SO NO COUGH DROPS   USE SUGARLESS CANDY INSTEAD (Jolley ranchers or Stover's or Life Savers) or even ice chips will also do - the key is to swallow to prevent all throat clearing. NO OIL BASED VITAMINS - use powdered substitutes.   We will have advanced do  a BEST fit analysis for your ambulatory 02  Add:  Needs echo with bubble study

## 2018-01-28 NOTE — Progress Notes (Signed)
Subjective:    Patient ID: [name], female    DOB: September 09, 1929, 84 y.o.   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    her dyspnea is out of proportion to her degree of COPD, also of note she quit smoking in 1995. The diffusion capacity on PFTs is decreased out of proportion to degree of airway obstruction as noted by FEV1   She smoked about 40 pyrs  before she quit in 1995 when she had coronary angioplasty. Echo has shown diastolic dysfunction and moderate pulmonary hypertension    04/24/2016  Chief Complaint  Patient presents with  . Follow-up    breathing is doing well. only gets out of breath when walking a long distance, doing much better with exertion.    On her last visit, we put her on Spiriva The seems to have helped her breathing and she seems to be able to walk for longer distances. She continues to walk at [facility] down the hallways She denies orthopnea or paroxysmal nocturnal dyspnea, Oxygen level was low at 89% today rec Stay on spriviva   07/10/2016 acute extended ov/Calyn Sivils re: GOLD II with disproportionate reduction dlco and  new onset hypoxemia / maint rx spiriva  Chief Complaint  Patient presents with  . Acute Visit    RA pt. pt states she had phlebotomy this morning upon [facility]ial to [facility] pt 02 was at 36 quickly recovered to 95 on 2L 02. upon arrival to our office pt 02 was 77 recovered to 90 on 2L.    striking hx in that she really does not complain of much change in doe = MMRC2 = can't walk a nl pace on a flat grade s sob but does fine slow and flat eg can walk the hallways at [employer] [facility] she [facility] though admits she usually sits and rests when she gets to where she's going ie nurses stations. rec Stop spiriva and start stiolto 2 pffs each am  X 2 week sample Please see patient coordinator before you  leave today  to schedule home 02 and portable 02 with goal of sats > 90%    01/28/2018 acute extended ov/Jaesean Litzau re:  COPD GOLD II spirometry / maint rx anoro  Chief Complaint  Patient presents with  . Acute Visit    She states that she fainted while volunteering at [facility] 01/27/18. She states before is happened she was walking and felt dizzy. She states her sats have been lower than usual.   not doing as well x 6 weeks with some cough / congestion /clear white mucus esp in am's Using 6lpm at home but only 2lpm portable despite severe chronic desats when not on adequate 02  Sleeping on 6lpm   No obvious day to day or daytime variability or assoc excess/ purulent sputum or mucus plugs or hemoptysis or cp or chest tightness, subjective wheeze or overt sinus or hb symptoms. No unusual exposure hx or h/o childhood pna/ asthma or knowledge of premature birth.  Sleeping ok flat on 6lpm without nocturnal  or early am exacerbation  of respiratory  c/o's or need for noct saba. Also denies any obvious fluctuation of symptoms with weather or environmental changes or other aggravating or alleviating factors except as outlined above   Current Allergies, Complete Past Medical History,  Past Surgical History, Family History, and Social History were reviewed in [employer] record.  ROS  The following are not active complaints unless bolded Hoarseness, sore throat, dysphagia, dental problems, itching, sneezing,  nasal congestion or discharge of excess mucus or purulent secretions, ear ache,   fever, chills, sweats, unintended wt loss or wt gain, classically pleuritic or exertional cp,  orthopnea pnd or leg swelling, presyncope, palpitations, abdominal pain, anorexia, nausea, vomiting, diarrhea  or change in bowel habits or change in bladder habits, change in stools or change in urine, dysuria, hematuria,  rash, arthralgias, visual complaints, headache, numbness, weakness or ataxia or problems with  walking or coordination,  change in mood/affect or memory.        Current Meds  Medication Sig  . acetaminophen (TYLENOL) 500 MG tablet Take 1,000 mg by mouth at bedtime.   Marland Kitchen albuterol (PROVENTIL HFA;VENTOLIN HFA) 108 (90 Base) MCG/ACT inhaler Inhale 2 puffs into the lungs every 6 (six) hours as needed for wheezing or shortness of breath.  . Aloe-Sodium Chloride (AYR SALINE NASAL GEL NA) Place 1 application into the nose daily as needed (dryness).  Jearl Klinefelter ELLIPTA 62.5-25 MCG/INH AEPB INHALE 1 PUFF INTO THE LUNGS DAILY  . aspirin 81 MG tablet Take 81 mg by mouth daily.    . Carboxymethylcellul-Glycerin (LUBRICATING EYE DROPS OP) Apply 1 drop to eye daily as needed (dry eyes).  . Cholecalciferol (VITAMIN D3) 2000 units capsule Take 2,000 Units by mouth daily.  . Coenzyme Q10 (CO Q 10) 100 MG CAPS Take 100 mg by mouth daily.  . Cyanocobalamin (B-12) 500 MCG TABS Take 500 mcg by mouth daily.  Marland Kitchen denosumab (PROLIA) 60 MG/ML SOLN injection Inject 60 mg into the skin every 6 (six) months. Administer in upper arm, thigh, or abdomen  . folic acid (FOLVITE) 967 MCG tablet Take 400 mcg by mouth every evening.   . furosemide (LASIX) 20 MG tablet Take 1 tablet (20 mg total) by mouth daily.  . isosorbide mononitrate (IMDUR) 30 MG 24 hr tablet TAKE 1 TABLET(30 MG) BY MOUTH DAILY  . levothyroxine (SYNTHROID, LEVOTHROID) 137 MCG tablet Take 137 mcg by mouth daily.    Marland Kitchen losartan (COZAAR) 25 MG tablet Take 1 tablet (25 mg total) by mouth daily.  . metoprolol tartrate (LOPRESSOR) 50 MG tablet Take 1 tablet (50 mg total) by mouth 2 (two) times daily.  . Multiple Vitamin (MULTIVITAMIN WITH MINERALS) TABS tablet Take 1 tablet by mouth daily.  . multivitamin-lutein (OCUVITE-LUTEIN) CAPS capsule Take 1 capsule by mouth daily.  . nitroGLYCERIN (NITROSTAT) 0.4 MG SL tablet Place 0.4 mg under the tongue every 5 (five) minutes as needed for chest pain (x 3 pills).  . Omega-3 Fatty Acids (FISH OIL) 1200 MG CAPS Take 1,200  mg by mouth 2 (two) times daily.  . OXYGEN 4 lpm with sleep and 6lpm all other times  . rosuvastatin (CRESTOR) 40 MG tablet Take 20 mg by mouth at bedtime.                     Significant tests/ events  Chest x-ray shows increased interstitial prominence compared to her older films from 2009 HRCT 01/2016 >> no evidence of interstitial lung disease, mild bibasal scarring  PFTs 01/2016 -FEV1 1.15 - 64%, no BD response, DLCO 22%  Echo 12/29/15  - PAS 64  gr 2 DD, nml LV fn      Objective:   Physical Exam  amb wf nad at rest  01/28/2018        155    07/10/16 172 lb 6.4 oz (78.2 kg)  07/05/16 171 lb 12.8 oz (77.9 kg)  04/24/16 172 lb 8 oz (78.2 kg)    Vital signs reviewed - Note on arrival 02 sats  90% on 6lpm         HEENT: nl dentition, turbinates bilaterally, and oropharynx. Nl external ear canals without cough reflex - edentulous    NECK :  without JVD/Nodes/TM/ nl carotid upstrokes bilaterally   LUNGS: no acc muscle use,  Nl contour chest with distant bs bilaterally but not wheezes/ rhonchi    CV:  RRR  no s3 or murmur or increase in P2, and no edema   ABD:  soft and nontender with nl inspiratory excursion in the supine position. No bruits or organomegaly appreciated, bowel sounds nl  MS:  Nl gait/ ext warm without deformities, calf tenderness, cyanosis or clubbing No obvious joint restrictions   SKIN: warm and dry without lesions    NEURO:  alert, approp, nl sensorium with  no motor or cerebellar deficits apparent.         I personally reviewed images and agree with radiology impression as follows:  CXR:   01/27/18 Upright AP and lateral views of the chest. Stable mild cardiomegaly and mediastinal contours. Visualized tracheal air column is within normal limits. Stable lung volumes. No pneumothorax, pleural effusion or consolidation. Chronic increased pulmonary interstitial markings appear stable. No acute pulmonary opacity identified. Stable  visualized osseous structures.         Assessment & Plan:

## 2018-01-29 ENCOUNTER — Encounter (HOSPITAL_COMMUNITY)
Admission: RE | Admit: 2018-01-29 | Discharge: 2018-01-29 | Disposition: A | Discharge status: Home / Self Care | Payer: Medicare Other | Source: Ambulatory Visit | Attending: Pulmonary Disease | Admitting: Pulmonary Disease

## 2018-01-29 ENCOUNTER — Telehealth: Payer: Self-pay | Admitting: Cardiology

## 2018-01-29 DIAGNOSIS — M1990 Unspecified osteoarthritis, unspecified site: Secondary | ICD-10-CM | POA: Diagnosis not present

## 2018-01-29 DIAGNOSIS — J449 Chronic obstructive pulmonary disease, unspecified: Secondary | ICD-10-CM | POA: Diagnosis not present

## 2018-01-29 DIAGNOSIS — Z86010 Personal history of colonic polyps: Secondary | ICD-10-CM | POA: Diagnosis not present

## 2018-01-29 DIAGNOSIS — E0590 Thyrotoxicosis, unspecified without thyrotoxic crisis or storm: Secondary | ICD-10-CM | POA: Diagnosis not present

## 2018-01-29 DIAGNOSIS — Z79899 Other long term (current) drug therapy: Secondary | ICD-10-CM | POA: Diagnosis not present

## 2018-01-29 DIAGNOSIS — Z9889 Other specified postprocedural states: Secondary | ICD-10-CM | POA: Diagnosis not present

## 2018-01-29 NOTE — Telephone Encounter (Signed)
Returned call to patient.She stated she fell yesterday and went to [facility] ED.She was calling to see if she needs to have bmet done tomorrow since it was just done.Advised she does not have to have done.I will show lab results to [place] when he is in office 01/30/18.

## 2018-01-29 NOTE — Progress Notes (Signed)
Daily Session Note  Patient Details  Name: [name] MRN: [id number] Date of Birth: 10/19/1929 Referring Provider:     Pulmonary Rehab Walk Test from 01/22/2018 in [facility]  Referring Provider  Dr. Alva      Encounter Date: 01/29/2018  Check In: Session Check In - 01/29/18 1110      Check-In   Location  [facility]    Staff Present  [name], MS, ACSM RCEP, Exercise Physiologist;Portia Payne, RN, BSN;Lisa Hughes, RN    Supervising physician immediately available to respond to emergencies  [facility] immediately available    Physician(s)  Dr. Vega    Medication changes reported      No    Fall or balance concerns reported     No    Tobacco Cessation  No Change    Warm-up and Cool-down  Performed as group-led instruction    Resistance Training Performed  Yes    VAD Patient?  No      Pain Assessment   Currently in Pain?  No/denies    Multiple Pain Sites  No       Capillary Blood Glucose: No results found for this or any previous visit (from the past 24 hour(s)).    Social History   Tobacco Use  Smoking Status Former Smoker  . Packs/day: 1.00  . Years: 40.00  . Pack years: 40.00  . Types: Cigarettes  . Last attempt to quit: 11/18/1993  . Years since quitting: 24.2  Smokeless Tobacco Never Used    Goals Met:  Exercise tolerated well No report of cardiac concerns or symptoms Strength training completed today  Goals Unmet:  Not Applicable  Comments: Service time is from 10:30a to 12:35p    Dr. Wesam G. Yacoub is Medical Director for Pulmonary Rehab at [facility]. 

## 2018-01-29 NOTE — Telephone Encounter (Signed)
New Message:    Pt said she had to go to the ER this week and she had lab work.She wants to know does she still need to come for lab work here tomorrow?

## 2018-02-03 ENCOUNTER — Encounter (HOSPITAL_COMMUNITY)
Admission: RE | Admit: 2018-02-03 | Discharge: 2018-02-03 | Disposition: A | Discharge status: Home / Self Care | Payer: Medicare Other | Source: Ambulatory Visit | Attending: Pulmonary Disease | Admitting: Pulmonary Disease

## 2018-02-03 ENCOUNTER — Encounter: Payer: Self-pay | Admitting: Internal Medicine

## 2018-02-03 DIAGNOSIS — Z86010 Personal history of colonic polyps: Secondary | ICD-10-CM | POA: Diagnosis not present

## 2018-02-03 DIAGNOSIS — E0590 Thyrotoxicosis, unspecified without thyrotoxic crisis or storm: Secondary | ICD-10-CM | POA: Diagnosis not present

## 2018-02-03 DIAGNOSIS — Z9889 Other specified postprocedural states: Secondary | ICD-10-CM | POA: Diagnosis not present

## 2018-02-03 DIAGNOSIS — M1990 Unspecified osteoarthritis, unspecified site: Secondary | ICD-10-CM | POA: Diagnosis not present

## 2018-02-03 DIAGNOSIS — Z79899 Other long term (current) drug therapy: Secondary | ICD-10-CM | POA: Diagnosis not present

## 2018-02-03 DIAGNOSIS — J449 Chronic obstructive pulmonary disease, unspecified: Secondary | ICD-10-CM | POA: Diagnosis not present

## 2018-02-03 NOTE — Progress Notes (Signed)
Daily Session Note  Patient Details  Name: [name] MRN: [id number] Date of Birth: 01/25/1929 Referring Provider:     Pulmonary Rehab Walk Test from 01/22/2018 in [facility]  Referring Provider  Dr. Alva      Encounter Date: 02/03/2018  Check In: Session Check In - 02/03/18 1030      Check-In   Location  [facility]    Staff Present  [name], MS, ACSM RCEP, Exercise Physiologist;Portia Payne, RN, BSN;Joan Behrens, RN, BSN    Supervising physician immediately available to respond to emergencies  [facility] immediately available    Physician(s)  Dr. Vega    Medication changes reported      No    Fall or balance concerns reported     No    Tobacco Cessation  No Change    Warm-up and Cool-down  Performed as group-led instruction    Resistance Training Performed  Yes    VAD Patient?  No      Pain Assessment   Currently in Pain?  No/denies    Multiple Pain Sites  No       Capillary Blood Glucose: No results found for this or any previous visit (from the past 24 hour(s)).    Social History   Tobacco Use  Smoking Status Former Smoker  . Packs/day: 1.00  . Years: 40.00  . Pack years: 40.00  . Types: Cigarettes  . Last attempt to quit: 11/18/1993  . Years since quitting: 24.2  Smokeless Tobacco Never Used    Goals Met:  Exercise tolerated well No report of cardiac concerns or symptoms Strength training completed today  Goals Unmet:  Not Applicable- patient did not walk track today related to severe desaturation while walking during last session. Tolerated seated exercise well.  Comments: Service time is from 1030 to 1210   Dr. Wesam G. Yacoub is Medical Director for Pulmonary Rehab at [facility]. 

## 2018-02-03 NOTE — Assessment & Plan Note (Signed)
PFT's  01/31/16   FEV1 1.20 (67 % ) ratio 63  p no % improvement from saba p nothing prior to study with classic copd curvature  DLCO  23 % corrects to 37  % for alv volume   - 07/10/2016  After extensive coaching HFA effectiveness =    75% change spiriva to stiolto > preferred anoro   Pt is Group B in terms of symptom/risk and laba/lama therefore appropriate rx at this point.   Formulary restrictions will be an ongoing challenge for the forseable future and I would be happy to pick an alternative if the pt will first  provide me a list of them but pt  will need to return here for training for any new device that is required eg dpi vs hfa vs respimat.    In meantime we can always provide samples so the patient never runs out of any needed respiratory medications.   For now anoro adequate rx    I had an extended discussion with the patient reviewing all relevant studies completed to date and  lasting 15 to 20 minutes of a 25 minute acute office visit  With pt not seen in 1.5 y  Each maintenance medication was reviewed in detail including most importantly the difference between maintenance and prns and under what circumstances the prns are to be triggered using an action plan format that is not reflected in the computer generated alphabetically organized AVS.    Please see AVS for specific instructions unique to this visit that I personally wrote and verbalized to the the pt in detail and then reviewed with pt  by my nurse highlighting any  changes in therapy recommended at today's visit to their plan of care.

## 2018-02-03 NOTE — Assessment & Plan Note (Signed)
Complicated by secondary polycythemia 07/10/2016 > started on 24 h 02 2lpm rest/sleeping and titrated to > 90% walking  - ono ra 07/22/16 SAT < 89% X 8h 48m :  rec repeat on 2lpm ? Never done   - rx as of 01/28/2018  = 6lpm hs and titrate to keep > 90% while awake

## 2018-02-04 ENCOUNTER — Telehealth: Payer: Self-pay | Admitting: *Deleted

## 2018-02-04 DIAGNOSIS — M25551 Pain in right hip: Secondary | ICD-10-CM | POA: Diagnosis not present

## 2018-02-04 DIAGNOSIS — I272 Pulmonary hypertension, unspecified: Secondary | ICD-10-CM

## 2018-02-04 NOTE — Telephone Encounter (Signed)
Spoke with pt. She is aware that MW wants her to have an echo with bubble study. Order has been placed. Nothing further was needed.

## 2018-02-04 NOTE — Telephone Encounter (Signed)
-----   Message from Tanda Rockers, MD sent at 02/03/2018  2:19 PM EDT ----- Needs echo with bubble study

## 2018-02-04 NOTE — Telephone Encounter (Signed)
Attempted to call pt but no answer.  Left message for pt to return our call x2 

## 2018-02-04 NOTE — Telephone Encounter (Signed)
LMTCB

## 2018-02-04 NOTE — Telephone Encounter (Signed)
Pt is calling back [phone]

## 2018-02-05 ENCOUNTER — Encounter (HOSPITAL_COMMUNITY)
Admission: RE | Admit: 2018-02-05 | Discharge: 2018-02-05 | Disposition: A | Discharge status: Home / Self Care | Payer: Medicare Other | Source: Ambulatory Visit | Attending: Pulmonary Disease | Admitting: Pulmonary Disease

## 2018-02-05 VITALS — Wt 156.1 lb

## 2018-02-05 DIAGNOSIS — Z9889 Other specified postprocedural states: Secondary | ICD-10-CM | POA: Diagnosis not present

## 2018-02-05 DIAGNOSIS — Z79899 Other long term (current) drug therapy: Secondary | ICD-10-CM | POA: Diagnosis not present

## 2018-02-05 DIAGNOSIS — E0590 Thyrotoxicosis, unspecified without thyrotoxic crisis or storm: Secondary | ICD-10-CM | POA: Diagnosis not present

## 2018-02-05 DIAGNOSIS — J449 Chronic obstructive pulmonary disease, unspecified: Secondary | ICD-10-CM

## 2018-02-05 DIAGNOSIS — M1990 Unspecified osteoarthritis, unspecified site: Secondary | ICD-10-CM | POA: Diagnosis not present

## 2018-02-05 DIAGNOSIS — Z86010 Personal history of colonic polyps: Secondary | ICD-10-CM | POA: Diagnosis not present

## 2018-02-05 NOTE — Progress Notes (Signed)
Daily Session Note  Patient Details  Name: [name] MRN: [id number] Date of Birth: [birth year] Referring Provider:     Pulmonary Rehab Walk Test from 01/22/2018 in [place]  Referring Provider  Dr. Elsworth Soho      Encounter Date: 02/05/2018  Check In: Session Check In - 02/05/18 1030      Check-In   Location  [facility]    Staff Present  Rosebud Poles, RN, BSN;[name], MS, ACSM RCEP, Exercise Physiologist;Lisa Ysidro Evert, RN;Portia Rollene Rotunda, RN, BSN    Supervising physician immediately available to respond to emergencies  [facility] immediately available    Physician(s)  DR. Nettey    Medication changes reported      No    Fall or balance concerns reported     No    Tobacco Cessation  No Change    Warm-up and Cool-down  Performed as group-led instruction    Resistance Training Performed  Yes    VAD Patient?  No      Pain Assessment   Currently in Pain?  No/denies    Multiple Pain Sites  No       Capillary Blood Glucose: No results found for this or any previous visit (from the past 24 hour(s)).  Exercise Prescription Changes - 02/05/18 1300      Response to Exercise   Blood Pressure (Admit)  144/52    Blood Pressure (Exercise)  178/72    Blood Pressure (Exit)  128/70    Heart Rate (Admit)  65 bpm    Heart Rate (Exercise)  93 bpm    Heart Rate (Exit)  68 bpm    Oxygen Saturation (Admit)  92 %    Oxygen Saturation (Exercise)  88 %    Oxygen Saturation (Exit)  96 %    Rating of Perceived Exertion (Exercise)  15    Perceived Dyspnea (Exercise)  2    Duration  Progress to 45 minutes of aerobic exercise without signs/symptoms of physical distress    Intensity  Other (comment)      Progression   Progression  Continue to progress workloads to maintain intensity without signs/symptoms of physical distress. 40-80% HRR      Resistance Training   Training Prescription  Yes    Weight  orange bands    Reps  10-15    Time  10 Minutes      Oxygen   Oxygen  Continuous    Liters  15 L, 100% NRB      Recumbant Bike   Level  2.5    Watts  10    Minutes  17      NuStep   Level  4    SPM  80    Minutes  17    METs  2.4       Social History   Tobacco Use  Smoking Status Former Smoker  . Packs/day: 1.00  . Years: 40.00  . Pack years: 40.00  . Types: Cigarettes  . Last attempt to quit: 11/18/1993  . Years since quitting: 24.2  Smokeless Tobacco Never Used    Goals Met:  Exercise tolerated well Strength training completed today  Goals Unmet:  Not Applicable  Comments: Service time is from 1030 to 1245.   Dr. Rush Farmer is Medical Director for Pulmonary Rehab at [facility].

## 2018-02-09 ENCOUNTER — Other Ambulatory Visit: Payer: Self-pay

## 2018-02-09 ENCOUNTER — Ambulatory Visit (HOSPITAL_COMMUNITY): Payer: Medicare Other | Attending: Cardiovascular Disease

## 2018-02-09 DIAGNOSIS — I739 Peripheral vascular disease, unspecified: Secondary | ICD-10-CM | POA: Diagnosis not present

## 2018-02-09 DIAGNOSIS — I272 Pulmonary hypertension, unspecified: Secondary | ICD-10-CM

## 2018-02-09 DIAGNOSIS — I509 Heart failure, unspecified: Secondary | ICD-10-CM | POA: Diagnosis not present

## 2018-02-09 DIAGNOSIS — I110 Hypertensive heart disease with heart failure: Secondary | ICD-10-CM | POA: Diagnosis not present

## 2018-02-09 DIAGNOSIS — E785 Hyperlipidemia, unspecified: Secondary | ICD-10-CM | POA: Diagnosis not present

## 2018-02-09 DIAGNOSIS — J449 Chronic obstructive pulmonary disease, unspecified: Secondary | ICD-10-CM | POA: Insufficient documentation

## 2018-02-09 DIAGNOSIS — R000 Tachycardia, unspecified: Secondary | ICD-10-CM | POA: Diagnosis not present

## 2018-02-09 DIAGNOSIS — I6529 Occlusion and stenosis of unspecified carotid artery: Secondary | ICD-10-CM | POA: Diagnosis not present

## 2018-02-09 DIAGNOSIS — Z87891 Personal history of nicotine dependence: Secondary | ICD-10-CM | POA: Diagnosis not present

## 2018-02-09 DIAGNOSIS — I2510 Atherosclerotic heart disease of native coronary artery without angina pectoris: Secondary | ICD-10-CM | POA: Diagnosis not present

## 2018-02-10 ENCOUNTER — Other Ambulatory Visit: Payer: Self-pay | Admitting: Internal Medicine

## 2018-02-10 ENCOUNTER — Encounter (HOSPITAL_COMMUNITY)
Admission: RE | Admit: 2018-02-10 | Discharge: 2018-02-10 | Disposition: A | Discharge status: Home / Self Care | Payer: Medicare Other | Source: Ambulatory Visit | Attending: Pulmonary Disease | Admitting: Pulmonary Disease

## 2018-02-10 ENCOUNTER — Encounter: Payer: Self-pay | Admitting: Internal Medicine

## 2018-02-10 DIAGNOSIS — M1990 Unspecified osteoarthritis, unspecified site: Secondary | ICD-10-CM | POA: Diagnosis not present

## 2018-02-10 DIAGNOSIS — E0590 Thyrotoxicosis, unspecified without thyrotoxic crisis or storm: Secondary | ICD-10-CM | POA: Diagnosis not present

## 2018-02-10 DIAGNOSIS — J9611 Chronic respiratory failure with hypoxia: Secondary | ICD-10-CM

## 2018-02-10 DIAGNOSIS — Z86010 Personal history of colonic polyps: Secondary | ICD-10-CM | POA: Diagnosis not present

## 2018-02-10 DIAGNOSIS — Z79899 Other long term (current) drug therapy: Secondary | ICD-10-CM | POA: Diagnosis not present

## 2018-02-10 DIAGNOSIS — J449 Chronic obstructive pulmonary disease, unspecified: Secondary | ICD-10-CM

## 2018-02-10 DIAGNOSIS — I2781 Cor pulmonale (chronic): Secondary | ICD-10-CM | POA: Insufficient documentation

## 2018-02-10 DIAGNOSIS — Z9889 Other specified postprocedural states: Secondary | ICD-10-CM | POA: Diagnosis not present

## 2018-02-10 NOTE — Progress Notes (Signed)
Spoke with pt and notified of results per Dr. Melvyn Novas. Pt verbalized understanding and denied any questions. ONO on 2lpm was ordered

## 2018-02-10 NOTE — Progress Notes (Signed)
Daily Session Note  Patient Details  Name: [name] MRN: [id number] Date of Birth: 01-13-1929 Referring Provider:     Pulmonary Rehab Walk Test from 01/22/2018 in [place]  Referring Provider  Dr. Elsworth Soho      Encounter Date: 02/10/2018  Check In: Session Check In - 02/10/18 1212      Check-In   Location  [facility]    Staff Present  Su Hilt, MS, ACSM RCEP, Exercise Physiologist;Nolan Tuazon Ysidro Evert, RN;Portia Rollene Rotunda, RN, BSN    Supervising physician immediately available to respond to emergencies  [facility] immediately available    Physician(s)  DR. Nettey    Medication changes reported      No    Fall or balance concerns reported     No    Tobacco Cessation  No Change    Warm-up and Cool-down  Performed as group-led instruction    Resistance Training Performed  Yes    VAD Patient?  No      Pain Assessment   Currently in Pain?  No/denies    Multiple Pain Sites  No       Capillary Blood Glucose: No results found for this or any previous visit (from the past 24 hour(s)).    Social History   Tobacco Use  Smoking Status Former Smoker  . Packs/day: 1.00  . Years: 40.00  . Pack years: 40.00  . Types: Cigarettes  . Last attempt to quit: 11/18/1993  . Years since quitting: 24.2  Smokeless Tobacco Never Used    Goals Met:  Exercise tolerated well No report of cardiac concerns or symptoms Strength training completed today  Goals Unmet:  Not Applicable  Comments: Service time is from 1030 to 1205    Dr. Rush Farmer is Medical Director for Pulmonary Rehab at [facility].

## 2018-02-12 ENCOUNTER — Encounter (HOSPITAL_COMMUNITY)
Admission: RE | Admit: 2018-02-12 | Discharge: 2018-02-12 | Disposition: A | Discharge status: Home / Self Care | Payer: Medicare Other | Source: Ambulatory Visit | Attending: Pulmonary Disease | Admitting: Pulmonary Disease

## 2018-02-12 DIAGNOSIS — E0590 Thyrotoxicosis, unspecified without thyrotoxic crisis or storm: Secondary | ICD-10-CM | POA: Diagnosis not present

## 2018-02-12 DIAGNOSIS — J449 Chronic obstructive pulmonary disease, unspecified: Secondary | ICD-10-CM | POA: Diagnosis not present

## 2018-02-12 DIAGNOSIS — Z86010 Personal history of colonic polyps: Secondary | ICD-10-CM | POA: Diagnosis not present

## 2018-02-12 DIAGNOSIS — Z79899 Other long term (current) drug therapy: Secondary | ICD-10-CM | POA: Diagnosis not present

## 2018-02-12 DIAGNOSIS — Z9889 Other specified postprocedural states: Secondary | ICD-10-CM | POA: Diagnosis not present

## 2018-02-12 DIAGNOSIS — M1990 Unspecified osteoarthritis, unspecified site: Secondary | ICD-10-CM | POA: Diagnosis not present

## 2018-02-12 NOTE — Progress Notes (Signed)
Daily Session Note  Patient Details  Name: [name] MRN: [id number] Date of Birth: [birth year] Referring Provider:     Pulmonary Rehab Walk Test from 01/22/2018 in [place]  Referring Provider  Dr. Elsworth Soho      Encounter Date: 02/12/2018  Check In: Session Check In - 02/12/18 1223      Check-In   Location  [facility]    Staff Present  [name], RN, BSN;[name], MS, ACSM RCEP, Exercise Physiologist;[name], RN    Supervising physician immediately available to respond to emergencies  [facility] immediately available    Physician(s)  Dr. Eliseo Squires    Medication changes reported      No    Fall or balance concerns reported     No    Tobacco Cessation  No Change    Warm-up and Cool-down  Performed as group-led instruction    Resistance Training Performed  Yes    VAD Patient?  No      Pain Assessment   Currently in Pain?  No/denies    Multiple Pain Sites  No       Capillary Blood Glucose: No results found for this or any previous visit (from the past 24 hour(s)).    Social History   Tobacco Use  Smoking Status Former Smoker  . Packs/day: 1.00  . Years: 40.00  . Pack years: 40.00  . Types: Cigarettes  . Last attempt to quit: 11/18/1993  . Years since quitting: 24.2  Smokeless Tobacco Never Used    Goals Met:  Exercise tolerated well No report of cardiac concerns or symptoms Strength training completed today  Goals Unmet:  Not Applicable  Comments:  Service time is from 1030 to 1215      Dr. Rush Farmer is Medical Director for Pulmonary Rehab at [facility].

## 2018-02-12 NOTE — Progress Notes (Signed)
[name] 84 y.o. female   DOB: [birth year] MRN: [id number]          Nutrition Dx: COPD Gold stage II Past Medical History:  Diagnosis Date  . Arthritis   . B12 deficiency   . Basal cell cancer    Nose  . Carotid artery occlusion   . Cataract   . Chronic diastolic CHF (congestive heart failure) ([place])   . Colon polyps   . COPD (chronic obstructive pulmonary disease) ([place])    STAGE 2  . Coronary artery disease    status post angioplasty of the mid RCA in 1995  . Dyspnea   . First degree atrioventricular block   . History of obesity   . Hyperlipidemia   . Hypertension    well controlled  . Hyperthyroidism   . Lichen sclerosus et atrophicus of the vulva   . Osteopenia   . PAD (peripheral artery disease) ([facility])    with stable claudication  . Pulmonary fibrosis ([place])    wears 4L home O2 at baseline   Meds reviewed.   Ht: Ht Readings from Last 1 Encounters:  01/28/18 5' 2.5" (1.588 m)     Wt:  Wt Readings from Last 3 Encounters:  02/05/18 156 lb 1.4 oz (70.8 kg)  01/28/18 155 lb (70.3 kg)  01/27/18 156 lb (70.8 kg)     BMI: 28.08    Current tobacco use? No  Labs:  Lipid Panel  No results found for: CHOL, TRIG, HDL, CHOLHDL, VLDL, LDLCALC, LDLDIRECT  Lab Results  Component Value Date   HGBA1C 5.0 12/28/2016   Note Spoke with pt. Pt is overweight. Making healthy food choices the majority of the time.  Pt's Rate Your Plate results reviewed with pt. Pt avoids most salty food; does not use canned/ convenience food often.The role of sodium in lung disease reviewed with pt. Pt expressed understanding of the information reviewed via feedback method.    Nutrition Diagnosis ? Food-and nutrition-related knowledge deficit related to lack of exposure to information as related to diagnosis of pulmonary disease  Nutrition Intervention ? Pt's individual nutrition plan and goals reviewed with pt. ? Benefits of adopting healthy eating habits discussed when pt's Rate Your  Plate reviewed.  Goal(s) 1. Describe the benefit of including fruits, vegetables, whole grains, and low-fat dairy products in a healthy meal plan.  Plan:  Pt to attend Pulmonary Nutrition class Will provide client-centered nutrition education as part of interdisciplinary care.   Monitor and evaluate progress toward nutrition goal with team.  Monitor and Evaluate progress toward nutrition goal with team.   Derek Mound, M.Ed, RD, LDN, CDE 02/12/2018 12:21 PM

## 2018-02-17 ENCOUNTER — Encounter (HOSPITAL_COMMUNITY): Payer: Medicare Other

## 2018-02-17 ENCOUNTER — Encounter (HOSPITAL_COMMUNITY)
Admission: RE | Admit: 2018-02-17 | Discharge: 2018-02-17 | Disposition: A | Discharge status: Home / Self Care | Payer: Medicare Other | Source: Ambulatory Visit | Attending: Pulmonary Disease | Admitting: Pulmonary Disease

## 2018-02-17 VITALS — Wt 154.8 lb

## 2018-02-17 DIAGNOSIS — E0590 Thyrotoxicosis, unspecified without thyrotoxic crisis or storm: Secondary | ICD-10-CM | POA: Diagnosis not present

## 2018-02-17 DIAGNOSIS — J449 Chronic obstructive pulmonary disease, unspecified: Secondary | ICD-10-CM | POA: Diagnosis not present

## 2018-02-17 DIAGNOSIS — Z9889 Other specified postprocedural states: Secondary | ICD-10-CM | POA: Insufficient documentation

## 2018-02-17 DIAGNOSIS — Z79899 Other long term (current) drug therapy: Secondary | ICD-10-CM | POA: Diagnosis not present

## 2018-02-17 DIAGNOSIS — Z87891 Personal history of nicotine dependence: Secondary | ICD-10-CM | POA: Insufficient documentation

## 2018-02-17 DIAGNOSIS — M1990 Unspecified osteoarthritis, unspecified site: Secondary | ICD-10-CM | POA: Diagnosis not present

## 2018-02-17 DIAGNOSIS — E785 Hyperlipidemia, unspecified: Secondary | ICD-10-CM | POA: Diagnosis not present

## 2018-02-17 DIAGNOSIS — J8410 Pulmonary fibrosis, unspecified: Secondary | ICD-10-CM | POA: Diagnosis not present

## 2018-02-17 DIAGNOSIS — R0602 Shortness of breath: Secondary | ICD-10-CM | POA: Insufficient documentation

## 2018-02-17 DIAGNOSIS — I5032 Chronic diastolic (congestive) heart failure: Secondary | ICD-10-CM | POA: Insufficient documentation

## 2018-02-17 DIAGNOSIS — I739 Peripheral vascular disease, unspecified: Secondary | ICD-10-CM | POA: Insufficient documentation

## 2018-02-17 DIAGNOSIS — I110 Hypertensive heart disease with heart failure: Secondary | ICD-10-CM | POA: Insufficient documentation

## 2018-02-17 DIAGNOSIS — Z86010 Personal history of colonic polyps: Secondary | ICD-10-CM | POA: Insufficient documentation

## 2018-02-17 DIAGNOSIS — I440 Atrioventricular block, first degree: Secondary | ICD-10-CM | POA: Diagnosis not present

## 2018-02-17 NOTE — Progress Notes (Signed)
Daily Session Note  Patient Details  Name: ARYNN ARMAND MRN: [id number] Date of Birth: 04-Oct-1929 Referring Provider:     Pulmonary Rehab Walk Test from 01/22/2018 in [place]  Referring Provider  Dr. Elsworth Soho      Encounter Date: 02/17/2018  Check In: Session Check In - 02/17/18 1214      Check-In   Location  [facility]    Staff Present  [name], RN, BSN;[name], MS, ACSM RCEP, Exercise Physiologist;Lisa Ysidro Evert, RN    Supervising physician immediately available to respond to emergencies  [facility] immediately available    Physician(s)  Dr. Posey Pronto    Medication changes reported      No    Fall or balance concerns reported     No    Tobacco Cessation  No Change    Warm-up and Cool-down  Performed as group-led instruction    Resistance Training Performed  Yes    VAD Patient?  No      Pain Assessment   Currently in Pain?  No/denies    Multiple Pain Sites  No       Capillary Blood Glucose: No results found for this or any previous visit (from the past 24 hour(s)).  Exercise Prescription Changes - 02/17/18 1231      Response to Exercise   Blood Pressure (Admit)  122/58    Blood Pressure (Exercise)  146/50    Blood Pressure (Exit)  128/58    Heart Rate (Admit)  74 bpm    Heart Rate (Exercise)  92 bpm    Heart Rate (Exit)  81 bpm    Oxygen Saturation (Admit)  98 %    Oxygen Saturation (Exercise)  89 %    Oxygen Saturation (Exit)  100 %    Rating of Perceived Exertion (Exercise)  13    Perceived Dyspnea (Exercise)  1    Duration  Progress to 45 minutes of aerobic exercise without signs/symptoms of physical distress    Intensity  Other (comment)      Progression   Progression  Continue to progress workloads to maintain intensity without signs/symptoms of physical distress. 40-80% HRR      Resistance Training   Training Prescription  Yes    Weight  orange bands    Reps  10-15    Time  10 Minutes       Oxygen   Oxygen  Continuous    Liters  15 L, 100% NRB      Recumbant Bike   Level  2.5    Watts  10    Minutes  17      NuStep   Level  4    SPM  80    Minutes  34    METs  2.4       Social History   Tobacco Use  Smoking Status Former Smoker  . Packs/day: 1.00  . Years: 40.00  . Pack years: 40.00  . Types: Cigarettes  . Last attempt to quit: 11/18/1993  . Years since quitting: 24.2  Smokeless Tobacco Never Used    Goals Met:  Using PLB without cueing & demonstrates good technique Exercise tolerated well No report of cardiac concerns or symptoms Strength training completed today  Goals Unmet:  Not Applicable  Comments: Service time is from 1030 to 1210   Dr. Rush Farmer is Medical Director for Pulmonary Rehab at [facility].

## 2018-02-19 ENCOUNTER — Encounter (HOSPITAL_COMMUNITY): Payer: Medicare Other

## 2018-02-23 NOTE — Progress Notes (Signed)
Pulmonary Individual Treatment Plan  Patient Details  Name: [name] MRN: [id number] Date of Birth: [birth year] Referring Provider:     Pulmonary Rehab Walk Test from 01/22/2018 in [place]  Referring Provider  Dr. Elsworth Soho      Initial Encounter Date:    Pulmonary Rehab Walk Test from 01/22/2018 in [place]  Date  01/22/18  Referring Provider  Dr. Elsworth Soho      Visit Diagnosis: Chronic obstructive pulmonary disease, unspecified COPD type ([place])  Patient's Home Medications on Admission:   Current Outpatient Medications:  .  acetaminophen (TYLENOL) 500 MG tablet, Take 1,000 mg by mouth at bedtime. , Disp: , Rfl:  .  albuterol (PROVENTIL HFA;VENTOLIN HFA) 108 (90 Base) MCG/ACT inhaler, Inhale 2 puffs into the lungs every 6 (six) hours as needed for wheezing or shortness of breath., Disp: 1 Inhaler, Rfl: 1 .  Aloe-Sodium Chloride (AYR SALINE NASAL GEL NA), Place 1 application into the nose daily as needed (dryness)., Disp: , Rfl:  .  ANORO ELLIPTA 62.5-25 MCG/INH AEPB, INHALE 1 PUFF INTO THE LUNGS DAILY, Disp: 60 each, Rfl: 3 .  aspirin 81 MG tablet, Take 81 mg by mouth daily.  , Disp: , Rfl:  .  Carboxymethylcellul-Glycerin (LUBRICATING EYE DROPS OP), Apply 1 drop to eye daily as needed (dry eyes)., Disp: , Rfl:  .  Cholecalciferol (VITAMIN D3) 2000 units capsule, Take 2,000 Units by mouth daily., Disp: , Rfl:  .  Coenzyme Q10 (CO Q 10) 100 MG CAPS, Take 100 mg by mouth daily., Disp: , Rfl:  .  Cyanocobalamin (B-12) 500 MCG TABS, Take 500 mcg by mouth daily., Disp: , Rfl:  .  denosumab (PROLIA) 60 MG/ML SOLN injection, Inject 60 mg into the skin every 6 (six) months. Administer in upper arm, thigh, or abdomen, Disp: , Rfl:  .  folic acid (FOLVITE) 932 MCG tablet, Take 400 mcg by mouth every evening. , Disp: , Rfl:  .  furosemide (LASIX) 20 MG tablet, Take 1 tablet (20 mg total) by mouth daily., Disp: 90 tablet, Rfl: 3 .   isosorbide mononitrate (IMDUR) 30 MG 24 hr tablet, TAKE 1 TABLET(30 MG) BY MOUTH DAILY, Disp: 90 tablet, Rfl: 2 .  levothyroxine (SYNTHROID, LEVOTHROID) 137 MCG tablet, Take 137 mcg by mouth daily.  , Disp: , Rfl:  .  losartan (COZAAR) 25 MG tablet, Take 1 tablet (25 mg total) by mouth daily., Disp: 30 tablet, Rfl: 6 .  metoprolol tartrate (LOPRESSOR) 50 MG tablet, Take 1 tablet (50 mg total) by mouth 2 (two) times daily., Disp: 180 tablet, Rfl: 3 .  Multiple Vitamin (MULTIVITAMIN WITH MINERALS) TABS tablet, Take 1 tablet by mouth daily., Disp: , Rfl:  .  multivitamin-lutein (OCUVITE-LUTEIN) CAPS capsule, Take 1 capsule by mouth daily., Disp: , Rfl:  .  nitroGLYCERIN (NITROSTAT) 0.4 MG SL tablet, Place 0.4 mg under the tongue every 5 (five) minutes as needed for chest pain (x 3 pills)., Disp: , Rfl:  .  Omega-3 Fatty Acids (FISH OIL) 1200 MG CAPS, Take 1,200 mg by mouth 2 (two) times daily., Disp: , Rfl:  .  OXYGEN, 4 lpm with sleep and 6lpm all other times, Disp: , Rfl:  .  rosuvastatin (CRESTOR) 40 MG tablet, Take 20 mg by mouth at bedtime. , Disp: , Rfl:   Past Medical History: Past Medical History:  Diagnosis Date  . Arthritis   . B12 deficiency   . Basal cell cancer  Nose  . Carotid artery occlusion   . Cataract   . Chronic diastolic CHF (congestive heart failure) ([place])   . Colon polyps   . COPD (chronic obstructive pulmonary disease) ([place])    STAGE 2  . Coronary artery disease    status post angioplasty of the mid RCA in 1995  . Dyspnea   . First degree atrioventricular block   . History of obesity   . Hyperlipidemia   . Hypertension    well controlled  . Hyperthyroidism   . Lichen sclerosus et atrophicus of the vulva   . Osteopenia   . PAD (peripheral artery disease) ([facility])    with stable claudication  . Pulmonary fibrosis ([facility])    wears 4L home O2 at baseline    Tobacco Use: Social History   Tobacco Use  Smoking Status Former Smoker  . Packs/day: 1.00  . Years:  40.00  . Pack years: 40.00  . Types: Cigarettes  . Last attempt to quit: 11/18/1993  . Years since quitting: 24.2  Smokeless Tobacco Never Used    Labs: Recent Chemical engineer    Labs for ITP Cardiac and Pulmonary Rehab Latest Ref Rng & Units 05/27/2008 12/28/2016   Hemoglobin A1c 4.8 - 5.6 % - 5.0   TCO2 - 23 -      Capillary Blood Glucose: Lab Results  Component Value Date   GLUCAP 96 12/30/2016   GLUCAP 97 12/30/2016   GLUCAP 132 (H) 12/29/2016   GLUCAP 140 (H) 12/29/2016   GLUCAP 123 (H) 12/29/2016     Pulmonary Assessment Scores: Pulmonary Assessment Scores    Row Name 01/15/18 1746 01/23/18 0704       ADL UCSD   ADL Phase  Entry  Entry    SOB Score total  49  -      CAT Score   CAT Score  20 Entry  -      mMRC Score   mMRC Score  -  0       Pulmonary Function Assessment: Pulmonary Function Assessment - 01/12/18 1453      Breath   Bilateral Breath Sounds  Clear    Shortness of Breath  Limiting activity;Yes       Exercise Target Goals:    Exercise Program Goal: Individual exercise prescription set using results from initial 6 min walk test and THRR while considering  patient's activity barriers and safety.    Exercise Prescription Goal: Initial exercise prescription builds to 30-45 minutes a day of aerobic activity, 2-3 days per week.  Home exercise guidelines will be given to patient during program as part of exercise prescription that the participant will acknowledge.  Activity Barriers & Risk Stratification: Activity Barriers & Cardiac Risk Stratification - 01/12/18 1436      Activity Barriers & Cardiac Risk Stratification   Activity Barriers  Shortness of Breath;Deconditioning       6 Minute Walk: 6 Minute Walk    Row Name 01/23/18 0700         6 Minute Walk   Phase  Initial     Distance  800 feet     Walk Time  - 5 minutes 10 seconds     # of Rest Breaks  1 stopped by ep due to low sat     MPH  1.51     METS  2.15     RPE   13     Perceived Dyspnea   2     Symptoms  Yes (comment)     Comments  used wheelchair and forehead probe-very slow o2 recovery     Resting HR  68 bpm     Resting BP  162/78     Resting Oxygen Saturation   91 %     Exercise Oxygen Saturation  during 6 min walk  78 %     Max Ex. HR  137 bpm     Max Ex. BP  180/64       Interval HR   1 Minute HR  100     2 Minute HR  118     3 Minute HR  130     4 Minute HR  132     5 Minute HR  132     6 Minute HR  137     2 Minute Post HR  110     Interval Heart Rate?  Yes       Interval Oxygen   Interval Oxygen?  Yes     Baseline Oxygen Saturation %  91 %     1 Minute Oxygen Saturation %  95 %     1 Minute Liters of Oxygen  10 L     2 Minute Oxygen Saturation %  84 %     2 Minute Liters of Oxygen  10 L     3 Minute Oxygen Saturation %  81 %     3 Minute Liters of Oxygen  15 L     4 Minute Oxygen Saturation %  85 % rest break by EP     4 Minute Liters of Oxygen  15 L     5 Minute Oxygen Saturation %  85 %     5 Minute Liters of Oxygen  15 L     6 Minute Oxygen Saturation %  78 %     6 Minute Liters of Oxygen  15 L     2 Minute Post Oxygen Saturation %  82 % did not recover to 91% until 4 minutes post 17mt     2 Minute Post Liters of Oxygen  10 L        Oxygen Initial Assessment: Oxygen Initial Assessment - 01/23/18 0703      Initial 6 min Walk   Oxygen Used  Continuous;E-Tanks    Liters per minute  15      Program Oxygen Prescription   Program Oxygen Prescription  Continuous;E-Tanks    Liters per minute  -- 10-15    Comments  Will get guidlines from Dr. AElsworth Sohofor O2 Sat and liter flow       Oxygen Re-Evaluation: Oxygen Re-Evaluation    Row Name 01/26/18 1346 02/20/18 1045           Program Oxygen Prescription   Program Oxygen Prescription  Continuous;E-Tanks  Continuous;E-Tanks      Liters per minute  -  15 NRB      Comments  -  Patient needs to stay above 90%--will try 25 liters        Home Oxygen   Home Oxygen  Device  -  Portable Concentrator;Home Concentrator;E-Tanks      Sleep Oxygen Prescription  -  Continuous      Liters per minute  -  6      Home Exercise Oxygen Prescription  -  Continuous Exercise at home is not encouraged at this time.      Liters per minute  -  15  Home at Rest Exercise Oxygen Prescription  -  Continuous      Liters per minute  -  6      Compliance with Home Oxygen Use  -  Yes        Goals/Expected Outcomes   Short Term Goals  -  To learn and exhibit compliance with exercise, home and travel O2 prescription;To learn and understand importance of monitoring SPO2 with pulse oximeter and demonstrate accurate use of the pulse oximeter.;To learn and understand importance of maintaining oxygen saturations>88%;To learn and demonstrate proper pursed lip breathing techniques or other breathing techniques.;To learn and demonstrate proper use of respiratory medications      Long  Term Goals  -  Exhibits compliance with exercise, home and travel O2 prescription;Verbalizes importance of monitoring SPO2 with pulse oximeter and return demonstration;Maintenance of O2 saturations>88%;Exhibits proper breathing techniques, such as pursed lip breathing or other method taught during program session;Compliance with respiratory medication;Demonstrates proper use of MDI's      Goals/Expected Outcomes  -  compliance         Oxygen Discharge (Final Oxygen Re-Evaluation): Oxygen Re-Evaluation - 02/20/18 1045      Program Oxygen Prescription   Program Oxygen Prescription  Continuous;E-Tanks    Liters per minute  15 NRB    Comments  Patient needs to stay above 90%--will try 25 liters      Home Oxygen   Home Oxygen Device  Portable Concentrator;Home Concentrator;E-Tanks    Sleep Oxygen Prescription  Continuous    Liters per minute  6    Home Exercise Oxygen Prescription  Continuous Exercise at home is not encouraged at this time.    Liters per minute  15    Home at Rest Exercise Oxygen  Prescription  Continuous    Liters per minute  6    Compliance with Home Oxygen Use  Yes      Goals/Expected Outcomes   Short Term Goals  To learn and exhibit compliance with exercise, home and travel O2 prescription;To learn and understand importance of monitoring SPO2 with pulse oximeter and demonstrate accurate use of the pulse oximeter.;To learn and understand importance of maintaining oxygen saturations>88%;To learn and demonstrate proper pursed lip breathing techniques or other breathing techniques.;To learn and demonstrate proper use of respiratory medications    Long  Term Goals  Exhibits compliance with exercise, home and travel O2 prescription;Verbalizes importance of monitoring SPO2 with pulse oximeter and return demonstration;Maintenance of O2 saturations>88%;Exhibits proper breathing techniques, such as pursed lip breathing or other method taught during program session;Compliance with respiratory medication;Demonstrates proper use of MDI's    Goals/Expected Outcomes  compliance       Initial Exercise Prescription: Initial Exercise Prescription - 01/23/18 0700      Date of Initial Exercise RX and Referring Provider   Date  01/22/18    Referring Provider  Dr. Elsworth Soho      Oxygen   Oxygen  Continuous    Liters  -- 10-15      Recumbant Bike   Level  2    Watts  10    Minutes  17      NuStep   Level  2    SPM  80    Minutes  17    METs  1.5      Track   Laps  5    Minutes  17      Prescription Details   Frequency (times per week)  2    Duration  Progress to 45 minutes of aerobic exercise without signs/symptoms of physical distress      Intensity   THRR 40-80% of Max Heartrate  54-107    Ratings of Perceived Exertion  11-13    Perceived Dyspnea  0-4      Progression   Progression  Continue progressive overload as per policy without signs/symptoms or physical distress.      Resistance Training   Training Prescription  Yes    Weight  orange bands    Reps  10-15        Perform Capillary Blood Glucose checks as needed.  Exercise Prescription Changes: Exercise Prescription Changes    Row Name 02/05/18 1300 02/17/18 1231           Response to Exercise   Blood Pressure (Admit)  144/52  122/58      Blood Pressure (Exercise)  178/72  146/50      Blood Pressure (Exit)  128/70  128/58      Heart Rate (Admit)  65 bpm  74 bpm      Heart Rate (Exercise)  93 bpm  92 bpm      Heart Rate (Exit)  68 bpm  81 bpm      Oxygen Saturation (Admit)  92 %  98 %      Oxygen Saturation (Exercise)  88 %  89 %      Oxygen Saturation (Exit)  96 %  100 %      Rating of Perceived Exertion (Exercise)  15  13      Perceived Dyspnea (Exercise)  2  1      Duration  Progress to 45 minutes of aerobic exercise without signs/symptoms of physical distress  Progress to 45 minutes of aerobic exercise without signs/symptoms of physical distress      Intensity  Other (comment)  Other (comment)        Progression   Progression  Continue to progress workloads to maintain intensity without signs/symptoms of physical distress. 40-80% HRR  Continue to progress workloads to maintain intensity without signs/symptoms of physical distress. 40-80% HRR        Resistance Training   Training Prescription  Yes  Yes      Weight  orange bands  orange bands      Reps  10-15  10-15      Time  10 Minutes  10 Minutes        Oxygen   Oxygen  Continuous  Continuous      Liters  15 L, 100% NRB  15 L, 100% NRB        Recumbant Bike   Level  2.5  2.5      Watts  10  10      Minutes  17  17        NuStep   Level  4  4      SPM  80  80      Minutes  17  34      METs  2.4  2.4         Exercise Comments:   Exercise Goals and Review: Exercise Goals    Row Name 01/12/18 1437             Exercise Goals   Increase Physical Activity  Yes       Intervention  Provide advice, education, support and counseling about physical activity/exercise needs.;Develop an individualized exercise  prescription for aerobic and resistive training based on  initial evaluation findings, risk stratification, comorbidities and participant's personal goals.       Expected Outcomes  Short Term: Attend rehab on a regular basis to increase amount of physical activity.;Long Term: Add in home exercise to make exercise part of routine and to increase amount of physical activity.;Long Term: Exercising regularly at least 3-5 days a week.       Increase Strength and Stamina  Yes       Intervention  Provide advice, education, support and counseling about physical activity/exercise needs.;Develop an individualized exercise prescription for aerobic and resistive training based on initial evaluation findings, risk stratification, comorbidities and participant's personal goals.       Expected Outcomes  Short Term: Increase workloads from initial exercise prescription for resistance, speed, and METs.;Short Term: Perform resistance training exercises routinely during rehab and add in resistance training at home;Long Term: Improve cardiorespiratory fitness, muscular endurance and strength as measured by increased METs and functional capacity (6MWT)       Able to understand and use rate of perceived exertion (RPE) scale  Yes       Intervention  Provide education and explanation on how to use RPE scale       Expected Outcomes  Short Term: Able to use RPE daily in rehab to express subjective intensity level;Long Term:  Able to use RPE to guide intensity level when exercising independently       Able to understand and use Dyspnea scale  Yes       Intervention  Provide education and explanation on how to use Dyspnea scale       Expected Outcomes  Short Term: Able to use Dyspnea scale daily in rehab to express subjective sense of shortness of breath during exertion;Long Term: Able to use Dyspnea scale to guide intensity level when exercising independently       Knowledge and understanding of Target Heart Rate Range (THRR)  Yes        Intervention  Provide education and explanation of THRR including how the numbers were predicted and where they are located for reference       Expected Outcomes  Short Term: Able to state/look up THRR;Short Term: Able to use daily as guideline for intensity in rehab;Long Term: Able to use THRR to govern intensity when exercising independently       Understanding of Exercise Prescription  Yes       Intervention  Provide education, explanation, and written materials on patient's individual exercise prescription       Expected Outcomes  Short Term: Able to explain program exercise prescription;Long Term: Able to explain home exercise prescription to exercise independently          Exercise Goals Re-Evaluation : Exercise Goals Re-Evaluation    Row Name 01/26/18 1346 02/20/18 1055           Exercise Goal Re-Evaluation   Exercise Goals Review  -  Increase Physical Activity;Able to understand and use rate of perceived exertion (RPE) scale;Knowledge and understanding of Target Heart Rate Range (THRR);Understanding of Exercise Prescription;Increase Strength and Stamina;Able to understand and use Dyspnea scale      Comments  Patient's first day of exercise will be 01/29/18  Patient is currently only doing seated exercise due to her desaturations. Will increase her to 25 liters with NRB next time she is here to see if this will allow her to walk on the track while keeping her o2 sats above 90%      Expected Outcomes  -  Through exercise at rehab and at home, patient will increase physical capacity and be able to carry out ADL's with ease at home. Patient will also gain the confidence and knowledge to adhere to an exercise regime at home.         Discharge Exercise Prescription (Final Exercise Prescription Changes): Exercise Prescription Changes - 02/17/18 1231      Response to Exercise   Blood Pressure (Admit)  122/58    Blood Pressure (Exercise)  146/50    Blood Pressure (Exit)  128/58     Heart Rate (Admit)  74 bpm    Heart Rate (Exercise)  92 bpm    Heart Rate (Exit)  81 bpm    Oxygen Saturation (Admit)  98 %    Oxygen Saturation (Exercise)  89 %    Oxygen Saturation (Exit)  100 %    Rating of Perceived Exertion (Exercise)  13    Perceived Dyspnea (Exercise)  1    Duration  Progress to 45 minutes of aerobic exercise without signs/symptoms of physical distress    Intensity  Other (comment)      Progression   Progression  Continue to progress workloads to maintain intensity without signs/symptoms of physical distress. 40-80% HRR      Resistance Training   Training Prescription  Yes    Weight  orange bands    Reps  10-15    Time  10 Minutes      Oxygen   Oxygen  Continuous    Liters  15 L, 100% NRB      Recumbant Bike   Level  2.5    Watts  10    Minutes  17      NuStep   Level  4    SPM  80    Minutes  34    METs  2.4       Nutrition:  Target Goals: Understanding of nutrition guidelines, daily intake of sodium <1573m, cholesterol <2076m calories 30% from fat and 7% or less from saturated fats, daily to have 5 or more servings of fruits and vegetables.  Biometrics: Pre Biometrics - 01/12/18 1459      Pre Biometrics   Grip Strength  30 kg        Nutrition Therapy Plan and Nutrition Goals: Nutrition Therapy & Goals - 02/12/18 1224      Nutrition Therapy   Diet  Heart Healthy      Personal Nutrition Goals   Nutrition Goal  Describe the benefit of including fruits, vegetables, whole grains, and low-fat dairy products in a healthy meal plan.      Intervention Plan   Intervention  Prescribe, educate and counsel regarding individualized specific dietary modifications aiming towards targeted core components such as weight, hypertension, lipid management, diabetes, heart failure and other comorbidities.    Expected Outcomes  Short Term Goal: Understand basic principles of dietary content, such as calories, fat, sodium, cholesterol and nutrients.;Long  Term Goal: Adherence to prescribed nutrition plan.       Nutrition Assessments: Nutrition Assessments - 02/12/18 1224      Rate Your Plate Scores   Pre Score  58       Nutrition Goals Re-Evaluation:   Nutrition Goals Discharge (Final Nutrition Goals Re-Evaluation):   Psychosocial: Target Goals: Acknowledge presence or absence of significant depression and/or stress, maximize coping skills, provide positive support system. Participant is able to verbalize types and ability to use techniques and skills needed for reducing stress and depression.  Initial Review & Psychosocial Screening: Initial Psych Review & Screening - 01/12/18 1456      Initial Review   Current issues with  None Identified      Family Dynamics   Good Support System?  Yes      Barriers   Psychosocial barriers to participate in program  There are no identifiable barriers or psychosocial needs.      Screening Interventions   Interventions  Encouraged to exercise       Quality of Life Scores:  Scores of 19 and below usually indicate a poorer quality of life in these areas.  A difference of  2-3 points is a clinically meaningful difference.  A difference of 2-3 points in the total score of the Quality of Life Index has been associated with significant improvement in overall quality of life, self-image, physical symptoms, and general health in studies assessing change in quality of life.   PHQ-9: Recent Review Flowsheet Data    Depression screen [facility] 2/9 01/12/2018   Decreased Interest 0   Down, Depressed, Hopeless 0   PHQ - 2 Score 0     Interpretation of Total Score  Total Score Depression Severity:  1-4 = Minimal depression, 5-9 = Mild depression, 10-14 = Moderate depression, 15-19 = Moderately severe depression, 20-27 = Severe depression   Psychosocial Evaluation and Intervention: Psychosocial Evaluation - 01/12/18 1457      Psychosocial Evaluation & Interventions   Interventions  Encouraged to  exercise with the program and follow exercise prescription    Expected Outcomes  patient will remain free from psychosocial barriers to participation in pulmonary rehab    Continue Psychosocial Services   No Follow up required       Psychosocial Re-Evaluation: Psychosocial Re-Evaluation    [place] Name 02/23/18 1110             Psychosocial Re-Evaluation   Current issues with  None Identified       Comments  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    Expected Outcomes  patient will remain free from psychosocial barriers to participation in pulmonary rehab     Interventions  Encouraged to attend Pulmonary Rehabilitation for the exercise    Continue Psychosocial Services   Follow up required by staff       Education: Education Goals: Education classes will be provided on a weekly basis, covering required topics. Participant will state understanding/return demonstration of topics presented.  Learning Barriers/Preferences: Learning Barriers/Preferences - 01/12/18 1453      Learning Barriers/Preferences   Learning Barriers  None    Learning Preferences  Written Material;Group Instruction;Verbal Instruction;Skilled Demonstration       Education Topics: Risk Factor Reduction:  -Group instruction that is supported by a  PowerPoint presentation. Instructor  discusses the definition of a risk factor, different risk factors for pulmonary disease, and how the heart and lungs work together.     Nutrition for Pulmonary Patient:  -Group instruction provided by PowerPoint slides, verbal discussion, and written materials to support subject matter. The instructor gives an explanation and review of healthy diet recommendations, which includes a discussion on weight management, recommendations for fruit and vegetable consumption, as well as protein, fluid, caffeine, fiber, sodium, sugar, and alcohol. Tips for eating when patients are short of breath are discussed.   PULMONARY REHAB CHRONIC OBSTRUCTIVE PULMONARY DISEASE from 02/12/2018 in [place]  Date  02/05/18  Educator  Parke Simmers  Instruction Review Code  2- Demonstrated Understanding      Pursed Lip Breathing:  -Group instruction that is supported by demonstration and informational handouts. Instructor discusses the benefits of pursed lip and diaphragmatic breathing and detailed demonstration on how to preform both.     Oxygen Safety:  -Group instruction provided by PowerPoint, verbal discussion, and written material to support subject matter. There is an overview of "What is Oxygen" and "Why do we need it".  Instructor also reviews how to create a safe environment for oxygen use, the importance of using oxygen as prescribed, and the risks of noncompliance. There is a brief discussion on traveling with oxygen and resources the patient may utilize.   Oxygen Equipment:  -Group instruction provided by [facility] Staff utilizing handouts, written materials, and equipment demonstrations.   Signs and Symptoms:  -Group instruction provided by written material and verbal discussion to support subject matter. Warning signs and symptoms of infection, stroke, and heart attack are reviewed and when to call the physician/911 reinforced. Tips for  preventing the spread of infection discussed.   Advanced Directives:  -Group instruction provided by verbal instruction and written material to support subject matter. Instructor reviews Advanced Directive laws and proper instruction for filling out document.   Pulmonary Video:  -Group video education that reviews the importance of medication and oxygen compliance, exercise, good nutrition, pulmonary hygiene, and pursed lip and diaphragmatic breathing for the pulmonary patient.   Exercise for the Pulmonary Patient:  -Group instruction that is supported by a PowerPoint presentation. Instructor discusses benefits of exercise, core components of exercise, frequency, duration, and intensity of an exercise routine, importance of utilizing pulse oximetry during exercise, safety while exercising, and options of places to exercise outside of rehab.     Pulmonary Medications:  -Verbally interactive group education provided by instructor with focus on inhaled medications and proper administration.   PULMONARY REHAB CHRONIC OBSTRUCTIVE PULMONARY DISEASE from 02/12/2018 in [place]  Date  02/12/18  Educator  Pharm  Instruction Review Code  2- Demonstrated Understanding      Anatomy and Physiology of the Respiratory System and Intimacy:  -Group instruction provided by PowerPoint, verbal discussion, and written material to support subject matter. Instructor reviews respiratory cycle and anatomical components of the respiratory system and their functions. Instructor also reviews differences in obstructive and restrictive respiratory diseases with examples of each. Intimacy, Sex, and Sexuality differences are reviewed with a discussion on how relationships can change when diagnosed with pulmonary disease. Common sexual concerns are reviewed.   PULMONARY REHAB CHRONIC OBSTRUCTIVE PULMONARY DISEASE from 02/12/2018 in [place]  Date  01/29/18   Educator  rn  Instruction Review Code  2- Demonstrated Understanding      MD DAY -A group question and answer  session with a medical doctor that allows participants to ask questions that relate to their pulmonary disease state.   OTHER EDUCATION -Group or individual verbal, written, or video instructions that support the educational goals of the pulmonary rehab program.   Holiday Eating Survival Tips:  -Group instruction provided by PowerPoint slides, verbal discussion, and written materials to support subject matter. The instructor gives patients tips, tricks, and techniques to help them not only survive but enjoy the holidays despite the onslaught of food that accompanies the holidays.   Knowledge Questionnaire Score: Knowledge Questionnaire Score - 01/15/18 1746      Knowledge Questionnaire Score   Pre Score  13/18       Core Components/Risk Factors/Patient Goals at Admission: Personal Goals and Risk Factors at Admission - 01/12/18 1456      Core Components/Risk Factors/Patient Goals on Admission   Improve shortness of breath with ADL's  Yes    Intervention  Provide education, individualized exercise plan and daily activity instruction to help decrease symptoms of SOB with activities of daily living.    Expected Outcomes  Short Term: Improve cardiorespiratory fitness to achieve a reduction of symptoms when performing ADLs;Long Term: Be able to perform more ADLs without symptoms or delay the onset of symptoms       Core Components/Risk Factors/Patient Goals Review:  Goals and Risk Factor Review    Row Name 02/23/18 1105             Core Components/Risk Factors/Patient Goals Review   Personal Goals Review  Improve shortness of breath with ADL's       Review  patient got off to a slow start in pulmonary rehab related to her inability to stay oxygenated with the maximum amount of oxygen we could supply to her. in collaboration with her pulmonologist it was decided that  she would not walk during rehab related to continued desaturations below adjusted parameters on maximum flow. patient does tolerated seated exercise and increased workloads on equiptment. she has not seen an improvement in her shortness of breath as of yet. will continue to monitor and hope to see progression towards improvement in shortness of breath over the next 30 days.       Expected Outcomes  see admission expected outcomes.          Core Components/Risk Factors/Patient Goals at Discharge (Final Review):  Goals and Risk Factor Review - 02/23/18 1105      Core Components/Risk Factors/Patient Goals Review   Personal Goals Review  Improve shortness of breath with ADL's    Review  patient got off to a slow start in pulmonary rehab related to her inability to stay oxygenated with the maximum amount of oxygen we could supply to her. in collaboration with her pulmonologist it was decided that she would not walk during rehab related to continued desaturations below adjusted parameters on maximum flow. patient does tolerated seated exercise and increased workloads on equiptment. she has not seen an improvement in her shortness of breath as of yet. will continue to monitor and hope to see progression towards improvement in shortness of breath over the next 30 days.    Expected Outcomes  see admission expected outcomes.       ITP Comments:   Comments: patient has attended 6 sessions since admission

## 2018-02-24 ENCOUNTER — Other Ambulatory Visit: Payer: Self-pay | Admitting: *Deleted

## 2018-02-24 ENCOUNTER — Encounter (HOSPITAL_COMMUNITY)
Admission: RE | Admit: 2018-02-24 | Discharge: 2018-02-24 | Disposition: A | Discharge status: Home / Self Care | Payer: Medicare Other | Source: Ambulatory Visit | Attending: Pulmonary Disease | Admitting: Pulmonary Disease

## 2018-02-24 DIAGNOSIS — Z9889 Other specified postprocedural states: Secondary | ICD-10-CM | POA: Diagnosis not present

## 2018-02-24 DIAGNOSIS — Z79899 Other long term (current) drug therapy: Secondary | ICD-10-CM | POA: Diagnosis not present

## 2018-02-24 DIAGNOSIS — Z86010 Personal history of colonic polyps: Secondary | ICD-10-CM | POA: Diagnosis not present

## 2018-02-24 DIAGNOSIS — M1990 Unspecified osteoarthritis, unspecified site: Secondary | ICD-10-CM | POA: Diagnosis not present

## 2018-02-24 DIAGNOSIS — E0590 Thyrotoxicosis, unspecified without thyrotoxic crisis or storm: Secondary | ICD-10-CM | POA: Diagnosis not present

## 2018-02-24 DIAGNOSIS — R0902 Hypoxemia: Secondary | ICD-10-CM | POA: Diagnosis not present

## 2018-02-24 DIAGNOSIS — J449 Chronic obstructive pulmonary disease, unspecified: Secondary | ICD-10-CM

## 2018-02-24 MED ORDER — FUROSEMIDE 20 MG PO TABS: 20.0000 mg | ORAL_TABLET | Freq: Every day | ORAL | 1 refills | Status: DC

## 2018-02-24 NOTE — Progress Notes (Signed)
Daily Session Note  Patient Details  Name: [name] MRN: [id number] Date of Birth: March 03, 1929 Referring Provider:     Pulmonary Rehab Walk Test from 01/22/2018 in [place]  Referring Provider  Dr. Elsworth Soho      Encounter Date: 02/24/2018  Check In: Session Check In - 02/24/18 1219      Check-In   Location  [facility]    Staff Present  [name], RN, BSN;[name], MS, ACSM RCEP, Exercise Physiologist;Lattie Cervi Ysidro Evert, RN    Supervising physician immediately available to respond to emergencies  [facility] immediately available    Physician(s)  Dr. Lonny Prude    Medication changes reported      No    Fall or balance concerns reported     No    Tobacco Cessation  No Change    Warm-up and Cool-down  Performed as group-led instruction    Resistance Training Performed  Yes    VAD Patient?  No      Pain Assessment   Currently in Pain?  No/denies    Multiple Pain Sites  No       Capillary Blood Glucose: No results found for this or any previous visit (from the past 24 hour(s)).    Social History   Tobacco Use  Smoking Status Former Smoker  . Packs/day: 1.00  . Years: 40.00  . Pack years: 40.00  . Types: Cigarettes  . Last attempt to quit: 11/18/1993  . Years since quitting: 24.2  Smokeless Tobacco Never Used    Goals Met:  Exercise tolerated well No report of cardiac concerns or symptoms Strength training completed today  Goals Unmet:  Not Applicable  Comments: Service time is from 1030 to 1210    Dr. Rush Farmer is Medical Director for Pulmonary Rehab at [facility].

## 2018-02-25 ENCOUNTER — Encounter: Payer: Self-pay | Admitting: Internal Medicine

## 2018-02-25 DIAGNOSIS — C4441 Basal cell carcinoma of skin of scalp and neck: Secondary | ICD-10-CM | POA: Diagnosis not present

## 2018-02-26 ENCOUNTER — Telehealth: Payer: Self-pay | Admitting: Internal Medicine

## 2018-02-26 ENCOUNTER — Telehealth (HOSPITAL_COMMUNITY): Payer: Self-pay | Admitting: Internal Medicine

## 2018-02-26 ENCOUNTER — Encounter (HOSPITAL_COMMUNITY)
Admission: RE | Admit: 2018-02-26 | Discharge: 2018-02-26 | Disposition: A | Discharge status: Home / Self Care | Payer: Medicare Other | Source: Ambulatory Visit

## 2018-02-26 DIAGNOSIS — J9611 Chronic respiratory failure with hypoxia: Secondary | ICD-10-CM

## 2018-02-26 NOTE — Telephone Encounter (Signed)
Received ONO on RA from [facility] (done 02/25/18) Per pt- test was done on 2lpm  In this case MW rec that she increase her o2 to 4 lpm with sleep and then repeat the ONO on 4lpm  Order sent to [facility]

## 2018-02-26 NOTE — Progress Notes (Signed)
[name] 84 y.o. female  30 day Psychosocial Note  Patient psychosocial assessment reveals no barriers to participation in Pulmonary Rehab. Patient does continue to exhibit positive coping skills to deal with any psychosocial concerns that may arrise. She is offered emotional support and reassurance at each appointment. Patient does feel she is making some progress toward Pulmonary Rehab goals. She was initially limited to how hard she would push herself related to desaturations into the 70s on 100% NRB with ambulation. She was prescribed seated exercise pending a recent echo. At her last exercise session she was allowed to ambulate with her NRB mask on 25 liters and was able to maintain saturations greater than 85. She was very pleased with herself and stated "I think I am doing better". Patient reports her health and activity level has not really improved in the past 30 days however she has made significant improvements in understanding the importants of using enough oxygen to keep her saturations at least in the 80s. Patient reports continuing to feel positive about current and projected progression in Pulmonary Rehab. After reviewing the patient's treatment plan, the patient is making some progress toward Pulmonary Rehab goals. 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 She loves the social interactions with the visitors, patients, and fellow volunteers.Patient's rate of progress toward rehab goals is fair. She puts in great effort however she remains limited related to the severity of her pulmonary hypertension. Plan of action to help patient continue to work towards rehab goals include increasing workloads on equipment as tolerated in order to increase patients stamina and strength. Will continue to monitor and evaluate progress toward psychosocial goal(s).  Goal(s) in progress:  Help patient work toward returning to meaningful activities  that improve patient's QOL and are attainable with patient's lung disease

## 2018-03-03 ENCOUNTER — Encounter (HOSPITAL_COMMUNITY)
Admission: RE | Admit: 2018-03-03 | Discharge: 2018-03-03 | Disposition: A | Discharge status: Home / Self Care | Payer: Medicare Other | Source: Ambulatory Visit | Attending: Pulmonary Disease | Admitting: Pulmonary Disease

## 2018-03-03 VITALS — Wt 155.2 lb

## 2018-03-03 DIAGNOSIS — Z9889 Other specified postprocedural states: Secondary | ICD-10-CM | POA: Diagnosis not present

## 2018-03-03 DIAGNOSIS — J449 Chronic obstructive pulmonary disease, unspecified: Secondary | ICD-10-CM | POA: Diagnosis not present

## 2018-03-03 DIAGNOSIS — Z79899 Other long term (current) drug therapy: Secondary | ICD-10-CM | POA: Diagnosis not present

## 2018-03-03 DIAGNOSIS — E0590 Thyrotoxicosis, unspecified without thyrotoxic crisis or storm: Secondary | ICD-10-CM | POA: Diagnosis not present

## 2018-03-03 DIAGNOSIS — M1990 Unspecified osteoarthritis, unspecified site: Secondary | ICD-10-CM | POA: Diagnosis not present

## 2018-03-03 DIAGNOSIS — Z86010 Personal history of colonic polyps: Secondary | ICD-10-CM | POA: Diagnosis not present

## 2018-03-03 NOTE — Progress Notes (Signed)
[name] presents to pulmonary rehab for her bi-weekly exercise session. I have completed her thirty day face to face review and determined that [name] is on track for meeting their pulmonary rehab goals. There are no barriers identified that will prevent them from continuing their exercise in pulmonary rehab as prescribed.   Wesam G. Yacoub, M.D. [facility]. Pager: [phone]. After hours pager: [phone]. 

## 2018-03-03 NOTE — Progress Notes (Addendum)
Daily Session Note  Patient Details  Name: [name] MRN: [id number] Date of Birth: 02/10/1929 Referring Provider:     Pulmonary Rehab Walk Test from 01/22/2018 in [facility]  Referring Provider  Dr. Alva      Encounter Date: 03/03/2018  Check In: Session Check In - 03/03/18 1226      Check-In   Location  [facility]    Staff Present  [name], MS, ACSM RCEP, Exercise Physiologist;Lisa Hughes, RN;Portia Payne, RN, BSN;Carlette Carlton, RN, BSN    Supervising physician immediately available to respond to emergencies  [facility] immediately available    Physician(s)  Dr. Sheikh    Medication changes reported      No    Fall or balance concerns reported     No    Tobacco Cessation  No Change    Warm-up and Cool-down  Performed as group-led instruction    Resistance Training Performed  Yes    VAD Patient?  No      Pain Assessment   Currently in Pain?  No/denies    Multiple Pain Sites  No       Capillary Blood Glucose: No results found for this or any previous visit (from the past 24 hour(s)).  Exercise Prescription Changes - 03/03/18 1200      Response to Exercise   Blood Pressure (Admit)  136/62    Blood Pressure (Exercise)  138/70    Blood Pressure (Exit)  124/70    Heart Rate (Admit)  66 bpm    Heart Rate (Exercise)  87 bpm    Heart Rate (Exit)  61 bpm    Oxygen Saturation (Admit)  96 %    Oxygen Saturation (Exercise)  88 %    Oxygen Saturation (Exit)  97 %    Rating of Perceived Exertion (Exercise)  12    Perceived Dyspnea (Exercise)  0    Duration  Progress to 45 minutes of aerobic exercise without signs/symptoms of physical distress    Intensity  Other (comment)      Progression   Progression  Continue to progress workloads to maintain intensity without signs/symptoms of physical distress. 40-80% HRR      Resistance Training   Training Prescription  Yes    Weight  orange bands    Reps  10-15     Time  10 Minutes      Oxygen   Oxygen  Continuous    Liters  15 L, 100% NRB      NuStep   Level  4    SPM  80    Minutes  34    METs  2.2       Social History   Tobacco Use  Smoking Status Former Smoker  . Packs/day: 1.00  . Years: 40.00  . Pack years: 40.00  . Types: Cigarettes  . Last attempt to quit: 11/18/1993  . Years since quitting: 24.3  Smokeless Tobacco Never Used    Goals Met:  Exercise tolerated well No report of cardiac concerns or symptoms Strength training completed today  Goals Unmet:  Not Applicable  Comments: Service time is from 11:15a to 12:10p    Dr. Wesam G. Yacoub is Medical Director for Pulmonary Rehab at [facility]. 

## 2018-03-04 ENCOUNTER — Telehealth: Payer: Self-pay | Admitting: Pulmonary Disease

## 2018-03-04 NOTE — Telephone Encounter (Signed)
Received Staff Message from Dr. Elsworth Soho from [facility] as shown below:  Hi Dr. Elsworth Soho,  The above patient continues to attend pulmonary rehab and is such a pleasure to care for. Per Medicare requirements, the Medical Director Ellin Goodie) has to meet/assess the COPD patients with traditional medicare every 30 days.   Today, he met with the above patient and reviewed her medical records, latest echo, last CT, PFT etc. I informed him of her high oxygen oxygen requirements and of our in-basket communications regarding her care in pulmonary rehab. Maylon Cos was concerned that her exertional oxygen requirements did not meet her objective data. He was going to call you but remembered you worked last night. He thinks she needs a referral to the HF clinic. The patient is aware of this information and we need to do our due diligences with the referral or explain to her why she doesn't need to go. I think Maylon Cos also tried to call the referral but couldn't get thru. The patient has tried to make appointments to see you for follow up since the echo, fall from desaturation, ..... However she continues to be rescheduled because of you not being in the office (not to anyone's fault). Thank you for reviewing this matter and making sure the patient feels like she is receiving the best care possible, because we know she is.   Portia   Called pt to see if she could come in for an appt tomorrow, 4/18 with RA at 2pm. Pt stated 2pm would be fine. Scheduled an appt for pt with RA 03/05/18 at 2pm. Nothing further needed at this time.

## 2018-03-05 ENCOUNTER — Encounter (HOSPITAL_COMMUNITY)
Admission: RE | Admit: 2018-03-05 | Discharge: 2018-03-05 | Disposition: A | Discharge status: Home / Self Care | Payer: Medicare Other | Source: Ambulatory Visit | Attending: Pulmonary Disease | Admitting: Pulmonary Disease

## 2018-03-05 ENCOUNTER — Ambulatory Visit (INDEPENDENT_AMBULATORY_CARE_PROVIDER_SITE_OTHER): Payer: Medicare Other | Admitting: Pulmonary Disease

## 2018-03-05 ENCOUNTER — Encounter: Payer: Self-pay | Admitting: Pulmonary Disease

## 2018-03-05 DIAGNOSIS — I779 Disorder of arteries and arterioles, unspecified: Secondary | ICD-10-CM

## 2018-03-05 DIAGNOSIS — E0590 Thyrotoxicosis, unspecified without thyrotoxic crisis or storm: Secondary | ICD-10-CM | POA: Diagnosis not present

## 2018-03-05 DIAGNOSIS — J9611 Chronic respiratory failure with hypoxia: Secondary | ICD-10-CM

## 2018-03-05 DIAGNOSIS — Z79899 Other long term (current) drug therapy: Secondary | ICD-10-CM | POA: Diagnosis not present

## 2018-03-05 DIAGNOSIS — J449 Chronic obstructive pulmonary disease, unspecified: Secondary | ICD-10-CM | POA: Diagnosis not present

## 2018-03-05 DIAGNOSIS — M1990 Unspecified osteoarthritis, unspecified site: Secondary | ICD-10-CM | POA: Diagnosis not present

## 2018-03-05 DIAGNOSIS — Z9889 Other specified postprocedural states: Secondary | ICD-10-CM | POA: Diagnosis not present

## 2018-03-05 DIAGNOSIS — I272 Pulmonary hypertension, unspecified: Secondary | ICD-10-CM | POA: Diagnosis not present

## 2018-03-05 DIAGNOSIS — Z86010 Personal history of colonic polyps: Secondary | ICD-10-CM | POA: Diagnosis not present

## 2018-03-05 NOTE — Patient Instructions (Signed)
Keep using 6 L oxygen at rest. Okay to increase to 8 L oxygen while ambulating and 15 L during exercise.  Increase Lasix to 40 mg daily for 7 days and call back with weight loss and oxygen levels. Referral to advanced heart failure clinic  For opinion about pulmonary hypertension

## 2018-03-05 NOTE — Addendum Note (Signed)
Addended by: Valerie Salts on: 03/05/2018 03:56 PM   Modules accepted: Orders

## 2018-03-05 NOTE — Progress Notes (Signed)
Daily Session Note  Patient Details  Name: [name] MRN: [id number] Date of Birth: May 26, 1929 Referring Provider:     Pulmonary Rehab Walk Test from 01/22/2018 in [place]  Referring Provider  Dr. Elsworth Soho      Encounter Date: 03/05/2018  Check In: Session Check In - 03/05/18 1026      Check-In   Location  [facility]    Staff Present  Cloyde Reams Mataeo Ingwersen, MS, ACSM RCEP, Exercise Physiologist;Portia Rollene Rotunda, RN, Roque Cash, RN    Supervising physician immediately available to respond to emergencies  [facility] immediately available    Physician(s)  Dr. Cruzita Lederer    Medication changes reported      No    Fall or balance concerns reported     No    Tobacco Cessation  No Change    Warm-up and Cool-down  Performed as group-led instruction    Resistance Training Performed  Yes    VAD Patient?  No      Pain Assessment   Currently in Pain?  No/denies    Multiple Pain Sites  No       Capillary Blood Glucose: No results found for this or any previous visit (from the past 24 hour(s)).    Social History   Tobacco Use  Smoking Status Former Smoker  . Packs/day: 1.00  . Years: 40.00  . Pack years: 40.00  . Types: Cigarettes  . Last attempt to quit: 11/18/1993  . Years since quitting: 24.3  Smokeless Tobacco Never Used    Goals Met:  Exercise tolerated well No report of cardiac concerns or symptoms Strength training completed today  Goals Unmet:  Not Applicable  Comments: Service time is from 10:30a to 12:30p    Dr. Rush Farmer is Medical Director for Pulmonary Rehab at [facility].

## 2018-03-05 NOTE — Progress Notes (Signed)
   Subjective:    Patient ID: [name], female    DOB: [birth year], 84 y.o.   MRN: [id number]  HPI  84 yo former smoker followed for COPD on home O@ with severe pulmonary HTN  Hx of polycythemia followed by hematology  Has D CHF and CAD([name] )  She has been requiring increasing oxygen during rehab up to 15 L high flow nasal cannula.  I have received numerous messages from rehab nurses. She had an episode of syncope and so my partner Dr. Melvyn Novas.  Bubble study was ordered which showed a few bubbles crossing earlier on Valsalva maneuver consistent with right to left shunt  She states that 6 L of oxygen seem to suffice at rest but on ambulation around the house.  On routine activities she drops as low as 80%.  She remains asymptomatic when this happens There is mild pedal edema, she is compliant with 20 mg of Lasix, weight is at 155 pounds today.  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 PFO with R--> L shunt   Review of Systems neg for any significant sore throat, dysphagia, itching, sneezing, nasal congestion or excess/ purulent secretions, fever, chills, sweats, unintended wt loss, pleuritic or exertional cp, hempoptysis, orthopnea pnd or change in chronic leg swelling. Also denies presyncope, palpitations, heartburn, abdominal pain, nausea, vomiting, diarrhea or change in bowel or urinary habits, dysuria,hematuria, rash, arthralgias, visual complaints, headache, numbness weakness or ataxia.     Objective:   Physical Exam  Gen. Pleasant, elderly,well-nourished, in no distress ENT - no thrush, no post nasal drip Neck: No JVD, no thyromegaly, no  carotid bruits Lungs: no use of accessory muscles, no dullness to percussion, clear without rales or rhonchi  Cardiovascular: Rhythm regular, heart sounds  normal, no murmurs or gallops, 1+ peripheral edema Musculoskeletal: No deformities, no cyanosis or clubbing         Assessment & Plan:

## 2018-03-05 NOTE — Assessment & Plan Note (Signed)
Increase Lasix to 40 mg daily for 7 days and call back with weight loss and oxygen levels. Referral to advanced heart failure clinic  For opinion about pulmonary hypertension Not a candidate for vasodilator therapy in my opinion

## 2018-03-05 NOTE — Assessment & Plan Note (Signed)
Keep using 6 L oxygen at rest. Okay to increase to 8 L oxygen while ambulating and 15 L during exercise.

## 2018-03-10 ENCOUNTER — Encounter (HOSPITAL_COMMUNITY)
Admission: RE | Admit: 2018-03-10 | Discharge: 2018-03-10 | Disposition: A | Discharge status: Home / Self Care | Payer: Medicare Other | Source: Ambulatory Visit | Attending: Pulmonary Disease | Admitting: Pulmonary Disease

## 2018-03-10 ENCOUNTER — Telehealth (HOSPITAL_COMMUNITY): Payer: Self-pay | Admitting: Internal Medicine

## 2018-03-10 DIAGNOSIS — Z9889 Other specified postprocedural states: Secondary | ICD-10-CM | POA: Diagnosis not present

## 2018-03-10 DIAGNOSIS — Z86010 Personal history of colonic polyps: Secondary | ICD-10-CM | POA: Diagnosis not present

## 2018-03-10 DIAGNOSIS — Z79899 Other long term (current) drug therapy: Secondary | ICD-10-CM | POA: Diagnosis not present

## 2018-03-10 DIAGNOSIS — J449 Chronic obstructive pulmonary disease, unspecified: Secondary | ICD-10-CM

## 2018-03-10 DIAGNOSIS — M1990 Unspecified osteoarthritis, unspecified site: Secondary | ICD-10-CM | POA: Diagnosis not present

## 2018-03-10 DIAGNOSIS — E0590 Thyrotoxicosis, unspecified without thyrotoxic crisis or storm: Secondary | ICD-10-CM | POA: Diagnosis not present

## 2018-03-10 NOTE — Progress Notes (Signed)
Daily Session Note  Patient Details  Name: [name] MRN: [id number] Date of Birth: [birth year] Referring Provider:     Pulmonary Rehab Walk Test from 01/22/2018 in [place]  Referring Provider  Dr. Elsworth Soho      Encounter Date: 03/10/2018  Check In: Session Check In - 03/10/18 1209      Check-In   Location  [facility]    Staff Present  Maurice Small, RN, BSN;Lisa Hughes, RN;Rasheed Welty, MS, ACSM RCEP, Exercise Physiologist    Supervising physician immediately available to respond to emergencies  [facility] immediately available    Physician(s)  Dr. Maylene Roes    Medication changes reported      No    Fall or balance concerns reported     No    Tobacco Cessation  No Change    Warm-up and Cool-down  Performed as group-led instruction    Resistance Training Performed  Yes    VAD Patient?  No      Pain Assessment   Multiple Pain Sites  No       Capillary Blood Glucose: No results found for this or any previous visit (from the past 24 hour(s)).    Social History   Tobacco Use  Smoking Status Former Smoker  . Packs/day: 1.00  . Years: 40.00  . Pack years: 40.00  . Types: Cigarettes  . Last attempt to quit: 11/18/1993  . Years since quitting: 24.3  Smokeless Tobacco Never Used    Goals Met:  Exercise tolerated well No report of cardiac concerns or symptoms Strength training completed today  Goals Unmet:  Not Applicable  Comments: Service time is from 10:30a to 12:05p    Dr. Rush Farmer is Medical Director for Pulmonary Rehab at [facility].

## 2018-03-10 NOTE — Telephone Encounter (Signed)
Left message for patient to call back.  Need to give her New Pulm HTN appt info.

## 2018-03-11 DIAGNOSIS — C44319 Basal cell carcinoma of skin of other parts of face: Secondary | ICD-10-CM | POA: Diagnosis not present

## 2018-03-12 ENCOUNTER — Encounter (HOSPITAL_COMMUNITY): Payer: Medicare Other

## 2018-03-17 ENCOUNTER — Encounter (HOSPITAL_COMMUNITY)
Admission: RE | Admit: 2018-03-17 | Discharge: 2018-03-17 | Disposition: A | Discharge status: Home / Self Care | Payer: Medicare Other | Source: Ambulatory Visit | Attending: Pulmonary Disease | Admitting: Pulmonary Disease

## 2018-03-17 ENCOUNTER — Telehealth: Payer: Self-pay | Admitting: Pulmonary Disease

## 2018-03-17 DIAGNOSIS — J449 Chronic obstructive pulmonary disease, unspecified: Secondary | ICD-10-CM

## 2018-03-17 NOTE — Telephone Encounter (Signed)
Called and spoke with patient, she states that RA advised her to take two pills each day. Her weight has not moved it has stayed between 152.2-154.8. Patient has no swelling.   RA please advise, thanks.

## 2018-03-17 NOTE — Progress Notes (Addendum)
Incomplete Session Note  Patient Details  Name: [name] MRN: [id number] Date of Birth: [birth year] Referring Provider:     Pulmonary Rehab Walk Test from 01/22/2018 in [place]  Referring Provider  Dr. Alene Mires [name] did not complete her rehab session. [name] presented late to her pulmonary rehab appointment complaining of epistaxis this am. She states this is the second time in 4 days that she has had a "bloody nose that has been hard to stop". She admits to her oxygen humidification running out in the middle of the night. The last episode occurred on Saturday and she was able to control bleeding with pressure to the bridge of the nose. This am she states she had to "blow my nose" and after blowing clear thin mucus, "my nose begin to poor blood". After applying pressure for some time, the bleeding stopped. When she presented to [facility] it was noted that patients nose began to bleed again. Patient complained of bleeding down the back of her throat as well as out of the right nostril. Pressure was applied for approx 30 min. Patient was not allowed to exercise. After speaking with the patient it was confirmed that patient HAD NOT contacted her PCP with recent nose bleeds as RN had believed. She has discontinued her ASA related to her recent skin ca surgery. BP 160/80. Patient admits to using Afrin as prescribed " like I did last year when I had nose bleeds". 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 Patient was discharged from [facility] in stable condition without nose bleed and taken to [facility]	[place]. Patient also instructed to seeking emergency medical treatment if nose bleed reoccurs and she is unable to get it to stop.

## 2018-03-18 DIAGNOSIS — R040 Epistaxis: Secondary | ICD-10-CM | POA: Diagnosis not present

## 2018-03-18 NOTE — Telephone Encounter (Signed)
Spoke with pt, aware of recs.  Pt advised that there has been no difference in her O2 levels since doubling her diuretic.    Forwarding back to RA as FYI.

## 2018-03-18 NOTE — Telephone Encounter (Signed)
Go back to taking 1 pill daily.  Did her oxygen level improve?

## 2018-03-19 ENCOUNTER — Encounter (HOSPITAL_COMMUNITY)
Admission: RE | Admit: 2018-03-19 | Discharge: 2018-03-19 | Disposition: A | Discharge status: Home / Self Care | Payer: Medicare Other | Source: Ambulatory Visit | Attending: Pulmonary Disease | Admitting: Pulmonary Disease

## 2018-03-19 DIAGNOSIS — E785 Hyperlipidemia, unspecified: Secondary | ICD-10-CM | POA: Insufficient documentation

## 2018-03-19 DIAGNOSIS — J8410 Pulmonary fibrosis, unspecified: Secondary | ICD-10-CM | POA: Insufficient documentation

## 2018-03-19 DIAGNOSIS — Z9889 Other specified postprocedural states: Secondary | ICD-10-CM | POA: Insufficient documentation

## 2018-03-19 DIAGNOSIS — Z86010 Personal history of colonic polyps: Secondary | ICD-10-CM | POA: Insufficient documentation

## 2018-03-19 DIAGNOSIS — J449 Chronic obstructive pulmonary disease, unspecified: Secondary | ICD-10-CM

## 2018-03-19 DIAGNOSIS — I440 Atrioventricular block, first degree: Secondary | ICD-10-CM | POA: Insufficient documentation

## 2018-03-19 DIAGNOSIS — R0602 Shortness of breath: Secondary | ICD-10-CM | POA: Insufficient documentation

## 2018-03-19 DIAGNOSIS — Z79899 Other long term (current) drug therapy: Secondary | ICD-10-CM | POA: Insufficient documentation

## 2018-03-19 DIAGNOSIS — M1990 Unspecified osteoarthritis, unspecified site: Secondary | ICD-10-CM | POA: Insufficient documentation

## 2018-03-19 DIAGNOSIS — I739 Peripheral vascular disease, unspecified: Secondary | ICD-10-CM | POA: Insufficient documentation

## 2018-03-19 DIAGNOSIS — I5032 Chronic diastolic (congestive) heart failure: Secondary | ICD-10-CM | POA: Insufficient documentation

## 2018-03-19 DIAGNOSIS — Z87891 Personal history of nicotine dependence: Secondary | ICD-10-CM | POA: Insufficient documentation

## 2018-03-19 DIAGNOSIS — I110 Hypertensive heart disease with heart failure: Secondary | ICD-10-CM | POA: Insufficient documentation

## 2018-03-19 DIAGNOSIS — E0590 Thyrotoxicosis, unspecified without thyrotoxic crisis or storm: Secondary | ICD-10-CM | POA: Insufficient documentation

## 2018-03-19 NOTE — Progress Notes (Signed)
Daily Session Note  Patient Details  Name: [name] MRN: [id number] Date of Birth: 1929-02-16 Referring Provider:     Pulmonary Rehab Walk Test from 01/22/2018 in [place]  Referring Provider  Dr. Elsworth Soho      Encounter Date: 03/19/2018  Check In: Session Check In - 03/19/18 1103      Check-In   Location  [facility]    Staff Present  Rodney Langton, RN;[name], MS, ACSM RCEP, Exercise Physiologist;Portia Rollene Rotunda, RN, BSN;Other       Capillary Blood Glucose: No results found for this or any previous visit (from the past 24 hour(s)).    Social History   Tobacco Use  Smoking Status Former Smoker  . Packs/day: 1.00  . Years: 40.00  . Pack years: 40.00  . Types: Cigarettes  . Last attempt to quit: 11/18/1993  . Years since quitting: 24.3  Smokeless Tobacco Never Used    Goals Met:  Exercise tolerated well No report of cardiac concerns or symptoms Strength training completed today  Goals Unmet:  Not Applicable  Comments: Service time is from 1030 to 1230    Dr. Rush Farmer is Medical Director for Pulmonary Rehab at [facility].

## 2018-03-23 DIAGNOSIS — J449 Chronic obstructive pulmonary disease, unspecified: Secondary | ICD-10-CM | POA: Diagnosis not present

## 2018-03-23 DIAGNOSIS — R0902 Hypoxemia: Secondary | ICD-10-CM | POA: Diagnosis not present

## 2018-03-24 ENCOUNTER — Telehealth: Payer: Self-pay | Admitting: Pulmonary Disease

## 2018-03-24 ENCOUNTER — Encounter (HOSPITAL_COMMUNITY)
Admission: RE | Admit: 2018-03-24 | Discharge: 2018-03-24 | Disposition: A | Discharge status: Home / Self Care | Payer: Medicare Other | Source: Ambulatory Visit | Attending: Pulmonary Disease | Admitting: Pulmonary Disease

## 2018-03-24 DIAGNOSIS — Z79899 Other long term (current) drug therapy: Secondary | ICD-10-CM | POA: Diagnosis not present

## 2018-03-24 DIAGNOSIS — M1990 Unspecified osteoarthritis, unspecified site: Secondary | ICD-10-CM | POA: Diagnosis not present

## 2018-03-24 DIAGNOSIS — Z9889 Other specified postprocedural states: Secondary | ICD-10-CM | POA: Diagnosis not present

## 2018-03-24 DIAGNOSIS — I5032 Chronic diastolic (congestive) heart failure: Secondary | ICD-10-CM | POA: Diagnosis not present

## 2018-03-24 DIAGNOSIS — Z87891 Personal history of nicotine dependence: Secondary | ICD-10-CM | POA: Diagnosis not present

## 2018-03-24 DIAGNOSIS — E0590 Thyrotoxicosis, unspecified without thyrotoxic crisis or storm: Secondary | ICD-10-CM | POA: Diagnosis not present

## 2018-03-24 DIAGNOSIS — E785 Hyperlipidemia, unspecified: Secondary | ICD-10-CM | POA: Diagnosis not present

## 2018-03-24 DIAGNOSIS — I440 Atrioventricular block, first degree: Secondary | ICD-10-CM | POA: Diagnosis not present

## 2018-03-24 DIAGNOSIS — J449 Chronic obstructive pulmonary disease, unspecified: Secondary | ICD-10-CM | POA: Diagnosis not present

## 2018-03-24 DIAGNOSIS — R0602 Shortness of breath: Secondary | ICD-10-CM | POA: Diagnosis not present

## 2018-03-24 DIAGNOSIS — I739 Peripheral vascular disease, unspecified: Secondary | ICD-10-CM | POA: Diagnosis not present

## 2018-03-24 DIAGNOSIS — J8410 Pulmonary fibrosis, unspecified: Secondary | ICD-10-CM | POA: Diagnosis not present

## 2018-03-24 DIAGNOSIS — I110 Hypertensive heart disease with heart failure: Secondary | ICD-10-CM | POA: Diagnosis not present

## 2018-03-24 DIAGNOSIS — Z86010 Personal history of colonic polyps: Secondary | ICD-10-CM | POA: Diagnosis not present

## 2018-03-24 NOTE — Telephone Encounter (Signed)
No call back is needed.

## 2018-03-24 NOTE — Telephone Encounter (Signed)
Per Pt, she completed the ONO last night, 5/6. Cb is [phone]

## 2018-03-24 NOTE — Progress Notes (Signed)
Daily Session Note  Patient Details  Name: [name] MRN: [id number] Date of Birth: November 21, 1928 Referring Provider:     Pulmonary Rehab Walk Test from 01/22/2018 in [place]  Referring Provider  Dr. Elsworth Soho      Encounter Date: 03/24/2018  Check In: Session Check In - 03/24/18 1214      Check-In   Location  [facility]    Staff Present  Rodney Langton, RN;[name], MS, ACSM RCEP, Exercise Physiologist;Keymora Grillot Rollene Rotunda, RN, BSN;Carlette Wilber Oliphant, RN, BSN    Supervising physician immediately available to respond to emergencies  [facility] immediately available    Physician(s)  Dr. Reesa Chew    Medication changes reported      No    Fall or balance concerns reported     No    Tobacco Cessation  No Change    Warm-up and Cool-down  Performed as group-led instruction    Resistance Training Performed  Yes    VAD Patient?  No      Pain Assessment   Currently in Pain?  No/denies    Multiple Pain Sites  No       Capillary Blood Glucose: No results found for this or any previous visit (from the past 24 hour(s)).    Social History   Tobacco Use  Smoking Status Former Smoker  . Packs/day: 1.00  . Years: 40.00  . Pack years: 40.00  . Types: Cigarettes  . Last attempt to quit: 11/18/1993  . Years since quitting: 24.3  Smokeless Tobacco Never Used    Goals Met:  Improved SOB with ADL's Using PLB without cueing & demonstrates good technique Exercise tolerated well No report of cardiac concerns or symptoms Strength training completed today  Goals Unmet:  Not Applicable  Comments: Service time is from 1030 to 1200   Dr. Rush Farmer is Medical Director for Pulmonary Rehab at [facility].

## 2018-03-24 NOTE — Progress Notes (Signed)
[name] presents to pulmonary rehab for her bi-weekly exercise session. I have completed her thirty day face to face review and determined that [name] is on track for meeting their pulmonary rehab goals. There are no barriers identified that will prevent them from continuing their exercise in pulmonary rehab as prescribed.   Amillya Chavira G. Isao Seltzer, M.D. [facility]. Pager: [phone]. After hours pager: [phone]. 

## 2018-03-24 NOTE — Telephone Encounter (Signed)
Noted in chart.   Called patient, unable to reach left message to give [place] a call back.

## 2018-03-24 NOTE — Telephone Encounter (Signed)
Called patient unable to reach left message to give us a call back.

## 2018-03-25 NOTE — Telephone Encounter (Signed)
Spoke with Melissa and advised her that pt had an ONO according to the notes. SHe stated she would call me back, she will have to go into the system and check. Will await return call.

## 2018-03-25 NOTE — Telephone Encounter (Signed)
[name] [facility] is calling back [phone]

## 2018-03-25 NOTE — Telephone Encounter (Signed)
Melissa confirmed ONO was done and states that the results should be faxed over soon  Nothing further needed

## 2018-03-26 ENCOUNTER — Encounter (HOSPITAL_COMMUNITY)
Admission: RE | Admit: 2018-03-26 | Discharge: 2018-03-26 | Disposition: A | Discharge status: Home / Self Care | Payer: Medicare Other | Source: Ambulatory Visit | Attending: Pulmonary Disease | Admitting: Pulmonary Disease

## 2018-03-26 DIAGNOSIS — J449 Chronic obstructive pulmonary disease, unspecified: Secondary | ICD-10-CM | POA: Diagnosis not present

## 2018-03-26 DIAGNOSIS — Z79899 Other long term (current) drug therapy: Secondary | ICD-10-CM | POA: Diagnosis not present

## 2018-03-26 DIAGNOSIS — Z9889 Other specified postprocedural states: Secondary | ICD-10-CM | POA: Diagnosis not present

## 2018-03-26 DIAGNOSIS — M1990 Unspecified osteoarthritis, unspecified site: Secondary | ICD-10-CM | POA: Diagnosis not present

## 2018-03-26 DIAGNOSIS — Z86010 Personal history of colonic polyps: Secondary | ICD-10-CM | POA: Diagnosis not present

## 2018-03-26 DIAGNOSIS — E0590 Thyrotoxicosis, unspecified without thyrotoxic crisis or storm: Secondary | ICD-10-CM | POA: Diagnosis not present

## 2018-03-26 NOTE — Progress Notes (Signed)
Daily Session Note  Patient Details  Name: [name] MRN: [id number] Date of Birth: 22-Dec-1928 Referring Provider:     Pulmonary Rehab Walk Test from 01/22/2018 in [place]  Referring Provider  Dr. Elsworth Soho      Encounter Date: 03/26/2018  Check In: Session Check In - 03/26/18 1030      Check-In   Location  [facility]    Staff Present  Rodney Langton, RN;[name], MS, ACSM RCEP, Exercise Physiologist;Jeryn Cerney Rollene Rotunda, RN, BSN;Carlette Wilber Oliphant, RN, BSN    Supervising physician immediately available to respond to emergencies  [facility] immediately available    Physician(s)  Dr. Tawanna Solo    Medication changes reported      No    Fall or balance concerns reported     No    Tobacco Cessation  No Change    Warm-up and Cool-down  Performed as group-led instruction    Resistance Training Performed  Yes    VAD Patient?  No      Pain Assessment   Currently in Pain?  No/denies    Multiple Pain Sites  No       Capillary Blood Glucose: No results found for this or any previous visit (from the past 24 hour(s)).    Social History   Tobacco Use  Smoking Status Former Smoker  . Packs/day: 1.00  . Years: 40.00  . Pack years: 40.00  . Types: Cigarettes  . Last attempt to quit: 11/18/1993  . Years since quitting: 24.3  Smokeless Tobacco Never Used    Goals Met:  Improved SOB with ADL's Using PLB without cueing & demonstrates good technique Exercise tolerated well No report of cardiac concerns or symptoms Strength training completed today  Goals Unmet:  Not Applicable  Comments: Service time is from 1030 to 1210   Dr. Rush Farmer is Medical Director for Pulmonary Rehab at [facility].

## 2018-03-26 NOTE — Progress Notes (Signed)
Pulmonary Individual Treatment Plan  Patient Details  Name: [name] MRN: [id number] Date of Birth: Jul 12, 1929 Referring Provider:     Pulmonary Rehab Walk Test from 01/22/2018 in [place]  Referring Provider  Dr. Elsworth Soho      Initial Encounter Date:    Pulmonary Rehab Walk Test from 01/22/2018 in [place]  Date  01/22/18  Referring Provider  Dr. Elsworth Soho      Visit Diagnosis: Chronic obstructive pulmonary disease, unspecified COPD type ([place])  Patient's Home Medications on Admission:   Current Outpatient Medications:  .  acetaminophen (TYLENOL) 500 MG tablet, Take 1,000 mg by mouth at bedtime. , Disp: , Rfl:  .  albuterol (PROVENTIL HFA;VENTOLIN HFA) 108 (90 Base) MCG/ACT inhaler, Inhale 2 puffs into the lungs every 6 (six) hours as needed for wheezing or shortness of breath., Disp: 1 Inhaler, Rfl: 1 .  Aloe-Sodium Chloride (AYR SALINE NASAL GEL NA), Place 1 application into the nose daily as needed (dryness)., Disp: , Rfl:  .  ANORO ELLIPTA 62.5-25 MCG/INH AEPB, INHALE 1 PUFF INTO THE LUNGS DAILY, Disp: 60 each, Rfl: 3 .  aspirin 81 MG tablet, Take 81 mg by mouth daily.  , Disp: , Rfl:  .  Carboxymethylcellul-Glycerin (LUBRICATING EYE DROPS OP), Apply 1 drop to eye daily as needed (dry eyes)., Disp: , Rfl:  .  Cyanocobalamin (B-12) 500 MCG TABS, Take 500 mcg by mouth daily., Disp: , Rfl:  .  denosumab (PROLIA) 60 MG/ML SOLN injection, Inject 60 mg into the skin every 6 (six) months. Administer in upper arm, thigh, or abdomen, Disp: , Rfl:  .  furosemide (LASIX) 20 MG tablet, Take 1 tablet (20 mg total) by mouth daily., Disp: 90 tablet, Rfl: 1 .  isosorbide mononitrate (IMDUR) 30 MG 24 hr tablet, TAKE 1 TABLET(30 MG) BY MOUTH DAILY, Disp: 90 tablet, Rfl: 2 .  levothyroxine (SYNTHROID, LEVOTHROID) 137 MCG tablet, Take 137 mcg by mouth daily.  , Disp: , Rfl:  .  losartan (COZAAR) 25 MG tablet, Take 1 tablet (25 mg total) by  mouth daily., Disp: 30 tablet, Rfl: 6 .  metoprolol tartrate (LOPRESSOR) 50 MG tablet, Take 1 tablet (50 mg total) by mouth 2 (two) times daily., Disp: 180 tablet, Rfl: 3 .  multivitamin-lutein (OCUVITE-LUTEIN) CAPS capsule, Take 1 capsule by mouth daily., Disp: , Rfl:  .  nitroGLYCERIN (NITROSTAT) 0.4 MG SL tablet, Place 0.4 mg under the tongue every 5 (five) minutes as needed for chest pain (x 3 pills)., Disp: , Rfl:  .  OXYGEN, 4 lpm with sleep and 6lpm all other times, Disp: , Rfl:  .  rosuvastatin (CRESTOR) 40 MG tablet, Take 20 mg by mouth at bedtime. , Disp: , Rfl:   Past Medical History: Past Medical History:  Diagnosis Date  . Arthritis   . B12 deficiency   . Basal cell cancer    Nose  . Carotid artery occlusion   . Cataract   . Chronic diastolic CHF (congestive heart failure) ([place])   . Colon polyps   . COPD (chronic obstructive pulmonary disease) ([place])    STAGE 2  . Coronary artery disease    status post angioplasty of the mid RCA in 1995  . Dyspnea   . First degree atrioventricular block   . History of obesity   . Hyperlipidemia   . Hypertension    well controlled  . Hyperthyroidism   . Lichen sclerosus et atrophicus of the vulva   .  Osteopenia   . PAD (peripheral artery disease) ([facility])    with stable claudication  . Pulmonary fibrosis ([facility])    wears 4L home O2 at baseline    Tobacco Use: Social History   Tobacco Use  Smoking Status Former Smoker  . Packs/day: 1.00  . Years: 40.00  . Pack years: 40.00  . Types: Cigarettes  . Last attempt to quit: 11/18/1993  . Years since quitting: 24.3  Smokeless Tobacco Never Used    Labs: Recent Chemical engineer    Labs for ITP Cardiac and Pulmonary Rehab Latest Ref Rng & Units 05/27/2008 12/28/2016   Hemoglobin A1c 4.8 - 5.6 % - 5.0   TCO2 - 23 -      Capillary Blood Glucose: Lab Results  Component Value Date   GLUCAP 96 12/30/2016   GLUCAP 97 12/30/2016   GLUCAP 132 (H) 12/29/2016   GLUCAP 140 (H)  12/29/2016   GLUCAP 123 (H) 12/29/2016     Pulmonary Assessment Scores: Pulmonary Assessment Scores    Row Name 01/15/18 1746 01/23/18 0704       ADL UCSD   ADL Phase  Entry  Entry    SOB Score total  49  -      CAT Score   CAT Score  20 Entry  -      mMRC Score   mMRC Score  -  0       Pulmonary Function Assessment: Pulmonary Function Assessment - 01/12/18 1453      Breath   Bilateral Breath Sounds  Clear    Shortness of Breath  Limiting activity;Yes       Exercise Target Goals:    Exercise Program Goal: Individual exercise prescription set using results from initial 6 min walk test and THRR while considering  patient's activity barriers and safety.    Exercise Prescription Goal: Initial exercise prescription builds to 30-45 minutes a day of aerobic activity, 2-3 days per week.  Home exercise guidelines will be given to patient during program as part of exercise prescription that the participant will acknowledge.  Activity Barriers & Risk Stratification: Activity Barriers & Cardiac Risk Stratification - 01/12/18 1436      Activity Barriers & Cardiac Risk Stratification   Activity Barriers  Shortness of Breath;Deconditioning       6 Minute Walk: 6 Minute Walk    Row Name 01/23/18 0700         6 Minute Walk   Phase  Initial     Distance  800 feet     Walk Time  - 5 minutes 10 seconds     # of Rest Breaks  1 stopped by ep due to low sat     MPH  1.51     METS  2.15     RPE  13     Perceived Dyspnea   2     Symptoms  Yes (comment)     Comments  used wheelchair and forehead probe-very slow o2 recovery     Resting HR  68 bpm     Resting BP  162/78     Resting Oxygen Saturation   91 %     Exercise Oxygen Saturation  during 6 min walk  78 %     Max Ex. HR  137 bpm     Max Ex. BP  180/64       Interval HR   1 Minute HR  100     2 Minute HR  118     3 Minute HR  130     4 Minute HR  132     5 Minute HR  132     6 Minute HR  137     2 Minute Post  HR  110     Interval Heart Rate?  Yes       Interval Oxygen   Interval Oxygen?  Yes     Baseline Oxygen Saturation %  91 %     1 Minute Oxygen Saturation %  95 %     1 Minute Liters of Oxygen  10 L     2 Minute Oxygen Saturation %  84 %     2 Minute Liters of Oxygen  10 L     3 Minute Oxygen Saturation %  81 %     3 Minute Liters of Oxygen  15 L     4 Minute Oxygen Saturation %  85 % rest break by EP     4 Minute Liters of Oxygen  15 L     5 Minute Oxygen Saturation %  85 %     5 Minute Liters of Oxygen  15 L     6 Minute Oxygen Saturation %  78 %     6 Minute Liters of Oxygen  15 L     2 Minute Post Oxygen Saturation %  82 % did not recover to 91% until 4 minutes post 40mt     2 Minute Post Liters of Oxygen  10 L        Oxygen Initial Assessment: Oxygen Initial Assessment - 01/23/18 0703      Initial 6 min Walk   Oxygen Used  Continuous;E-Tanks    Liters per minute  15      Program Oxygen Prescription   Program Oxygen Prescription  Continuous;E-Tanks    Liters per minute  -- 10-15    Comments  Will get guidlines from Dr. AElsworth Sohofor O2 Sat and liter flow       Oxygen Re-Evaluation: Oxygen Re-Evaluation    Row Name 01/26/18 1346 02/20/18 1045 03/23/18 1653         Program Oxygen Prescription   Program Oxygen Prescription  Continuous;E-Tanks  Continuous;E-Tanks  Continuous;E-Tanks     Liters per minute  -  15 NRB  15 NRB     Comments  -  Patient needs to stay above 90%--will try 25 liters  -       Home Oxygen   Home Oxygen Device  -  Portable Concentrator;Home Concentrator;E-Tanks  Portable Concentrator;Home Concentrator;E-Tanks     Sleep Oxygen Prescription  -  Continuous  Continuous     Liters per minute  -  6  6     Home Exercise Oxygen Prescription  -  Continuous Exercise at home is not encouraged at this time.  None Not exercising at home due to high liter flow needs     Liters per minute  -  15  -     Home at Rest Exercise Oxygen Prescription  -  Continuous   Continuous     Liters per minute  -  6  6     Compliance with Home Oxygen Use  -  Yes  Yes       Goals/Expected Outcomes   Short Term Goals  -  To learn and exhibit compliance with exercise, home and travel O2 prescription;To learn and understand importance of monitoring SPO2 with pulse  oximeter and demonstrate accurate use of the pulse oximeter.;To learn and understand importance of maintaining oxygen saturations>88%;To learn and demonstrate proper pursed lip breathing techniques or other breathing techniques.;To learn and demonstrate proper use of respiratory medications  To learn and exhibit compliance with exercise, home and travel O2 prescription;To learn and understand importance of monitoring SPO2 with pulse oximeter and demonstrate accurate use of the pulse oximeter.;To learn and understand importance of maintaining oxygen saturations>88%;To learn and demonstrate proper pursed lip breathing techniques or other breathing techniques.;To learn and demonstrate proper use of respiratory medications     Long  Term Goals  -  Exhibits compliance with exercise, home and travel O2 prescription;Verbalizes importance of monitoring SPO2 with pulse oximeter and return demonstration;Maintenance of O2 saturations>88%;Exhibits proper breathing techniques, such as pursed lip breathing or other method taught during program session;Compliance with respiratory medication;Demonstrates proper use of MDI's  Exhibits compliance with exercise, home and travel O2 prescription;Verbalizes importance of monitoring SPO2 with pulse oximeter and return demonstration;Maintenance of O2 saturations>88%;Exhibits proper breathing techniques, such as pursed lip breathing or other method taught during program session;Compliance with respiratory medication;Demonstrates proper use of MDI's     Goals/Expected Outcomes  -  compliance  Patient to maintain compliance        Oxygen Discharge (Final Oxygen Re-Evaluation): Oxygen  Re-Evaluation - 03/23/18 1653      Program Oxygen Prescription   Program Oxygen Prescription  Continuous;E-Tanks    Liters per minute  15 NRB      Home Oxygen   Home Oxygen Device  Portable Concentrator;Home Concentrator;E-Tanks    Sleep Oxygen Prescription  Continuous    Liters per minute  6    Home Exercise Oxygen Prescription  None Not exercising at home due to high liter flow needs    Home at Rest Exercise Oxygen Prescription  Continuous    Liters per minute  6    Compliance with Home Oxygen Use  Yes      Goals/Expected Outcomes   Short Term Goals  To learn and exhibit compliance with exercise, home and travel O2 prescription;To learn and understand importance of monitoring SPO2 with pulse oximeter and demonstrate accurate use of the pulse oximeter.;To learn and understand importance of maintaining oxygen saturations>88%;To learn and demonstrate proper pursed lip breathing techniques or other breathing techniques.;To learn and demonstrate proper use of respiratory medications    Long  Term Goals  Exhibits compliance with exercise, home and travel O2 prescription;Verbalizes importance of monitoring SPO2 with pulse oximeter and return demonstration;Maintenance of O2 saturations>88%;Exhibits proper breathing techniques, such as pursed lip breathing or other method taught during program session;Compliance with respiratory medication;Demonstrates proper use of MDI's    Goals/Expected Outcomes  Patient to maintain compliance       Initial Exercise Prescription: Initial Exercise Prescription - 01/23/18 0700      Date of Initial Exercise RX and Referring Provider   Date  01/22/18    Referring Provider  Dr. Elsworth Soho      Oxygen   Oxygen  Continuous    Liters  -- 10-15      Recumbant Bike   Level  2    Watts  10    Minutes  17      NuStep   Level  2    SPM  80    Minutes  17    METs  1.5      Track   Laps  5    Minutes  17  Prescription Details   Frequency (times per  week)  2    Duration  Progress to 45 minutes of aerobic exercise without signs/symptoms of physical distress      Intensity   THRR 40-80% of Max Heartrate  54-107    Ratings of Perceived Exertion  11-13    Perceived Dyspnea  0-4      Progression   Progression  Continue progressive overload as per policy without signs/symptoms or physical distress.      Resistance Training   Training Prescription  Yes    Weight  orange bands    Reps  10-15       Perform Capillary Blood Glucose checks as needed.  Exercise Prescription Changes: Exercise Prescription Changes    Row Name 02/05/18 1300 02/17/18 1231 03/03/18 1200         Response to Exercise   Blood Pressure (Admit)  144/52  122/58  136/62     Blood Pressure (Exercise)  178/72  146/50  138/70     Blood Pressure (Exit)  128/70  128/58  124/70     Heart Rate (Admit)  65 bpm  74 bpm  66 bpm     Heart Rate (Exercise)  93 bpm  92 bpm  87 bpm     Heart Rate (Exit)  68 bpm  81 bpm  61 bpm     Oxygen Saturation (Admit)  92 %  98 %  96 %     Oxygen Saturation (Exercise)  88 %  89 %  88 %     Oxygen Saturation (Exit)  96 %  100 %  97 %     Rating of Perceived Exertion (Exercise)  _0 Perceived Dyspnea (Exercise)  2  1  0     Duration  Progress to 45 minutes of aerobic exercise without signs/symptoms of physical distress  Progress to 45 minutes of aerobic exercise without signs/symptoms of physical distress  Progress to 45 minutes of aerobic exercise without signs/symptoms of physical distress     Intensity  Other (comment)  Other (comment)  Other (comment)       Progression   Progression  Continue to progress workloads to maintain intensity without signs/symptoms of physical distress. 40-80% HRR  Continue to progress workloads to maintain intensity without signs/symptoms of physical distress. 40-80% HRR  Continue to progress workloads to maintain intensity without signs/symptoms of physical distress. 40-80% HRR       Resistance  Training   Training Prescription  Yes  Yes  Yes     Weight  orange bands  orange bands  orange bands     Reps  10-15  10-15  10-15     Time  10 Minutes  10 Minutes  10 Minutes       Oxygen   Oxygen  Continuous  Continuous  Continuous     Liters  15 L, 100% NRB  15 L, 100% NRB  15 L, 100% NRB       Recumbant Bike   Level  2.5  2.5  -     Watts  10  10  -     Minutes  17  17  -       NuStep   Level  _1 SPM  80  80  80     Minutes  17  34  34     METs  2.4  2.4  2.2        Exercise Comments:   Exercise Goals and Review: Exercise Goals    Row Name 01/12/18 1437             Exercise Goals   Increase Physical Activity  Yes       Intervention  Provide advice, education, support and counseling about physical activity/exercise needs.;Develop an individualized exercise prescription for aerobic and resistive training based on initial evaluation findings, risk stratification, comorbidities and participant's personal goals.       Expected Outcomes  Short Term: Attend rehab on a regular basis to increase amount of physical activity.;Long Term: Add in home exercise to make exercise part of routine and to increase amount of physical activity.;Long Term: Exercising regularly at least 3-5 days a week.       Increase Strength and Stamina  Yes       Intervention  Provide advice, education, support and counseling about physical activity/exercise needs.;Develop an individualized exercise prescription for aerobic and resistive training based on initial evaluation findings, risk stratification, comorbidities and participant's personal goals.       Expected Outcomes  Short Term: Increase workloads from initial exercise prescription for resistance, speed, and METs.;Short Term: Perform resistance training exercises routinely during rehab and add in resistance training at home;Long Term: Improve cardiorespiratory fitness, muscular endurance and strength as measured by increased METs and  functional capacity (6MWT)       Able to understand and use rate of perceived exertion (RPE) scale  Yes       Intervention  Provide education and explanation on how to use RPE scale       Expected Outcomes  Short Term: Able to use RPE daily in rehab to express subjective intensity level;Long Term:  Able to use RPE to guide intensity level when exercising independently       Able to understand and use Dyspnea scale  Yes       Intervention  Provide education and explanation on how to use Dyspnea scale       Expected Outcomes  Short Term: Able to use Dyspnea scale daily in rehab to express subjective sense of shortness of breath during exertion;Long Term: Able to use Dyspnea scale to guide intensity level when exercising independently       Knowledge and understanding of Target Heart Rate Range (THRR)  Yes       Intervention  Provide education and explanation of THRR including how the numbers were predicted and where they are located for reference       Expected Outcomes  Short Term: Able to state/look up THRR;Short Term: Able to use daily as guideline for intensity in rehab;Long Term: Able to use THRR to govern intensity when exercising independently       Understanding of Exercise Prescription  Yes       Intervention  Provide education, explanation, and written materials on patient's individual exercise prescription       Expected Outcomes  Short Term: Able to explain program exercise prescription;Long Term: Able to explain home exercise prescription to exercise independently          Exercise Goals Re-Evaluation : Exercise Goals Re-Evaluation    Row Name 01/26/18 1346 02/20/18 1055 03/23/18 1655         Exercise Goal Re-Evaluation   Exercise Goals Review  -  Increase Physical Activity;Able to understand and use rate of perceived exertion (RPE) scale;Knowledge and understanding of Target Heart Rate Range (THRR);Understanding of Exercise  Prescription;Increase Strength and Stamina;Able to  understand and use Dyspnea scale  Increase Physical Activity;Able to understand and use rate of perceived exertion (RPE) scale;Knowledge and understanding of Target Heart Rate Range (THRR);Understanding of Exercise Prescription;Increase Strength and Stamina;Able to understand and use Dyspnea scale     Comments  Patient's first day of exercise will be 01/29/18  Patient is currently only doing seated exercise due to her desaturations. Will increase her to 25 liters with NRB next time she is here to see if this will allow her to walk on the track while keeping her o2 sats above 90%  Patient is currently only doing seated exercise due to her desaturations. Not exercising at home at this point due to high liter flow needs.     Expected Outcomes  -  Through exercise at rehab and at home, patient will increase physical capacity and be able to carry out ADL's with ease at home. Patient will also gain the confidence and knowledge to adhere to an exercise regime at home.  Through exercise at rehab and at home, patient will increase physical capacity and be able to carry out ADL's with ease at home. Patient will also gain the confidence and knowledge to adhere to an exercise regime at home.        Discharge Exercise Prescription (Final Exercise Prescription Changes): Exercise Prescription Changes - 03/03/18 1200      Response to Exercise   Blood Pressure (Admit)  136/62    Blood Pressure (Exercise)  138/70    Blood Pressure (Exit)  124/70    Heart Rate (Admit)  66 bpm    Heart Rate (Exercise)  87 bpm    Heart Rate (Exit)  61 bpm    Oxygen Saturation (Admit)  96 %    Oxygen Saturation (Exercise)  88 %    Oxygen Saturation (Exit)  97 %    Rating of Perceived Exertion (Exercise)  12    Perceived Dyspnea (Exercise)  0    Duration  Progress to 45 minutes of aerobic exercise without signs/symptoms of physical distress    Intensity  Other (comment)      Progression   Progression  Continue to progress workloads  to maintain intensity without signs/symptoms of physical distress. 40-80% HRR      Resistance Training   Training Prescription  Yes    Weight  orange bands    Reps  10-15    Time  10 Minutes      Oxygen   Oxygen  Continuous    Liters  15 L, 100% NRB      NuStep   Level  4    SPM  80    Minutes  34    METs  2.2       Nutrition:  Target Goals: Understanding of nutrition guidelines, daily intake of sodium <1570m, cholesterol <2058m calories 30% from fat and 7% or less from saturated fats, daily to have 5 or more servings of fruits and vegetables.  Biometrics: Pre Biometrics - 01/12/18 1459      Pre Biometrics   Grip Strength  30 kg        Nutrition Therapy Plan and Nutrition Goals: Nutrition Therapy & Goals - 02/12/18 1224      Nutrition Therapy   Diet  Heart Healthy      Personal Nutrition Goals   Nutrition Goal  Describe the benefit of including fruits, vegetables, whole grains, and low-fat dairy products in a healthy meal plan.  Intervention Plan   Intervention  Prescribe, educate and counsel regarding individualized specific dietary modifications aiming towards targeted core components such as weight, hypertension, lipid management, diabetes, heart failure and other comorbidities.    Expected Outcomes  Short Term Goal: Understand basic principles of dietary content, such as calories, fat, sodium, cholesterol and nutrients.;Long Term Goal: Adherence to prescribed nutrition plan.       Nutrition Assessments: Nutrition Assessments - 02/12/18 1224      Rate Your Plate Scores   Pre Score  58       Nutrition Goals Re-Evaluation:   Nutrition Goals Discharge (Final Nutrition Goals Re-Evaluation):   Psychosocial: Target Goals: Acknowledge presence or absence of significant depression and/or stress, maximize coping skills, provide positive support system. Participant is able to verbalize types and ability to use techniques and skills needed for reducing  stress and depression.  Initial Review & Psychosocial Screening: Initial Psych Review & Screening - 01/12/18 1456      Initial Review   Current issues with  None Identified      Family Dynamics   Good Support System?  Yes      Barriers   Psychosocial barriers to participate in program  There are no identifiable barriers or psychosocial needs.      Screening Interventions   Interventions  Encouraged to exercise       Quality of Life Scores:  Scores of 19 and below usually indicate a poorer quality of life in these areas.  A difference of  2-3 points is a clinically meaningful difference.  A difference of 2-3 points in the total score of the Quality of Life Index has been associated with significant improvement in overall quality of life, self-image, physical symptoms, and general health in studies assessing change in quality of life.   PHQ-9: Recent Review Flowsheet Data    Depression screen [facility] 2/9 01/12/2018   Decreased Interest 0   Down, Depressed, Hopeless 0   PHQ - 2 Score 0     Interpretation of Total Score  Total Score Depression Severity:  1-4 = Minimal depression, 5-9 = Mild depression, 10-14 = Moderate depression, 15-19 = Moderately severe depression, 20-27 = Severe depression   Psychosocial Evaluation and Intervention: Psychosocial Evaluation - 01/12/18 1457      Psychosocial Evaluation & Interventions   Interventions  Encouraged to exercise with the program and follow exercise prescription    Expected Outcomes  patient will remain free from psychosocial barriers to participation in pulmonary rehab    Continue Psychosocial Services   No Follow up required       Psychosocial Re-Evaluation: Psychosocial Re-Evaluation    [place] Name 02/23/18 1110 03/23/18 1309           Psychosocial Re-Evaluation   Current issues with  None Identified  None Identified      Comments  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      Expected Outcomes  patient will remain free from psychosocial barriers to participation in pulmonary rehab   patient will remain free from psychosocial barriers to participation in pulmonary rehab       Interventions  Encouraged to attend Pulmonary Rehabilitation for the exercise  Encouraged to attend Pulmonary Rehabilitation for the exercise      Continue Psychosocial Services  Follow up required by staff  Follow up required by staff         Psychosocial Discharge (Final Psychosocial Re-Evaluation): Psychosocial Re-Evaluation - 03/23/18 1309      Psychosocial Re-Evaluation   Current issues with  None Identified    Comments  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    Expected Outcomes  patient will remain free from psychosocial barriers to participation in pulmonary rehab     Interventions  Encouraged to attend Pulmonary Rehabilitation for the exercise    Continue Psychosocial Services   Follow up required by staff       Education: Education Goals: Education classes will be provided on a weekly basis, covering required topics. Participant will state understanding/return demonstration of topics presented.  Learning Barriers/Preferences: Learning Barriers/Preferences - 01/12/18 1453      Learning Barriers/Preferences   Learning Barriers  None    Learning Preferences  Written Material;Group Instruction;Verbal Instruction;Skilled Demonstration       Education Topics: Risk Factor Reduction:  -Group instruction that is supported by a PowerPoint presentation. Instructor discusses the definition of a risk factor, different risk factors for pulmonary disease, and how the heart and lungs work together.     Nutrition for Pulmonary Patient:   -Group instruction provided by PowerPoint slides, verbal discussion, and written materials to support subject matter. The instructor gives an explanation and review of healthy diet recommendations, which includes a discussion on weight management, recommendations for fruit and vegetable consumption, as well as protein, fluid, caffeine, fiber, sodium, sugar, and alcohol. Tips for eating when patients are short of breath are discussed.   PULMONARY REHAB CHRONIC OBSTRUCTIVE PULMONARY DISEASE from 03/19/2018 in [place]  Date  02/05/18  Educator  Parke Simmers  Instruction Review Code  2- Demonstrated Understanding      Pursed Lip Breathing:  -Group instruction that is supported by demonstration and informational handouts. Instructor discusses the benefits of pursed lip and diaphragmatic breathing and detailed demonstration on how to preform both.     Oxygen Safety:  -Group instruction provided by PowerPoint, verbal discussion, and written material to support subject matter. There is an overview of "What is Oxygen" and "Why do we need it".  Instructor also reviews how to create a safe environment for oxygen use, the importance of using oxygen as prescribed, and the risks of noncompliance. There is a brief discussion on traveling with oxygen and resources the patient may utilize.   PULMONARY REHAB CHRONIC OBSTRUCTIVE PULMONARY DISEASE from 03/19/2018 in [place]  Date  03/05/18  Educator  Zeola Brys  Instruction Review Code  2- Demonstrated Understanding      Oxygen Equipment:  -Group instruction provided by [facility] Staff utilizing handouts, written materials, and equipment demonstrations.   PULMONARY REHAB CHRONIC OBSTRUCTIVE PULMONARY DISEASE from 03/19/2018 in [place]  Date  03/19/18  Educator  [place]  Instruction Review Code  2- Demonstrated Understanding      Signs and Symptoms:  -Group  instruction provided by written material and verbal discussion to support subject matter. Warning signs and symptoms of infection, stroke, and heart attack are reviewed and when to call the physician/911 reinforced. Tips for preventing the spread of infection discussed.   Advanced Directives:  -Group instruction provided by verbal instruction and written material to support subject matter. Instructor reviews Advanced Directive laws and proper instruction for filling  out document.   Pulmonary Video:  -Group video education that reviews the importance of medication and oxygen compliance, exercise, good nutrition, pulmonary hygiene, and pursed lip and diaphragmatic breathing for the pulmonary patient.   Exercise for the Pulmonary Patient:  -Group instruction that is supported by a PowerPoint presentation. Instructor discusses benefits of exercise, core components of exercise, frequency, duration, and intensity of an exercise routine, importance of utilizing pulse oximetry during exercise, safety while exercising, and options of places to exercise outside of rehab.     Pulmonary Medications:  -Verbally interactive group education provided by instructor with focus on inhaled medications and proper administration.   PULMONARY REHAB CHRONIC OBSTRUCTIVE PULMONARY DISEASE from 03/19/2018 in [place]  Date  02/12/18  Educator  Pharm  Instruction Review Code  2- Demonstrated Understanding      Anatomy and Physiology of the Respiratory System and Intimacy:  -Group instruction provided by PowerPoint, verbal discussion, and written material to support subject matter. Instructor reviews respiratory cycle and anatomical components of the respiratory system and their functions. Instructor also reviews differences in obstructive and restrictive respiratory diseases with examples of each. Intimacy, Sex, and Sexuality differences are reviewed with a discussion on how  relationships can change when diagnosed with pulmonary disease. Common sexual concerns are reviewed.   PULMONARY REHAB CHRONIC OBSTRUCTIVE PULMONARY DISEASE from 03/19/2018 in [place]  Date  01/29/18  Educator  rn  Instruction Review Code  2- Demonstrated Understanding      MD DAY -A group question and answer session with a medical doctor that allows participants to ask questions that relate to their pulmonary disease state.   OTHER EDUCATION -Group or individual verbal, written, or video instructions that support the educational goals of the pulmonary rehab program.   Holiday Eating Survival Tips:  -Group instruction provided by PowerPoint slides, verbal discussion, and written materials to support subject matter. The instructor gives patients tips, tricks, and techniques to help them not only survive but enjoy the holidays despite the onslaught of food that accompanies the holidays.   Knowledge Questionnaire Score: Knowledge Questionnaire Score - 01/15/18 1746      Knowledge Questionnaire Score   Pre Score  13/18       Core Components/Risk Factors/Patient Goals at Admission: Personal Goals and Risk Factors at Admission - 01/12/18 1456      Core Components/Risk Factors/Patient Goals on Admission   Improve shortness of breath with ADL's  Yes    Intervention  Provide education, individualized exercise plan and daily activity instruction to help decrease symptoms of SOB with activities of daily living.    Expected Outcomes  Short Term: Improve cardiorespiratory fitness to achieve a reduction of symptoms when performing ADLs;Long Term: Be able to perform more ADLs without symptoms or delay the onset of symptoms       Core Components/Risk Factors/Patient Goals Review:  Goals and Risk Factor Review    Row Name 02/23/18 1105 03/23/18 1302           Core Components/Risk Factors/Patient Goals Review   Personal Goals Review  Improve shortness of  breath with ADL's  Improve shortness of breath with ADL's;Develop more efficient breathing techniques such as purse lipped breathing and diaphragmatic breathing and practicing self-pacing with activity.      Review  patient got off to a slow start in pulmonary rehab related to her inability to stay oxygenated with the maximum amount of oxygen we could supply to her.  in collaboration with her pulmonologist it was decided that she would not walk during rehab related to continued desaturations below adjusted parameters on maximum flow. patient does tolerated seated exercise and increased workloads on equiptment. she has not seen an improvement in her shortness of breath as of yet. will continue to monitor and hope to see progression towards improvement in shortness of breath over the next 30 days.  [name] is doing OK in pulmonary rehab. she was seen by our medical director during the last 30 days and it was identified that the patient was in need of a HF consult for her [place]. she subsiquently saw her primary pulmonologist who facilitated this referral and patient is scheduled to see Dr. Haroldine Laws in June. she remains on 100% NRB for exercise however if she is not walking we maybe able to do a trial of O2 using a venti mask at 50%. During ambulation she continues to desaturate significantly unless she is on 25L however our current order is only for 15. will wait until patient has appointment with HF clinic for more guided direction. she states she enjoys PR and believes her shortness of breath has improved during her admission. she has had a minor incidences of epitaxis that has prohibited her from exercise. That has been resolved with a visit to the ENT and directions on nasal hydration and use of Afrin for bleeding. will continued to monitor towards pulmonary rehab goals during the next 30 days.      Expected Outcomes  see admission expected outcomes.  see admission expected outcomes.         Core Components/Risk  Factors/Patient Goals at Discharge (Final Review):  Goals and Risk Factor Review - 03/23/18 1302      Core Components/Risk Factors/Patient Goals Review   Personal Goals Review  Improve shortness of breath with ADL's;Develop more efficient breathing techniques such as purse lipped breathing and diaphragmatic breathing and practicing self-pacing with activity.    Review  [name] is doing OK in pulmonary rehab. she was seen by our medical director during the last 30 days and it was identified that the patient was in need of a HF consult for her [place]. she subsiquently saw her primary pulmonologist who facilitated this referral and patient is scheduled to see Dr. Haroldine Laws in June. she remains on 100% NRB for exercise however if she is not walking we maybe able to do a trial of O2 using a venti mask at 50%. During ambulation she continues to desaturate significantly unless she is on 25L however our current order is only for 15. will wait until patient has appointment with HF clinic for more guided direction. she states she enjoys PR and believes her shortness of breath has improved during her admission. she has had a minor incidences of epitaxis that has prohibited her from exercise. That has been resolved with a visit to the ENT and directions on nasal hydration and use of Afrin for bleeding. will continued to monitor towards pulmonary rehab goals during the next 30 days.    Expected Outcomes  see admission expected outcomes.       ITP Comments: ITP Comments    Row Name 03/23/18 1257           ITP Comments  Dr Jennet Maduro, Medical Director          Comments: patient has attended 13 pulmonary rehab sessions since admission

## 2018-03-26 NOTE — Progress Notes (Addendum)
[name] 84 y.o. female  36 day Psychosocial Note  Patient psychosocial assessment reveals no barriers to participation in Pulmonary Rehab. Patient has had several physical barriers to participation over the last 30 days including epistaxis. This has been treated by an MD and has not been an issue since recommended treatment.  Patient does continue to exhibit positive coping skills to deal with any psychosocial concerns that may arrise. Offered emotional support and reassurance. Patient does feel she is making progress toward Pulmonary Rehab goals. Patient reports her health and activity level has not improved significantly in the past 30 days however she has gained a lot of knowledge regarding how to safely exert herself, when to take rest breaks, self monitoring, when to notify the MD and oxygen compliance. She is also no longer cooking on a gas stove with open flame while wearing her high flow oxygen. Her family made arrangements for other means of cooking.  Patient states family/friends have noticed changes in her mood and confidence. Patient reports  feeling positive about current and projected progression in Pulmonary Rehab. After reviewing the patient's treatment plan, the patient is making progress toward Pulmonary Rehab goals. Patient's rate of progress toward rehab goals is fair. Plan of action to help patient continue to work towards rehab goals include increasing workloads on equipment as tolerated in order to increase patients stamina and strength. Will continue to monitor and evaluate progress toward psychosocial goal(s).  Goal(s) in progress: Improved management of anxiety related to inability to perform tasks with high oxygen levels.  Improved coping skills  Help patient work toward returning to meaningful activities that improve patient's QOL and are attainable with patient's lung disease

## 2018-03-28 ENCOUNTER — Telehealth: Payer: Self-pay | Admitting: Student

## 2018-03-28 NOTE — Telephone Encounter (Signed)
   Patient called the after-hours line reporting she experienced dyspnea earlier this morning but symptoms have now improved. Has COPD and Pulmonary HTN (uses 6L  with exertion). She denies any associated chest pain. Has been experiencing pain along her left shoulder since yesterday afternoon when she received the Shingles vaccination. Shoulder pain is worse with movement.   As she denies any current symptoms including acute dyspnea, chest pain, orthopnea, PND, or lower extremity edema, recommended continuing monitoring of her symptoms for now. She is aware to proceed to the ED if symptoms acutely worsen.   She voiced understanding of this and was appreciative of the call.  Signed, Erma Heritage, PA-C 03/28/2018, 5:05 PM Pager: [phone]

## 2018-03-30 ENCOUNTER — Ambulatory Visit: Payer: Medicare Other | Admitting: Pulmonary Disease

## 2018-03-31 ENCOUNTER — Encounter (HOSPITAL_COMMUNITY)
Admission: RE | Admit: 2018-03-31 | Discharge: 2018-03-31 | Disposition: A | Discharge status: Home / Self Care | Payer: Medicare Other | Source: Ambulatory Visit | Attending: Pulmonary Disease | Admitting: Pulmonary Disease

## 2018-03-31 VITALS — Wt 155.9 lb

## 2018-03-31 DIAGNOSIS — J449 Chronic obstructive pulmonary disease, unspecified: Secondary | ICD-10-CM | POA: Diagnosis not present

## 2018-03-31 DIAGNOSIS — Z79899 Other long term (current) drug therapy: Secondary | ICD-10-CM | POA: Diagnosis not present

## 2018-03-31 DIAGNOSIS — Z9889 Other specified postprocedural states: Secondary | ICD-10-CM | POA: Diagnosis not present

## 2018-03-31 DIAGNOSIS — Z86010 Personal history of colonic polyps: Secondary | ICD-10-CM | POA: Diagnosis not present

## 2018-03-31 DIAGNOSIS — E0590 Thyrotoxicosis, unspecified without thyrotoxic crisis or storm: Secondary | ICD-10-CM | POA: Diagnosis not present

## 2018-03-31 DIAGNOSIS — M1990 Unspecified osteoarthritis, unspecified site: Secondary | ICD-10-CM | POA: Diagnosis not present

## 2018-03-31 NOTE — Progress Notes (Signed)
Daily Session Note  Patient Details  Name: [name] MRN: [id number] Date of Birth: 1929/02/24 Referring Provider:     Pulmonary Rehab Walk Test from 01/22/2018 in [place]  Referring Provider  Dr. Elsworth Soho      Encounter Date: 03/31/2018  Check In: Session Check In - 03/31/18 1216      Check-In   Location  [facility]    Staff Present  Rodney Langton, RN;Ruthann Angulo, MS, ACSM RCEP, Exercise Physiologist;Annedrea Rosezella [place], RN, [place] physician immediately available to respond to emergencies  [facility] immediately available    Physician(s)  Dr. Loleta Books    Medication changes reported      No    Fall or balance concerns reported     No    Tobacco Cessation  No Change    Warm-up and Cool-down  Performed as group-led instruction    Resistance Training Performed  Yes    VAD Patient?  No      Pain Assessment   Currently in Pain?  No/denies    Multiple Pain Sites  No       Capillary Blood Glucose: No results found for this or any previous visit (from the past 24 hour(s)).  Exercise Prescription Changes - 03/31/18 1200      Response to Exercise   Blood Pressure (Admit)  124/64    Blood Pressure (Exercise)  144/58    Blood Pressure (Exit)  120/60    Heart Rate (Admit)  71 bpm    Heart Rate (Exercise)  103 bpm    Heart Rate (Exit)  99 bpm    Oxygen Saturation (Admit)  99 %    Oxygen Saturation (Exercise)  88 %    Oxygen Saturation (Exit)  100 %    Rating of Perceived Exertion (Exercise)  13    Perceived Dyspnea (Exercise)  1    Duration  Progress to 45 minutes of aerobic exercise without signs/symptoms of physical distress    Intensity  Other (comment)      Progression   Progression  Continue to progress workloads to maintain intensity without signs/symptoms of physical distress. 40-80% HRR      Resistance Training   Training Prescription  Yes    Weight  orange bands    Reps  10-15    Time  10  Minutes      Oxygen   Oxygen  Continuous    Liters  15 L, 100% NRB      Recumbant Bike   Level  2    Minutes  17      NuStep   Level  4    SPM  80    Minutes  34    METs  1.9       Social History   Tobacco Use  Smoking Status Former Smoker  . Packs/day: 1.00  . Years: 40.00  . Pack years: 40.00  . Types: Cigarettes  . Last attempt to quit: 11/18/1993  . Years since quitting: 24.3  Smokeless Tobacco Never Used    Goals Met:  Exercise tolerated well No report of cardiac concerns or symptoms Strength training completed today  Goals Unmet:  Not Applicable  Comments: Service time is from 10:30A to 12:00P    Dr. Rush Farmer is Medical Director for Pulmonary Rehab at [facility].

## 2018-04-02 ENCOUNTER — Encounter (HOSPITAL_COMMUNITY)
Admission: RE | Admit: 2018-04-02 | Discharge: 2018-04-02 | Disposition: A | Discharge status: Home / Self Care | Payer: Medicare Other | Source: Ambulatory Visit | Attending: Pulmonary Disease | Admitting: Pulmonary Disease

## 2018-04-02 DIAGNOSIS — Z9889 Other specified postprocedural states: Secondary | ICD-10-CM | POA: Diagnosis not present

## 2018-04-02 DIAGNOSIS — J449 Chronic obstructive pulmonary disease, unspecified: Secondary | ICD-10-CM | POA: Diagnosis not present

## 2018-04-02 DIAGNOSIS — Z79899 Other long term (current) drug therapy: Secondary | ICD-10-CM | POA: Diagnosis not present

## 2018-04-02 DIAGNOSIS — M1990 Unspecified osteoarthritis, unspecified site: Secondary | ICD-10-CM | POA: Diagnosis not present

## 2018-04-02 DIAGNOSIS — Z86010 Personal history of colonic polyps: Secondary | ICD-10-CM | POA: Diagnosis not present

## 2018-04-02 DIAGNOSIS — E0590 Thyrotoxicosis, unspecified without thyrotoxic crisis or storm: Secondary | ICD-10-CM | POA: Diagnosis not present

## 2018-04-02 NOTE — Progress Notes (Signed)
Daily Session Note  Patient Details  Name: [name] MRN: [id number] Date of Birth: Jun 07, 1929 Referring Provider:     Pulmonary Rehab Walk Test from 01/22/2018 in [place]  Referring Provider  Dr. Elsworth Soho      Encounter Date: 04/02/2018  Check In: Session Check In - 04/02/18 1418      Check-In   Location  [facility]    Staff Present  Rodney Langton, RN;Walida Cajas, MS, ACSM RCEP, Exercise Physiologist    Supervising physician immediately available to respond to emergencies  [facility] immediately available    Physician(s)  Dr. Alen Bleacher Blood Glucose: No results found for this or any previous visit (from the past 24 hour(s)).    Social History   Tobacco Use  Smoking Status Former Smoker  . Packs/day: 1.00  . Years: 40.00  . Pack years: 40.00  . Types: Cigarettes  . Last attempt to quit: 11/18/1993  . Years since quitting: 24.3  Smokeless Tobacco Never Used    Goals Met:  Exercise tolerated well No report of cardiac concerns or symptoms Strength training completed today  Goals Unmet:  Not Applicable  Comments: Service time is from 10:30a to 12:25p    Dr. Rush Farmer is Medical Director for Pulmonary Rehab at [facility].

## 2018-04-06 ENCOUNTER — Ambulatory Visit (INDEPENDENT_AMBULATORY_CARE_PROVIDER_SITE_OTHER): Payer: Medicare Other | Admitting: Pulmonary Disease

## 2018-04-06 ENCOUNTER — Encounter: Payer: Self-pay | Admitting: Pulmonary Disease

## 2018-04-06 DIAGNOSIS — J9611 Chronic respiratory failure with hypoxia: Secondary | ICD-10-CM

## 2018-04-06 DIAGNOSIS — I779 Disorder of arteries and arterioles, unspecified: Secondary | ICD-10-CM | POA: Diagnosis not present

## 2018-04-06 DIAGNOSIS — I272 Pulmonary hypertension, unspecified: Secondary | ICD-10-CM

## 2018-04-06 MED ORDER — TADALAFIL 20 MG PO TABS: 20.0000 mg | ORAL_TABLET | Freq: Every day | ORAL | 0 refills | Status: DC | PRN

## 2018-04-06 NOTE — Patient Instructions (Addendum)
Trial of tadalafil 20 mg daily x 15 Call me to report on this is working-before we give you refills.  We discussed side effect of dizziness.  Stay on Lasix and Anoro

## 2018-04-06 NOTE — Progress Notes (Signed)
   Subjective:    Patient ID: [name], female    DOB: [birth year], 84 y.o.   MRN: [id number]  HPI  84yo former smoker followed for COPD on home O2 with severepulmonary HTN  Hx of polycythemia followed by hematology  Has D CHF and CAD([name] )   She is compliant with oxygen.  She uses as liters 4 L at rest and 8 L on exertion.  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 We tried diuresis with Lasix but this does not seem to improve her hypoxia much no more episodes of syncope, last such episode in march 2019       Significant tests/ events  Chest x-ray shows increased interstitial prominence compared to her older films from 2009 HRCT 01/2016 >> no evidence of interstitial lung disease, mild bibasal scarring  PFTs 01/2016 -FEV1 1.15 - 64%, no BD response, DLCO 22%  Echo 12/2015 - PAP 64, gr 2 DD, nml LV fn Echo 01/2018 ( bubble study)>. PFO with R--> L shunt  Review of Systems neg for any significant sore throat, dysphagia, itching, sneezing, nasal congestion or excess/ purulent secretions, fever, chills, sweats, unintended wt loss, pleuritic or exertional cp, hempoptysis, orthopnea pnd or change in chronic leg swelling.   Also denies presyncope, palpitations, heartburn, abdominal pain, nausea, vomiting, diarrhea or change in bowel or urinary habits, dysuria,hematuria, rash, arthralgias, visual complaints, headache, numbness weakness or ataxia.     Objective:   Physical Exam  Gen. Pleasant, elderly,well-nourished, in no distress ENT - no thrush, no post nasal drip Neck: No JVD, no thyromegaly, no carotid bruits Lungs: no use of accessory muscles, no dullness to percussion, clear without rales or rhonchi  Cardiovascular: Rhythm regular, heart sounds  normal, no murmurs or gallops, no peripheral edema Musculoskeletal: No deformities, no cyanosis or clubbing        Assessment & Plan:

## 2018-04-06 NOTE — Assessment & Plan Note (Signed)
Stay on Lasix and Anoro

## 2018-04-06 NOTE — Assessment & Plan Note (Signed)
Trial of tadalafil 20 mg daily x 15 Call me to report on this is working-before we give you refills.  We discussed side effect of dizziness.  Confirmed that she is not taking nitro She has appointment with advanced heart failure clinic in 1 month

## 2018-04-06 NOTE — Assessment & Plan Note (Signed)
Ct 4L at rest & 8L on exertion

## 2018-04-07 ENCOUNTER — Encounter (HOSPITAL_COMMUNITY)
Admission: RE | Admit: 2018-04-07 | Discharge: 2018-04-07 | Disposition: A | Discharge status: Home / Self Care | Payer: Medicare Other | Source: Ambulatory Visit | Attending: Pulmonary Disease | Admitting: Pulmonary Disease

## 2018-04-07 DIAGNOSIS — Z86010 Personal history of colonic polyps: Secondary | ICD-10-CM | POA: Diagnosis not present

## 2018-04-07 DIAGNOSIS — Z79899 Other long term (current) drug therapy: Secondary | ICD-10-CM | POA: Diagnosis not present

## 2018-04-07 DIAGNOSIS — M1990 Unspecified osteoarthritis, unspecified site: Secondary | ICD-10-CM | POA: Diagnosis not present

## 2018-04-07 DIAGNOSIS — E0590 Thyrotoxicosis, unspecified without thyrotoxic crisis or storm: Secondary | ICD-10-CM | POA: Diagnosis not present

## 2018-04-07 DIAGNOSIS — Z9889 Other specified postprocedural states: Secondary | ICD-10-CM | POA: Diagnosis not present

## 2018-04-07 DIAGNOSIS — J449 Chronic obstructive pulmonary disease, unspecified: Secondary | ICD-10-CM

## 2018-04-07 NOTE — Progress Notes (Signed)
Daily Session Note  Patient Details  Name: [name] MRN: [id number] Date of Birth: 12/08/1928 Referring Provider:     Pulmonary Rehab Walk Test from 01/22/2018 in [place]  Referring Provider  Dr. Elsworth Soho      Encounter Date: 04/07/2018  Check In: Session Check In - 04/07/18 1202      Check-In   Location  [facility]    Staff Present  Rodney Langton, RN;Nyela Cortinas, MS, ACSM RCEP, Exercise Physiologist;Carlette Wilber Oliph[place], RN, BSN    Supervising physician immediately available to respond to emergencies  [facility] immediately available    Physician(s)  Dr. Sarajane Jews    Medication changes reported      No    Fall or balance concerns reported     No    Tobacco Cessation  No Change    Warm-up and Cool-down  Performed as group-led instruction    Resistance Training Performed  Yes    VAD Patient?  No      Pain Assessment   Currently in Pain?  No/denies       Capillary Blood Glucose: No results found for this or any previous visit (from the past 24 hour(s)).    Social History   Tobacco Use  Smoking Status Former Smoker  . Packs/day: 1.00  . Years: 40.00  . Pack years: 40.00  . Types: Cigarettes  . Last attempt to quit: 11/18/1993  . Years since quitting: 24.4  Smokeless Tobacco Never Used    Goals Met:  Exercise tolerated well No report of cardiac concerns or symptoms Strength training completed today  Goals Unmet:  Not Applicable  Comments: Service time is from 10:30a to 12:05p    Dr. Rush Farmer is Medical Director for Pulmonary Rehab at [facility].

## 2018-04-08 ENCOUNTER — Other Ambulatory Visit: Payer: Self-pay | Admitting: Internal Medicine

## 2018-04-08 DIAGNOSIS — Z1231 Encounter for screening mammogram for malignant neoplasm of breast: Secondary | ICD-10-CM

## 2018-04-09 ENCOUNTER — Encounter (HOSPITAL_COMMUNITY)
Admission: RE | Admit: 2018-04-09 | Discharge: 2018-04-09 | Disposition: A | Discharge status: Home / Self Care | Payer: Medicare Other | Source: Ambulatory Visit | Attending: Pulmonary Disease | Admitting: Pulmonary Disease

## 2018-04-09 DIAGNOSIS — M1990 Unspecified osteoarthritis, unspecified site: Secondary | ICD-10-CM | POA: Diagnosis not present

## 2018-04-09 DIAGNOSIS — Z9889 Other specified postprocedural states: Secondary | ICD-10-CM | POA: Diagnosis not present

## 2018-04-09 DIAGNOSIS — J449 Chronic obstructive pulmonary disease, unspecified: Secondary | ICD-10-CM

## 2018-04-09 DIAGNOSIS — E0590 Thyrotoxicosis, unspecified without thyrotoxic crisis or storm: Secondary | ICD-10-CM | POA: Diagnosis not present

## 2018-04-09 DIAGNOSIS — Z86010 Personal history of colonic polyps: Secondary | ICD-10-CM | POA: Diagnosis not present

## 2018-04-09 DIAGNOSIS — Z79899 Other long term (current) drug therapy: Secondary | ICD-10-CM | POA: Diagnosis not present

## 2018-04-09 NOTE — Progress Notes (Signed)
Daily Session Note  Patient Details  Name: [name] MRN: [id number] Date of Birth: August 15, 1929 Referring Provider:     Pulmonary Rehab Walk Test from 01/22/2018 in [place]  Referring Provider  Dr. Elsworth Soho      Encounter Date: 04/09/2018  Check In: Session Check In - 04/09/18 1212      Check-In   Location  [facility]    Staff Present  Rodney Langton, RN;Lochlin Eppinger, MS, ACSM RCEP, Exercise Physiologist    Supervising physician immediately available to respond to emergencies  [facility] immediately available    Physician(s)  Dr. Wyline Copas    Medication changes reported      No    Fall or balance concerns reported     No    Tobacco Cessation  No Change    Warm-up and Cool-down  Performed as group-led instruction    Resistance Training Performed  Yes    VAD Patient?  No      Pain Assessment   Currently in Pain?  No/denies    Multiple Pain Sites  No       Capillary Blood Glucose: No results found for this or any previous visit (from the past 24 hour(s)).    Social History   Tobacco Use  Smoking Status Former Smoker  . Packs/day: 1.00  . Years: 40.00  . Pack years: 40.00  . Types: Cigarettes  . Last attempt to quit: 11/18/1993  . Years since quitting: 24.4  Smokeless Tobacco Never Used    Goals Met:  Exercise tolerated well No report of cardiac concerns or symptoms Strength training completed today  Goals Unmet:  Not Applicable  Comments: Service time is from 10:30a to 12:00p    Dr. Rush Farmer is Medical Director for Pulmonary Rehab at [facility].

## 2018-04-11 ENCOUNTER — Other Ambulatory Visit: Payer: Self-pay | Admitting: Adult Health

## 2018-04-14 ENCOUNTER — Encounter (HOSPITAL_COMMUNITY)
Admission: RE | Admit: 2018-04-14 | Discharge: 2018-04-14 | Disposition: A | Discharge status: Home / Self Care | Payer: Medicare Other | Source: Ambulatory Visit | Attending: Pulmonary Disease | Admitting: Pulmonary Disease

## 2018-04-14 VITALS — Wt 157.2 lb

## 2018-04-14 DIAGNOSIS — M1990 Unspecified osteoarthritis, unspecified site: Secondary | ICD-10-CM | POA: Diagnosis not present

## 2018-04-14 DIAGNOSIS — J449 Chronic obstructive pulmonary disease, unspecified: Secondary | ICD-10-CM

## 2018-04-14 DIAGNOSIS — Z9889 Other specified postprocedural states: Secondary | ICD-10-CM | POA: Diagnosis not present

## 2018-04-14 DIAGNOSIS — Z86010 Personal history of colonic polyps: Secondary | ICD-10-CM | POA: Diagnosis not present

## 2018-04-14 DIAGNOSIS — Z79899 Other long term (current) drug therapy: Secondary | ICD-10-CM | POA: Diagnosis not present

## 2018-04-14 DIAGNOSIS — E0590 Thyrotoxicosis, unspecified without thyrotoxic crisis or storm: Secondary | ICD-10-CM | POA: Diagnosis not present

## 2018-04-14 NOTE — Progress Notes (Signed)
Daily Session Note  Patient Details  Name: [name] MRN: [id number] Date of Birth: 08-02-1929 Referring Provider:     Pulmonary Rehab Walk Test from 01/22/2018 in [place]  Referring Provider  Dr. Elsworth Soho      Encounter Date: 04/14/2018  Check In: Session Check In - 04/14/18 1215      Check-In   Location  [facility]    Staff Present  Cloyde Reams Elissia Spiewak, MS, ACSM RCEP, Exercise Physiologist;Annedrea Rosezella [place], RN, MHA;Olinty Celesta Aver, MS, ACSM CEP, Exercise Physiologist    Supervising physician immediately available to respond to emergencies  [facility] immediately available    Physician(s)  Dr. Wyline Copas    Medication changes reported      No    Fall or balance concerns reported     No    Tobacco Cessation  No Change    Warm-up and Cool-down  Performed as group-led instruction    Resistance Training Performed  Yes    VAD Patient?  No      Pain Assessment   Currently in Pain?  No/denies    Multiple Pain Sites  No       Capillary Blood Glucose: No results found for this or any previous visit (from the past 24 hour(s)).  Exercise Prescription Changes - 04/14/18 1200      Response to Exercise   Blood Pressure (Admit)  124/74    Blood Pressure (Exercise)  164/64    Blood Pressure (Exit)  130/80    Heart Rate (Admit)  75 bpm    Heart Rate (Exercise)  104 bpm    Heart Rate (Exit)  67 bpm    Oxygen Saturation (Admit)  99 %    Oxygen Saturation (Exercise)  80 %    Oxygen Saturation (Exit)  97 %    Rating of Perceived Exertion (Exercise)  11    Perceived Dyspnea (Exercise)  1    Duration  Progress to 45 minutes of aerobic exercise without signs/symptoms of physical distress    Intensity  Other (comment)      Progression   Progression  Continue to progress workloads to maintain intensity without signs/symptoms of physical distress. 40-80% HRR      Resistance Training   Training Prescription  Yes    Weight  orange  bands    Reps  10-15    Time  10 Minutes      Oxygen   Oxygen  Continuous    Liters  15 L, 100% NRB      Recumbant Bike   Level  2    Minutes  17      NuStep   Level  4    SPM  80    Minutes  34    METs  1.9       Social History   Tobacco Use  Smoking Status Former Smoker  . Packs/day: 1.00  . Years: 40.00  . Pack years: 40.00  . Types: Cigarettes  . Last attempt to quit: 11/18/1993  . Years since quitting: 24.4  Smokeless Tobacco Never Used    Goals Met:  Exercise tolerated well No report of cardiac concerns or symptoms Strength training completed today  Goals Unmet:  Not Applicable  Comments: Service time is from 10:30a to 12:00p    Dr. Rush Farmer is Medical Director for Pulmonary Rehab at [facility].

## 2018-04-16 ENCOUNTER — Encounter (HOSPITAL_COMMUNITY)
Admission: RE | Admit: 2018-04-16 | Discharge: 2018-04-16 | Disposition: A | Discharge status: Home / Self Care | Payer: Medicare Other | Source: Ambulatory Visit | Attending: Pulmonary Disease | Admitting: Pulmonary Disease

## 2018-04-16 DIAGNOSIS — Z86010 Personal history of colonic polyps: Secondary | ICD-10-CM | POA: Diagnosis not present

## 2018-04-16 DIAGNOSIS — Z9889 Other specified postprocedural states: Secondary | ICD-10-CM | POA: Diagnosis not present

## 2018-04-16 DIAGNOSIS — J449 Chronic obstructive pulmonary disease, unspecified: Secondary | ICD-10-CM

## 2018-04-16 DIAGNOSIS — Z79899 Other long term (current) drug therapy: Secondary | ICD-10-CM | POA: Diagnosis not present

## 2018-04-16 DIAGNOSIS — E0590 Thyrotoxicosis, unspecified without thyrotoxic crisis or storm: Secondary | ICD-10-CM | POA: Diagnosis not present

## 2018-04-16 DIAGNOSIS — M1990 Unspecified osteoarthritis, unspecified site: Secondary | ICD-10-CM | POA: Diagnosis not present

## 2018-04-16 NOTE — Progress Notes (Signed)
Daily Session Note  Patient Details  Name: [name] MRN: [id number] Date of Birth: 1929-09-06 Referring Provider:     Pulmonary Rehab Walk Test from 01/22/2018 in [place]  Referring Provider  Dr. Elsworth Soho      Encounter Date: 04/16/2018  Check In: Session Check In - 04/16/18 1102      Check-In   Location  [facility]    Staff Present  Cloyde Reams Lillyn Wieczorek, MS, ACSM RCEP, Exercise Physiologist;Carlette Wilber Oliphant, RN, BSN    Supervising physician immediately available to respond to emergencies  [facility] immediately available    Physician(s)  Dr. Denton Brick    Medication changes reported      No    Fall or balance concerns reported     No    Tobacco Cessation  No Change    Warm-up and Cool-down  Performed as group-led instruction    Resistance Training Performed  Yes    VAD Patient?  No      Pain Assessment   Currently in Pain?  No/denies    Multiple Pain Sites  No       Capillary Blood Glucose: No results found for this or any previous visit (from the past 24 hour(s)).    Social History   Tobacco Use  Smoking Status Former Smoker  . Packs/day: 1.00  . Years: 40.00  . Pack years: 40.00  . Types: Cigarettes  . Last attempt to quit: 11/18/1993  . Years since quitting: 24.4  Smokeless Tobacco Never Used    Goals Met:  Exercise tolerated well No report of cardiac concerns or symptoms Strength training completed today  Goals Unmet:  Not Applicable  Comments: Service time is from 10:30A to 12:30P    Dr. Rush Farmer is Medical Director for Pulmonary Rehab at [facility].

## 2018-04-20 ENCOUNTER — Encounter (HOSPITAL_COMMUNITY): Payer: Self-pay

## 2018-04-20 NOTE — Progress Notes (Signed)
Pulmonary Individual Treatment Plan  Patient Details  Name: [name] MRN: [id number] Date of Birth: 02-18-1929 Referring Provider:     Pulmonary Rehab Walk Test from 01/22/2018 in [place]  Referring Provider  Dr. Elsworth Soho      Initial Encounter Date:    Pulmonary Rehab Walk Test from 01/22/2018 in [place]  Date  01/22/18  Referring Provider  Dr. Elsworth Soho      Visit Diagnosis: Chronic obstructive pulmonary disease, unspecified COPD type ([place])  Patient's Home Medications on Admission:   Current Outpatient Medications:  .  acetaminophen (TYLENOL) 500 MG tablet, Take 1,000 mg by mouth at bedtime. , Disp: , Rfl:  .  albuterol (PROVENTIL HFA;VENTOLIN HFA) 108 (90 Base) MCG/ACT inhaler, Inhale 2 puffs into the lungs every 6 (six) hours as needed for wheezing or shortness of breath., Disp: 1 Inhaler, Rfl: 1 .  Aloe-Sodium Chloride (AYR SALINE NASAL GEL NA), Place 1 application into the nose daily as needed (dryness)., Disp: , Rfl:  .  ANORO ELLIPTA 62.5-25 MCG/INH AEPB, INHALE 1 PUFF INTO THE LUNGS DAILY, Disp: 60 each, Rfl: 5 .  aspirin 81 MG tablet, Take 81 mg by mouth daily.  , Disp: , Rfl:  .  Carboxymethylcellul-Glycerin (LUBRICATING EYE DROPS OP), Apply 1 drop to eye daily as needed (dry eyes)., Disp: , Rfl:  .  Cyanocobalamin (B-12) 500 MCG TABS, Take 500 mcg by mouth daily., Disp: , Rfl:  .  denosumab (PROLIA) 60 MG/ML SOLN injection, Inject 60 mg into the skin every 6 (six) months. Administer in upper arm, thigh, or abdomen, Disp: , Rfl:  .  furosemide (LASIX) 20 MG tablet, Take 1 tablet (20 mg total) by mouth daily., Disp: 90 tablet, Rfl: 1 .  isosorbide mononitrate (IMDUR) 30 MG 24 hr tablet, TAKE 1 TABLET(30 MG) BY MOUTH DAILY, Disp: 90 tablet, Rfl: 2 .  levothyroxine (SYNTHROID, LEVOTHROID) 137 MCG tablet, Take 137 mcg by mouth daily.  , Disp: , Rfl:  .  losartan (COZAAR) 25 MG tablet, Take 1 tablet (25 mg total) by  mouth daily., Disp: 30 tablet, Rfl: 6 .  metoprolol tartrate (LOPRESSOR) 50 MG tablet, Take 1 tablet (50 mg total) by mouth 2 (two) times daily., Disp: 180 tablet, Rfl: 3 .  multivitamin-lutein (OCUVITE-LUTEIN) CAPS capsule, Take 1 capsule by mouth daily., Disp: , Rfl:  .  nitroGLYCERIN (NITROSTAT) 0.4 MG SL tablet, Place 0.4 mg under the tongue every 5 (five) minutes as needed for chest pain (x 3 pills)., Disp: , Rfl:  .  OXYGEN, 4 lpm with sleep and 6lpm all other times, Disp: , Rfl:  .  rosuvastatin (CRESTOR) 40 MG tablet, Take 20 mg by mouth at bedtime. , Disp: , Rfl:  .  tadalafil (CIALIS) 20 MG tablet, 1 tablet by mouth daily for pulmonary hypertension., Disp: 30 tablet, Rfl: 5  Past Medical History: Past Medical History:  Diagnosis Date  . Arthritis   . B12 deficiency   . Basal cell cancer    Nose  . Carotid artery occlusion   . Cataract   . Chronic diastolic CHF (congestive heart failure) ([place])   . Colon polyps   . COPD (chronic obstructive pulmonary disease) ([place])    STAGE 2  . Coronary artery disease    status post angioplasty of the mid RCA in 1995  . Dyspnea   . First degree atrioventricular block   . History of obesity   . Hyperlipidemia   .  Hypertension    well controlled  . Hyperthyroidism   . Lichen sclerosus et atrophicus of the vulva   . Osteopenia   . PAD (peripheral artery disease) ([facility])    with stable claudication  . Pulmonary fibrosis ([facility])    wears 4L home O2 at baseline    Tobacco Use: Social History   Tobacco Use  Smoking Status Former Smoker  . Packs/day: 1.00  . Years: 40.00  . Pack years: 40.00  . Types: Cigarettes  . Last attempt to quit: 11/18/1993  . Years since quitting: 24.4  Smokeless Tobacco Never Used    Labs: Recent Chemical engineer    Labs for ITP Cardiac and Pulmonary Rehab Latest Ref Rng & Units 05/27/2008 12/28/2016   Hemoglobin A1c 4.8 - 5.6 % - 5.0   TCO2 - 23 -      Capillary Blood Glucose: Lab Results   Component Value Date   GLUCAP 96 12/30/2016   GLUCAP 97 12/30/2016   GLUCAP 132 (H) 12/29/2016   GLUCAP 140 (H) 12/29/2016   GLUCAP 123 (H) 12/29/2016     Pulmonary Assessment Scores: Pulmonary Assessment Scores    Row Name 01/15/18 1746 01/23/18 0704       ADL UCSD   ADL Phase  Entry  Entry    SOB Score total  49  -      CAT Score   CAT Score  20 Entry  -      mMRC Score   mMRC Score  -  0       Pulmonary Function Assessment: Pulmonary Function Assessment - 01/12/18 1453      Breath   Bilateral Breath Sounds  Clear    Shortness of Breath  Limiting activity;Yes       Exercise Target Goals:    Exercise Program Goal: Individual exercise prescription set using results from initial 6 min walk test and THRR while considering  patient's activity barriers and safety.    Exercise Prescription Goal: Initial exercise prescription builds to 30-45 minutes a day of aerobic activity, 2-3 days per week.  Home exercise guidelines will be given to patient during program as part of exercise prescription that the participant will acknowledge.  Activity Barriers & Risk Stratification: Activity Barriers & Cardiac Risk Stratification - 01/12/18 1436      Activity Barriers & Cardiac Risk Stratification   Activity Barriers  Shortness of Breath;Deconditioning       6 Minute Walk: 6 Minute Walk    Row Name 01/23/18 0700         6 Minute Walk   Phase  Initial     Distance  800 feet     Walk Time  - 5 minutes 10 seconds     # of Rest Breaks  1 stopped by ep due to low sat     MPH  1.51     METS  2.15     RPE  13     Perceived Dyspnea   2     Symptoms  Yes (comment)     Comments  used wheelchair and forehead probe-very slow o2 recovery     Resting HR  68 bpm     Resting BP  162/78     Resting Oxygen Saturation   91 %     Exercise Oxygen Saturation  during 6 min walk  78 %     Max Ex. HR  137 bpm     Max Ex. BP  180/64  Interval HR   1 Minute HR  100     2  Minute HR  118     3 Minute HR  130     4 Minute HR  132     5 Minute HR  132     6 Minute HR  137     2 Minute Post HR  110     Interval Heart Rate?  Yes       Interval Oxygen   Interval Oxygen?  Yes     Baseline Oxygen Saturation %  91 %     1 Minute Oxygen Saturation %  95 %     1 Minute Liters of Oxygen  10 L     2 Minute Oxygen Saturation %  84 %     2 Minute Liters of Oxygen  10 L     3 Minute Oxygen Saturation %  81 %     3 Minute Liters of Oxygen  15 L     4 Minute Oxygen Saturation %  85 % rest break by EP     4 Minute Liters of Oxygen  15 L     5 Minute Oxygen Saturation %  85 %     5 Minute Liters of Oxygen  15 L     6 Minute Oxygen Saturation %  78 %     6 Minute Liters of Oxygen  15 L     2 Minute Post Oxygen Saturation %  82 % did not recover to 91% until 4 minutes post 66mt     2 Minute Post Liters of Oxygen  10 L        Oxygen Initial Assessment: Oxygen Initial Assessment - 01/23/18 0703      Initial 6 min Walk   Oxygen Used  Continuous;E-Tanks    Liters per minute  15      Program Oxygen Prescription   Program Oxygen Prescription  Continuous;E-Tanks    Liters per minute  -- 10-15    Comments  Will get guidlines from Dr. AElsworth Sohofor O2 Sat and liter flow       Oxygen Re-Evaluation: Oxygen Re-Evaluation    Row Name 01/26/18 1346 02/20/18 1045 03/23/18 1653 04/21/18 0744       Program Oxygen Prescription   Program Oxygen Prescription  Continuous;E-Tanks  Continuous;E-Tanks  Continuous;E-Tanks  Continuous;E-Tanks    Liters per minute  -  15 NRB  15 NRB  15    Comments  -  Patient needs to stay above 90%--will try 25 liters  -  Patient needs to stay above 90%--will try 25 liters      Home Oxygen   Home Oxygen Device  -  Portable Concentrator;Home Concentrator;E-Tanks  Portable Concentrator;Home Concentrator;E-Tanks  Portable Concentrator;Home Concentrator;E-Tanks    Sleep Oxygen Prescription  -  Continuous  Continuous  Continuous    Liters per minute  -   _0 Home Exercise Oxygen Prescription  -  Continuous Exercise at home is not encouraged at this time.  None Not exercising at home due to high liter flow needs  None    Liters per minute  -  15  -  15    Home at Rest Exercise Oxygen Prescription  -  Continuous  Continuous  Continuous    Liters per minute  -  _1 Compliance with Home Oxygen Use  -  Yes  Yes  Yes      Goals/Expected Outcomes   Short Term Goals  -  To learn and exhibit compliance with exercise, home and travel O2 prescription;To learn and understand importance of monitoring SPO2 with pulse oximeter and demonstrate accurate use of the pulse oximeter.;To learn and understand importance of maintaining oxygen saturations>88%;To learn and demonstrate proper pursed lip breathing techniques or other breathing techniques.;To learn and demonstrate proper use of respiratory medications  To learn and exhibit compliance with exercise, home and travel O2 prescription;To learn and understand importance of monitoring SPO2 with pulse oximeter and demonstrate accurate use of the pulse oximeter.;To learn and understand importance of maintaining oxygen saturations>88%;To learn and demonstrate proper pursed lip breathing techniques or other breathing techniques.;To learn and demonstrate proper use of respiratory medications  To learn and exhibit compliance with exercise, home and travel O2 prescription;To learn and understand importance of monitoring SPO2 with pulse oximeter and demonstrate accurate use of the pulse oximeter.;To learn and understand importance of maintaining oxygen saturations>88%;To learn and demonstrate proper pursed lip breathing techniques or other breathing techniques.;To learn and demonstrate proper use of respiratory medications    Long  Term Goals  -  Exhibits compliance with exercise, home and travel O2 prescription;Verbalizes importance of monitoring SPO2 with pulse oximeter and return demonstration;Maintenance of O2  saturations>88%;Exhibits proper breathing techniques, such as pursed lip breathing or other method taught during program session;Compliance with respiratory medication;Demonstrates proper use of MDI's  Exhibits compliance with exercise, home and travel O2 prescription;Verbalizes importance of monitoring SPO2 with pulse oximeter and return demonstration;Maintenance of O2 saturations>88%;Exhibits proper breathing techniques, such as pursed lip breathing or other method taught during program session;Compliance with respiratory medication;Demonstrates proper use of MDI's  Exhibits compliance with exercise, home and travel O2 prescription;Verbalizes importance of monitoring SPO2 with pulse oximeter and return demonstration;Maintenance of O2 saturations>88%;Exhibits proper breathing techniques, such as pursed lip breathing or other method taught during program session;Compliance with respiratory medication;Demonstrates proper use of MDI's    Goals/Expected Outcomes  -  compliance  Patient to maintain compliance  Patient to maintain compliance       Oxygen Discharge (Final Oxygen Re-Evaluation): Oxygen Re-Evaluation - 04/21/18 0744      Program Oxygen Prescription   Program Oxygen Prescription  Continuous;E-Tanks    Liters per minute  15    Comments  Patient needs to stay above 90%--will try 25 liters      Home Oxygen   Home Oxygen Device  Portable Concentrator;Home Concentrator;E-Tanks    Sleep Oxygen Prescription  Continuous    Liters per minute  6    Home Exercise Oxygen Prescription  None    Liters per minute  15    Home at Rest Exercise Oxygen Prescription  Continuous    Liters per minute  6    Compliance with Home Oxygen Use  Yes      Goals/Expected Outcomes   Short Term Goals  To learn and exhibit compliance with exercise, home and travel O2 prescription;To learn and understand importance of monitoring SPO2 with pulse oximeter and demonstrate accurate use of the pulse oximeter.;To learn and  understand importance of maintaining oxygen saturations>88%;To learn and demonstrate proper pursed lip breathing techniques or other breathing techniques.;To learn and demonstrate proper use of respiratory medications    Long  Term Goals  Exhibits compliance with exercise, home and travel O2 prescription;Verbalizes importance of monitoring SPO2 with pulse oximeter and return demonstration;Maintenance of O2 saturations>88%;Exhibits proper breathing techniques, such as pursed lip breathing or other method  taught during program session;Compliance with respiratory medication;Demonstrates proper use of MDI's    Goals/Expected Outcomes  Patient to maintain compliance       Initial Exercise Prescription: Initial Exercise Prescription - 01/23/18 0700      Date of Initial Exercise RX and Referring Provider   Date  01/22/18    Referring Provider  Dr. Elsworth Soho      Oxygen   Oxygen  Continuous    Liters  -- 10-15      Recumbant Bike   Level  2    Watts  10    Minutes  17      NuStep   Level  2    SPM  80    Minutes  17    METs  1.5      Track   Laps  5    Minutes  17      Prescription Details   Frequency (times per week)  2    Duration  Progress to 45 minutes of aerobic exercise without signs/symptoms of physical distress      Intensity   THRR 40-80% of Max Heartrate  54-107    Ratings of Perceived Exertion  11-13    Perceived Dyspnea  0-4      Progression   Progression  Continue progressive overload as per policy without signs/symptoms or physical distress.      Resistance Training   Training Prescription  Yes    Weight  orange bands    Reps  10-15       Perform Capillary Blood Glucose checks as needed.  Exercise Prescription Changes:  Exercise Prescription Changes    Row Name 02/05/18 1300 02/17/18 1231 03/03/18 1200 03/31/18 1200 04/14/18 1200     Response to Exercise   Blood Pressure (Admit)  144/52  122/58  136/62  124/64  124/74   Blood Pressure (Exercise)  178/72   146/50  138/70  144/58  164/64   Blood Pressure (Exit)  128/70  128/58  124/70  120/60  130/80   Heart Rate (Admit)  65 bpm  74 bpm  66 bpm  71 bpm  75 bpm   Heart Rate (Exercise)  93 bpm  92 bpm  87 bpm  103 bpm  104 bpm   Heart Rate (Exit)  68 bpm  81 bpm  61 bpm  99 bpm  67 bpm   Oxygen Saturation (Admit)  92 %  98 %  96 %  99 %  99 %   Oxygen Saturation (Exercise)  88 %  89 %  88 %  88 %  80 %   Oxygen Saturation (Exit)  96 %  100 %  97 %  100 %  97 %   Rating of Perceived Exertion (Exercise)  _0 Perceived Dyspnea (Exercise)  2  1  0  1  1   Duration  Progress to 45 minutes of aerobic exercise without signs/symptoms of physical distress  Progress to 45 minutes of aerobic exercise without signs/symptoms of physical distress  Progress to 45 minutes of aerobic exercise without signs/symptoms of physical distress  Progress to 45 minutes of aerobic exercise without signs/symptoms of physical distress  Progress to 45 minutes of aerobic exercise without signs/symptoms of physical distress   Intensity  Other (comment)  Other (comment)  Other (comment)  Other (comment)  Other (comment)     Progression   Progression  Continue to progress workloads  to maintain intensity without signs/symptoms of physical distress. 40-80% HRR  Continue to progress workloads to maintain intensity without signs/symptoms of physical distress. 40-80% HRR  Continue to progress workloads to maintain intensity without signs/symptoms of physical distress. 40-80% HRR  Continue to progress workloads to maintain intensity without signs/symptoms of physical distress. 40-80% HRR  Continue to progress workloads to maintain intensity without signs/symptoms of physical distress. 40-80% HRR     Resistance Training   Training Prescription  Yes  Yes  Yes  Yes  Yes   Weight  orange bands  orange bands  orange bands  orange bands  orange bands   Reps  10-15  10-15  10-15  10-15  10-15   Time  10 Minutes  10 Minutes  10  Minutes  10 Minutes  10 Minutes     Oxygen   Oxygen  Continuous  Continuous  Continuous  Continuous  Continuous   Liters  15 L, 100% NRB  15 L, 100% NRB  15 L, 100% NRB  15 L, 100% NRB  15 L, 100% NRB     Recumbant Bike   Level  2.5  2.5  -  2  2   Watts  10  10  -  -  -   Minutes  17  17  -  17  17     NuStep   Level  _0 SPM  80  80  80  80  80   Minutes  17  34  34  34  34   METs  2.4  2.4  2.2  1.9  1.9      Exercise Comments:   Exercise Goals and Review:  Exercise Goals    Row Name 01/12/18 1437             Exercise Goals   Increase Physical Activity  Yes       Intervention  Provide advice, education, support and counseling about physical activity/exercise needs.;Develop an individualized exercise prescription for aerobic and resistive training based on initial evaluation findings, risk stratification, comorbidities and participant's personal goals.       Expected Outcomes  Short Term: Attend rehab on a regular basis to increase amount of physical activity.;Long Term: Add in home exercise to make exercise part of routine and to increase amount of physical activity.;Long Term: Exercising regularly at least 3-5 days a week.       Increase Strength and Stamina  Yes       Intervention  Provide advice, education, support and counseling about physical activity/exercise needs.;Develop an individualized exercise prescription for aerobic and resistive training based on initial evaluation findings, risk stratification, comorbidities and participant's personal goals.       Expected Outcomes  Short Term: Increase workloads from initial exercise prescription for resistance, speed, and METs.;Short Term: Perform resistance training exercises routinely during rehab and add in resistance training at home;Long Term: Improve cardiorespiratory fitness, muscular endurance and strength as measured by increased METs and functional capacity (6MWT)       Able to understand and use rate  of perceived exertion (RPE) scale  Yes       Intervention  Provide education and explanation on how to use RPE scale       Expected Outcomes  Short Term: Able to use RPE daily in rehab to express subjective intensity level;Long Term:  Able to use RPE to guide intensity level when exercising independently  Able to understand and use Dyspnea scale  Yes       Intervention  Provide education and explanation on how to use Dyspnea scale       Expected Outcomes  Short Term: Able to use Dyspnea scale daily in rehab to express subjective sense of shortness of breath during exertion;Long Term: Able to use Dyspnea scale to guide intensity level when exercising independently       Knowledge and understanding of Target Heart Rate Range (THRR)  Yes       Intervention  Provide education and explanation of THRR including how the numbers were predicted and where they are located for reference       Expected Outcomes  Short Term: Able to state/look up THRR;Short Term: Able to use daily as guideline for intensity in rehab;Long Term: Able to use THRR to govern intensity when exercising independently       Understanding of Exercise Prescription  Yes       Intervention  Provide education, explanation, and written materials on patient's individual exercise prescription       Expected Outcomes  Short Term: Able to explain program exercise prescription;Long Term: Able to explain home exercise prescription to exercise independently          Exercise Goals Re-Evaluation : Exercise Goals Re-Evaluation    Row Name 01/26/18 1346 02/20/18 1055 03/23/18 1655 04/21/18 0744       Exercise Goal Re-Evaluation   Exercise Goals Review  -  Increase Physical Activity;Able to understand and use rate of perceived exertion (RPE) scale;Knowledge and understanding of Target Heart Rate Range (THRR);Understanding of Exercise Prescription;Increase Strength and Stamina;Able to understand and use Dyspnea scale  Increase Physical  Activity;Able to understand and use rate of perceived exertion (RPE) scale;Knowledge and understanding of Target Heart Rate Range (THRR);Understanding of Exercise Prescription;Increase Strength and Stamina;Able to understand and use Dyspnea scale  Increase Physical Activity;Able to understand and use rate of perceived exertion (RPE) scale;Knowledge and understanding of Target Heart Rate Range (THRR);Understanding of Exercise Prescription;Increase Strength and Stamina;Able to understand and use Dyspnea scale    Comments  Patient's first day of exercise will be 01/29/18  Patient is currently only doing seated exercise due to her desaturations. Will increase her to 25 liters with NRB next time she is here to see if this will allow her to walk on the track while keeping her o2 sats above 90%  Patient is currently only doing seated exercise due to her desaturations. Not exercising at home at this point due to high liter flow needs.  Patient is currently only doing seated exercise due to her desaturations. Patient is going to walk today on potentially 25 liters with NRB to see what her 02 sats are. Patient is eager to walk. Not exercising at home at this point due to high liter flow needs.    Expected Outcomes  -  Through exercise at rehab and at home, patient will increase physical capacity and be able to carry out ADL's with ease at home. Patient will also gain the confidence and knowledge to adhere to an exercise regime at home.  Through exercise at rehab and at home, patient will increase physical capacity and be able to carry out ADL's with ease at home. Patient will also gain the confidence and knowledge to adhere to an exercise regime at home.  Through exercise at rehab and at home, the patient will decrease shortness of breath with daily activities and feel confident in carrying out an  exercise regime at home.        Discharge Exercise Prescription (Final Exercise Prescription Changes): Exercise Prescription  Changes - 04/14/18 1200      Response to Exercise   Blood Pressure (Admit)  124/74    Blood Pressure (Exercise)  164/64    Blood Pressure (Exit)  130/80    Heart Rate (Admit)  75 bpm    Heart Rate (Exercise)  104 bpm    Heart Rate (Exit)  67 bpm    Oxygen Saturation (Admit)  99 %    Oxygen Saturation (Exercise)  80 %    Oxygen Saturation (Exit)  97 %    Rating of Perceived Exertion (Exercise)  11    Perceived Dyspnea (Exercise)  1    Duration  Progress to 45 minutes of aerobic exercise without signs/symptoms of physical distress    Intensity  Other (comment)      Progression   Progression  Continue to progress workloads to maintain intensity without signs/symptoms of physical distress. 40-80% HRR      Resistance Training   Training Prescription  Yes    Weight  orange bands    Reps  10-15    Time  10 Minutes      Oxygen   Oxygen  Continuous    Liters  15 L, 100% NRB      Recumbant Bike   Level  2    Minutes  17      NuStep   Level  4    SPM  80    Minutes  34    METs  1.9       Nutrition:  Target Goals: Understanding of nutrition guidelines, daily intake of sodium <1547m, cholesterol <2092m calories 30% from fat and 7% or less from saturated fats, daily to have 5 or more servings of fruits and vegetables.  Biometrics: Pre Biometrics - 01/12/18 1459      Pre Biometrics   Grip Strength  30 kg        Nutrition Therapy Plan and Nutrition Goals: Nutrition Therapy & Goals - 02/12/18 1224      Nutrition Therapy   Diet  Heart Healthy      Personal Nutrition Goals   Nutrition Goal  Describe the benefit of including fruits, vegetables, whole grains, and low-fat dairy products in a healthy meal plan.      Intervention Plan   Intervention  Prescribe, educate and counsel regarding individualized specific dietary modifications aiming towards targeted core components such as weight, hypertension, lipid management, diabetes, heart failure and other comorbidities.     Expected Outcomes  Short Term Goal: Understand basic principles of dietary content, such as calories, fat, sodium, cholesterol and nutrients.;Long Term Goal: Adherence to prescribed nutrition plan.       Nutrition Assessments: Nutrition Assessments - 02/12/18 1224      Rate Your Plate Scores   Pre Score  58       Nutrition Goals Re-Evaluation:   Nutrition Goals Discharge (Final Nutrition Goals Re-Evaluation):   Psychosocial: Target Goals: Acknowledge presence or absence of significant depression and/or stress, maximize coping skills, provide positive support system. Participant is able to verbalize types and ability to use techniques and skills needed for reducing stress and depression.  Initial Review & Psychosocial Screening: Initial Psych Review & Screening - 01/12/18 1456      Initial Review   Current issues with  None Identified      Family Dynamics   Good Support System?  Yes      Barriers   Psychosocial barriers to participate in program  There are no identifiable barriers or psychosocial needs.      Screening Interventions   Interventions  Encouraged to exercise       Quality of Life Scores:  Scores of 19 and below usually indicate a poorer quality of life in these areas.  A difference of  2-3 points is a clinically meaningful difference.  A difference of 2-3 points in the total score of the Quality of Life Index has been associated with significant improvement in overall quality of life, self-image, physical symptoms, and general health in studies assessing change in quality of life.   PHQ-9: Recent Review Flowsheet Data    Depression screen [facility] 2/9 01/12/2018   Decreased Interest 0   Down, Depressed, Hopeless 0   PHQ - 2 Score 0     Interpretation of Total Score  Total Score Depression Severity:  1-4 = Minimal depression, 5-9 = Mild depression, 10-14 = Moderate depression, 15-19 = Moderately severe depression, 20-27 = Severe depression   Psychosocial  Evaluation and Intervention: Psychosocial Evaluation - 04/20/18 1639      Psychosocial Evaluation & Interventions   Interventions  Encouraged to exercise with the program and follow exercise prescription    Comments  Pt interacts well with fellow participants and staff.  Pt offers good input during education classes.    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      Expected Outcomes  patient will remain free from psychosocial barriers to participation in pulmonary rehab   patient will remain free from psychosocial barriers to participation in pulmonary rehab       Interventions  Encouraged to attend Pulmonary Rehabilitation for the exercise  Encouraged to attend Pulmonary Rehabilitation for the exercise      Continue Psychosocial Services   Follow up required by staff  Follow up required by staff         Psychosocial Discharge (Final Psychosocial Re-Evaluation): Psychosocial Re-Evaluation - 03/23/18 1309      Psychosocial Re-Evaluation   Current issues with  None Identified    Comments  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    Expected Outcomes  patient will remain free from psychosocial barriers to participation in pulmonary rehab     Interventions  Encouraged to attend Pulmonary Rehabilitation for the exercise     Continue Psychosocial Services   Follow up required by staff       Education: Education Goals: Education classes will be provided on a weekly basis, covering required topics. Participant will state understanding/return demonstration of topics presented.  Learning Barriers/Preferences: Learning Barriers/Preferences - 01/12/18 1453      Learning Barriers/Preferences   Learning Barriers  None    Learning Preferences  Written Material;Group Instruction;Verbal Instruction;Skilled Demonstration       Education Topics: Risk Factor Reduction:  -Group instruction that is supported by a PowerPoint presentation. Instructor discusses the definition of a risk factor, different risk factors for pulmonary disease, and how the heart and lungs work together.     Nutrition for Pulmonary Patient:  -Group instruction provided by PowerPoint slides, verbal discussion, and written materials to support subject matter. The instructor gives an explanation and review of healthy diet recommendations, which includes a discussion on weight management, recommendations for fruit and vegetable consumption, as well as protein, fluid, caffeine, fiber, sodium, sugar, and alcohol. Tips for eating when patients are short of breath are discussed.   PULMONARY REHAB CHRONIC OBSTRUCTIVE PULMONARY DISEASE from 04/16/2018 in [place]  Date  04/02/18  Educator  Parke Simmers  Instruction Review Code  2- Demonstrated Understanding      Pursed Lip Breathing:  -Group instruction that is supported by demonstration and informational handouts. Instructor discusses the benefits of pursed lip and diaphragmatic breathing and detailed demonstration on how to preform both.     Oxygen Safety:  -Group instruction provided by PowerPoint, verbal discussion, and written material to support subject matter. There is an overview of "What is Oxygen" and "Why do we need it".  Instructor also reviews how to create a safe  environment for oxygen use, the importance of using oxygen as prescribed, and the risks of noncompliance. There is a brief discussion on traveling with oxygen and resources the patient may utilize.   PULMONARY REHAB CHRONIC OBSTRUCTIVE PULMONARY DISEASE from 04/16/2018 in [place]  Date  03/05/18  Educator  [place]  Instruction Review Code  2- Demonstrated Understanding      Oxygen Equipment:  -Group instruction provided by [facility] Staff utilizing handouts, written materials, and equipment demonstrations.   PULMONARY REHAB CHRONIC OBSTRUCTIVE PULMONARY DISEASE from 04/16/2018 in [place]  Date  03/19/18  Educator  [place]  Instruction Review Code  2- Demonstrated Understanding      Signs and Symptoms:  -Group instruction provided by written material and verbal discussion to support subject matter. Warning signs and symptoms of infection, stroke, and heart attack are reviewed and when to call the physician/911 reinforced. Tips for preventing the spread of infection discussed.   Advanced Directives:  -Group instruction provided by verbal instruction and written material to support subject matter. Instructor reviews Advanced Directive laws and proper instruction for filling out document.   Pulmonary Video:  -Group video education that reviews the importance of medication and oxygen compliance, exercise, good nutrition, pulmonary hygiene, and pursed lip and diaphragmatic breathing for the pulmonary patient.   Exercise for the Pulmonary Patient:  -Group instruction that is supported by a PowerPoint presentation. Instructor discusses benefits of exercise, core components of exercise, frequency, duration, and intensity of an exercise routine, importance of utilizing pulse oximetry during exercise, safety while exercising, and options of places to exercise outside of rehab.     PULMONARY REHAB CHRONIC OBSTRUCTIVE PULMONARY  DISEASE from 04/16/2018 in [place]  Date  03/26/18  Educator  EP  Instruction Review Code  1- Verbalizes Understanding      Pulmonary Medications:  -Verbally interactive group education provided by instructor with focus on inhaled medications and proper administration.   PULMONARY REHAB CHRONIC OBSTRUCTIVE PULMONARY DISEASE from 04/16/2018 in [place]  Date  02/12/18  Educator  Pharm  Instruction Review Code  2- Demonstrated Understanding      Anatomy and Physiology of the Respiratory System and Intimacy:  -Group instruction provided by PowerPoint, verbal discussion, and written material to support subject matter. Instructor reviews respiratory cycle and anatomical components of the respiratory system and their functions. Instructor also reviews differences in obstructive and restrictive respiratory diseases with examples of each. Intimacy, Sex, and Sexuality differences are reviewed with a discussion on how relationships can change when diagnosed with pulmonary disease. Common sexual concerns are reviewed.   PULMONARY REHAB CHRONIC OBSTRUCTIVE PULMONARY DISEASE from 04/16/2018 in [place]  Date  04/16/18  Educator  rn  Instruction Review Code  2- Demonstrated Understanding      MD DAY -A group question and answer session with a medical doctor that allows participants to ask questions that relate to their pulmonary disease state.   OTHER EDUCATION -Group or individual verbal, written, or video instructions that support the educational goals of the pulmonary rehab program.   Holiday Eating Survival Tips:  -Group instruction provided by PowerPoint slides, verbal discussion, and written materials to support subject matter. The instructor gives patients tips, tricks, and techniques to help them not only survive but enjoy the holidays despite the onslaught of food that accompanies the  holidays.   Knowledge Questionnaire Score: Knowledge Questionnaire Score - 01/15/18 1746      Knowledge Questionnaire Score   Pre Score  13/18       Core Components/Risk Factors/Patient Goals at Admission: Personal Goals and Risk Factors at Admission - 01/12/18 1456      Core Components/Risk Factors/Patient Goals on Admission   Improve shortness of breath with ADL's  Yes    Intervention  Provide education, individualized exercise plan and daily activity instruction to help decrease symptoms of SOB with activities of daily living.    Expected Outcomes  Short Term: Improve cardiorespiratory fitness to achieve a reduction of symptoms when performing ADLs;Long Term: Be able to perform more ADLs without symptoms or delay the onset of symptoms       Core Components/Risk Factors/Patient Goals Review:  Goals and Risk Factor Review    Row Name 02/23/18 1105 03/23/18 1302 04/20/18 1635         Core Components/Risk Factors/Patient Goals Review   Personal Goals Review  Improve shortness of breath with ADL's  Improve shortness of breath with ADL's;Develop more efficient breathing techniques such as purse lipped breathing and diaphragmatic breathing and practicing self-pacing with activity.  Improve shortness of breath with ADL's;Develop more efficient breathing techniques such as purse lipped breathing and diaphragmatic breathing and practicing self-pacing with activity.     Review  patient got off to a slow start in pulmonary rehab related to her inability to stay oxygenated with the maximum amount of oxygen we could supply to her. in collaboration with her pulmonologist it was decided that she would not walk during rehab related to continued desaturations below adjusted parameters on maximum flow. patient does tolerated seated exercise and increased workloads on equiptment. she has not seen an improvement in her shortness of breath as of yet. will continue to monitor and hope to see progression  towards improvement in shortness of breath over the next 30 days.  [name] is doing OK in pulmonary rehab. she was seen by our medical director during the last 30 days and it was identified that the patient was in need of a HF consult for her [place]. she subsiquently saw her primary pulmonologist  who facilitated this referral and patient is scheduled to see Dr. Haroldine Laws in June. she remains on 100% NRB for exercise however if she is not walking we maybe able to do a trial of O2 using a venti mask at 50%. During ambulation she continues to desaturate significantly unless she is on 25L however our current order is only for 15. will wait until patient has appointment with HF clinic for more guided direction. she states she enjoys PR and believes her shortness of breath has improved during her admission. she has had a minor incidences of epitaxis that has prohibited her from exercise. That has been resolved with a visit to the ENT and directions on nasal hydration and use of Afrin for bleeding. will continued to monitor towards pulmonary rehab goals during the next 30 days.  [name] is doing good in pulmonary rehab. she was seen by our medical director during the last 30 days and it was identified that the patient was in need of a HF consult for her [place]. she subsiquently saw her primary pulmonologist who facilitated this referral and patient saw Dr. Haroldine Laws in June. she remains on 100% NRB for exercise however if she is not walking we maybe able to do a trial of O2 using a venti mask at 50%. During ambulation she continues to desaturate significantly unless she is on 25L however our current order is only for 15. will wait until patient has appointment with HF clinic for more guided direction her appointment is scheduled for 6/24.  . she states she enjoys PR and believes her shortness of breath has improved during her admission.  [name] continues to help out at the [employer] and has great joy from this.  We will continue to  monitor towards pulmonary rehab goals during the next 30 days.     Expected Outcomes  see admission expected outcomes.  see admission expected outcomes.  see admission expected outcomes.        Core Components/Risk Factors/Patient Goals at Discharge (Final Review):  Goals and Risk Factor Review - 04/20/18 1635      Core Components/Risk Factors/Patient Goals Review   Personal Goals Review  Improve shortness of breath with ADL's;Develop more efficient breathing techniques such as purse lipped breathing and diaphragmatic breathing and practicing self-pacing with activity.    Review  [name] is doing good in pulmonary rehab. she was seen by our medical director during the last 30 days and it was identified that the patient was in need of a HF consult for her [place]. she subsiquently saw her primary pulmonologist who facilitated this referral and patient saw Dr. Haroldine Laws in June. she remains on 100% NRB for exercise however if she is not walking we maybe able to do a trial of O2 using a venti mask at 50%. During ambulation she continues to desaturate significantly unless she is on 25L however our current order is only for 15. will wait until patient has appointment with HF clinic for more guided direction her appointment is scheduled for 6/24.  . she states she enjoys PR and believes her shortness of breath has improved during her admission.  [name] continues to help out at the [employer] and has great joy from this.  We will continue to monitor towards pulmonary rehab goals during the next 30 days.    Expected Outcomes  see admission expected outcomes.       ITP Comments: ITP Comments    Row Name 03/23/18 1257 04/22/18 1051  ITP Comments  Dr Jennet Maduro, Medical Director  Dr Jennet Maduro, Medical Director         Comments: Pt has completed 19 exercise session and 1 incomplete session due to bloody nose. [name], BSN Cardiac and Training and development officer

## 2018-04-20 NOTE — Progress Notes (Signed)
[name] 84 y.o female  56 day Psychosocial Note  Patient psychosocial assessment reveals no barriers to participation in Pulmonary Rehab. Patient has had one physical barriers to participation over the last 30 days - 1 episode of epistaxis. This has been treated by an MD and has not been an issue since recommended treatment.  Patient does continue to exhibit positive coping skills to deal with any psychosocial concerns that may arise. Offered emotional support and reassurance. Patient does feel she is making progress toward Pulmonary Rehab goals. Patient reports her health and activity level has not improved significantly in the past 30 days however she has gained a lot of knowledge regarding how to safely exert herself, when to take rest breaks, self monitoring, when to notify the MD and oxygen compliance. She is also no longer cooking on a gas stove with open flame while wearing her high flow oxygen. Her family made arrangements for other means of cooking.  Patient states family/friends have noticed changes in her mood and confidence. Patient reports  feeling positive about current and projected progression in Pulmonary Rehab. 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 Pt has a new patient appointment on 6/21 with Dr. Haroldine [name] for management of her [place]. After reviewing the patient's treatment plan, the patient is making progress toward Pulmonary Rehab goals. Patient's rate of progress toward rehab goals is fair. Plan of action to help patient continue to work towards rehab goals include increasing workloads on equipment as tolerated in order to increase patients stamina and strength. Will continue to monitor and evaluate progress toward psychosocial goal(s).  Goal(s) in progress: Improved management of anxiety related to inability to perform tasks with high oxygen levels.  Improved coping skills  Help patient work toward returning to meaningful activities that improve  patient's QOL and are attainable with patient's lung disease as well as continued understanding of her disease process. [name], BSN Cardiac and Training and development officer

## 2018-04-21 ENCOUNTER — Encounter (HOSPITAL_COMMUNITY)
Admission: RE | Admit: 2018-04-21 | Discharge: 2018-04-21 | Disposition: A | Discharge status: Home / Self Care | Payer: Medicare Other | Source: Ambulatory Visit | Attending: Pulmonary Disease | Admitting: Pulmonary Disease

## 2018-04-21 DIAGNOSIS — J449 Chronic obstructive pulmonary disease, unspecified: Secondary | ICD-10-CM | POA: Insufficient documentation

## 2018-04-21 DIAGNOSIS — I440 Atrioventricular block, first degree: Secondary | ICD-10-CM | POA: Diagnosis not present

## 2018-04-21 DIAGNOSIS — E785 Hyperlipidemia, unspecified: Secondary | ICD-10-CM | POA: Diagnosis not present

## 2018-04-21 DIAGNOSIS — E0590 Thyrotoxicosis, unspecified without thyrotoxic crisis or storm: Secondary | ICD-10-CM | POA: Insufficient documentation

## 2018-04-21 DIAGNOSIS — I739 Peripheral vascular disease, unspecified: Secondary | ICD-10-CM | POA: Insufficient documentation

## 2018-04-21 DIAGNOSIS — Z86010 Personal history of colonic polyps: Secondary | ICD-10-CM | POA: Diagnosis not present

## 2018-04-21 DIAGNOSIS — I110 Hypertensive heart disease with heart failure: Secondary | ICD-10-CM | POA: Diagnosis not present

## 2018-04-21 DIAGNOSIS — M1990 Unspecified osteoarthritis, unspecified site: Secondary | ICD-10-CM | POA: Diagnosis not present

## 2018-04-21 DIAGNOSIS — Z79899 Other long term (current) drug therapy: Secondary | ICD-10-CM | POA: Insufficient documentation

## 2018-04-21 DIAGNOSIS — Z87891 Personal history of nicotine dependence: Secondary | ICD-10-CM | POA: Insufficient documentation

## 2018-04-21 DIAGNOSIS — I5032 Chronic diastolic (congestive) heart failure: Secondary | ICD-10-CM | POA: Insufficient documentation

## 2018-04-21 DIAGNOSIS — Z9889 Other specified postprocedural states: Secondary | ICD-10-CM | POA: Insufficient documentation

## 2018-04-21 DIAGNOSIS — R0602 Shortness of breath: Secondary | ICD-10-CM | POA: Diagnosis not present

## 2018-04-21 DIAGNOSIS — J8410 Pulmonary fibrosis, unspecified: Secondary | ICD-10-CM | POA: Insufficient documentation

## 2018-04-21 NOTE — Progress Notes (Signed)
Daily Session Note  Patient Details  Name: [name] MRN: [id number] Date of Birth: 1929-03-26 Referring Provider:     Pulmonary Rehab Walk Test from 01/22/2018 in [place]  Referring Provider  Dr. Elsworth Soho      Encounter Date: 04/21/2018  Check In: Session Check In - 04/21/18 1221      Check-In   Location  [facility]    Staff Present  Su Hilt, MS, ACSM RCEP, Exercise Physiologist;Carlette Alanson, RN, BSN;Ellarose Brandi Ysidro Evert, Felipe Drone, RN, [place] physician immediately available to respond to emergencies  [facility] immediately available    Physician(s)   Dr. Jonnie Finner    Medication changes reported      No    Fall or balance concerns reported     No    Tobacco Cessation  No Change    Warm-up and Cool-down  Performed as group-led instruction    Resistance Training Performed  Yes    VAD Patient?  No      Pain Assessment   Currently in Pain?  No/denies    Multiple Pain Sites  No       Capillary Blood Glucose: No results found for this or any previous visit (from the past 24 hour(s)).    Social History   Tobacco Use  Smoking Status Former Smoker  . Packs/day: 1.00  . Years: 40.00  . Pack years: 40.00  . Types: Cigarettes  . Last attempt to quit: 11/18/1993  . Years since quitting: 24.4  Smokeless Tobacco Never Used    Goals Met:  Exercise tolerated well No report of cardiac concerns or symptoms Strength training completed today  Goals Unmet:  Not Applicable  Comments: Service time is from 1030 to 1210    Dr. Rush Farmer is Medical Director for Pulmonary Rehab at [facility]

## 2018-04-22 ENCOUNTER — Telehealth: Payer: Self-pay | Admitting: Pulmonary Disease

## 2018-04-22 MED ORDER — TADALAFIL 20 MG PO TABS: ORAL_TABLET | ORAL | 5 refills | Status: DC

## 2018-04-22 NOTE — Telephone Encounter (Signed)
I note that she has an appointment on 6/24 with advanced heart failure team that they will discuss right heart catheterization. Please ask her to stay on tadalafil until then-since she is tolerating this well. They can reassess pulmonary artery pressure on that visit and then decide on need to continue

## 2018-04-22 NOTE — Telephone Encounter (Signed)
Pt aware of recs.  rx refill sent to the pharmacy at pt request.  Nothing further needed.

## 2018-04-22 NOTE — Telephone Encounter (Signed)
Spoke with pt, she states the medication is not working for her. She thought the medication tadafinil is supposed to open the arteries to make her breathe better but has not seen a significant change. She is still SOB with exertion but in pulmonary rehab she doesn't feel SOB. She took the 15 days worth but didn't think it worked so she wanted to let RA know. Please advise.  Current Outpatient Medications on File Prior to Visit  Medication Sig Dispense Refill  . acetaminophen (TYLENOL) 500 MG tablet Take 1,000 mg by mouth at bedtime.     Marland Kitchen albuterol (PROVENTIL HFA;VENTOLIN HFA) 108 (90 Base) MCG/ACT inhaler Inhale 2 puffs into the lungs every 6 (six) hours as needed for wheezing or shortness of breath. 1 Inhaler 1  . Aloe-Sodium Chloride (AYR SALINE NASAL GEL NA) Place 1 application into the nose daily as needed (dryness).    Jearl Klinefelter ELLIPTA 62.5-25 MCG/INH AEPB INHALE 1 PUFF INTO THE LUNGS DAILY 60 each 5  . aspirin 81 MG tablet Take 81 mg by mouth daily.      . Carboxymethylcellul-Glycerin (LUBRICATING EYE DROPS OP) Apply 1 drop to eye daily as needed (dry eyes).    . Cyanocobalamin (B-12) 500 MCG TABS Take 500 mcg by mouth daily.    Marland Kitchen denosumab (PROLIA) 60 MG/ML SOLN injection Inject 60 mg into the skin every 6 (six) months. Administer in upper arm, thigh, or abdomen    . furosemide (LASIX) 20 MG tablet Take 1 tablet (20 mg total) by mouth daily. 90 tablet 1  . isosorbide mononitrate (IMDUR) 30 MG 24 hr tablet TAKE 1 TABLET(30 MG) BY MOUTH DAILY 90 tablet 2  . levothyroxine (SYNTHROID, LEVOTHROID) 137 MCG tablet Take 137 mcg by mouth daily.      Marland Kitchen losartan (COZAAR) 25 MG tablet Take 1 tablet (25 mg total) by mouth daily. 30 tablet 6  . metoprolol tartrate (LOPRESSOR) 50 MG tablet Take 1 tablet (50 mg total) by mouth 2 (two) times daily. 180 tablet 3  . multivitamin-lutein (OCUVITE-LUTEIN) CAPS capsule Take 1 capsule by mouth daily.    . nitroGLYCERIN (NITROSTAT) 0.4 MG SL tablet Place 0.4 mg under  the tongue every 5 (five) minutes as needed for chest pain (x 3 pills).    . OXYGEN 4 lpm with sleep and 6lpm all other times    . rosuvastatin (CRESTOR) 40 MG tablet Take 20 mg by mouth at bedtime.     . tadalafil (CIALIS) 20 MG tablet Take 1 tablet (20 mg total) by mouth daily as needed for up to 15 days for erectile dysfunction. 15 tablet 0   No current facility-administered medications on file prior to visit.    No Known Allergies

## 2018-04-23 ENCOUNTER — Encounter (HOSPITAL_COMMUNITY)
Admission: RE | Admit: 2018-04-23 | Discharge: 2018-04-23 | Disposition: A | Discharge status: Home / Self Care | Payer: Medicare Other | Source: Ambulatory Visit | Attending: Pulmonary Disease | Admitting: Pulmonary Disease

## 2018-04-23 ENCOUNTER — Telehealth (HOSPITAL_COMMUNITY): Payer: Self-pay | Admitting: *Deleted

## 2018-04-23 DIAGNOSIS — J449 Chronic obstructive pulmonary disease, unspecified: Secondary | ICD-10-CM | POA: Diagnosis not present

## 2018-04-23 DIAGNOSIS — M1990 Unspecified osteoarthritis, unspecified site: Secondary | ICD-10-CM | POA: Diagnosis not present

## 2018-04-23 DIAGNOSIS — Z9889 Other specified postprocedural states: Secondary | ICD-10-CM | POA: Diagnosis not present

## 2018-04-23 DIAGNOSIS — Z86010 Personal history of colonic polyps: Secondary | ICD-10-CM | POA: Diagnosis not present

## 2018-04-23 DIAGNOSIS — Z79899 Other long term (current) drug therapy: Secondary | ICD-10-CM | POA: Diagnosis not present

## 2018-04-23 DIAGNOSIS — E0590 Thyrotoxicosis, unspecified without thyrotoxic crisis or storm: Secondary | ICD-10-CM | POA: Diagnosis not present

## 2018-04-23 NOTE — Progress Notes (Signed)
Daily Session Note  Patient Details  Name: [name] MRN: [id number] Date of Birth: 1929-08-25 Referring Provider:     Pulmonary Rehab Walk Test from 01/22/2018 in [place]  Referring Provider  Dr. Elsworth Soho      Encounter Date: 04/23/2018  Check In: Session Check In - 04/23/18 1125      Check-In   Location  [facility]    Staff Present  Rodney Langton, RN;Carlette Wilber Oliphant, RN, BSN;Tram Wrenn, MS, ACSM RCEP, Exercise Physiologist;Annedrea Rosezella [place], RN, [facility]    Supervising physician immediately available to respond to emergencies  [facility] immediately available    Physician(s)  Dr. Tana Coast    Medication changes reported      No    Fall or balance concerns reported     No    Tobacco Cessation  No Change    Warm-up and Cool-down  Performed as group-led instruction    Resistance Training Performed  Yes    VAD Patient?  No      Pain Assessment   Currently in Pain?  No/denies    Multiple Pain Sites  No       Capillary Blood Glucose: No results found for this or any previous visit (from the past 24 hour(s)).    Social History   Tobacco Use  Smoking Status Former Smoker  . Packs/day: 1.00  . Years: 40.00  . Pack years: 40.00  . Types: Cigarettes  . Last attempt to quit: 11/18/1993  . Years since quitting: 24.4  Smokeless Tobacco Never Used    Goals Met:  Exercise tolerated well No report of cardiac concerns or symptoms Strength training completed today  Goals Unmet:  Not Applicable  Comments: Service time is from 10:30a to 12:30p    Dr. Rush Farmer is Medical Director for Pulmonary Rehab at [facility].

## 2018-04-23 NOTE — Progress Notes (Signed)
[name] presents to pulmonary rehab for her bi-weekly exercise session. I have completed her thirty day face to face review and determined that [name] is on track for meeting their pulmonary rehab goals. There are barriers identified that will prevent them from continuing their exercise in pulmonary rehab as prescribed.   Rush Farmer, M.D. [facility] Pulmonary/Critical Care Medicine. Pager: [phone]. After hours pager: [phone].

## 2018-04-23 NOTE — Telephone Encounter (Signed)
-----   Message from Wise sent at 04/23/2018  9:45 AM EDT ----- Regarding: FW: Pulmonary Rehab O2    ----- Message ----- From: Rigoberto Noel, MD Sent: 04/23/2018   9:27 AM To: Feliz Beam Divincenzo, RCEP Subject: RE: Pulmonary Rehab O2                         O2 saturation 80% acceptable during exercise.  Keep small bursts of exercise with rest. Not sure that we will be able to help her much beyond this Have started pulmonary hypertension medication but not sure whether this would be helpful   RA   ----- Message ----- From: Gaspar Bidding Sent: 04/23/2018   7:50 AM To: Rigoberto Noel, MD Subject: Pulmonary Rehab O2                             Good morning!  Just wanted you to be aware--[name] has not been walking in rehab due to her desaturations--she has only been doing seated exercises. We were waiting to see what her plan of treatment was going to be between you and the heart failure clinic. This past Tuesday I had her walk the track (using an assistive device) on 25 liters with a NRB. She only desaturated to 88%. Patient states that when she walks at home on 8 liters, there are times that she drops to 68%. Wanted you to be aware of what we were doing here. As you know, patient is still eager to live an active lifestyle (she has more energy than most of [place]) but is being held back by her 02 requirements.  Have a great day! [name]

## 2018-04-28 ENCOUNTER — Encounter (HOSPITAL_COMMUNITY)
Admission: RE | Admit: 2018-04-28 | Discharge: 2018-04-28 | Disposition: A | Discharge status: Home / Self Care | Payer: Medicare Other | Source: Ambulatory Visit | Attending: Pulmonary Disease | Admitting: Pulmonary Disease

## 2018-04-28 VITALS — Wt 154.1 lb

## 2018-04-28 DIAGNOSIS — Z9889 Other specified postprocedural states: Secondary | ICD-10-CM | POA: Diagnosis not present

## 2018-04-28 DIAGNOSIS — Z86010 Personal history of colonic polyps: Secondary | ICD-10-CM | POA: Diagnosis not present

## 2018-04-28 DIAGNOSIS — J449 Chronic obstructive pulmonary disease, unspecified: Secondary | ICD-10-CM

## 2018-04-28 DIAGNOSIS — E0590 Thyrotoxicosis, unspecified without thyrotoxic crisis or storm: Secondary | ICD-10-CM | POA: Diagnosis not present

## 2018-04-28 DIAGNOSIS — Z79899 Other long term (current) drug therapy: Secondary | ICD-10-CM | POA: Diagnosis not present

## 2018-04-28 DIAGNOSIS — M1990 Unspecified osteoarthritis, unspecified site: Secondary | ICD-10-CM | POA: Diagnosis not present

## 2018-04-28 NOTE — Progress Notes (Signed)
Daily Session Note  Patient Details  Name: [name] MRN: [id number] Date of Birth: 05-23-1929 Referring Provider:     Pulmonary Rehab Walk Test from 01/22/2018 in [place]  Referring Provider  Dr. Elsworth Soho      Encounter Date: 04/28/2018  Check In: Session Check In - 04/28/18 1030      Check-In   Location  [facility]    Staff Present  Rosebud Poles, RN, BSN;[name], MS, ACSM RCEP, Exercise Physiologist;Lisa Ysidro Evert, Felipe Drone, RN, MHA;Olinty Celesta Aver, MS, ACSM CEP, Exercise Physiologist;Carlette Wilber Oliphant, RN, BSN    Supervising physician immediately available to respond to emergencies  [facility] immediately available    Physician(s)  Dr. Tana Coast    Medication changes reported      No    Fall or balance concerns reported     No    Tobacco Cessation  No Change    Warm-up and Cool-down  Performed as group-led instruction    Resistance Training Performed  Yes    VAD Patient?  No      Pain Assessment   Currently in Pain?  No/denies    Multiple Pain Sites  No       Capillary Blood Glucose: No results found for this or any previous visit (from the past 24 hour(s)).  Exercise Prescription Changes - 04/28/18 1300      Response to Exercise   Blood Pressure (Admit)  158/74    Blood Pressure (Exercise)  140/68    Blood Pressure (Exit)  123/60    Heart Rate (Admit)  73 bpm    Heart Rate (Exercise)  100 bpm    Heart Rate (Exit)  91 bpm    Oxygen Saturation (Admit)  97 %    Oxygen Saturation (Exercise)  85 %    Oxygen Saturation (Exit)  89 %    Rating of Perceived Exertion (Exercise)  11    Perceived Dyspnea (Exercise)  1    Duration  Progress to 45 minutes of aerobic exercise without signs/symptoms of physical distress    Intensity  THRR unchanged      Progression   Progression  Continue to progress workloads to maintain intensity without signs/symptoms of physical distress.      Resistance Training   Training Prescription  Yes    Weight  orange bands    Reps  10-15    Time  10 Minutes      Oxygen   Oxygen  Continuous    Liters  25L, 100% NRB      NuStep   Level  4    SPM  80    Minutes  17    METs  2.1      Track   Laps  22    Minutes  34       Social History   Tobacco Use  Smoking Status Former Smoker  . Packs/day: 1.00  . Years: 40.00  . Pack years: 40.00  . Types: Cigarettes  . Last attempt to quit: 11/18/1993  . Years since quitting: 24.4  Smokeless Tobacco Never Used    Goals Met:  Exercise tolerated well Strength training completed today  Goals Unmet:  Not Applicable  Comments: Service time is from 1030 to 1225.    Dr. Rush Farmer is Medical Director for Pulmonary Rehab at [facility].

## 2018-04-30 ENCOUNTER — Encounter (HOSPITAL_COMMUNITY)
Admission: RE | Admit: 2018-04-30 | Discharge: 2018-04-30 | Disposition: A | Discharge status: Home / Self Care | Payer: Medicare Other | Source: Ambulatory Visit | Attending: Pulmonary Disease | Admitting: Pulmonary Disease

## 2018-04-30 DIAGNOSIS — M1990 Unspecified osteoarthritis, unspecified site: Secondary | ICD-10-CM | POA: Diagnosis not present

## 2018-04-30 DIAGNOSIS — Z86010 Personal history of colonic polyps: Secondary | ICD-10-CM | POA: Diagnosis not present

## 2018-04-30 DIAGNOSIS — J449 Chronic obstructive pulmonary disease, unspecified: Secondary | ICD-10-CM

## 2018-04-30 DIAGNOSIS — Z79899 Other long term (current) drug therapy: Secondary | ICD-10-CM | POA: Diagnosis not present

## 2018-04-30 DIAGNOSIS — E0590 Thyrotoxicosis, unspecified without thyrotoxic crisis or storm: Secondary | ICD-10-CM | POA: Diagnosis not present

## 2018-04-30 DIAGNOSIS — Z9889 Other specified postprocedural states: Secondary | ICD-10-CM | POA: Diagnosis not present

## 2018-04-30 NOTE — Progress Notes (Signed)
Daily Session Note  Patient Details  Name: [name] MRN: [id number] Date of Birth: 1929/10/09 Referring Provider:     Pulmonary Rehab Walk Test from 01/22/2018 in [place]  Referring Provider  Dr. Elsworth Soho      Encounter Date: 04/30/2018  Check In: Session Check In - 04/30/18 1030      Check-In   Location  [facility]    Staff Present  Rosebud Poles, RN, BSN;[name], MS, ACSM RCEP, Exercise Physiologist;Lisa Ysidro Evert, RN;Carlette Wilber Oliphant, RN, BSN;Ramon Dredge, RN, [facility]    Supervising physician immediately available to respond to emergencies  [facility] immediately available    Physician(s)  Dr. Broadus John    Medication changes reported      No    Fall or balance concerns reported     No    Tobacco Cessation  No Change    Warm-up and Cool-down  Performed as group-led instruction    Resistance Training Performed  Yes    VAD Patient?  No      Pain Assessment   Currently in Pain?  No/denies    Multiple Pain Sites  No       Capillary Blood Glucose: No results found for this or any previous visit (from the past 24 hour(s)).    Social History   Tobacco Use  Smoking Status Former Smoker  . Packs/day: 1.00  . Years: 40.00  . Pack years: 40.00  . Types: Cigarettes  . Last attempt to quit: 11/18/1993  . Years since quitting: 24.4  Smokeless Tobacco Never Used    Goals Met:  Exercise tolerated well Strength training completed today  Goals Unmet:  Not Applicable  Comments: Service time is from 1030 to 1215   Dr. Rush Farmer is Medical Director for Pulmonary Rehab at [facility]

## 2018-05-05 ENCOUNTER — Encounter (HOSPITAL_COMMUNITY)
Admission: RE | Admit: 2018-05-05 | Discharge: 2018-05-05 | Disposition: A | Discharge status: Home / Self Care | Payer: Medicare Other | Source: Ambulatory Visit | Attending: Pulmonary Disease | Admitting: Pulmonary Disease

## 2018-05-05 VITALS — Wt 153.7 lb

## 2018-05-05 DIAGNOSIS — M1990 Unspecified osteoarthritis, unspecified site: Secondary | ICD-10-CM | POA: Diagnosis not present

## 2018-05-05 DIAGNOSIS — E0590 Thyrotoxicosis, unspecified without thyrotoxic crisis or storm: Secondary | ICD-10-CM | POA: Diagnosis not present

## 2018-05-05 DIAGNOSIS — J449 Chronic obstructive pulmonary disease, unspecified: Secondary | ICD-10-CM | POA: Diagnosis not present

## 2018-05-05 DIAGNOSIS — Z79899 Other long term (current) drug therapy: Secondary | ICD-10-CM | POA: Diagnosis not present

## 2018-05-05 DIAGNOSIS — Z9889 Other specified postprocedural states: Secondary | ICD-10-CM | POA: Diagnosis not present

## 2018-05-05 DIAGNOSIS — Z86010 Personal history of colonic polyps: Secondary | ICD-10-CM | POA: Diagnosis not present

## 2018-05-05 NOTE — Progress Notes (Signed)
Daily Session Note  Patient Details  Name: [name] MRN: [id number] Date of Birth: 07-09-1929 Referring Provider:     Pulmonary Rehab Walk Test from 01/22/2018 in [place]  Referring Provider  Dr. Elsworth Soho      Encounter Date: 05/05/2018  Check In: Session Check In - 05/05/18 1030      Check-In   Location  [facility]    Staff Present  Rosebud Poles, RN, BSN;[name], MS, ACSM RCEP, Exercise Physiologist;Lisa Ysidro Evert, RN;Carlette Wilber Oliphant, RN, BSN;Ramon Dredge, RN, [facility]    Supervising physician immediately available to respond to emergencies  [facility] immediately available    Physician(s)  Dr. Broadus John    Medication changes reported      No    Fall or balance concerns reported     No    Tobacco Cessation  No Change    Warm-up and Cool-down  Performed as group-led instruction    Resistance Training Performed  Yes    VAD Patient?  No      Pain Assessment   Currently in Pain?  No/denies    Multiple Pain Sites  No       Capillary Blood Glucose: No results found for this or any previous visit (from the past 24 hour(s)).    Social History   Tobacco Use  Smoking Status Former Smoker  . Packs/day: 1.00  . Years: 40.00  . Pack years: 40.00  . Types: Cigarettes  . Last attempt to quit: 11/18/1993  . Years since quitting: 24.4  Smokeless Tobacco Never Used    Goals Met:  Exercise tolerated well Strength training completed today  Goals Unmet:  Not Applicable  Comments: Service time is from 1030 to 1210    Dr. Rush Farmer is Medical Director for Pulmonary Rehab at [facility].

## 2018-05-06 ENCOUNTER — Ambulatory Visit
Admission: RE | Admit: 2018-05-06 | Discharge: 2018-05-06 | Disposition: A | Discharge status: Home / Self Care | Payer: Medicare Other | Source: Ambulatory Visit | Attending: Internal Medicine | Admitting: Internal Medicine

## 2018-05-06 DIAGNOSIS — Z1231 Encounter for screening mammogram for malignant neoplasm of breast: Secondary | ICD-10-CM

## 2018-05-07 ENCOUNTER — Encounter (HOSPITAL_COMMUNITY)
Admission: RE | Admit: 2018-05-07 | Discharge: 2018-05-07 | Disposition: A | Discharge status: Home / Self Care | Payer: Medicare Other | Source: Ambulatory Visit | Attending: Pulmonary Disease | Admitting: Pulmonary Disease

## 2018-05-07 VITALS — Wt 152.6 lb

## 2018-05-07 DIAGNOSIS — J449 Chronic obstructive pulmonary disease, unspecified: Secondary | ICD-10-CM | POA: Diagnosis not present

## 2018-05-07 DIAGNOSIS — Z9889 Other specified postprocedural states: Secondary | ICD-10-CM | POA: Diagnosis not present

## 2018-05-07 DIAGNOSIS — M1990 Unspecified osteoarthritis, unspecified site: Secondary | ICD-10-CM | POA: Diagnosis not present

## 2018-05-07 DIAGNOSIS — Z79899 Other long term (current) drug therapy: Secondary | ICD-10-CM | POA: Diagnosis not present

## 2018-05-07 DIAGNOSIS — E0590 Thyrotoxicosis, unspecified without thyrotoxic crisis or storm: Secondary | ICD-10-CM | POA: Diagnosis not present

## 2018-05-07 DIAGNOSIS — Z86010 Personal history of colonic polyps: Secondary | ICD-10-CM | POA: Diagnosis not present

## 2018-05-07 NOTE — Progress Notes (Signed)
Daily Session Note  Patient Details  Name: [name] MRN: [id number] Date of Birth: [birth year] Referring Provider:     Pulmonary Rehab Walk Test from 01/22/2018 in [place]  Referring Provider  Dr. Elsworth Soho      Encounter Date: 05/07/2018  Check In: Session Check In - 05/07/18 1030      Check-In   Location  [facility]    Staff Present  Rosebud Poles, RN, BSN;[name], MS, ACSM RCEP, Exercise Physiologist;Lisa Ysidro Evert, Felipe Drone, RN, [facility]    Supervising physician immediately available to respond to emergencies  [facility] immediately available    Physician(s)  Dr. Herbert Moors    Medication changes reported      No    Fall or balance concerns reported     No    Tobacco Cessation  No Change    Warm-up and Cool-down  Performed as group-led instruction    Resistance Training Performed  Yes    VAD Patient?  No      Pain Assessment   Currently in Pain?  No/denies    Multiple Pain Sites  No       Capillary Blood Glucose: No results found for this or any previous visit (from the past 24 hour(s)).    Social History   Tobacco Use  Smoking Status Former Smoker  . Packs/day: 1.00  . Years: 40.00  . Pack years: 40.00  . Types: Cigarettes  . Last attempt to quit: 11/18/1993  . Years since quitting: 24.4  Smokeless Tobacco Never Used    Goals Met:  Exercise tolerated well Strength training completed today  Goals Unmet:  Not Applicable  Comments: Service time is from 1030 to 1220    Dr. Rush Farmer is Medical Director for Pulmonary Rehab at [facility].

## 2018-05-11 ENCOUNTER — Ambulatory Visit (HOSPITAL_COMMUNITY)
Admission: RE | Admit: 2018-05-11 | Discharge: 2018-05-11 | Disposition: A | Discharge status: Home / Self Care | Payer: Medicare Other | Source: Ambulatory Visit | Attending: Internal Medicine | Admitting: Internal Medicine

## 2018-05-11 VITALS — BP 162/80 | HR 69 | Wt 154.0 lb

## 2018-05-11 DIAGNOSIS — E669 Obesity, unspecified: Secondary | ICD-10-CM | POA: Diagnosis not present

## 2018-05-11 DIAGNOSIS — I5032 Chronic diastolic (congestive) heart failure: Secondary | ICD-10-CM | POA: Diagnosis not present

## 2018-05-11 DIAGNOSIS — Z79899 Other long term (current) drug therapy: Secondary | ICD-10-CM | POA: Insufficient documentation

## 2018-05-11 DIAGNOSIS — E0590 Thyrotoxicosis, unspecified without thyrotoxic crisis or storm: Secondary | ICD-10-CM | POA: Insufficient documentation

## 2018-05-11 DIAGNOSIS — M8580 Other specified disorders of bone density and structure, unspecified site: Secondary | ICD-10-CM | POA: Insufficient documentation

## 2018-05-11 DIAGNOSIS — I2510 Atherosclerotic heart disease of native coronary artery without angina pectoris: Secondary | ICD-10-CM | POA: Diagnosis not present

## 2018-05-11 DIAGNOSIS — I6529 Occlusion and stenosis of unspecified carotid artery: Secondary | ICD-10-CM | POA: Diagnosis not present

## 2018-05-11 DIAGNOSIS — Z955 Presence of coronary angioplasty implant and graft: Secondary | ICD-10-CM | POA: Diagnosis not present

## 2018-05-11 DIAGNOSIS — I272 Pulmonary hypertension, unspecified: Secondary | ICD-10-CM | POA: Insufficient documentation

## 2018-05-11 DIAGNOSIS — I110 Hypertensive heart disease with heart failure: Secondary | ICD-10-CM | POA: Insufficient documentation

## 2018-05-11 DIAGNOSIS — Z9981 Dependence on supplemental oxygen: Secondary | ICD-10-CM | POA: Diagnosis not present

## 2018-05-11 DIAGNOSIS — E785 Hyperlipidemia, unspecified: Secondary | ICD-10-CM | POA: Diagnosis not present

## 2018-05-11 DIAGNOSIS — Z86010 Personal history of colonic polyps: Secondary | ICD-10-CM | POA: Insufficient documentation

## 2018-05-11 DIAGNOSIS — J9611 Chronic respiratory failure with hypoxia: Secondary | ICD-10-CM | POA: Diagnosis not present

## 2018-05-11 DIAGNOSIS — J8410 Pulmonary fibrosis, unspecified: Secondary | ICD-10-CM | POA: Diagnosis not present

## 2018-05-11 DIAGNOSIS — Z7982 Long term (current) use of aspirin: Secondary | ICD-10-CM | POA: Diagnosis not present

## 2018-05-11 DIAGNOSIS — Z85828 Personal history of other malignant neoplasm of skin: Secondary | ICD-10-CM | POA: Diagnosis not present

## 2018-05-11 DIAGNOSIS — E538 Deficiency of other specified B group vitamins: Secondary | ICD-10-CM | POA: Insufficient documentation

## 2018-05-11 DIAGNOSIS — Z79890 Hormone replacement therapy (postmenopausal): Secondary | ICD-10-CM | POA: Diagnosis not present

## 2018-05-11 DIAGNOSIS — I253 Aneurysm of heart: Secondary | ICD-10-CM | POA: Insufficient documentation

## 2018-05-11 DIAGNOSIS — Z87891 Personal history of nicotine dependence: Secondary | ICD-10-CM | POA: Insufficient documentation

## 2018-05-11 DIAGNOSIS — J449 Chronic obstructive pulmonary disease, unspecified: Secondary | ICD-10-CM | POA: Insufficient documentation

## 2018-05-11 NOTE — Patient Instructions (Signed)
Stop Tadalafil  Chest X-ray and VQ Scan  Follow up AS NEEDED

## 2018-05-11 NOTE — Progress Notes (Signed)
ADVANCED HF CLINIC CONSULT NOTE  Referring Physician: Dr. Elsworth Soho Primary Cardiologist: Dr. 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 Elsworth Soho for further evaluation of pulmonary HTN.    Here with her daughter. Doing fairly well. Wears 7L at baseline and 15L with exercise and keep O2 > 88% or greater. No CP. Mild edema. No orthopnea or PND. No dizziness or palpitations. No syncopal episodes since being on O2.   Smoked for 43 years (2 ppd)  but quit in 03/1994  Started on tadafil 04/07/18. Hasn't noticed any improvement. Actually exertional sats have dropped.   Echo 3/19 LVEF 60-65% RV dilated normal function No TR jet. Weakly + bubble study  Recent ONOX (02/25/18): with significant desats  PFT 3/17  FEV1 1.15L (64%) FVC   1.90L DLCO 22%   Hi-res CT 3/17: No ILD    Review of Systems: [y] = yes, [ ]  = no   General: Weight gain [ ] ; Weight loss [ ] ; Anorexia [ ] ; Fatigue [ ] ; Fever [ ] ; Chills [ ] ; Weakness [ ]   Cardiac: Chest pain/pressure [ ] ; Resting SOB [ ] ; Exertional SOB [ y]; Orthopnea [ ] ; Pedal Edema [ y]; Palpitations [ ] ; Syncope [ ] ; Presyncope [ ] ; Paroxysmal nocturnal dyspnea[ ]   Pulmonary: Cough [ ] ; Wheezing[ ] ; Hemoptysis[ ] ; Sputum [ ] ; Snoring [ ]   GI: Vomiting[ ] ; Dysphagia[ ] ; Melena[ ] ; Hematochezia [ ] ; Heartburn[ ] ; Abdominal pain [ ] ; Constipation [ ] ; Diarrhea [ ] ; BRBPR [ ]   GU: Hematuria[ ] ; Dysuria [ ] ; Nocturia[ ]   Vascular: Pain in legs with walking [ ] ; Pain in feet with lying flat [ ] ; Non-healing sores [ ] ; Stroke [ ] ; TIA [ ] ; Slurred speech [ ] ;  Neuro: Headaches[ ] ; Vertigo[ ] ; Seizures[ ] ; Paresthesias[ ] ;Blurred vision [ ] ; Diplopia [ ] ; Vision changes [ ]   Ortho/Skin: Arthritis [ y]; Joint pain [ y]; Muscle pain [ ] ; Joint swelling [ ] ; Back Pain [ ] ; Rash [ ]   Psych: Depression[ ] ; Anxiety[ ]   Heme: Bleeding problems [ ] ; Clotting  disorders [ ] ; Anemia [ ]   Endocrine: Diabetes [ ] ; Thyroid dysfunction[ ]    Past Medical History:  Diagnosis Date  . Arthritis   . B12 deficiency   . Basal cell cancer    Nose  . Carotid artery occlusion   . Cataract   . Chronic diastolic CHF (congestive heart failure) ([place])   . Colon polyps   . COPD (chronic obstructive pulmonary disease) ([place])    STAGE 2  . Coronary artery disease    status post angioplasty of the mid RCA in 1995  . Dyspnea   . First degree atrioventricular block   . History of obesity   . Hyperlipidemia   . Hypertension    well controlled  . Hyperthyroidism   . Lichen sclerosus et atrophicus of the vulva   . Osteopenia   . PAD (peripheral artery disease) (HCC)    with stable claudication  . Pulmonary fibrosis (H[facility])    wears 4L home O2 at baseline    Current Outpatient Medications  Medication Sig Dispense Refill  . acetaminophen (TYLENOL) 500 MG tablet Take 1,000 mg by mouth at bedtime.     . Aloe-Sodium Chloride (AYR SALINE NASAL GEL NA) Place 1 application into the nose daily as needed (dryness).    Marland Kitchen  ANORO ELLIPTA 62.5-25 MCG/INH AEPB INHALE 1 PUFF INTO THE LUNGS DAILY 60 each 5  . aspirin 81 MG tablet Take 81 mg by mouth daily.      . Carboxymethylcellul-Glycerin (LUBRICATING EYE DROPS OP) Apply 1 drop to eye daily as needed (dry eyes).    Marland Kitchen denosumab (PROLIA) 60 MG/ML SOLN injection Inject 60 mg into the skin every 6 (six) months. Administer in upper arm, thigh, or abdomen    . furosemide (LASIX) 20 MG tablet Take 1 tablet (20 mg total) by mouth daily. 90 tablet 1  . isosorbide mononitrate (IMDUR) 30 MG 24 hr tablet TAKE 1 TABLET(30 MG) BY MOUTH DAILY 90 tablet 2  . levothyroxine (SYNTHROID, LEVOTHROID) 137 MCG tablet Take 137 mcg by mouth daily.      . metoprolol tartrate (LOPRESSOR) 50 MG tablet Take 1 tablet (50 mg total) by mouth 2 (two) times daily. 180 tablet 3  . multivitamin-lutein (OCUVITE-LUTEIN) CAPS capsule Take 1 capsule by mouth  daily.    . nitroGLYCERIN (NITROSTAT) 0.4 MG SL tablet Place 0.4 mg under the tongue every 5 (five) minutes as needed for chest pain (x 3 pills).    . OXYGEN 4 lpm with sleep and 6lpm all other times    . Probiotic Product (PROBIOTIC DAILY PO) Take by mouth.    . rosuvastatin (CRESTOR) 40 MG tablet Take 20 mg by mouth at bedtime.     . tadalafil (CIALIS) 20 MG tablet 1 tablet by mouth daily for pulmonary hypertension. 30 tablet 5  . albuterol (PROVENTIL HFA;VENTOLIN HFA) 108 (90 Base) MCG/ACT inhaler Inhale 2 puffs into the lungs every 6 (six) hours as needed for wheezing or shortness of breath. (Patient not taking: Reported on 05/11/2018) 1 Inhaler 1  . losartan (COZAAR) 25 MG tablet Take 1 tablet (25 mg total) by mouth daily. 30 tablet 6   No current facility-administered medications for this encounter.     No Known Allergies    Social History   Socioeconomic History  . Marital status: Widowed    Spouse name: Not on file  . Number of children: 3  . Years of education: Not on file  . Highest education level: Not on file  Occupational History  . 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 Financial resource strain: Not on file  . Food insecurity:    Worry: Not on file    Inability: Not on file  . Transportation needs:    Medical: Not on file    Non-medical: Not on file  Tobacco Use  . Smoking status: Former Smoker    Packs/day: 1.00    Years: 40.00    Pack years: 40.00    Types: Cigarettes    Last attempt to quit: 11/18/1993    Years since quitting: 24.4  . Smokeless tobacco: Never Used  Substance and Sexual Activity  . Alcohol use: No    Alcohol/week: 0.0 oz  . Drug use: No  . Sexual activity: Never  Lifestyle  . Physical activity:    Days per week: Not on file    Minutes per session: Not on file  . Stress: Not on file  Relationships  . Social connections:    Talks on phone: Not on file    Gets together: Not on file    Attends religious service: Not  on file    Active member of club or organization: Not on file    Attends meetings of clubs or organizations: Not on  file    Relationship status: Not on file  . Intimate partner violence:    Fear of current or ex partner: Not on file    Emotionally abused: Not on file    Physically abused: Not on file    Forced sexual activity: Not on file  Other Topics Concern  . Not on file  Social History Narrative  . Not on file      Family History  Problem Relation Age of Onset  . Heart attack Father 57  . Hypertension Father   . Heart disease Brother        underwent coronary bypass grafting at age 57  . Hypertension Brother   . Diabetes Sister   . Breast cancer Maternal Aunt     Vitals:   05/11/18 1130  BP: (!) 162/80  Pulse: 69  SpO2: (!) 88%  Weight: 154 lb (69.9 kg)    PHYSICAL EXAM: General:  Elderly. Sitting in [place] with O2 No respiratory difficulty HEENT: normal Neck: supple. no JVD. Carotids 2+ bilat; no bruits. No lymphadenopathy or thryomegaly appreciated. Cor: PMI nondisplaced. Regular rate & rhythm. No rubs, gallops or murmurs. Lungs: clear with decreased BS throughout  Abdomen: soft, nontender, nondistended. No hepatosplenomegaly. No bruits or masses. Good bowel sounds. Extremities: no cyanosis, clubbing, rash, 1+ chronic edema L>R Neuro: alert & oriented x 3, cranial nerves grossly intact. moves all 4 extremities w/o difficulty. Affect pleasant.   ASSESSMENT & PLAN:   1. Chronic hypoxic respiratory failure with possible PAH - she has had a very thorough work-up to date and continues with severe hypoxic respiratory failure though she remains quite active and has really made the best of her situation - in reviewing her testing she has a diffusion defect that seems well out of proportion to her parenchymal lung disease and spirometry. That said echo is not consistent with PAH. She does have a small small atrial septal aneurysm but no evidence of PAH or RV strain. We  discussed the possibility of RHC but at this point I do not think it will add much as I do not think she has significant PAH. We will proceed with VQ to exclude chronic PE (had CT angio in 2009 that was negative) - Would recommend stopping tadalafil as it seems to be making her shunting worse with a slight decrease in her oxygenation. - Would continue O2 supplementation and Pulmonary Rehab.  - Volume status looks good. No need for additional diuresis at this time. Can increase as needed.   2. CAD - s/p RCA stent 1995.  - no s/s ischemia or HF. Follows with Dr. Martinique   Daniel Bensimhon, MD  12:37 PM

## 2018-05-12 ENCOUNTER — Encounter (HOSPITAL_COMMUNITY)
Admission: RE | Admit: 2018-05-12 | Discharge: 2018-05-12 | Disposition: A | Discharge status: Home / Self Care | Payer: Medicare Other | Source: Ambulatory Visit | Attending: Pulmonary Disease | Admitting: Pulmonary Disease

## 2018-05-12 VITALS — Wt 153.9 lb

## 2018-05-12 DIAGNOSIS — Z86010 Personal history of colonic polyps: Secondary | ICD-10-CM | POA: Diagnosis not present

## 2018-05-12 DIAGNOSIS — J449 Chronic obstructive pulmonary disease, unspecified: Secondary | ICD-10-CM | POA: Diagnosis not present

## 2018-05-12 DIAGNOSIS — Z79899 Other long term (current) drug therapy: Secondary | ICD-10-CM | POA: Diagnosis not present

## 2018-05-12 DIAGNOSIS — E0590 Thyrotoxicosis, unspecified without thyrotoxic crisis or storm: Secondary | ICD-10-CM | POA: Diagnosis not present

## 2018-05-12 DIAGNOSIS — M1990 Unspecified osteoarthritis, unspecified site: Secondary | ICD-10-CM | POA: Diagnosis not present

## 2018-05-12 DIAGNOSIS — Z9889 Other specified postprocedural states: Secondary | ICD-10-CM | POA: Diagnosis not present

## 2018-05-12 NOTE — Progress Notes (Signed)
Daily Session Note  Patient Details  Name: [name] MRN: [id number] Date of Birth: 04-18-1929 Referring Provider:     Pulmonary Rehab Walk Test from 01/22/2018 in [place]  Referring Provider  Dr. Elsworth Soho      Encounter Date: 05/12/2018  Check In: Session Check In - 05/12/18 1030      Check-In   Location  [facility]    Staff Present  Rosebud Poles, RN, BSN;[name], MS, ACSM RCEP, Exercise Physiologist;Lisa Ysidro Evert, Felipe Drone, RN, [facility]    Supervising physician immediately available to respond to emergencies  [facility] immediately available    Physician(s)  Dr. Herbert Moors    Medication changes reported      No    Fall or balance concerns reported     No    Tobacco Cessation  No Change    Warm-up and Cool-down  Performed as group-led instruction    Resistance Training Performed  Yes    VAD Patient?  No    PAD/SET Patient?  No      Pain Assessment   Currently in Pain?  No/denies    Multiple Pain Sites  No       Capillary Blood Glucose: No results found for this or any previous visit (from the past 24 hour(s)).  Exercise Prescription Changes - 05/12/18 1200      Response to Exercise   Blood Pressure (Admit)  140/58    Blood Pressure (Exercise)  146/50    Blood Pressure (Exit)  102/56    Heart Rate (Admit)  70 bpm    Heart Rate (Exercise)  100 bpm    Heart Rate (Exit)  82 bpm    Oxygen Saturation (Admit)  98 %    Oxygen Saturation (Exercise)  89 %    Oxygen Saturation (Exit)  96 %    Rating of Perceived Exertion (Exercise)  13    Perceived Dyspnea (Exercise)  0    Duration  Progress to 45 minutes of aerobic exercise without signs/symptoms of physical distress    Intensity  THRR unchanged      Progression   Progression  Continue to progress workloads to maintain intensity without signs/symptoms of physical distress.      Resistance Training   Training Prescription  Yes    Weight  orange  bands    Reps  10-15    Time  10 Minutes      Interval Training   Interval Training  No      Oxygen   Oxygen  Continuous    Liters  15-25 L, 100% NRB      NuStep   Level  5    SPM  80    Minutes  17    METs  2.4      Track   Laps  16    Minutes  34       Social History   Tobacco Use  Smoking Status Former Smoker  . Packs/day: 1.00  . Years: 40.00  . Pack years: 40.00  . Types: Cigarettes  . Last attempt to quit: 11/18/1993  . Years since quitting: 24.4  Smokeless Tobacco Never Used    Goals Met:  Exercise tolerated well Strength training completed today  Goals Unmet:  Not Applicable  Comments: Service time is from 1030 to 1220    Dr. Rush Farmer is Medical Director for Pulmonary Rehab at [facility].

## 2018-05-14 ENCOUNTER — Encounter (HOSPITAL_COMMUNITY)
Admission: RE | Admit: 2018-05-14 | Discharge: 2018-05-14 | Disposition: A | Discharge status: Home / Self Care | Payer: Medicare Other | Source: Ambulatory Visit | Attending: Pulmonary Disease | Admitting: Pulmonary Disease

## 2018-05-14 VITALS — Wt 154.8 lb

## 2018-05-14 DIAGNOSIS — E0590 Thyrotoxicosis, unspecified without thyrotoxic crisis or storm: Secondary | ICD-10-CM | POA: Diagnosis not present

## 2018-05-14 DIAGNOSIS — J449 Chronic obstructive pulmonary disease, unspecified: Secondary | ICD-10-CM | POA: Diagnosis not present

## 2018-05-14 DIAGNOSIS — M1990 Unspecified osteoarthritis, unspecified site: Secondary | ICD-10-CM | POA: Diagnosis not present

## 2018-05-14 DIAGNOSIS — Z79899 Other long term (current) drug therapy: Secondary | ICD-10-CM | POA: Diagnosis not present

## 2018-05-14 DIAGNOSIS — Z86010 Personal history of colonic polyps: Secondary | ICD-10-CM | POA: Diagnosis not present

## 2018-05-14 DIAGNOSIS — Z9889 Other specified postprocedural states: Secondary | ICD-10-CM | POA: Diagnosis not present

## 2018-05-14 NOTE — Progress Notes (Signed)
Daily Session Note  Patient Details  Name: [name] MRN: [id number] Date of Birth: 26-Nov-1928 Referring Provider:     Pulmonary Rehab Walk Test from 01/22/2018 in [place]  Referring Provider  Dr. Elsworth Soho      Encounter Date: 05/14/2018  Check In: Session Check In - 05/14/18 1115      Check-In   Location  [facility]    Staff Present  Rosebud Poles, RN, BSN;[name], MS, ACSM RCEP, Exercise Physiologist;Lisa Ysidro Evert, Felipe Drone, RN, [facility]    Supervising physician immediately available to respond to emergencies  [facility] immediately available    Physician(s)  Dr. Bonner Puna    Medication changes reported      No    Fall or balance concerns reported     No    Tobacco Cessation  No Change    Warm-up and Cool-down  Performed as group-led instruction    Resistance Training Performed  Yes    VAD Patient?  No    PAD/SET Patient?  No      Pain Assessment   Currently in Pain?  No/denies    Multiple Pain Sites  No       Capillary Blood Glucose: No results found for this or any previous visit (from the past 24 hour(s)).    Social History   Tobacco Use  Smoking Status Former Smoker  . Packs/day: 1.00  . Years: 40.00  . Pack years: 40.00  . Types: Cigarettes  . Last attempt to quit: 11/18/1993  . Years since quitting: 24.5  Smokeless Tobacco Never Used    Goals Met:  Exercise tolerated well Strength training completed today  Goals Unmet:  Not Applicable  Comments: Service time is from 1115 to 1330    Dr. Rush Farmer is Medical Director for Pulmonary Rehab at [facility].

## 2018-05-14 NOTE — Progress Notes (Signed)
[name] presents to pulmonary rehab for her bi-weekly exercise session. I have completed her thirty day face to face review and determined that [name] is on track for meeting their pulmonary rehab goals. There are not barriers identified that will prevent them from continuing their exercise in pulmonary rehab as prescribed.   Rush Farmer, M.D. [facility] Pulmonary/Critical Care Medicine. Pager: [phone]. After hours pager: [phone].

## 2018-05-15 ENCOUNTER — Ambulatory Visit (HOSPITAL_COMMUNITY)
Admission: RE | Admit: 2018-05-15 | Discharge: 2018-05-15 | Disposition: A | Discharge status: Home / Self Care | Payer: Medicare Other | Source: Ambulatory Visit | Attending: Internal Medicine | Admitting: Internal Medicine

## 2018-05-15 ENCOUNTER — Encounter (HOSPITAL_COMMUNITY)
Admission: RE | Admit: 2018-05-15 | Discharge: 2018-05-15 | Disposition: A | Discharge status: Home / Self Care | Payer: Medicare Other | Source: Ambulatory Visit | Attending: Internal Medicine | Admitting: Internal Medicine

## 2018-05-15 DIAGNOSIS — R0602 Shortness of breath: Secondary | ICD-10-CM | POA: Diagnosis not present

## 2018-05-15 DIAGNOSIS — I5032 Chronic diastolic (congestive) heart failure: Secondary | ICD-10-CM | POA: Insufficient documentation

## 2018-05-15 DIAGNOSIS — I272 Pulmonary hypertension, unspecified: Secondary | ICD-10-CM

## 2018-05-15 DIAGNOSIS — J8410 Pulmonary fibrosis, unspecified: Secondary | ICD-10-CM | POA: Diagnosis not present

## 2018-05-15 DIAGNOSIS — I270 Primary pulmonary hypertension: Secondary | ICD-10-CM | POA: Diagnosis not present

## 2018-05-15 MED ORDER — TECHNETIUM TO 99M ALBUMIN AGGREGATED
4.2000 | Freq: Once | INTRAVENOUS | Status: AC | PRN
  Administered 2018-05-15: 4.2 via INTRAVENOUS

## 2018-05-15 MED ORDER — TECHNETIUM TC 99M DIETHYLENETRIAME-PENTAACETIC ACID
31.0000 | Freq: Once | INTRAVENOUS | Status: AC | PRN
  Administered 2018-05-15: 31 via RESPIRATORY_TRACT

## 2018-05-19 ENCOUNTER — Encounter (HOSPITAL_COMMUNITY)
Admission: RE | Admit: 2018-05-19 | Discharge: 2018-05-19 | Disposition: A | Discharge status: Home / Self Care | Payer: Medicare Other | Source: Ambulatory Visit | Attending: Pulmonary Disease | Admitting: Pulmonary Disease

## 2018-05-19 VITALS — Wt 152.1 lb

## 2018-05-19 DIAGNOSIS — E785 Hyperlipidemia, unspecified: Secondary | ICD-10-CM | POA: Diagnosis not present

## 2018-05-19 DIAGNOSIS — R0602 Shortness of breath: Secondary | ICD-10-CM | POA: Diagnosis not present

## 2018-05-19 DIAGNOSIS — Z86010 Personal history of colonic polyps: Secondary | ICD-10-CM | POA: Diagnosis not present

## 2018-05-19 DIAGNOSIS — I110 Hypertensive heart disease with heart failure: Secondary | ICD-10-CM | POA: Diagnosis not present

## 2018-05-19 DIAGNOSIS — M1990 Unspecified osteoarthritis, unspecified site: Secondary | ICD-10-CM | POA: Diagnosis not present

## 2018-05-19 DIAGNOSIS — I739 Peripheral vascular disease, unspecified: Secondary | ICD-10-CM | POA: Diagnosis not present

## 2018-05-19 DIAGNOSIS — Z79899 Other long term (current) drug therapy: Secondary | ICD-10-CM | POA: Diagnosis not present

## 2018-05-19 DIAGNOSIS — E0590 Thyrotoxicosis, unspecified without thyrotoxic crisis or storm: Secondary | ICD-10-CM | POA: Insufficient documentation

## 2018-05-19 DIAGNOSIS — Z87891 Personal history of nicotine dependence: Secondary | ICD-10-CM | POA: Insufficient documentation

## 2018-05-19 DIAGNOSIS — J449 Chronic obstructive pulmonary disease, unspecified: Secondary | ICD-10-CM | POA: Diagnosis not present

## 2018-05-19 DIAGNOSIS — I440 Atrioventricular block, first degree: Secondary | ICD-10-CM | POA: Insufficient documentation

## 2018-05-19 DIAGNOSIS — I5032 Chronic diastolic (congestive) heart failure: Secondary | ICD-10-CM | POA: Insufficient documentation

## 2018-05-19 DIAGNOSIS — J8410 Pulmonary fibrosis, unspecified: Secondary | ICD-10-CM | POA: Diagnosis not present

## 2018-05-19 DIAGNOSIS — Z9889 Other specified postprocedural states: Secondary | ICD-10-CM | POA: Diagnosis not present

## 2018-05-19 NOTE — Progress Notes (Signed)
Daily Session Note  Patient Details  Name: [name] MRN: [id number] Date of Birth: [birth year] Referring Provider:     Pulmonary Rehab Walk Test from 01/22/2018 in [place]  Referring Provider  Dr. Elsworth Soho      Encounter Date: 05/19/2018  Check In: Session Check In - 05/19/18 1030      Check-In   Location  [facility]    Staff Present  Rosebud Poles, RN, BSN;Carlette Carlton, RN, [facility] DiVincenzo, MS, ACSM RCEP, Exercise Physiologist;Lisa Ysidro Evert, Felipe Drone, RN, [facility]    Supervising physician immediately available to respond to emergencies  [facility] immediately available    Physician(s)  Dr. Bonner Puna    Medication changes reported      No    Fall or balance concerns reported     No    Tobacco Cessation  No Change    Warm-up and Cool-down  Performed as group-led instruction    Resistance Training Performed  Yes    VAD Patient?  No    PAD/SET Patient?  No      Pain Assessment   Currently in Pain?  No/denies    Multiple Pain Sites  No       Capillary Blood Glucose: No results found for this or any previous visit (from the past 24 hour(s)).    Social History   Tobacco Use  Smoking Status Former Smoker  . Packs/day: 1.00  . Years: 40.00  . Pack years: 40.00  . Types: Cigarettes  . Last attempt to quit: 11/18/1993  . Years since quitting: 24.5  Smokeless Tobacco Never Used    Goals Met:  Exercise tolerated well Strength training completed today  Goals Unmet:  Not Applicable  Comments: Service time is from 1030 to 1215.    Dr. Rush Farmer is Medical Director for Pulmonary Rehab at [facility].

## 2018-05-19 NOTE — Progress Notes (Signed)
Pulmonary Individual Treatment Plan  Patient Details  Name: [name] MRN: [id number] Date of Birth: 02/25/1929 Referring Provider:     Pulmonary Rehab Walk Test from 01/22/2018 in [place]  Referring Provider  Dr. Elsworth Soho      Initial Encounter Date:    Pulmonary Rehab Walk Test from 01/22/2018 in [place]  Date  01/22/18      Visit Diagnosis: Chronic obstructive pulmonary disease, unspecified COPD type ([place])  Patient's Home Medications on Admission:   Current Outpatient Medications:  .  acetaminophen (TYLENOL) 500 MG tablet, Take 1,000 mg by mouth at bedtime. , Disp: , Rfl:  .  albuterol (PROVENTIL HFA;VENTOLIN HFA) 108 (90 Base) MCG/ACT inhaler, Inhale 2 puffs into the lungs every 6 (six) hours as needed for wheezing or shortness of breath. (Patient not taking: Reported on 05/11/2018), Disp: 1 Inhaler, Rfl: 1 .  Aloe-Sodium Chloride (AYR SALINE NASAL GEL NA), Place 1 application into the nose daily as needed (dryness)., Disp: , Rfl:  .  ANORO ELLIPTA 62.5-25 MCG/INH AEPB, INHALE 1 PUFF INTO THE LUNGS DAILY, Disp: 60 each, Rfl: 5 .  aspirin 81 MG tablet, Take 81 mg by mouth daily.  , Disp: , Rfl:  .  Carboxymethylcellul-Glycerin (LUBRICATING EYE DROPS OP), Apply 1 drop to eye daily as needed (dry eyes)., Disp: , Rfl:  .  denosumab (PROLIA) 60 MG/ML SOLN injection, Inject 60 mg into the skin every 6 (six) months. Administer in upper arm, thigh, or abdomen, Disp: , Rfl:  .  furosemide (LASIX) 20 MG tablet, Take 1 tablet (20 mg total) by mouth daily., Disp: 90 tablet, Rfl: 1 .  isosorbide mononitrate (IMDUR) 30 MG 24 hr tablet, TAKE 1 TABLET(30 MG) BY MOUTH DAILY, Disp: 90 tablet, Rfl: 2 .  levothyroxine (SYNTHROID, LEVOTHROID) 137 MCG tablet, Take 137 mcg by mouth daily.  , Disp: , Rfl:  .  losartan (COZAAR) 25 MG tablet, Take 1 tablet (25 mg total) by mouth daily., Disp: 30 tablet, Rfl: 6 .  metoprolol tartrate  (LOPRESSOR) 50 MG tablet, Take 1 tablet (50 mg total) by mouth 2 (two) times daily., Disp: 180 tablet, Rfl: 3 .  multivitamin-lutein (OCUVITE-LUTEIN) CAPS capsule, Take 1 capsule by mouth daily., Disp: , Rfl:  .  nitroGLYCERIN (NITROSTAT) 0.4 MG SL tablet, Place 0.4 mg under the tongue every 5 (five) minutes as needed for chest pain (x 3 pills)., Disp: , Rfl:  .  OXYGEN, 4 lpm with sleep and 6lpm all other times, Disp: , Rfl:  .  Probiotic Product (PROBIOTIC DAILY PO), Take by mouth., Disp: , Rfl:  .  rosuvastatin (CRESTOR) 40 MG tablet, Take 20 mg by mouth at bedtime. , Disp: , Rfl:   Past Medical History: Past Medical History:  Diagnosis Date  . Arthritis   . B12 deficiency   . Basal cell cancer    Nose  . Carotid artery occlusion   . Cataract   . Chronic diastolic CHF (congestive heart failure) ([place])   . Colon polyps   . COPD (chronic obstructive pulmonary disease) ([place])    STAGE 2  . Coronary artery disease    status post angioplasty of the mid RCA in 1995  . Dyspnea   . First degree atrioventricular block   . History of obesity   . Hyperlipidemia   . Hypertension    well controlled  . Hyperthyroidism   . Lichen sclerosus et atrophicus of the vulva   .  Osteopenia   . PAD (peripheral artery disease) ([facility])    with stable claudication  . Pulmonary fibrosis ([facility])    wears 4L home O2 at baseline    Tobacco Use: Social History   Tobacco Use  Smoking Status Former Smoker  . Packs/day: 1.00  . Years: 40.00  . Pack years: 40.00  . Types: Cigarettes  . Last attempt to quit: 11/18/1993  . Years since quitting: 24.5  Smokeless Tobacco Never Used    Labs: Recent Chemical engineer    Labs for ITP Cardiac and Pulmonary Rehab Latest Ref Rng & Units 05/27/2008 12/28/2016   Hemoglobin A1c 4.8 - 5.6 % - 5.0   TCO2 - 23 -      Capillary Blood Glucose: Lab Results  Component Value Date   GLUCAP 96 12/30/2016   GLUCAP 97 12/30/2016   GLUCAP 132 (H) 12/29/2016   GLUCAP  140 (H) 12/29/2016   GLUCAP 123 (H) 12/29/2016     Pulmonary Assessment Scores: Pulmonary Assessment Scores    Row Name 01/15/18 1746 01/23/18 0704       ADL UCSD   ADL Phase  Entry  Entry    SOB Score total  49  -      CAT Score   CAT Score  20 Entry  -      mMRC Score   mMRC Score  -  0       Pulmonary Function Assessment: Pulmonary Function Assessment - 01/12/18 1453      Breath   Bilateral Breath Sounds  Clear    Shortness of Breath  Limiting activity;Yes       Exercise Target Goals:    Exercise Program Goal: Individual exercise prescription set using results from initial 6 min walk test and THRR while considering  patient's activity barriers and safety.   Exercise Prescription Goal: Initial exercise prescription builds to 30-45 minutes a day of aerobic activity, 2-3 days per week.  Home exercise guidelines will be given to patient during program as part of exercise prescription that the participant will acknowledge.  Activity Barriers & Risk Stratification: Activity Barriers & Cardiac Risk Stratification - 01/12/18 1436      Activity Barriers & Cardiac Risk Stratification   Activity Barriers  Shortness of Breath;Deconditioning       6 Minute Walk: 6 Minute Walk    Row Name 01/23/18 0700         6 Minute Walk   Phase  Initial     Distance  800 feet     Walk Time  - 5 minutes 10 seconds     # of Rest Breaks  1 stopped by ep due to low sat     MPH  1.51     METS  2.15     RPE  13     Perceived Dyspnea   2     Symptoms  Yes (comment)     Comments  used wheelchair and forehead probe-very slow o2 recovery     Resting HR  68 bpm     Resting BP  162/78     Resting Oxygen Saturation   91 %     Exercise Oxygen Saturation  during 6 min walk  78 %     Max Ex. HR  137 bpm     Max Ex. BP  180/64       Interval HR   1 Minute HR  100     2 Minute HR  118  3 Minute HR  130     4 Minute HR  132     5 Minute HR  132     6 Minute HR  137     2 Minute  Post HR  110     Interval Heart Rate?  Yes       Interval Oxygen   Interval Oxygen?  Yes     Baseline Oxygen Saturation %  91 %     1 Minute Oxygen Saturation %  95 %     1 Minute Liters of Oxygen  10 L     2 Minute Oxygen Saturation %  84 %     2 Minute Liters of Oxygen  10 L     3 Minute Oxygen Saturation %  81 %     3 Minute Liters of Oxygen  15 L     4 Minute Oxygen Saturation %  85 % rest break by EP     4 Minute Liters of Oxygen  15 L     5 Minute Oxygen Saturation %  85 %     5 Minute Liters of Oxygen  15 L     6 Minute Oxygen Saturation %  78 %     6 Minute Liters of Oxygen  15 L     2 Minute Post Oxygen Saturation %  82 % did not recover to 91% until 4 minutes post 54mt     2 Minute Post Liters of Oxygen  10 L        Oxygen Initial Assessment: Oxygen Initial Assessment - 01/23/18 0703      Initial 6 min Walk   Oxygen Used  Continuous;E-Tanks    Liters per minute  15      Program Oxygen Prescription   Program Oxygen Prescription  Continuous;E-Tanks    Liters per minute  -- 10-15    Comments  Will get guidlines from Dr. AElsworth Sohofor O2 Sat and liter flow       Oxygen Re-Evaluation: Oxygen Re-Evaluation    Row Name 01/26/18 1346 02/20/18 1045 03/23/18 1653 04/21/18 0744 05/15/18 1023     Program Oxygen Prescription   Program Oxygen Prescription  Continuous;E-Tanks  Continuous;E-Tanks  Continuous;E-Tanks  Continuous;E-Tanks  Continuous;E-Tanks   Liters per minute  -  15 NRB  15 NRB  15  25 25L with walking and 15L with seated   Comments  -  Patient needs to stay above 90%--will try 25 liters  -  Patient needs to stay above 90%--will try 25 liters  -     Home Oxygen   Home Oxygen Device  -  Portable Concentrator;Home Concentrator;E-Tanks  Portable Concentrator;Home Concentrator;E-Tanks  Portable Concentrator;Home Concentrator;E-Tanks  Portable Concentrator;Home Concentrator;E-Tanks   Sleep Oxygen Prescription  -  Continuous  Continuous  Continuous  Continuous   Liters  per minute  -  '6  6  6  6   ' Home Exercise Oxygen Prescription  -  Continuous Exercise at home is not encouraged at this time.  None Not exercising at home due to high liter flow needs  None  None   Liters per minute  -  15  -  15  -   Home at Rest Exercise Oxygen Prescription  -  Continuous  Continuous  Continuous  Continuous   Liters per minute  -  '6  6  6  6 ' 6-8   Compliance with Home Oxygen Use  -  Yes  Yes  Yes  No     Goals/Expected Outcomes   Short Term Goals  -  To learn and exhibit compliance with exercise, home and travel O2 prescription;To learn and understand importance of monitoring SPO2 with pulse oximeter and demonstrate accurate use of the pulse oximeter.;To learn and understand importance of maintaining oxygen saturations>88%;To learn and demonstrate proper pursed lip breathing techniques or other breathing techniques.;To learn and demonstrate proper use of respiratory medications  To learn and exhibit compliance with exercise, home and travel O2 prescription;To learn and understand importance of monitoring SPO2 with pulse oximeter and demonstrate accurate use of the pulse oximeter.;To learn and understand importance of maintaining oxygen saturations>88%;To learn and demonstrate proper pursed lip breathing techniques or other breathing techniques.;To learn and demonstrate proper use of respiratory medications  To learn and exhibit compliance with exercise, home and travel O2 prescription;To learn and understand importance of monitoring SPO2 with pulse oximeter and demonstrate accurate use of the pulse oximeter.;To learn and understand importance of maintaining oxygen saturations>88%;To learn and demonstrate proper pursed lip breathing techniques or other breathing techniques.;To learn and demonstrate proper use of respiratory medications  To learn and exhibit compliance with exercise, home and travel O2 prescription;To learn and understand importance of monitoring SPO2 with pulse oximeter  and demonstrate accurate use of the pulse oximeter.;To learn and understand importance of maintaining oxygen saturations>88%;To learn and demonstrate proper pursed lip breathing techniques or other breathing techniques.;To learn and demonstrate proper use of respiratory medications   Long  Term Goals  -  Exhibits compliance with exercise, home and travel O2 prescription;Verbalizes importance of monitoring SPO2 with pulse oximeter and return demonstration;Maintenance of O2 saturations>88%;Exhibits proper breathing techniques, such as pursed lip breathing or other method taught during program session;Compliance with respiratory medication;Demonstrates proper use of MDI's  Exhibits compliance with exercise, home and travel O2 prescription;Verbalizes importance of monitoring SPO2 with pulse oximeter and return demonstration;Maintenance of O2 saturations>88%;Exhibits proper breathing techniques, such as pursed lip breathing or other method taught during program session;Compliance with respiratory medication;Demonstrates proper use of MDI's  Exhibits compliance with exercise, home and travel O2 prescription;Verbalizes importance of monitoring SPO2 with pulse oximeter and return demonstration;Maintenance of O2 saturations>88%;Exhibits proper breathing techniques, such as pursed lip breathing or other method taught during program session;Compliance with respiratory medication;Demonstrates proper use of MDI's  Exhibits compliance with exercise, home and travel O2 prescription;Verbalizes importance of monitoring SPO2 with pulse oximeter and return demonstration;Maintenance of O2 saturations>88%;Exhibits proper breathing techniques, such as pursed lip breathing or other method taught during program session;Compliance with respiratory medication;Demonstrates proper use of MDI's   Goals/Expected Outcomes  -  compliance  Patient to maintain compliance  Patient to maintain compliance  -      Oxygen Discharge (Final  Oxygen Re-Evaluation): Oxygen Re-Evaluation - 05/15/18 1023      Program Oxygen Prescription   Program Oxygen Prescription  Continuous;E-Tanks    Liters per minute  25 25L with walking and 15L with seated      Home Oxygen   Home Oxygen Device  Portable Concentrator;Home Concentrator;E-Tanks    Sleep Oxygen Prescription  Continuous    Liters per minute  6    Home Exercise Oxygen Prescription  None    Home at Rest Exercise Oxygen Prescription  Continuous    Liters per minute  6 6-8    Compliance with Home Oxygen Use  No      Goals/Expected Outcomes   Short Term Goals  To learn and exhibit compliance with exercise, home and travel O2  prescription;To learn and understand importance of monitoring SPO2 with pulse oximeter and demonstrate accurate use of the pulse oximeter.;To learn and understand importance of maintaining oxygen saturations>88%;To learn and demonstrate proper pursed lip breathing techniques or other breathing techniques.;To learn and demonstrate proper use of respiratory medications    Long  Term Goals  Exhibits compliance with exercise, home and travel O2 prescription;Verbalizes importance of monitoring SPO2 with pulse oximeter and return demonstration;Maintenance of O2 saturations>88%;Exhibits proper breathing techniques, such as pursed lip breathing or other method taught during program session;Compliance with respiratory medication;Demonstrates proper use of MDI's       Initial Exercise Prescription: Initial Exercise Prescription - 01/23/18 0700      Date of Initial Exercise RX and Referring Provider   Date  01/22/18    Referring Provider  Dr. Elsworth Soho      Oxygen   Oxygen  Continuous    Liters  -- 10-15      Recumbant Bike   Level  2    Watts  10    Minutes  17      NuStep   Level  2    SPM  80    Minutes  17    METs  1.5      Track   Laps  5    Minutes  17      Prescription Details   Frequency (times per week)  2    Duration  Progress to 45 minutes of  aerobic exercise without signs/symptoms of physical distress      Intensity   THRR 40-80% of Max Heartrate  54-107    Ratings of Perceived Exertion  11-13    Perceived Dyspnea  0-4      Progression   Progression  Continue progressive overload as per policy without signs/symptoms or physical distress.      Resistance Training   Training Prescription  Yes    Weight  orange bands    Reps  10-15       Perform Capillary Blood Glucose checks as needed.  Exercise Prescription Changes: Exercise Prescription Changes    Row Name 02/05/18 1300 02/17/18 1231 03/03/18 1200 03/31/18 1200 04/14/18 1200     Response to Exercise   Blood Pressure (Admit)  144/52  122/58  136/62  124/64  124/74   Blood Pressure (Exercise)  178/72  146/50  138/70  144/58  164/64   Blood Pressure (Exit)  128/70  128/58  124/70  120/60  130/80   Heart Rate (Admit)  65 bpm  74 bpm  66 bpm  71 bpm  75 bpm   Heart Rate (Exercise)  93 bpm  92 bpm  87 bpm  103 bpm  104 bpm   Heart Rate (Exit)  68 bpm  81 bpm  61 bpm  99 bpm  67 bpm   Oxygen Saturation (Admit)  92 %  98 %  96 %  99 %  99 %   Oxygen Saturation (Exercise)  88 %  89 %  88 %  88 %  80 %   Oxygen Saturation (Exit)  96 %  100 %  97 %  100 %  97 %   Rating of Perceived Exertion (Exercise)  '15  13  12  13  11   ' Perceived Dyspnea (Exercise)  2  1  0  1  1   Duration  Progress to 45 minutes of aerobic exercise without signs/symptoms of physical distress  Progress to 45 minutes of  aerobic exercise without signs/symptoms of physical distress  Progress to 45 minutes of aerobic exercise without signs/symptoms of physical distress  Progress to 45 minutes of aerobic exercise without signs/symptoms of physical distress  Progress to 45 minutes of aerobic exercise without signs/symptoms of physical distress   Intensity  Other (comment)  Other (comment)  Other (comment)  Other (comment)  Other (comment)     Progression   Progression  Continue to progress workloads to  maintain intensity without signs/symptoms of physical distress. 40-80% HRR  Continue to progress workloads to maintain intensity without signs/symptoms of physical distress. 40-80% HRR  Continue to progress workloads to maintain intensity without signs/symptoms of physical distress. 40-80% HRR  Continue to progress workloads to maintain intensity without signs/symptoms of physical distress. 40-80% HRR  Continue to progress workloads to maintain intensity without signs/symptoms of physical distress. 40-80% HRR     Resistance Training   Training Prescription  Yes  Yes  Yes  Yes  Yes   Weight  orange bands  orange bands  orange bands  orange bands  orange bands   Reps  10-15  10-15  10-15  10-15  10-15   Time  10 Minutes  10 Minutes  10 Minutes  10 Minutes  10 Minutes     Oxygen   Oxygen  Continuous  Continuous  Continuous  Continuous  Continuous   Liters  15 L, 100% NRB  15 L, 100% NRB  15 L, 100% NRB  15 L, 100% NRB  15 L, 100% NRB     Recumbant Bike   Level  2.5  2.5  -  2  2   Watts  10  10  -  -  -   Minutes  17  17  -  17  17     NuStep   Level  '4  4  4  4  4   ' SPM  80  80  80  80  80   Minutes  17  34  34  34  34   METs  2.4  2.4  2.2  1.9  1.9   Row Name 04/28/18 1300 05/12/18 1200           Response to Exercise   Blood Pressure (Admit)  158/74  140/58      Blood Pressure (Exercise)  140/68  146/50      Blood Pressure (Exit)  123/60  102/56      Heart Rate (Admit)  73 bpm  70 bpm      Heart Rate (Exercise)  100 bpm  100 bpm      Heart Rate (Exit)  91 bpm  82 bpm      Oxygen Saturation (Admit)  97 %  98 %      Oxygen Saturation (Exercise)  85 %  89 %      Oxygen Saturation (Exit)  89 %  96 %      Rating of Perceived Exertion (Exercise)  11  13      Perceived Dyspnea (Exercise)  1  0      Duration  Progress to 45 minutes of aerobic exercise without signs/symptoms of physical distress  Progress to 45 minutes of aerobic exercise without signs/symptoms of physical distress       Intensity  THRR unchanged  THRR unchanged        Progression   Progression  Continue to progress workloads to maintain intensity without signs/symptoms of physical distress.  Continue  to progress workloads to maintain intensity without signs/symptoms of physical distress.        Resistance Training   Training Prescription  Yes  Yes      Weight  orange bands  orange bands      Reps  10-15  10-15      Time  10 Minutes  10 Minutes        Interval Training   Interval Training  -  No        Oxygen   Oxygen  Continuous  Continuous      Liters  25L, 100% NRB  15-25 L, 100% NRB        NuStep   Level  4  5      SPM  80  80      Minutes  17  17      METs  2.1  2.4        Track   Laps  22  16      Minutes  34  34         Exercise Comments:   Exercise Goals and Review: Exercise Goals    Row Name 01/12/18 1437             Exercise Goals   Increase Physical Activity  Yes       Intervention  Provide advice, education, support and counseling about physical activity/exercise needs.;Develop an individualized exercise prescription for aerobic and resistive training based on initial evaluation findings, risk stratification, comorbidities and participant's personal goals.       Expected Outcomes  Short Term: Attend rehab on a regular basis to increase amount of physical activity.;Long Term: Add in home exercise to make exercise part of routine and to increase amount of physical activity.;Long Term: Exercising regularly at least 3-5 days a week.       Increase Strength and Stamina  Yes       Intervention  Provide advice, education, support and counseling about physical activity/exercise needs.;Develop an individualized exercise prescription for aerobic and resistive training based on initial evaluation findings, risk stratification, comorbidities and participant's personal goals.       Expected Outcomes  Short Term: Increase workloads from initial exercise prescription for resistance,  speed, and METs.;Short Term: Perform resistance training exercises routinely during rehab and add in resistance training at home;Long Term: Improve cardiorespiratory fitness, muscular endurance and strength as measured by increased METs and functional capacity (6MWT)       Able to understand and use rate of perceived exertion (RPE) scale  Yes       Intervention  Provide education and explanation on how to use RPE scale       Expected Outcomes  Short Term: Able to use RPE daily in rehab to express subjective intensity level;Long Term:  Able to use RPE to guide intensity level when exercising independently       Able to understand and use Dyspnea scale  Yes       Intervention  Provide education and explanation on how to use Dyspnea scale       Expected Outcomes  Short Term: Able to use Dyspnea scale daily in rehab to express subjective sense of shortness of breath during exertion;Long Term: Able to use Dyspnea scale to guide intensity level when exercising independently       Knowledge and understanding of Target Heart Rate Range (THRR)  Yes       Intervention  Provide education and explanation of THRR including  how the numbers were predicted and where they are located for reference       Expected Outcomes  Short Term: Able to state/look up THRR;Short Term: Able to use daily as guideline for intensity in rehab;Long Term: Able to use THRR to govern intensity when exercising independently       Understanding of Exercise Prescription  Yes       Intervention  Provide education, explanation, and written materials on patient's individual exercise prescription       Expected Outcomes  Short Term: Able to explain program exercise prescription;Long Term: Able to explain home exercise prescription to exercise independently          Exercise Goals Re-Evaluation : Exercise Goals Re-Evaluation    Row Name 01/26/18 1346 02/20/18 1055 03/23/18 1655 04/21/18 0744 05/15/18 1025     Exercise Goal Re-Evaluation    Exercise Goals Review  -  Increase Physical Activity;Able to understand and use rate of perceived exertion (RPE) scale;Knowledge and understanding of Target Heart Rate Range (THRR);Understanding of Exercise Prescription;Increase Strength and Stamina;Able to understand and use Dyspnea scale  Increase Physical Activity;Able to understand and use rate of perceived exertion (RPE) scale;Knowledge and understanding of Target Heart Rate Range (THRR);Understanding of Exercise Prescription;Increase Strength and Stamina;Able to understand and use Dyspnea scale  Increase Physical Activity;Able to understand and use rate of perceived exertion (RPE) scale;Knowledge and understanding of Target Heart Rate Range (THRR);Understanding of Exercise Prescription;Increase Strength and Stamina;Able to understand and use Dyspnea scale  Increase Physical Activity;Able to understand and use rate of perceived exertion (RPE) scale;Knowledge and understanding of Target Heart Rate Range (THRR);Understanding of Exercise Prescription;Increase Strength and Stamina;Able to understand and use Dyspnea scale   Comments  Patient's first day of exercise will be 01/29/18  Patient is currently only doing seated exercise due to her desaturations. Will increase her to 25 liters with NRB next time she is here to see if this will allow her to walk on the track while keeping her o2 sats above 90%  Patient is currently only doing seated exercise due to her desaturations. Not exercising at home at this point due to high liter flow needs.  Patient is currently only doing seated exercise due to her desaturations. Patient is going to walk today on potentially 25 liters with NRB to see what her 02 sats are. Patient is eager to walk. Not exercising at home at this point due to high liter flow needs.  Ember is now walking the track for 30 minutes on 25 liters. Tolerating well. Increased workloads on the NUSTEP-patient feels like she is not finally progressing. High  liter flow is a barrier. Will cont. to monitor. Home exercise not appropriate at this time due to high liter flows.   Expected Outcomes  -  Through exercise at rehab and at home, patient will increase physical capacity and be able to carry out ADL's with ease at home. Patient will also gain the confidence and knowledge to adhere to an exercise regime at home.  Through exercise at rehab and at home, patient will increase physical capacity and be able to carry out ADL's with ease at home. Patient will also gain the confidence and knowledge to adhere to an exercise regime at home.  Through exercise at rehab and at home, the patient will decrease shortness of breath with daily activities and feel confident in carrying out an exercise regime at home.   Through exercise at rehab and at home, the patient will decrease shortness of  breath with daily activities and feel confident in carrying out an exercise regime at home.       Discharge Exercise Prescription (Final Exercise Prescription Changes): Exercise Prescription Changes - 05/12/18 1200      Response to Exercise   Blood Pressure (Admit)  140/58    Blood Pressure (Exercise)  146/50    Blood Pressure (Exit)  102/56    Heart Rate (Admit)  70 bpm    Heart Rate (Exercise)  100 bpm    Heart Rate (Exit)  82 bpm    Oxygen Saturation (Admit)  98 %    Oxygen Saturation (Exercise)  89 %    Oxygen Saturation (Exit)  96 %    Rating of Perceived Exertion (Exercise)  13    Perceived Dyspnea (Exercise)  0    Duration  Progress to 45 minutes of aerobic exercise without signs/symptoms of physical distress    Intensity  THRR unchanged      Progression   Progression  Continue to progress workloads to maintain intensity without signs/symptoms of physical distress.      Resistance Training   Training Prescription  Yes    Weight  orange bands    Reps  10-15    Time  10 Minutes      Interval Training   Interval Training  No      Oxygen   Oxygen  Continuous     Liters  15-25 L, 100% NRB      NuStep   Level  5    SPM  80    Minutes  17    METs  2.4      Track   Laps  16    Minutes  34       Nutrition:  Target Goals: Understanding of nutrition guidelines, daily intake of sodium <1523m, cholesterol <2062m calories 30% from fat and 7% or less from saturated fats, daily to have 5 or more servings of fruits and vegetables.  Biometrics: Pre Biometrics - 01/12/18 1459      Pre Biometrics   Grip Strength  30 kg        Nutrition Therapy Plan and Nutrition Goals: Nutrition Therapy & Goals - 02/12/18 1224      Nutrition Therapy   Diet  Heart Healthy      Personal Nutrition Goals   Nutrition Goal  Describe the benefit of including fruits, vegetables, whole grains, and low-fat dairy products in a healthy meal plan.      Intervention Plan   Intervention  Prescribe, educate and counsel regarding individualized specific dietary modifications aiming towards targeted core components such as weight, hypertension, lipid management, diabetes, heart failure and other comorbidities.    Expected Outcomes  Short Term Goal: Understand basic principles of dietary content, such as calories, fat, sodium, cholesterol and nutrients.;Long Term Goal: Adherence to prescribed nutrition plan.       Nutrition Assessments: Nutrition Assessments - 02/12/18 1224      Rate Your Plate Scores   Pre Score  58       Nutrition Goals Re-Evaluation:   Nutrition Goals Discharge (Final Nutrition Goals Re-Evaluation):   Psychosocial: Target Goals: Acknowledge presence or absence of significant depression and/or stress, maximize coping skills, provide positive support system. Participant is able to verbalize types and ability to use techniques and skills needed for reducing stress and depression.  Initial Review & Psychosocial Screening: Initial Psych Review & Screening - 01/12/18 1456      Initial Review  Current issues with  None Identified      Family  Dynamics   Good Support System?  Yes      Barriers   Psychosocial barriers to participate in program  There are no identifiable barriers or psychosocial needs.      Screening Interventions   Interventions  Encouraged to exercise       Quality of Life Scores:  Scores of 19 and below usually indicate a poorer quality of life in these areas.  A difference of  2-3 points is a clinically meaningful difference.  A difference of 2-3 points in the total score of the Quality of Life Index has been associated with significant improvement in overall quality of life, self-image, physical symptoms, and general health in studies assessing change in quality of life.   PHQ-9: Recent Review Flowsheet Data    Depression screen [facility] 2/9 01/12/2018   Decreased Interest 0   Down, Depressed, Hopeless 0   PHQ - 2 Score 0     Interpretation of Total Score  Total Score Depression Severity:  1-4 = Minimal depression, 5-9 = Mild depression, 10-14 = Moderate depression, 15-19 = Moderately severe depression, 20-27 = Severe depression   Psychosocial Evaluation and Intervention: Psychosocial Evaluation - 04/20/18 1639      Psychosocial Evaluation & Interventions   Interventions  Encouraged to exercise with the program and follow exercise prescription    Comments  Pt interacts well with fellow participants and staff.  Pt offers good input during education classes.    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     Expected Outcomes  patient will remain free from psychosocial barriers to participation in pulmonary rehab   patient will remain free from psychosocial barriers to participation in pulmonary rehab   patient will remain free from psychosocial barriers to participation in pulmonary rehab      Interventions  Encouraged to attend Pulmonary Rehabilitation for the exercise  Encouraged to attend Pulmonary Rehabilitation for the exercise  Encouraged to attend Pulmonary Rehabilitation for the exercise     Continue Psychosocial Services   Follow up required by staff  Follow up required by staff  Follow up required by staff        Psychosocial Discharge (Final Psychosocial Re-Evaluation): Psychosocial Re-Evaluation - 05/18/18 1445      Psychosocial Re-Evaluation   Current issues with  None Identified    Comments  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    Expected Outcomes  patient will remain free from psychosocial barriers to participation in pulmonary rehab     Interventions  Encouraged to attend Pulmonary Rehabilitation for the exercise  Continue Psychosocial Services   Follow up required by staff        Education: Education Goals: Education classes will be provided on a weekly basis, covering required topics. Participant will state understanding/return demonstration of topics presented.  Learning Barriers/Preferences: Learning Barriers/Preferences - 01/12/18 1453      Learning Barriers/Preferences   Learning Barriers  None    Learning Preferences  Written Material;Group Instruction;Verbal Instruction;Skilled Demonstration       Education Topics: How Lungs  Work and Diseases: - Discuss the anatomy of the lungs and diseases that can affect the lungs, such as COPD.   Exercise: -Discuss the importance of exercise, FITT principles of exercise, normal and abnormal responses to exercise, and how to exercise safely.   Environmental Irritants: -Discuss types of environmental irritants and how to limit exposure to environmental irritants.   Meds/Inhalers and oxygen: - Discuss respiratory medications, definition of an inhaler and oxygen, and the proper way to use an inhaler and oxygen.   Energy Saving Techniques: - Discuss methods to conserve energy and decrease shortness of breath when performing activities of daily living.    Bronchial Hygiene / Breathing Techniques: - Discuss breathing mechanics, pursed-lip breathing technique,  proper posture, effective ways to clear airways, and other functional breathing techniques   Cleaning Equipment: - Provides group verbal and written instruction about the health risks of elevated stress, cause of high stress, and healthy ways to reduce stress.   Nutrition I: Fats: - Discuss the types of cholesterol, what cholesterol does to the body, and how cholesterol levels can be controlled.   Nutrition II: Labels: -Discuss the different components of food labels and how to read food labels.   Respiratory Infections: - Discuss the signs and symptoms of respiratory infections, ways to prevent respiratory infections, and the importance of seeking medical treatment when having a respiratory infection.   Stress I: Signs and Symptoms: - Discuss the causes of stress, how stress may lead to anxiety and depression, and ways to limit stress.   Stress II: Relaxation: -Discuss relaxation techniques to limit stress.   Oxygen for Home/Travel: - Discuss how to prepare for travel when on oxygen and proper ways to transport and store oxygen to ensure safety.   Knowledge Questionnaire Score: Knowledge  Questionnaire Score - 01/15/18 1746      Knowledge Questionnaire Score   Pre Score  13/18       Core Components/Risk Factors/Patient Goals at Admission: Personal Goals and Risk Factors at Admission - 01/12/18 1456      Core Components/Risk Factors/Patient Goals on Admission   Improve shortness of breath with ADL's  Yes    Intervention  Provide education, individualized exercise plan and daily activity instruction to help decrease symptoms of SOB with activities of daily living.    Expected Outcomes  Short Term: Improve cardiorespiratory fitness to achieve a reduction of symptoms when performing ADLs;Long Term: Be able to perform more ADLs without symptoms or delay the onset of symptoms       Core Components/Risk Factors/Patient Goals Review:  Goals and Risk Factor Review    Row Name 02/23/18 1105 03/23/18 1302 04/20/18 1635 05/18/18 1441       Core Components/Risk Factors/Patient Goals Review   Personal Goals Review  Improve shortness of breath with ADL's  Improve shortness of breath with ADL's;Develop more efficient breathing techniques such as purse lipped breathing and diaphragmatic breathing and practicing self-pacing with activity.  Improve shortness of breath with ADL's;Develop more efficient breathing techniques such as  purse lipped breathing and diaphragmatic breathing and practicing self-pacing with activity.  Improve shortness of breath with ADL's    Review  patient got off to a slow start in pulmonary rehab related to her inability to stay oxygenated with the maximum amount of oxygen we could supply to her. in collaboration with her pulmonologist it was decided that she would not walk during rehab related to continued desaturations below adjusted parameters on maximum flow. patient does tolerated seated exercise and increased workloads on equiptment. she has not seen an improvement in her shortness of breath as of yet. will continue to monitor and hope to see progression towards  improvement in shortness of breath over the next 30 days.  Nicolle is doing OK in pulmonary rehab. she was seen by our medical director during the last 30 days and it was identified that the patient was in need of a HF consult for her [place]. she subsiquently saw her primary pulmonologist who facilitated this referral and patient is scheduled to see Dr. Haroldine Laws in June. she remains on 100% NRB for exercise however if she is not walking we maybe able to do a trial of O2 using a venti mask at 50%. During ambulation she continues to desaturate significantly unless she is on 25L however our current order is only for 15. will wait until patient has appointment with HF clinic for more guided direction. she states she enjoys PR and believes her shortness of breath has improved during her admission. she has had a minor incidences of epitaxis that has prohibited her from exercise. That has been resolved with a visit to the ENT and directions on nasal hydration and use of Afrin for bleeding. will continued to monitor towards pulmonary rehab goals during the next 30 days.  Tom is doing good in pulmonary rehab. she was seen by our medical director during the last 30 days and it was identified that the patient was in need of a HF consult for her [place]. she subsiquently saw her primary pulmonologist who facilitated this referral and patient saw Dr. Haroldine Laws in June. she remains on 100% NRB for exercise however if she is not walking we maybe able to do a trial of O2 using a venti mask at 50%. During ambulation she continues to desaturate significantly unless she is on 25L however our current order is only for 15. will wait until patient has appointment with HF clinic for more guided direction her appointment is scheduled for 6/24.  . she states she enjoys PR and believes her shortness of breath has improved during her admission.  Leandra continues to help out at the [employer] and has great joy from this.  We will continue to monitor  towards pulmonary rehab goals during the next 30 days.  Making slow progress albeit progress, still dealing with SOB, but able to walk 15 laps on the track in 30 minutes, and level 5 on the nustep.    Expected Outcomes  see admission expected outcomes.  see admission expected outcomes.  see admission expected outcomes.  see admission expected outcomes.       Core Components/Risk Factors/Patient Goals at Discharge (Final Review):  Goals and Risk Factor Review - 05/18/18 1441      Core Components/Risk Factors/Patient Goals Review   Personal Goals Review  Improve shortness of breath with ADL's    Review  Making slow progress albeit progress, still dealing with SOB, but able to walk 15 laps on the track in 30 minutes, and level  5 on the nustep.    Expected Outcomes  see admission expected outcomes.       ITP Comments: ITP Comments    Row Name 03/23/18 1257 04/22/18 1051         ITP Comments  Dr Jennet Maduro, Medical Director  Dr Jennet Maduro, Medical Director         Comments: ITP REVIEW Pt is making expected progress toward pulmonary rehab goals after completing 29 sessions. Recommend continued exercise, life style modification, education, and utilization of breathing techniques to increase stamina and strength and decrease shortness of breath with exertion.

## 2018-05-19 NOTE — Progress Notes (Signed)
[name] 84 y.o. female  120 day Psychosocial Note  Patient psychosocial assessment reveals no barriers to participation in Pulmonary Rehab. Patient does feel she is making progress toward Pulmonary Rehab goals. Patient reports her health and activity level has improved in the past 30 days as evidenced by patient's report of increased ability to exercise with more ease in pulmonary rehab. Patient states family/friends have noticed changes in her activity or mood. Patient reports  feeling positive about current and projected progression in Pulmonary Rehab. After reviewing the patient's treatment plan, the patient is making progress toward Pulmonary Rehab goals. Patient's rate of progress toward rehab goals is good. She has been slow to progress, but she is definitely progressing!  Plan of action to help patient continue to work towards rehab goals include supporting her in her desire to continue to exercise. Will continue to monitor and evaluate progress toward psychosocial goal(s).  Goal(s) in progress: Increase workloads as tolerated Support her in continuing to exercise after graduation from pulmonary rehab Help patient work toward returning to meaningful activities that improve patient's QOL and are attainable with patient's lung disease

## 2018-05-26 ENCOUNTER — Encounter (HOSPITAL_COMMUNITY)
Admission: RE | Admit: 2018-05-26 | Discharge: 2018-05-26 | Disposition: A | Discharge status: Home / Self Care | Payer: Medicare Other | Source: Ambulatory Visit | Attending: Pulmonary Disease | Admitting: Pulmonary Disease

## 2018-05-26 VITALS — Wt 151.9 lb

## 2018-05-26 DIAGNOSIS — M1990 Unspecified osteoarthritis, unspecified site: Secondary | ICD-10-CM | POA: Diagnosis not present

## 2018-05-26 DIAGNOSIS — J449 Chronic obstructive pulmonary disease, unspecified: Secondary | ICD-10-CM

## 2018-05-26 DIAGNOSIS — Z9889 Other specified postprocedural states: Secondary | ICD-10-CM | POA: Diagnosis not present

## 2018-05-26 DIAGNOSIS — Z79899 Other long term (current) drug therapy: Secondary | ICD-10-CM | POA: Diagnosis not present

## 2018-05-26 DIAGNOSIS — E0590 Thyrotoxicosis, unspecified without thyrotoxic crisis or storm: Secondary | ICD-10-CM | POA: Diagnosis not present

## 2018-05-26 DIAGNOSIS — Z86010 Personal history of colonic polyps: Secondary | ICD-10-CM | POA: Diagnosis not present

## 2018-05-26 NOTE — Progress Notes (Signed)
Daily Session Note  Patient Details  Name: [name] MRN: [id number] Date of Birth: 25-May-1929 Referring Provider:     Pulmonary Rehab Walk Test from 01/22/2018 in [place]  Referring Provider  Dr. Elsworth Soho      Encounter Date: 05/26/2018  Check In: Session Check In - 05/26/18 1030      Check-In   Location  [facility]    Staff Present  Rosebud Poles, RN, BSN;Carlette Wilber Oliphant, RN, BSN;Lisa Ysidro Evert, Felipe Drone, RN, [place]    Physician(s)  Dr. Verlon Au    Medication changes reported      No    Fall or balance concerns reported     No    Tobacco Cessation  No Change    Warm-up and Cool-down  Performed as group-led instruction    Resistance Training Performed  Yes    VAD Patient?  No    PAD/SET Patient?  No      Pain Assessment   Currently in Pain?  No/denies       Capillary Blood Glucose: No results found for this or any previous visit (from the past 24 hour(s)).  Exercise Prescription Changes - 05/26/18 1200      Response to Exercise   Blood Pressure (Admit)  152/74    Blood Pressure (Exercise)  124/62    Blood Pressure (Exit)  110/56    Heart Rate (Admit)  86 bpm    Heart Rate (Exercise)  92 bpm    Heart Rate (Exit)  86 bpm    Oxygen Saturation (Admit)  97 %    Oxygen Saturation (Exercise)  88 %    Oxygen Saturation (Exit)  86 %    Rating of Perceived Exertion (Exercise)  11    Perceived Dyspnea (Exercise)  2    Duration  Progress to 45 minutes of aerobic exercise without signs/symptoms of physical distress    Intensity  THRR unchanged      Progression   Progression  Continue to progress workloads to maintain intensity without signs/symptoms of physical distress.      Resistance Training   Training Prescription  Yes    Weight  orange bands    Reps  10-15    Time  10 Minutes      Interval Training   Interval Training  No      Oxygen   Oxygen  Continuous    Liters  15-25 L, 100% NRB      NuStep   Level  4    SPM  80    Minutes  17    METs  2.5      Track   Laps  16    Minutes  34       Social History   Tobacco Use  Smoking Status Former Smoker  . Packs/day: 1.00  . Years: 40.00  . Pack years: 40.00  . Types: Cigarettes  . Last attempt to quit: 11/18/1993  . Years since quitting: 24.5  Smokeless Tobacco Never Used    Goals Met:  Exercise tolerated well Strength training completed today  Goals Unmet:  Not Applicable  Comments: Service time is from 1030 to 1210    Dr. Rush Farmer is Medical Director for Pulmonary Rehab at [facility].

## 2018-05-28 ENCOUNTER — Encounter (HOSPITAL_COMMUNITY)
Admission: RE | Admit: 2018-05-28 | Discharge: 2018-05-28 | Disposition: A | Discharge status: Home / Self Care | Payer: Medicare Other | Source: Ambulatory Visit | Attending: Pulmonary Disease | Admitting: Pulmonary Disease

## 2018-05-28 VITALS — Wt 154.3 lb

## 2018-05-28 DIAGNOSIS — Z86010 Personal history of colonic polyps: Secondary | ICD-10-CM | POA: Diagnosis not present

## 2018-05-28 DIAGNOSIS — E0590 Thyrotoxicosis, unspecified without thyrotoxic crisis or storm: Secondary | ICD-10-CM | POA: Diagnosis not present

## 2018-05-28 DIAGNOSIS — Z9889 Other specified postprocedural states: Secondary | ICD-10-CM | POA: Diagnosis not present

## 2018-05-28 DIAGNOSIS — M1990 Unspecified osteoarthritis, unspecified site: Secondary | ICD-10-CM | POA: Diagnosis not present

## 2018-05-28 DIAGNOSIS — J449 Chronic obstructive pulmonary disease, unspecified: Secondary | ICD-10-CM

## 2018-05-28 DIAGNOSIS — Z79899 Other long term (current) drug therapy: Secondary | ICD-10-CM | POA: Diagnosis not present

## 2018-05-28 NOTE — Progress Notes (Signed)
Daily Session Note  Patient Details  Name: [name] MRN: [id number] Date of Birth: 16-Feb-1929 Referring Provider:     Pulmonary Rehab Walk Test from 01/22/2018 in [place]  Referring Provider  Dr. Elsworth Soho      Encounter Date: 05/28/2018  Check In: Session Check In - 05/28/18 1030      Check-In   Location  [facility]    Staff Present  Rosebud Poles, RN, BSN;Carlette Wilber Oliphant, RN, BSN;Lisa Ysidro Evert, Felipe Drone, RN, [facility]    Supervising physician immediately available to respond to emergencies  [facility] immediately available    Physician(s)  Dr. Broadus John    Medication changes reported      No    Fall or balance concerns reported     No    Tobacco Cessation  No Change    Warm-up and Cool-down  Performed as group-led instruction    Resistance Training Performed  Yes    VAD Patient?  No    PAD/SET Patient?  No      Pain Assessment   Currently in Pain?  No/denies    Multiple Pain Sites  No       Capillary Blood Glucose: No results found for this or any previous visit (from the past 24 hour(s)).    Social History   Tobacco Use  Smoking Status Former Smoker  . Packs/day: 1.00  . Years: 40.00  . Pack years: 40.00  . Types: Cigarettes  . Last attempt to quit: 11/18/1993  . Years since quitting: 24.5  Smokeless Tobacco Never Used    Goals Met:  Exercise tolerated well Strength training completed today  Goals Unmet:  Not Applicable  Comments: Service time is from 1030 to 1225.   Dr. Rush Farmer is Medical Director for Pulmonary Rehab at [facility].

## 2018-05-29 NOTE — Progress Notes (Signed)
[name] Date of Birth: 1928-12-13   History of Present Illness: [name] is seen today for followup of diastolic CHF and CAD. She has a history of coronary disease and had remote angioplasty of the mid right coronary in 1995.   She has a history of PAD. In October 2015 she developed increased SOB and edema. Echo showed normal systolic function with grade 2 diastolic dysfunction. Myoview study was normal. She was started on lasix daily with resolution of her symptoms and edema. She was seen  in February 2017 by Almyra Deforest PA with symptoms of SOB and palpitations. Echo showed normal EF 76-16% with diastolic dysfunction and moderate pulmonary HTN. Holter monitor showed isolated PACs and PVCs with a 4 beat run of idioventricular rhythm. Metoprolol dose was increased. D- dimer was normal.   She is followed regularly by Dr. Scot Dock for PAD and carotid disease. Carotid dopplers on October 15, 736 showed 10-62% LICA stenosis. Patent right CEA site. Chronic left subclavian occlusion. LE arterial dopplers were unchanged with moderate bilateral PAD.   In October she was seen in the ED with complaints of periodic palpitations. She was in NSR and labs were OK. An outpatient 30 day event monitor was arranged and was benign showing only isolated PACs.   She was seen in June by Dr. Haroldine Laws for evaluation of hypoxemia and pulmonary HTN. He felt a right heart cath would not add much at this point. V/Q scan was normal. He felt tadalafil was increasing her shunting and this was discontinued. Recommended pulmonary Rehab and supplemental oxygen.   On follow up today she is doing OK. She does note that with activity her oxygen levels drop sometimes into the 70s and with this her HR will increase. Resolves with rest.    She denies  CP, or edema. Palpitations are minimal.  She does have a cough with some sputum production.  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 She states her claudication is better since  she is on oxygen.  No increase in edema. She is wearing support hose.   Current Outpatient Medications on File Prior to Visit  Medication Sig Dispense Refill  . acetaminophen (TYLENOL) 500 MG tablet Take 1,000 mg by mouth at bedtime.     . Aloe-Sodium Chloride (AYR SALINE NASAL GEL NA) Place 1 application into the nose daily as needed (dryness).    Jearl Klinefelter ELLIPTA 62.5-25 MCG/INH AEPB INHALE 1 PUFF INTO THE LUNGS DAILY 60 each 5  . aspirin 81 MG tablet Take 81 mg by mouth daily.      . Carboxymethylcellul-Glycerin (LUBRICATING EYE DROPS OP) Apply 1 drop to eye daily as needed (dry eyes).    Marland Kitchen denosumab (PROLIA) 60 MG/ML SOLN injection Inject 60 mg into the skin every 6 (six) months. Administer in upper arm, thigh, or abdomen    . furosemide (LASIX) 20 MG tablet Take 1 tablet (20 mg total) by mouth daily. 90 tablet 1  . isosorbide mononitrate (IMDUR) 30 MG 24 hr tablet TAKE 1 TABLET(30 MG) BY MOUTH DAILY 90 tablet 2  . levothyroxine (SYNTHROID, LEVOTHROID) 137 MCG tablet Take 137 mcg by mouth daily.      . metoprolol tartrate (LOPRESSOR) 50 MG tablet Take 1 tablet (50 mg total) by mouth 2 (two) times daily. 180 tablet 3  . multivitamin-lutein (OCUVITE-LUTEIN) CAPS capsule Take 1 capsule by mouth daily.    . OXYGEN 4 lpm with sleep and 6lpm all other times    . Probiotic  Product (PROBIOTIC DAILY PO) Take by mouth.    . rosuvastatin (CRESTOR) 40 MG tablet Take 20 mg by mouth at bedtime.     Marland Kitchen losartan (COZAAR) 25 MG tablet Take 1 tablet (25 mg total) by mouth daily. 30 tablet 6   No current facility-administered medications on file prior to visit.     No Known Allergies  Past Medical History:  Diagnosis Date  . Arthritis   . B12 deficiency   . Basal cell cancer    Nose  . Carotid artery occlusion   . Cataract   . Chronic diastolic CHF (congestive heart failure) ([place])   . Colon polyps   . COPD (chronic obstructive pulmonary disease) ([place])    STAGE 2  . Coronary artery disease     status post angioplasty of the mid RCA in 1995  . Dyspnea   . First degree atrioventricular block   . History of obesity   . Hyperlipidemia   . Hypertension    well controlled  . Hyperthyroidism   . Lichen sclerosus et atrophicus of the vulva   . Osteopenia   . PAD (peripheral artery disease) ([facility])    with stable claudication  . Pulmonary fibrosis ([place])    wears 4L home O2 at baseline    Past Surgical History:  Procedure Laterality Date  . CAROTID ENDARTERECTOMY Right 2002  . CATARACT EXTRACTION W/ INTRAOCULAR LENS  IMPLANT, BILATERAL    . CHOLECYSTECTOMY  1999  . ENDARTERECTOMY     right carotid endarterectomy  . KNEE ARTHROSCOPY Left   . MENISCUS REPAIR Left 10/29/2012  . OPEN REDUCTION INTERNAL FIXATION (ORIF) DISTAL RADIAL FRACTURE Left 11/08/2016   Procedure: OPEN TREATMENT OF LEFT DISTAL RADIUS FRACTURE;  Surgeon: Milly Jakob, MD;  Location: [facility];  Service: Orthopedics;  Laterality: Left;  . REPAIR TENDONS FOOT      Social History   Tobacco Use  Smoking Status Former Smoker  . Packs/day: 1.00  . Years: 40.00  . Pack years: 40.00  . Types: Cigarettes  . Last attempt to quit: 11/18/1993  . Years since quitting: 24.5  Smokeless Tobacco Never Used    Social History   Substance and Sexual Activity  Alcohol Use No  . Alcohol/week: 0.0 oz    Family History  Problem Relation Age of Onset  . Heart attack Father 35  . Hypertension Father   . Heart disease Brother        underwent coronary bypass grafting at age 68  . Hypertension Brother   . Diabetes Sister   . Breast cancer Maternal Aunt     Review of Systems: The review of systems is  as noted above. All other systems were reviewed and are negative.  Physical Exam: BP 118/66   Pulse 70   Ht 5\' 3"  (1.6 m)   Wt 151 lb (68.5 kg)   SpO2 90%   BMI 26.75 kg/m  GENERAL:  Elderly WF in NAD. On oxygen HEENT:  PERRL, EOMI, sclera are clear. Oropharynx is clear. NECK:  No jugular venous distention,  carotid upstroke brisk and symmetric, no bruits, no thyromegaly or adenopathy LUNGS:  Few crackles left base. CHEST:  Unremarkable HEART:  RRR,  PMI not displaced or sustained,S1 and S2 within normal limits, no S3, no S4: no clicks, no rubs, no murmurs ABD:  Soft, nontender. BS +, no masses or bruits. No hepatomegaly, no splenomegaly EXT:  2 + pulses throughout, no edema, no cyanosis no clubbing SKIN:  Warm and dry.  No rashes NEURO:  Alert and oriented x 3. Cranial nerves II through XII intact. PSYCH:  Cognitively intact       LABORATORY DATA:  Lab Results  Component Value Date   WBC 8.2 01/27/2018   HGB 13.7 01/27/2018   HCT 42.6 01/27/2018   PLT 169 01/27/2018   GLUCOSE 95 01/27/2018   ALT 26 01/27/2018   AST 36 01/27/2018   NA 141 01/27/2018   K 3.8 01/27/2018   CL 106 01/27/2018   CREATININE 0.94 01/27/2018   BUN 19 01/27/2018   CO2 26 01/27/2018   TSH 1.12 01/03/2016   HGBA1C 5.0 12/28/2016    Labs dated 06/12/16: cholesterol 95, triglycerides 109, HDL 37, LDL 36. Dated 07/18/17: cholesterol 127, triglycerides 136, HDL 46, LDL 54. A1c 5.2%. CBC, CMET, TSH normal.    Echo: 12/29/15:Study Conclusions  - Left ventricle: The cavity size was normal. Wall thickness was  normal. Systolic function was normal. The estimated ejection  fraction was in the range of 60% to 65%. Features are consistent  with a pseudonormal left ventricular filling pattern, with  concomitant abnormal relaxation and increased filling pressure  (grade 2 diastolic dysfunction). Doppler parameters are  consistent with high ventricular filling pressure. - Mitral valve: Calcified annulus. Mildly thickened leaflets .  Valve area by continuity equation (using LVOT flow): 1.83 cm^2. - Pulmonary arteries: PA peak pressure: 64 mm Hg (S).  Event monitor 09/29/17:Study Highlights    Normal sinus rhythm  Isolated PACs   Echo 02/09/18: Study Conclusions  - Left ventricle: The cavity size  was normal. Wall thickness was   increased in a pattern of mild LVH. Systolic function was normal.   The estimated ejection fraction was in the range of 60% to 65%.   Wall motion was normal; there were no regional wall motion   abnormalities. Doppler parameters are consistent with abnormal   left ventricular relaxation (grade 1 diastolic dysfunction). - Aortic valve: There was no stenosis. - Mitral valve: Moderately calcified annulus. Mildly calcified   leaflets . There was trivial regurgitation. - Left atrium: The atrium was mildly dilated. - Right ventricle: The cavity size was mildly dilated. Systolic   function was normal. - Atrial septum: On bubble study, there are a few bubbles cross   from right to left. These may be late bubbles consistent with   pulmonary AVMs though they appear earlier on repeat study with   Valsalva.. - Pulmonary arteries: No complete TR doppler jet so unable to   estimate PA systolic pressure. - Inferior vena cava: The vessel was normal in size. The   respirophasic diameter changes were in the normal range (>= 50%),   consistent with normal central venous pressure.  Impressions:  - Normal LV size with mild LV hypertrophy, EF 60-65%. Mildly   dilated RV with normal systolic function. No significant valvular   abnormalities. Bubble study with some bubbles crossing.   Initially, looked late (like pulmonary AVMs) but repeat study   with valsalva seemed to show earlier crossing.   Assessment / Plan: 1. Chronic diastolic CHF. She has no significant edema. Weight is stable.    Will continue current therapy with lasix 20 mg daily. Sodium restriction.  2. Coronary disease with remote PCI of the right coronary in 1995. Myoview study in Dec. 2015 showed no significant ischemia and ejection fraction of 76%. Continue medical management with aspirin, nitrates and metoprolol.  She is asymptomatic.   3.  PAD with chronic stable  claudication. ABIs  in November 2018  unchanged from prior. Chronic left subclavian occlusion with mild left arm claudication.   4. Carotid arterial disease status post right carotid endarterectomy. Dopplers in November 2018 stable.   5. Hypertension, controlled.  6. Hyperlipidemia well controlled on Crestor and fish oil. Scheduled for fasting lab work next month with primary care.   7. COPD. On chronic oxygen. She has a prior history of heavy tobacco abuse. Quit 1995. Followed by Dr. Elsworth Soho.  8. Pulmonary HTN secondary to #7. Per Dr. Haroldine Laws.  9. Palpitations. Prior event monitor was benign. Just PACs  I will follow up in 6 months.

## 2018-06-01 ENCOUNTER — Encounter: Payer: Self-pay | Admitting: Cardiology

## 2018-06-01 ENCOUNTER — Ambulatory Visit (INDEPENDENT_AMBULATORY_CARE_PROVIDER_SITE_OTHER): Payer: Medicare Other | Admitting: Cardiology

## 2018-06-01 VITALS — BP 118/66 | HR 70 | Ht 63.0 in | Wt 151.0 lb

## 2018-06-01 DIAGNOSIS — I10 Essential (primary) hypertension: Secondary | ICD-10-CM

## 2018-06-01 DIAGNOSIS — I2510 Atherosclerotic heart disease of native coronary artery without angina pectoris: Secondary | ICD-10-CM

## 2018-06-01 DIAGNOSIS — I779 Disorder of arteries and arterioles, unspecified: Secondary | ICD-10-CM | POA: Diagnosis not present

## 2018-06-01 DIAGNOSIS — I5032 Chronic diastolic (congestive) heart failure: Secondary | ICD-10-CM | POA: Diagnosis not present

## 2018-06-01 MED ORDER — NITROGLYCERIN 0.4 MG SL SUBL: 0.4000 mg | SUBLINGUAL_TABLET | SUBLINGUAL | 3 refills | Status: AC | PRN

## 2018-06-01 NOTE — Patient Instructions (Signed)
Continue your current therapy  I will see you in 6 months.   

## 2018-06-02 ENCOUNTER — Encounter (HOSPITAL_COMMUNITY)
Admission: RE | Admit: 2018-06-02 | Discharge: 2018-06-02 | Disposition: A | Discharge status: Home / Self Care | Payer: Medicare Other | Source: Ambulatory Visit | Attending: Pulmonary Disease | Admitting: Pulmonary Disease

## 2018-06-02 DIAGNOSIS — Z79899 Other long term (current) drug therapy: Secondary | ICD-10-CM | POA: Diagnosis not present

## 2018-06-02 DIAGNOSIS — J449 Chronic obstructive pulmonary disease, unspecified: Secondary | ICD-10-CM | POA: Diagnosis not present

## 2018-06-02 DIAGNOSIS — Z9889 Other specified postprocedural states: Secondary | ICD-10-CM | POA: Diagnosis not present

## 2018-06-02 DIAGNOSIS — E0590 Thyrotoxicosis, unspecified without thyrotoxic crisis or storm: Secondary | ICD-10-CM | POA: Diagnosis not present

## 2018-06-02 DIAGNOSIS — Z86010 Personal history of colonic polyps: Secondary | ICD-10-CM | POA: Diagnosis not present

## 2018-06-02 DIAGNOSIS — M1990 Unspecified osteoarthritis, unspecified site: Secondary | ICD-10-CM | POA: Diagnosis not present

## 2018-06-02 NOTE — Progress Notes (Signed)
Daily Session Note  Patient Details  Name: [name] MRN: [id number] Date of Birth: [birth year] Referring Provider:     Pulmonary Rehab Walk Test from 01/22/2018 in [place]  Referring Provider  Dr. Elsworth Soho      Encounter Date: 06/02/2018  Check In: Session Check In - 06/02/18 1030      Check-In   Location  [facility]    Staff Present  Rosebud Poles, RN, BSN;Carlette Carlton, RN, [facility], MS, ACSM RCEP, Exercise Physiologist;Lisa Ysidro Evert, Felipe Drone, RN, [facility]    Supervising physician immediately available to respond to emergencies  [facility] immediately available    Physician(s)  Dr. Broadus John    Medication changes reported      No    Fall or balance concerns reported     No    Tobacco Cessation  No Change    Warm-up and Cool-down  Performed as group-led instruction    Resistance Training Performed  Yes    VAD Patient?  No    PAD/SET Patient?  No      Pain Assessment   Currently in Pain?  No/denies    Multiple Pain Sites  No       Capillary Blood Glucose: No results found for this or any previous visit (from the past 24 hour(s)).    Social History   Tobacco Use  Smoking Status Former Smoker  . Packs/day: 1.00  . Years: 40.00  . Pack years: 40.00  . Types: Cigarettes  . Last attempt to quit: 11/18/1993  . Years since quitting: 24.5  Smokeless Tobacco Never Used    Goals Met:  Exercise tolerated well Strength training completed today  Goals Unmet:  Not Applicable  Comments: Service time is from 1030 to 1205    Dr. Rush Farmer is Medical Director for Pulmonary Rehab at [facility].

## 2018-06-04 ENCOUNTER — Encounter (HOSPITAL_COMMUNITY)
Admission: RE | Admit: 2018-06-04 | Discharge: 2018-06-04 | Disposition: A | Discharge status: Home / Self Care | Payer: Medicare Other | Source: Ambulatory Visit | Attending: Pulmonary Disease | Admitting: Pulmonary Disease

## 2018-06-04 VITALS — Wt 151.5 lb

## 2018-06-04 DIAGNOSIS — J449 Chronic obstructive pulmonary disease, unspecified: Secondary | ICD-10-CM

## 2018-06-04 DIAGNOSIS — Z79899 Other long term (current) drug therapy: Secondary | ICD-10-CM | POA: Diagnosis not present

## 2018-06-04 DIAGNOSIS — R55 Syncope and collapse: Secondary | ICD-10-CM | POA: Diagnosis not present

## 2018-06-04 DIAGNOSIS — I4891 Unspecified atrial fibrillation: Secondary | ICD-10-CM | POA: Diagnosis not present

## 2018-06-04 DIAGNOSIS — M1990 Unspecified osteoarthritis, unspecified site: Secondary | ICD-10-CM | POA: Diagnosis not present

## 2018-06-04 DIAGNOSIS — S0990XA Unspecified injury of head, initial encounter: Secondary | ICD-10-CM | POA: Diagnosis not present

## 2018-06-04 DIAGNOSIS — R569 Unspecified convulsions: Secondary | ICD-10-CM | POA: Diagnosis not present

## 2018-06-04 DIAGNOSIS — Z86010 Personal history of colonic polyps: Secondary | ICD-10-CM | POA: Diagnosis not present

## 2018-06-04 DIAGNOSIS — E0590 Thyrotoxicosis, unspecified without thyrotoxic crisis or storm: Secondary | ICD-10-CM | POA: Diagnosis not present

## 2018-06-04 DIAGNOSIS — Z9889 Other specified postprocedural states: Secondary | ICD-10-CM | POA: Diagnosis not present

## 2018-06-04 DIAGNOSIS — R4020 Unspecified coma: Secondary | ICD-10-CM | POA: Diagnosis not present

## 2018-06-04 NOTE — Progress Notes (Signed)
Daily Session Note  Patient Details  Name: [name] MRN: [id number] Date of Birth: 03-30-1929 Referring Provider:     Pulmonary Rehab Walk Test from 01/22/2018 in [place]  Referring Provider  Dr. Elsworth Soho      Encounter Date: 06/04/2018  Check In: Session Check In - 06/04/18 1030      Check-In   Location  [facility]  (Pended)     Staff Present  Rosebud Poles, RN, BSN;Carlette Carlton, RN, BSN;[name], MS, ACSM RCEP, Exercise Physiologist;Lisa Ysidro Evert, [place], RN, MHA  (Pended)     Supervising physician immediately available to respond to emergencies  [facility] immediately available  (Pended)        Capillary Blood Glucose: No results found for this or any previous visit (from the past 24 hour(s)).    Social History   Tobacco Use  Smoking Status Former Smoker  . Packs/day: 1.00  . Years: 40.00  . Pack years: 40.00  . Types: Cigarettes  . Last attempt to quit: 11/18/1993  . Years since quitting: 24.5  Smokeless Tobacco Never Used    Goals Met:  Exercise tolerated well Strength training completed today  Goals Unmet:  Not Applicable  Comments: Service time is from 1030 to 1210    Dr. Rush Farmer is Medical Director for Pulmonary Rehab at [facility].

## 2018-06-05 ENCOUNTER — Other Ambulatory Visit: Payer: Self-pay

## 2018-06-05 ENCOUNTER — Observation Stay (HOSPITAL_COMMUNITY)
Admission: EM | Admit: 2018-06-05 | Discharge: 2018-06-07 | Disposition: A | Discharge status: Home / Self Care | Payer: Medicare Other | Attending: Family Medicine | Admitting: Family Medicine

## 2018-06-05 ENCOUNTER — Encounter (HOSPITAL_COMMUNITY): Payer: Self-pay | Admitting: Emergency Medicine

## 2018-06-05 ENCOUNTER — Emergency Department (HOSPITAL_COMMUNITY): Payer: Medicare Other

## 2018-06-05 DIAGNOSIS — Z888 Allergy status to other drugs, medicaments and biological substances status: Secondary | ICD-10-CM | POA: Insufficient documentation

## 2018-06-05 DIAGNOSIS — R55 Syncope and collapse: Secondary | ICD-10-CM | POA: Diagnosis not present

## 2018-06-05 DIAGNOSIS — Z7982 Long term (current) use of aspirin: Secondary | ICD-10-CM | POA: Insufficient documentation

## 2018-06-05 DIAGNOSIS — R0600 Dyspnea, unspecified: Secondary | ICD-10-CM | POA: Diagnosis not present

## 2018-06-05 DIAGNOSIS — Z9981 Dependence on supplemental oxygen: Secondary | ICD-10-CM | POA: Insufficient documentation

## 2018-06-05 DIAGNOSIS — I2510 Atherosclerotic heart disease of native coronary artery without angina pectoris: Secondary | ICD-10-CM | POA: Insufficient documentation

## 2018-06-05 DIAGNOSIS — I700 Atherosclerosis of aorta: Secondary | ICD-10-CM | POA: Diagnosis not present

## 2018-06-05 DIAGNOSIS — H269 Unspecified cataract: Secondary | ICD-10-CM | POA: Insufficient documentation

## 2018-06-05 DIAGNOSIS — Z86010 Personal history of colonic polyps: Secondary | ICD-10-CM | POA: Insufficient documentation

## 2018-06-05 DIAGNOSIS — N179 Acute kidney failure, unspecified: Secondary | ICD-10-CM | POA: Insufficient documentation

## 2018-06-05 DIAGNOSIS — E785 Hyperlipidemia, unspecified: Secondary | ICD-10-CM | POA: Diagnosis not present

## 2018-06-05 DIAGNOSIS — E538 Deficiency of other specified B group vitamins: Secondary | ICD-10-CM | POA: Diagnosis not present

## 2018-06-05 DIAGNOSIS — N3000 Acute cystitis without hematuria: Secondary | ICD-10-CM | POA: Insufficient documentation

## 2018-06-05 DIAGNOSIS — J449 Chronic obstructive pulmonary disease, unspecified: Secondary | ICD-10-CM | POA: Insufficient documentation

## 2018-06-05 DIAGNOSIS — R262 Difficulty in walking, not elsewhere classified: Secondary | ICD-10-CM | POA: Insufficient documentation

## 2018-06-05 DIAGNOSIS — Z8249 Family history of ischemic heart disease and other diseases of the circulatory system: Secondary | ICD-10-CM | POA: Insufficient documentation

## 2018-06-05 DIAGNOSIS — S0990XA Unspecified injury of head, initial encounter: Secondary | ICD-10-CM | POA: Diagnosis not present

## 2018-06-05 DIAGNOSIS — E0590 Thyrotoxicosis, unspecified without thyrotoxic crisis or storm: Secondary | ICD-10-CM | POA: Insufficient documentation

## 2018-06-05 DIAGNOSIS — D751 Secondary polycythemia: Secondary | ICD-10-CM | POA: Diagnosis not present

## 2018-06-05 DIAGNOSIS — E039 Hypothyroidism, unspecified: Secondary | ICD-10-CM | POA: Insufficient documentation

## 2018-06-05 DIAGNOSIS — Z9889 Other specified postprocedural states: Secondary | ICD-10-CM | POA: Insufficient documentation

## 2018-06-05 DIAGNOSIS — E669 Obesity, unspecified: Secondary | ICD-10-CM | POA: Diagnosis not present

## 2018-06-05 DIAGNOSIS — Z9049 Acquired absence of other specified parts of digestive tract: Secondary | ICD-10-CM | POA: Insufficient documentation

## 2018-06-05 DIAGNOSIS — Z87891 Personal history of nicotine dependence: Secondary | ICD-10-CM | POA: Insufficient documentation

## 2018-06-05 DIAGNOSIS — J8410 Pulmonary fibrosis, unspecified: Secondary | ICD-10-CM | POA: Insufficient documentation

## 2018-06-05 DIAGNOSIS — I10 Essential (primary) hypertension: Secondary | ICD-10-CM

## 2018-06-05 DIAGNOSIS — G319 Degenerative disease of nervous system, unspecified: Secondary | ICD-10-CM | POA: Insufficient documentation

## 2018-06-05 DIAGNOSIS — I5033 Acute on chronic diastolic (congestive) heart failure: Secondary | ICD-10-CM | POA: Insufficient documentation

## 2018-06-05 DIAGNOSIS — J9611 Chronic respiratory failure with hypoxia: Secondary | ICD-10-CM | POA: Diagnosis present

## 2018-06-05 DIAGNOSIS — Z79890 Hormone replacement therapy (postmenopausal): Secondary | ICD-10-CM | POA: Insufficient documentation

## 2018-06-05 DIAGNOSIS — I5032 Chronic diastolic (congestive) heart failure: Secondary | ICD-10-CM

## 2018-06-05 DIAGNOSIS — I110 Hypertensive heart disease with heart failure: Secondary | ICD-10-CM | POA: Diagnosis not present

## 2018-06-05 DIAGNOSIS — Z66 Do not resuscitate: Secondary | ICD-10-CM | POA: Insufficient documentation

## 2018-06-05 DIAGNOSIS — R0602 Shortness of breath: Secondary | ICD-10-CM | POA: Diagnosis not present

## 2018-06-05 DIAGNOSIS — Z803 Family history of malignant neoplasm of breast: Secondary | ICD-10-CM | POA: Insufficient documentation

## 2018-06-05 DIAGNOSIS — I420 Dilated cardiomyopathy: Secondary | ICD-10-CM | POA: Diagnosis not present

## 2018-06-05 DIAGNOSIS — N390 Urinary tract infection, site not specified: Secondary | ICD-10-CM

## 2018-06-05 DIAGNOSIS — I6782 Cerebral ischemia: Secondary | ICD-10-CM | POA: Insufficient documentation

## 2018-06-05 DIAGNOSIS — Z833 Family history of diabetes mellitus: Secondary | ICD-10-CM | POA: Insufficient documentation

## 2018-06-05 DIAGNOSIS — Z7951 Long term (current) use of inhaled steroids: Secondary | ICD-10-CM | POA: Insufficient documentation

## 2018-06-05 DIAGNOSIS — M8580 Other specified disorders of bone density and structure, unspecified site: Secondary | ICD-10-CM | POA: Diagnosis not present

## 2018-06-05 DIAGNOSIS — Z85828 Personal history of other malignant neoplasm of skin: Secondary | ICD-10-CM | POA: Insufficient documentation

## 2018-06-05 DIAGNOSIS — Z79899 Other long term (current) drug therapy: Secondary | ICD-10-CM | POA: Insufficient documentation

## 2018-06-05 DIAGNOSIS — Z9841 Cataract extraction status, right eye: Secondary | ICD-10-CM | POA: Insufficient documentation

## 2018-06-05 DIAGNOSIS — Z9842 Cataract extraction status, left eye: Secondary | ICD-10-CM | POA: Insufficient documentation

## 2018-06-05 LAB — BASIC METABOLIC PANEL
Anion gap: 13 (ref 5–15)
BUN: 19 mg/dL (ref 8–23)
CALCIUM: 8.8 mg/dL — AB (ref 8.9–10.3)
CO2: 23 mmol/L (ref 22–32)
Chloride: 106 mmol/L (ref 98–111)
Creatinine, Ser: 1.02 mg/dL — ABNORMAL HIGH (ref 0.44–1.00)
GFR, EST AFRICAN AMERICAN: 56 mL/min — AB (ref >60)
GFR, EST NON AFRICAN AMERICAN: 48 mL/min — AB (ref >60)
Glucose, Bld: 123 mg/dL — ABNORMAL HIGH (ref 70–99)
Potassium: 3.1 mmol/L — ABNORMAL LOW (ref 3.5–5.1)
SODIUM: 142 mmol/L (ref 135–145)

## 2018-06-05 LAB — URINALYSIS, ROUTINE W REFLEX MICROSCOPIC
BILIRUBIN URINE: NEGATIVE
Glucose, UA: NEGATIVE mg/dL
Hgb urine dipstick: NEGATIVE
KETONES UR: NEGATIVE mg/dL
Nitrite: POSITIVE — AB
Protein, ur: 100 mg/dL — AB
Specific Gravity, Urine: 1.018 (ref 1.005–1.030)
WBC, UA: >50 WBC/hpf — ABNORMAL HIGH (ref 0–5)
pH: 5 (ref 5.0–8.0)

## 2018-06-05 LAB — TROPONIN I
Troponin I: <0.03 ng/mL (ref <0.03)
Troponin I: <0.03 ng/mL (ref <0.03)
Troponin I: <0.03 ng/mL (ref <0.03)

## 2018-06-05 LAB — BRAIN NATRIURETIC PEPTIDE: B NATRIURETIC PEPTIDE 5: 845.8 pg/mL — AB (ref 0.0–100.0)

## 2018-06-05 LAB — CBC WITH DIFFERENTIAL/PLATELET
Abs Immature Granulocytes: 0.1 10*3/uL (ref 0.0–0.1)
BASOS ABS: 0 10*3/uL (ref 0.0–0.1)
Basophils Relative: 0 %
EOS ABS: 0 10*3/uL (ref 0.0–0.7)
EOS PCT: 1 %
HCT: 42.6 % (ref 36.0–46.0)
Hemoglobin: 13.2 g/dL (ref 12.0–15.0)
IMMATURE GRANULOCYTES: 1 %
Lymphocytes Relative: 13 %
Lymphs Abs: 1.1 10*3/uL (ref 0.7–4.0)
MCH: 24.8 pg — ABNORMAL LOW (ref 26.0–34.0)
MCHC: 31 g/dL (ref 30.0–36.0)
MCV: 80.1 fL (ref 78.0–100.0)
Monocytes Absolute: 0.6 10*3/uL (ref 0.1–1.0)
Monocytes Relative: 7 %
NEUTROS PCT: 78 %
Neutro Abs: 6.7 10*3/uL (ref 1.7–7.7)
PLATELETS: 170 10*3/uL (ref 150–400)
RBC: 5.32 MIL/uL — AB (ref 3.87–5.11)
RDW: 14.4 % (ref 11.5–15.5)
WBC: 8.4 10*3/uL (ref 4.0–10.5)

## 2018-06-05 LAB — I-STAT TROPONIN, ED: Troponin i, poc: 0.04 ng/mL (ref 0.00–0.08)

## 2018-06-05 LAB — I-STAT CG4 LACTIC ACID, ED: Lactic Acid, Venous: 1.21 mmol/L (ref 0.5–1.9)

## 2018-06-05 LAB — TSH: TSH: 1.006 u[IU]/mL (ref 0.350–4.500)

## 2018-06-05 MED ORDER — SODIUM CHLORIDE 0.9% FLUSH
3.0000 mL | Freq: Two times a day (BID) | INTRAVENOUS | Status: DC
  Administered 2018-06-05 – 2018-06-07 (×5): 3 mL via INTRAVENOUS

## 2018-06-05 MED ORDER — FUROSEMIDE 10 MG/ML IJ SOLN
20.0000 mg | Freq: Once | INTRAMUSCULAR | Status: AC
  Administered 2018-06-05: 20 mg via INTRAVENOUS
  Filled 2018-06-05: qty 2

## 2018-06-05 MED ORDER — FUROSEMIDE 20 MG PO TABS
20.0000 mg | ORAL_TABLET | Freq: Every day | ORAL | Status: DC
  Administered 2018-06-05: 20 mg via ORAL
  Filled 2018-06-05: qty 1

## 2018-06-05 MED ORDER — HYPROMELLOSE (GONIOSCOPIC) 2.5 % OP SOLN: 1.0000 [drp] | Freq: Every day | OPHTHALMIC | Status: DC | PRN

## 2018-06-05 MED ORDER — ACETAMINOPHEN 650 MG RE SUPP: 650.0000 mg | Freq: Four times a day (QID) | RECTAL | Status: DC | PRN

## 2018-06-05 MED ORDER — LEVOTHYROXINE SODIUM 137 MCG PO TABS: 137.0000 ug | ORAL_TABLET | Freq: Every day | ORAL | Status: DC

## 2018-06-05 MED ORDER — ONDANSETRON HCL 4 MG PO TABS: 4.0000 mg | ORAL_TABLET | Freq: Four times a day (QID) | ORAL | Status: DC | PRN

## 2018-06-05 MED ORDER — SODIUM CHLORIDE 0.9 % IV SOLN
1.0000 g | INTRAVENOUS | Status: DC
  Filled 2018-06-05: qty 10

## 2018-06-05 MED ORDER — ISOSORBIDE MONONITRATE ER 30 MG PO TB24
30.0000 mg | ORAL_TABLET | Freq: Every day | ORAL | Status: DC
  Administered 2018-06-05 – 2018-06-07 (×3): 30 mg via ORAL
  Filled 2018-06-05 (×3): qty 1

## 2018-06-05 MED ORDER — POTASSIUM CHLORIDE CRYS ER 20 MEQ PO TBCR
40.0000 meq | EXTENDED_RELEASE_TABLET | Freq: Once | ORAL | Status: AC
  Administered 2018-06-05: 40 meq via ORAL
  Filled 2018-06-05: qty 2

## 2018-06-05 MED ORDER — UMECLIDINIUM-VILANTEROL 62.5-25 MCG/INH IN AEPB
1.0000 | INHALATION_SPRAY | Freq: Every day | RESPIRATORY_TRACT | Status: DC
  Administered 2018-06-05 – 2018-06-07 (×3): 1 via RESPIRATORY_TRACT
  Filled 2018-06-05: qty 14

## 2018-06-05 MED ORDER — TETANUS-DIPHTH-ACELL PERTUSSIS 5-2.5-18.5 LF-MCG/0.5 IM SUSP
0.5000 mL | Freq: Once | INTRAMUSCULAR | Status: DC
  Filled 2018-06-05: qty 0.5

## 2018-06-05 MED ORDER — METOPROLOL TARTRATE 25 MG PO TABS
50.0000 mg | ORAL_TABLET | Freq: Two times a day (BID) | ORAL | Status: DC
  Administered 2018-06-05 – 2018-06-07 (×5): 50 mg via ORAL
  Filled 2018-06-05 (×5): qty 2

## 2018-06-05 MED ORDER — SODIUM CHLORIDE 0.9 % IV SOLN
2.0000 g | Freq: Once | INTRAVENOUS | Status: AC
  Administered 2018-06-05: 2 g via INTRAVENOUS
  Filled 2018-06-05: qty 20

## 2018-06-05 MED ORDER — IPRATROPIUM-ALBUTEROL 0.5-2.5 (3) MG/3ML IN SOLN: 3.0000 mL | RESPIRATORY_TRACT | Status: DC | PRN

## 2018-06-05 MED ORDER — ACETAMINOPHEN 325 MG PO TABS
650.0000 mg | ORAL_TABLET | Freq: Four times a day (QID) | ORAL | Status: DC | PRN
  Administered 2018-06-05 – 2018-06-06 (×3): 650 mg via ORAL
  Filled 2018-06-05 (×3): qty 2

## 2018-06-05 MED ORDER — ENOXAPARIN SODIUM 40 MG/0.4ML ~~LOC~~ SOLN
40.0000 mg | Freq: Every day | SUBCUTANEOUS | Status: DC
  Administered 2018-06-05 – 2018-06-06 (×2): 40 mg via SUBCUTANEOUS
  Filled 2018-06-05 (×2): qty 0.4

## 2018-06-05 MED ORDER — ASPIRIN EC 81 MG PO TBEC
81.0000 mg | DELAYED_RELEASE_TABLET | Freq: Every day | ORAL | Status: DC
  Administered 2018-06-05 – 2018-06-07 (×3): 81 mg via ORAL
  Filled 2018-06-05 (×3): qty 1

## 2018-06-05 MED ORDER — POTASSIUM CHLORIDE CRYS ER 20 MEQ PO TBCR
40.0000 meq | EXTENDED_RELEASE_TABLET | ORAL | Status: AC
  Administered 2018-06-05: 40 meq via ORAL
  Filled 2018-06-05: qty 2

## 2018-06-05 MED ORDER — ROSUVASTATIN CALCIUM 20 MG PO TABS
20.0000 mg | ORAL_TABLET | Freq: Every day | ORAL | Status: DC
  Administered 2018-06-05 – 2018-06-06 (×2): 20 mg via ORAL
  Filled 2018-06-05 (×2): qty 1

## 2018-06-05 MED ORDER — LOSARTAN POTASSIUM 25 MG PO TABS
25.0000 mg | ORAL_TABLET | Freq: Every day | ORAL | Status: DC
  Administered 2018-06-05 – 2018-06-07 (×3): 25 mg via ORAL
  Filled 2018-06-05 (×3): qty 1

## 2018-06-05 MED ORDER — LEVOTHYROXINE SODIUM 25 MCG PO TABS
137.0000 ug | ORAL_TABLET | Freq: Every day | ORAL | Status: DC
  Administered 2018-06-05 – 2018-06-07 (×3): 137 ug via ORAL
  Filled 2018-06-05 (×3): qty 1

## 2018-06-05 MED ORDER — ONDANSETRON HCL 4 MG/2ML IJ SOLN: 4.0000 mg | Freq: Four times a day (QID) | INTRAMUSCULAR | Status: DC | PRN

## 2018-06-05 MED ORDER — NITROGLYCERIN 0.4 MG SL SUBL: 0.4000 mg | SUBLINGUAL_TABLET | SUBLINGUAL | Status: DC | PRN

## 2018-06-05 NOTE — ED Triage Notes (Signed)
Per EMS pt is on 8L [place] at home.  Turned her O2 down to 6L as per usual for rest and fell hitting her head.  Normal sats on 8L are high 80s, low 90s.  No blood thinners.  Takes 81mg  asa daily.  Skin tear to right arm.  Total recall of the event. A& O x 4.  Hx of afib.

## 2018-06-05 NOTE — H&P (Addendum)
History and Physical    LADONNE [name] WYO:[id number] DOB: 1928/12/13 DOA: 06/05/2018  Referring MD/NP/PA: Joseph Berkshire, MD PCP: [name], MD  Patient coming from: Home via EMS  Chief Complaint: Syncope  I have personally briefly reviewed patient's old medical records in [address]   HPI: [name] is a 84 y.o. female with medical history significant of COPD, oxygen dependent, HTN, HLD, CAD s/p PCI, hypothyroidism, and diastolic CHF followed by Dr. Haroldine Laws; who presents after having a syncopal episode at home.  She had been watching television with her daughter and she had gotten ready to go to bed around 10:30 PM last night.  Normally patient is on 8 L of oxygen while ambulating, but patient reports turning oxygen settings down to 5 L.  After walking into her room patient reported feeling weird as though everything was fading out.  She sat down on a bench in her room, and the next thing she recalled was her daughter over her asking her if she was okay.  She reports having a bruise on her head related to the fall.  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  Patient reports that she has lost a little bit of weight (maintaining around 149-152 pounds), mild lower back pain that she rated related to exercise at rehab, and has a chronic cough that is unchanged.  Denies any wheezing, chest pain, palpitations, significant leg swelling because she uses compression stockings, dysuria, nausea, vomiting, or diarrhea.  Patient reports that she is been going to cardiac rehab over the last several weeks.  ED Course: Patient was noted to be hypoxic into the 80s on normal home oxygen settings in the ED.  Labs revealed WBC 8.4, potassium of 3.1, BUN 19, creatinine 1.02, and lactic acid 1.21.  CT scan of the brain showed no acute abnormalities.  Chest x-ray showed stable cardiomegaly without signs of edema.  Urinalysis  shows signs of infection.  Patient was given Rocephin and 20 mg Lasix IV.  TRH called to admit  Review of Systems  Constitutional: Positive for weight loss. Negative for chills, fever and malaise/fatigue.  HENT: Negative for congestion and nosebleeds.   Eyes: Negative for double vision and pain.  Respiratory: Positive for cough and sputum production. Negative for wheezing.   Cardiovascular: Negative for chest pain and palpitations.  Gastrointestinal: Negative for abdominal pain, constipation, nausea and vomiting.  Genitourinary: Positive for flank pain. Negative for dysuria and frequency.  Musculoskeletal: Positive for falls. Negative for neck pain.  Neurological: Positive for dizziness and loss of consciousness.  Endo/Heme/Allergies: Bruises/bleeds easily.  Psychiatric/Behavioral: Negative for memory loss and substance abuse.    Past Medical History:  Diagnosis Date  . Arthritis   . B12 deficiency   . Basal cell cancer    Nose  . Carotid artery occlusion   . Cataract   . Chronic diastolic CHF (congestive heart failure) ([place])   . Colon polyps   . COPD (chronic obstructive pulmonary disease) ([place])    STAGE 2  . Coronary artery disease    status post angioplasty of the mid RCA in 1995  . Dyspnea   . First degree atrioventricular block   . History of obesity   . Hyperlipidemia   . Hypertension    well controlled  . Hyperthyroidism   . Lichen sclerosus et atrophicus of the vulva   . Osteopenia   . PAD (peripheral artery disease) ([place])  with stable claudication  . Pulmonary fibrosis ([place])    wears 4L home O2 at baseline    Past Surgical History:  Procedure Laterality Date  . CAROTID ENDARTERECTOMY Right 2002  . CATARACT EXTRACTION W/ INTRAOCULAR LENS  IMPLANT, BILATERAL    . CHOLECYSTECTOMY  1999  . ENDARTERECTOMY     right carotid endarterectomy  . KNEE ARTHROSCOPY Left   . MENISCUS REPAIR Left 10/29/2012  . OPEN REDUCTION INTERNAL FIXATION (ORIF) DISTAL RADIAL  FRACTURE Left 11/08/2016   Procedure: OPEN TREATMENT OF LEFT DISTAL RADIUS FRACTURE;  Surgeon: Milly Jakob, MD;  Location: [facility];  Service: Orthopedics;  Laterality: Left;  . REPAIR TENDONS FOOT       reports that she quit smoking about 24 years ago. Her smoking use included cigarettes. She has a 40.00 pack-year smoking history. She has never used smokeless tobacco. She reports that she does not drink alcohol or use drugs.  No Known Allergies  Family History  Problem Relation Age of Onset  . Heart attack Father 12  . Hypertension Father   . Heart disease Brother        underwent coronary bypass grafting at age 2  . Hypertension Brother   . Diabetes Sister   . Breast cancer Maternal Aunt     Prior to Admission medications   Medication Sig Start Date End Date Taking? Authorizing Provider  acetaminophen (TYLENOL) 500 MG tablet Take 1,000 mg by mouth at bedtime.     [provider]  Aloe-Sodium Chloride (AYR SALINE NASAL GEL NA) Place 1 application into the nose daily as needed (dryness).    [provider]  Celedonio Miyamoto 62.5-25 MCG/INH AEPB INHALE 1 PUFF INTO THE LUNGS DAILY 04/14/18   Rigoberto Noel, MD  aspirin 81 MG tablet Take 81 mg by mouth daily.      [provider]  Carboxymethylcellul-Glycerin (LUBRICATING EYE DROPS OP) Apply 1 drop to eye daily as needed (dry eyes).    [provider]  denosumab (PROLIA) 60 MG/ML SOLN injection Inject 60 mg into the skin every 6 (six) months. Administer in upper arm, thigh, or abdomen    [provider]  furosemide (LASIX) 20 MG tablet Take 1 tablet (20 mg total) by mouth daily. 02/24/18   Martin[place], Peter M, MD  isosorbide mononitrate (IMDUR) 30 MG 24 hr tablet TAKE 1 TABLET(30 MG) BY MOUTH DAILY 11/21/17   [place], Peter M, MD  levothyroxine (SYNTHROID, LEVOTHROID) 137 MCG tablet Take 137 mcg by mouth daily.      [provider]  losartan (COZAAR) 25 MG tablet Take 1 tablet (25 mg total) by  mouth daily. 01/15/18 04/15/18  [place], Peter M, MD  metoprolol tartrate (LOPRESSOR) 50 MG tablet Take 1 tablet (50 mg total) by mouth 2 (two) times daily. 01/15/18   [place], Peter M, MD  multivitamin-lutein [employer]) CAPS capsule Take 1 capsule by mouth daily.    [provider]  nitroGLYCERIN (NITROSTAT) 0.4 MG SL tablet Place 1 tablet (0.4 mg total) under the tongue every 5 (five) minutes as needed for chest pain (MAX 3 doses). 06/01/18   [place], Peter M, MD  OXYGEN 4 lpm with sleep and 6lpm all other times    [provider]  Probiotic Product (PROBIOTIC DAILY PO) Take by mouth.    [provider]  rosuvastatin (CRESTOR) 40 MG tablet Take 20 mg by mouth at bedtime.     [provider]    Physical Exam:  Constitutional: Elderly female  in NAD, calm, comfortable Vitals:   06/05/18 0215 06/05/18 0230 06/05/18 0245 06/05/18 0300  BP: (!) 166/60 (!) 157/60 (!) 164/63 (!) 175/71  Pulse: (!) 59 64 63 61  Resp:      Temp:      TempSrc:      SpO2: 95% 91% 93% 94%   Eyes: PERRL, lids and conjunctivae normal ENMT: Mucous membranes are moist. Posterior pharynx clear of any exudate or lesions.  Neck: normal, supple, no masses, no thyromegaly Respiratory: Decreased aeration, but lungs sound grossly clear.  Patient able to talk in complete sentences.  Cardiovascular: Distant heart sounds.  Regular rate and rhythm, no murmurs / rubs / gallops.  Trace lower extremity edema. 1+ pedal pulses. No carotid bruits.  Abdomen: no tenderness, no masses palpated. No hepatosplenomegaly. Bowel sounds positive.  Musculoskeletal: no clubbing / cyanosis. No joint deformity upper and lower extremities. Good ROM, no contractures. Normal muscle tone.  Skin: Bruise to the right forehead.   Neurologic: CN 2-12 grossly intact. Sensation intact, DTR normal. Strength 5/5 in all 4.  Psychiatric: Normal judgment and insight. Alert and oriented x 3. Normal mood.     Labs on  Admission: I have personally reviewed following labs and imaging studies  CBC: Recent Labs  Lab 06/05/18 0053  WBC 8.4  NEUTROABS 6.7  HGB 13.2  HCT 42.6  MCV 80.1  PLT 732   Basic Metabolic Panel: Recent Labs  Lab 06/05/18 0053  NA 142  K 3.1*  CL 106  CO2 23  GLUCOSE 123*  BUN 19  CREATININE 1.02*  CALCIUM 8.8*   GFR: Estimated Creatinine Clearance: 36.8 mL/min (A) (by C-G formula based on SCr of 1.02 mg/dL (H)). Liver Function Tests: No results for input(s): AST, ALT, ALKPHOS, BILITOT, PROT, ALBUMIN in the last 168 hours. No results for input(s): LIPASE, AMYLASE in the last 168 hours. No results for input(s): AMMONIA in the last 168 hours. Coagulation Profile: No results for input(s): INR, PROTIME in the last 168 hours. Cardiac Enzymes: No results for input(s): CKTOTAL, CKMB, CKMBINDEX, TROPONINI in the last 168 hours. BNP (last 3 results) No results for input(s): PROBNP in the last 8760 hours. HbA1C: No results for input(s): HGBA1C in the last 72 hours. CBG: No results for input(s): GLUCAP in the last 168 hours. Lipid Profile: No results for input(s): CHOL, HDL, LDLCALC, TRIG, CHOLHDL, LDLDIRECT in the last 72 hours. Thyroid Function Tests: No results for input(s): TSH, T4TOTAL, FREET4, T3FREE, THYROIDAB in the last 72 hours. Anemia Panel: No results for input(s): VITAMINB12, FOLATE, FERRITIN, TIBC, IRON, RETICCTPCT in the last 72 hours. Urine analysis:    Component Value Date/Time   COLORURINE YELLOW 06/05/2018 0132   APPEARANCEUR HAZY (A) 06/05/2018 0132   LABSPEC 1.018 06/05/2018 0132   PHURINE 5.0 06/05/2018 0132   GLUCOSEU NEGATIVE 06/05/2018 0132   HGBUR NEGATIVE 06/05/2018 0132   BILIRUBINUR NEGATIVE 06/05/2018 0132   KETONESUR NEGATIVE 06/05/2018 0132   PROTEINUR 100 (A) 06/05/2018 0132   UROBILINOGEN 0.2 05/20/2013 0934   NITRITE POSITIVE (A) 06/05/2018 0132   LEUKOCYTESUR LARGE (A) 06/05/2018 0132   Sepsis Labs: No results found for this  or any previous visit (from the past 240 hour(s)).   Radiological Exams on Admission: Dg Chest 2 View  Result Date: 06/05/2018 CLINICAL DATA:  Dyspnea and syncope EXAM: CHEST - 2 VIEW COMPARISON:  05/15/2018 FINDINGS: Stable cardiomegaly with aortic atherosclerosis. Stable mild interstitial prominence compatible with known fibrosis. Probable scarring at the left lung base. No  overt pulmonary edema, pulmonary consolidation, effusion or pneumothorax. IMPRESSION: Stable cardiomegaly with aortic atherosclerosis. Chronic interstitial prominence compatible with known fibrosis. Probable scarring at the left lung base. No active pulmonary disease. Electronically Signed   By: Ashley Royalty M.D.   On: 06/05/2018 01:20   Ct Head Wo Contrast  Result Date: 06/05/2018 CLINICAL DATA:  84 year old female with fall and trauma to the head. EXAM: CT HEAD WITHOUT CONTRAST TECHNIQUE: Contiguous axial images were obtained from the base of the skull through the vertex without intravenous contrast. COMPARISON:  Head CT dated 05/31/2008 FINDINGS: Brain: There is mild age-related atrophy and chronic microvascular ischemic changes. There is no acute intracranial hemorrhage. No mass effect midline shift no extra-axial fluid collection. Vascular: No hyperdense vessel or unexpected calcification. Skull: Normal. Negative for fracture or focal lesion. Sinuses/Orbits: Minimal mucoperiosteal thickening of paranasal sinuses. No air-fluid levels. Minimal bilateral mastoid effusions. Other: Right forehead hematoma. IMPRESSION: 1. No acute intracranial pathology. 2. Mild age-related atrophy and chronic microvascular ischemic changes. Electronically Signed   By: Anner Crete M.D.   On: 06/05/2018 01:42    EKG: Independently reviewed.  Sinus rhythm with first-degree heart block at 66 bpm and  left posterior fascicular block  Assessment/Plan Syncope and collapse, head contusion: Patient reportedly just gotten up to go to bed and had  recently turned down oxygen settings when reportedly lost consciousness and fell to the floor hitting her head.  CT scan of the brain showed no acute abnormalities.  Suspect decrease in oxygenation likely because of syncope. - Admit to a telemetry bed - Trend cardiac troponin - Follow-up telemetry  Diastolic CHF: Patient with acutely elevated BNP of 845.8 when compared to previous.  Patient was given 20 mg of IV Lasix in the ED.  Last echocardiogram showed EF 60 to 65% with grade 1 diastolic dysfunction in 03/7845.  On physical exam patient does not appear to be fluid overload and lungs sound clear. - Strict I&O's daily weights  - Message sent for cardiology to eval in a.m. - Reassess in a.m. and determine if needed continue IV diuresis - Question need of repeat echocardiogram  Urinary tract infection: Acute.   Patient does complain of some mild lower back pain. - Follow-up urine culture - Continue Rocephin  Chronic respiratory failure with hypoxia, COPD without acute exacerbation: stable.   - Continuous pulse oximetry with nasal cannula oxygen - Continue Anoiro Ellipta - DuoNeb's as needed for shortness of breath/wheezing\  Essential hypertension - Continue metoprolol, losartan, isosorbide mononitrate  CAD s/p PCI  in 1995, PAD  Hypothyroidism  - Check TSH - Continue levothyroxine  DVT prophylaxis: Lovenox Code Status: DNR Family Communication: No family present at bedside Disposition Plan: Likely discharge home once medically stable Consults called: None Admission status: observation  Norval Morton MD [facility] Pager [phone]   If 7PM-7AM, please contact night-coverage [email or web address] Password [email or web address]  06/05/2018, 4:50 AM

## 2018-06-05 NOTE — Progress Notes (Signed)
PROGRESS NOTE    [name]  QHU:[id number] DOB: 1929/04/22 DOA: 06/05/2018 PCP: [name], MD      Brief Narrative:  [name] is a 84 y.o. F with COPD FEV1 67%, pHTN, dCHF and chronic hypoxic respiratory failure who presents with syncopal episode.  Patient was at home watching television, got up, walk to the back of her house to go to bed, turn her oxygen tank down, then she continued walking her vision "grayed out", and she passed out onto the floor.  She has very high oxygen requirements with exertion, had similar passing out when her oxygen is been lower than usual recently.      Assessment & Plan:  Syncope Likely this was hypoxia mediated.   -I am doubtful that CHF is contributing, despite her elevated BNP, because she has no symptoms of orthopnea, leg swelling, weight gain (she weighs daily, is reliable).   -UTI should not cause hypoxia.   -No symptoms or CXR findings to suggest pneumonia -No wheezing to suggest COPD flare Her only dyspnea on exertion was the day before admission, at [facility] rehab, but that was again in the context of her oxygen level being accidentally set too low.   -Severe range pressures are probably not helping her cardiopulmonary dynamics, but I will defer afterload vs preload adjustments to Cardiology.   -Consult Cardiology, appreciate cares    Chronic diastolic CHF Recent Echo showed diastolic dysfunction, RV dysfunction, normal LV function, no significant valve disease.    COPD on home O2 FEV1 only 67%, DLCO very low, disproportionate to other lung disease.  Last CT chest was in 2017, showed no interstitial lung disease. -Low threshold to repeat CT chest -Continue Anoro -Continue albuterol PRN  Hypertension Coronary disease secondary prevention -Continue aspirin, statin -Continue Imdur -Continue losartan -Continue metoprolol  Hypothyroidism -Continue levothyroxine   Hypokalemia Mild. -Repeat supplement K     DVT  prophylaxis: Lovenox Code Status: DO NOT RESUSCITATE Family Communication: Son at bedside MDM and disposition Plan: This is a no charge note.  For further details, please see H&P by my partner Dr. Tamala Julian from earlier today.  The below labs and imaging reports were reviewed and summarized above.    The patient was admitted with syncope, likely from hypoxia.  Unclear to me what precipitated hypoxia.    Will walk with home O2 today.  If normal, I would favor D/c with Pulm foloow up.  Defer adjustments to antihypertensives to Cardiology.    Objective: Vitals:   06/05/18 0628 06/05/18 0743 06/05/18 0828 06/05/18 1135  BP: (!) 177/68 (!) 163/67  114/69  Pulse: 64 68  (!) 56  Resp:  18  18  Temp:  (!) 97.3 F (36.3 C)  98 F (36.7 C)  TempSrc:  Oral  Oral  SpO2:  97% 95% 93%  Weight:      Height:        Intake/Output Summary (Last 24 hours) at 06/05/2018 1325 Last data filed at 06/05/2018 1053 Gross per 24 hour  Intake 160 ml  Output 750 ml  Net -590 ml   Filed Weights   06/05/18 0626  Weight: 68.1 kg (150 lb 3.2 oz)    Examination: The patient was seen and examined.      Data Reviewed: I have personally reviewed following labs and imaging studies:  CBC: Recent Labs  Lab 06/05/18 0053  WBC 8.4  NEUTROABS 6.7  HGB 13.2  HCT 42.6  MCV 80.1  PLT 170   Basic  Metabolic Panel: Recent Labs  Lab 06/05/18 0053  NA 142  K 3.1*  CL 106  CO2 23  GLUCOSE 123*  BUN 19  CREATININE 1.02*  CALCIUM 8.8*   GFR: Estimated Creatinine Clearance: 36.7 mL/min (A) (by C-G formula based on SCr of 1.02 mg/dL (H)). Liver Function Tests: No results for input(s): AST, ALT, ALKPHOS, BILITOT, PROT, ALBUMIN in the last 168 hours. No results for input(s): LIPASE, AMYLASE in the last 168 hours. No results for input(s): AMMONIA in the last 168 hours. Coagulation Profile: No results for input(s): INR, PROTIME in the last 168 hours. Cardiac Enzymes: Recent Labs  Lab 06/05/18 0523  06/05/18 1116  TROPONINI <0.03 <0.03   BNP (last 3 results) No results for input(s): PROBNP in the last 8760 hours. HbA1C: No results for input(s): HGBA1C in the last 72 hours. CBG: No results for input(s): GLUCAP in the last 168 hours. Lipid Profile: No results for input(s): CHOL, HDL, LDLCALC, TRIG, CHOLHDL, LDLDIRECT in the last 72 hours. Thyroid Function Tests: Recent Labs    06/05/18 0523  TSH 1.006   Anemia Panel: No results for input(s): VITAMINB12, FOLATE, FERRITIN, TIBC, IRON, RETICCTPCT in the last 72 hours. Urine analysis:    Component Value Date/Time   COLORURINE YELLOW 06/05/2018 0132   APPEARANCEUR HAZY (A) 06/05/2018 0132   LABSPEC 1.018 06/05/2018 0132   PHURINE 5.0 06/05/2018 0132   GLUCOSEU NEGATIVE 06/05/2018 0132   HGBUR NEGATIVE 06/05/2018 0132   BILIRUBINUR NEGATIVE 06/05/2018 0132   KETONESUR NEGATIVE 06/05/2018 0132   PROTEINUR 100 (A) 06/05/2018 0132   UROBILINOGEN 0.2 05/20/2013 0934   NITRITE POSITIVE (A) 06/05/2018 0132   LEUKOCYTESUR LARGE (A) 06/05/2018 0132   Sepsis Labs: @LABRCNTIP (procalcitonin:4,lacticacidven:4)  )No results found for this or any previous visit (from the past 240 hour(s)).       Radiology Studies: Dg Chest 2 View  Result Date: 06/05/2018 CLINICAL DATA:  Dyspnea and syncope EXAM: CHEST - 2 VIEW COMPARISON:  05/15/2018 FINDINGS: Stable cardiomegaly with aortic atherosclerosis. Stable mild interstitial prominence compatible with known fibrosis. Probable scarring at the left lung base. No overt pulmonary edema, pulmonary consolidation, effusion or pneumothorax. IMPRESSION: Stable cardiomegaly with aortic atherosclerosis. Chronic interstitial prominence compatible with known fibrosis. Probable scarring at the left lung base. No active pulmonary disease. Electronically Signed   By: Ashley Royalty M.D.   On: 06/05/2018 01:20   Ct Head Wo Contrast  Result Date: 06/05/2018 CLINICAL DATA:  84 year old female with fall and  trauma to the head. EXAM: CT HEAD WITHOUT CONTRAST TECHNIQUE: Contiguous axial images were obtained from the base of the skull through the vertex without intravenous contrast. COMPARISON:  Head CT dated 05/31/2008 FINDINGS: Brain: There is mild age-related atrophy and chronic microvascular ischemic changes. There is no acute intracranial hemorrhage. No mass effect midline shift no extra-axial fluid collection. Vascular: No hyperdense vessel or unexpected calcification. Skull: Normal. Negative for fracture or focal lesion. Sinuses/Orbits: Minimal mucoperiosteal thickening of paranasal sinuses. No air-fluid levels. Minimal bilateral mastoid effusions. Other: Right forehead hematoma. IMPRESSION: 1. No acute intracranial pathology. 2. Mild age-related atrophy and chronic microvascular ischemic changes. Electronically Signed   By: Anner Crete M.D.   On: 06/05/2018 01:42        Scheduled Meds: . aspirin EC  81 mg Oral Daily  . enoxaparin (LOVENOX) injection  40 mg Subcutaneous Daily  . isosorbide mononitrate  30 mg Oral Daily  . levothyroxine  137 mcg Oral QAC breakfast  . losartan  25 mg Oral  Daily  . metoprolol tartrate  50 mg Oral BID  . rosuvastatin  20 mg Oral QHS  . sodium chloride flush  3 mL Intravenous Q12H  . Tdap  0.5 mL Intramuscular Once  . umeclidinium-vilanterol  1 puff Inhalation Daily   Continuous Infusions: . [START ON 06/06/2018] cefTRIAXone (ROCEPHIN)  IV       LOS: 0 days    Time spent: 25 minutes    Edwin Dada, MD [facility] 06/05/2018, 1:25 PM     Pager [phone] --- please page though AMION:  [email or web address] Password [email or web address] If 7PM-7AM, please contact night-coverage

## 2018-06-05 NOTE — ED Provider Notes (Signed)
[name]MENT Provider Note   CSN: [id number] Arrival date & time: 06/05/18  0026     History   Chief Complaint Chief Complaint  Patient presents with  . Loss of Consciousness  . Fall    HPI [name] is a 84 y.o. female.  Patient presents to the emergency department after a syncopal episode.  Patient reports that she was sitting and watching a movie with her daughter.  She turned down her oxygen because she normally does before she goes to bed.  During the day and when active she is on 8 L.  She walked back to her bedroom and when she was walking she started feeling weak and woozy.  She sat down on a chair in her room in the next thing she knew she woke up on the floor.  She has a large contusion on her right forehead with some pain around that area.  Otherwise she has no complaints.  No neck or back pain.  She is short of breath but not significantly changed from her baseline.  No chest pain.  She denies hip and leg pain.  She has an abrasion on her right elbow, otherwise no extremity complaints.     Past Medical History:  Diagnosis Date  . Arthritis   . B12 deficiency   . Basal cell cancer    Nose  . Carotid artery occlusion   . Cataract   . Chronic diastolic CHF (congestive heart failure) ([place])   . Colon polyps   . COPD (chronic obstructive pulmonary disease) ([place])    STAGE 2  . Coronary artery disease    status post angioplasty of the mid RCA in 1995  . Dyspnea   . First degree atrioventricular block   . History of obesity   . Hyperlipidemia   . Hypertension    well controlled  . Hyperthyroidism   . Lichen sclerosus et atrophicus of the vulva   . Osteopenia   . PAD (peripheral artery disease) ([facility])    with stable claudication  . Pulmonary fibrosis ([place])    wears 4L home O2 at baseline    Patient Active Problem List   Diagnosis Date Noted  . Cor pulmonale (chronic) ([place]) 02/10/2018  . Tachycardia 12/27/2016  .  Influenza with pneumonia 12/26/2016  . Chronic respiratory failure with hypoxia ([place]) 07/10/2016  . Polycythemia, secondary 07/05/2016  . Pulmonary hypertension ([place]) 02/06/2016  . COPD GOLD II  02/05/2016  . Chronic diastolic CHF (congestive heart failure) ([place]) 11/01/2014  . CHF (congestive heart failure), NYHA class II ([place]) 10/18/2014  . Carotid stenosis 09/21/2014  . Osteopenia 10/27/2013  . Aftercare following surgery of the circulatory system, [place] 09/15/2013  . 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 Vaginal atrophy 05/20/2013  . Peripheral vascular disease ([place]) 02/10/2013  . Occlusion and stenosis of carotid artery without mention of cerebral infarction 09/16/2012  . Pain in limb 09/16/2012  . Hypertension   . Hyperlipidemia   . PAD (peripheral artery disease) ([place])   . Coronary artery disease     Past Surgical History:  Procedure Laterality Date  . CAROTID ENDARTERECTOMY Right 2002  . CATARACT EXTRACTION W/ INTRAOCULAR LENS  IMPLANT, BILATERAL    . CHOLECYSTECTOMY  1999  . ENDARTERECTOMY     right carotid endarterectomy  . KNEE ARTHROSCOPY Left   . MENISCUS REPAIR Left 10/29/2012  . OPEN REDUCTION INTERNAL FIXATION (ORIF) DISTAL RADIAL FRACTURE Left 11/08/2016   Procedure: OPEN TREATMENT OF LEFT  DISTAL RADIUS FRACTURE;  Surgeon: Milly Jakob, MD;  Location: [facility];  Service: Orthopedics;  Laterality: Left;  . REPAIR TENDONS FOOT       OB History    Gravida  3   Para  3   Term      Preterm      AB      Living  3     SAB      TAB      Ectopic      Multiple      Live Births               Home Medications    Prior to Admission medications   Medication Sig Start Date End Date Taking? Authorizing Provider  acetaminophen (TYLENOL) 500 MG tablet Take 1,000 mg by mouth at bedtime.     [provider]  Aloe-Sodium Chloride (AYR SALINE NASAL GEL NA) Place 1 application into the nose daily as needed (dryness).    [provider]    Celedonio Miyamoto 62.5-25 MCG/INH AEPB INHALE 1 PUFF INTO THE LUNGS DAILY 04/14/18   Rigoberto Noel, MD  aspirin 81 MG tablet Take 81 mg by mouth daily.      [provider]  Carboxymethylcellul-Glycerin (LUBRICATING EYE DROPS OP) Apply 1 drop to eye daily as needed (dry eyes).    [provider]  denosumab (PROLIA) 60 MG/ML SOLN injection Inject 60 mg into the skin every 6 (six) months. Administer in upper arm, thigh, or abdomen    [provider]  furosemide (LASIX) 20 MG tablet Take 1 tablet (20 mg total) by mouth daily. 02/24/18   [place], Peter M, MD  isosorbide mononitrate (IMDUR) 30 MG 24 hr tablet TAKE 1 TABLET(30 MG) BY MOUTH DAILY 11/21/17   [place], Peter M, MD  levothyroxine (SYNTHROID, LEVOTHROID) 137 MCG tablet Take 137 mcg by mouth daily.      [provider]  losartan (COZAAR) 25 MG tablet Take 1 tablet (25 mg total) by mouth daily. 01/15/18 04/15/18  [place], Peter M, MD  metoprolol tartrate (LOPRESSOR) 50 MG tablet Take 1 tablet (50 mg total) by mouth 2 (two) times daily. 01/15/18   [place], Peter M, MD  multivitamin-lutein [facility]) CAPS capsule Take 1 capsule by mouth daily.    [provider]  nitroGLYCERIN (NITROSTAT) 0.4 MG SL tablet Place 1 tablet (0.4 mg total) under the tongue every 5 (five) minutes as needed for chest pain (MAX 3 doses). 06/01/18   [place], Peter M, MD  OXYGEN 4 lpm with sleep and 6lpm all other times    [provider]  Probiotic Product (PROBIOTIC DAILY PO) Take by mouth.    [provider]  rosuvastatin (CRESTOR) 40 MG tablet Take 20 mg by mouth at bedtime.     [provider]    Family History Family History  Problem Relation Age of Onset  . Heart attack Father 40  . Hypertension Father   . Heart disease Brother        underwent coronary bypass grafting at age 60  . Hypertension Brother   . Diabetes Sister   . Breast cancer Maternal Aunt     Social History Social History    Tobacco Use  . Smoking status: Former Smoker    Packs/day: 1.00    Years: 40.00    Pack years: 40.00    Types: Cigarettes    Last attempt to quit: 11/18/1993    Years since quitting: 24.5  .  Smokeless tobacco: Never Used  Substance Use Topics  . Alcohol use: No    Alcohol/week: 0.0 oz  . Drug use: No     Allergies   Patient has no known allergies.   Review of Systems Review of Systems  Respiratory: Positive for shortness of breath.   Cardiovascular: Negative for chest pain.  Skin: Positive for wound.  Neurological: Positive for syncope.  All other systems reviewed and are negative.    Physical Exam Updated Vital Signs BP (!) 175/71   Pulse 61   Temp 97.8 F (36.6 C) (Oral)   Resp 19   SpO2 94%   Physical Exam  Constitutional: She is oriented to person, place, and time. She appears well-developed and well-nourished. No distress.  HENT:  Head: Normocephalic. Head is with contusion.    Right Ear: Hearing normal.  Left Ear: Hearing normal.  Nose: Nose normal.  Mouth/Throat: Oropharynx is clear and moist and mucous membranes are normal.  Eyes: Pupils are equal, round, and reactive to light. Conjunctivae and EOM are normal.  Neck: Normal range of motion. Neck supple. No spinous process tenderness and no muscular tenderness present.  Cardiovascular: Regular rhythm, S1 normal and S2 normal. Exam reveals no gallop and no friction rub.  No murmur heard. Pulmonary/Chest: Effort normal and breath sounds normal. No respiratory distress. She exhibits no tenderness.  Abdominal: Soft. Normal appearance and bowel sounds are normal. There is no hepatosplenomegaly. There is no tenderness. There is no rebound, no guarding, no tenderness at McBurney's point and negative Murphy's sign. No hernia.  Musculoskeletal: Normal range of motion.       Right elbow: She exhibits laceration (Skin tear). She exhibits normal range of motion, no swelling and no deformity. No tenderness found.        Right hip: Normal.       Left hip: Normal.  Neurological: She is alert and oriented to person, place, and time. She has normal strength. No cranial nerve deficit or sensory deficit. Coordination normal. GCS eye subscore is 4. GCS verbal subscore is 5. GCS motor subscore is 6.  Skin: Skin is warm, dry and intact. No rash noted. No cyanosis.  Psychiatric: She has a normal mood and affect. Her speech is normal and behavior is normal. Thought content normal.  Nursing note and vitals reviewed.    ED Treatments / Results  Labs (all labs ordered are listed, but only abnormal results are displayed) Labs Reviewed  CBC WITH DIFFERENTIAL/PLATELET - Abnormal; Notable for the following components:      Result Value   RBC 5.32 (*)    MCH 24.8 (*)    All other components within normal limits  BASIC METABOLIC PANEL - Abnormal; Notable for the following components:   Potassium 3.1 (*)    Glucose, Bld 123 (*)    Creatinine, Ser 1.02 (*)    Calcium 8.8 (*)    GFR calc non Af Amer 48 (*)    GFR calc Af Amer 56 (*)    All other components within normal limits  BRAIN NATRIURETIC PEPTIDE - Abnormal; Notable for the following components:   B Natriuretic Peptide 845.8 (*)    All other components within normal limits  URINALYSIS, ROUTINE W REFLEX MICROSCOPIC - Abnormal; Notable for the following components:   APPearance HAZY (*)    Protein, ur 100 (*)    Nitrite POSITIVE (*)    Leukocytes, UA LARGE (*)    WBC, UA >50 (*)    Bacteria, UA MANY (*)  All other components within normal limits  URINE CULTURE  CULTURE, BLOOD (ROUTINE X 2)  CULTURE, BLOOD (ROUTINE X 2)  I-STAT TROPONIN, ED  I-STAT CG4 LACTIC ACID, ED    EKG EKG Interpretation  Date/Time:  Friday June 05 2018 00:32:14 EDT Ventricular Rate:  66 PR Interval:    QRS Duration: 104 QT Interval:  408 QTC Calculation: 428 R Axis:   107 Text Interpretation:  Sinus rhythm Ventricular premature complex Prolonged PR interval Left  posterior fascicular block Borderline repolarization abnormality No significant change since last tracing Confirmed by Orpah Greek [phone]) on 06/05/2018 12:35:48 AM   Radiology Dg Chest 2 View  Result Date: 06/05/2018 CLINICAL DATA:  Dyspnea and syncope EXAM: CHEST - 2 VIEW COMPARISON:  05/15/2018 FINDINGS: Stable cardiomegaly with aortic atherosclerosis. Stable mild interstitial prominence compatible with known fibrosis. Probable scarring at the left lung base. No overt pulmonary edema, pulmonary consolidation, effusion or pneumothorax. IMPRESSION: Stable cardiomegaly with aortic atherosclerosis. Chronic interstitial prominence compatible with known fibrosis. Probable scarring at the left lung base. No active pulmonary disease. Electronically Signed   By: Ashley Royalty M.D.   On: 06/05/2018 01:20   Ct Head Wo Contrast  Result Date: 06/05/2018 CLINICAL DATA:  84 year old female with fall and trauma to the head. EXAM: CT HEAD WITHOUT CONTRAST TECHNIQUE: Contiguous axial images were obtained from the base of the skull through the vertex without intravenous contrast. COMPARISON:  Head CT dated 05/31/2008 FINDINGS: Brain: There is mild age-related atrophy and chronic microvascular ischemic changes. There is no acute intracranial hemorrhage. No mass effect midline shift no extra-axial fluid collection. Vascular: No hyperdense vessel or unexpected calcification. Skull: Normal. Negative for fracture or focal lesion. Sinuses/Orbits: Minimal mucoperiosteal thickening of paranasal sinuses. No air-fluid levels. Minimal bilateral mastoid effusions. Other: Right forehead hematoma. IMPRESSION: 1. No acute intracranial pathology. 2. Mild age-related atrophy and chronic microvascular ischemic changes. Electronically Signed   By: Anner Crete M.D.   On: 06/05/2018 01:42    Procedures Procedures (including critical care time)  Medications Ordered in ED Medications  Tdap (BOOSTRIX) injection 0.5 mL (0.5  mLs Intramuscular Not Given 06/05/18 0309)  cefTRIAXone (ROCEPHIN) 2 g in sodium chloride 0.9 % 100 mL IVPB (has no administration in time range)     Initial Impression / Assessment and Plan / ED Course  I have reviewed the triage vital signs and the nursing notes.  Pertinent labs & imaging results that were available during my care of the patient were reviewed by me and considered in my medical decision making (see chart for details).    Patient presents to the emergency department for evaluation of syncopal episode.  Patient reports that she had been watching a movie and got up to go to bed.  She walked down the hallway to her room, felt woozy and then woke up on the floor.  Patient has a large contusion of her forehead.  CT head unremarkable.  No other injury from the fall other than skin tear to the right arm.  Patient was hypoxic at arrival.  Her normal baseline O2 sats are 88%.  Her BNP is elevated above her baseline.  She does have a history of diastolic heart failure.  I suspect that she might be slightly volume overloaded.  She does also, however, have a urinary tract infection.  She was initiated on Rocephin for UTI.    Patient's daughter reports that she heard her fall and ran to her room.  When she found her on  the floor she was having some shaking movements of her arms.  No history of seizure.  No postictal state.  She does have PVCs and ectopy noted on monitor here in the ER.  Her pre-existing CHF does put her at risk for arrhythmia, will require hospitalization for monitoring after syncope.  Final Clinical Impressions(s) / ED Diagnoses   Final diagnoses:  Syncope, unspecified syncope type  Urinary tract infection without hematuria, site unspecified    ED Discharge Orders    None       Orpah Greek, MD 06/05/18 [phone]

## 2018-06-05 NOTE — Evaluation (Signed)
Physical Therapy Evaluation and Discharge Patient Details Name: [name] MRN: [id number] DOB: 1928-11-29 Today's Date: 06/05/2018   History of Present Illness  84 y.o. female with medical history significant of COPD, oxygen dependent, HTN, HLD, CAD s/p PCI, hypothyroidism, and diastolic CHF followed by Dr. Haroldine [name]; who presents after having a syncopal episode at home. CT scan of the brain showed no acute abnormalities.  Chest x-ray showed stable cardiomegaly without signs of edema.  Urinalysis shows signs of infection.  Clinical Impression  PTA pt lived alone and independent with ADLs, orders groceries online and picks them up. Pt also participates in pulmonary rehab. Pt currently limited in safe mobility by increased oxygen desaturation with mobility (see General Comments). Pt is mod I for bed mobility and transfers and min guard for ambulation without AD due to fast and precipitous drop in oxygen saturation to 74%O2 when ambulating on 10 L O2. Pt does not require PT for functional mobility at d/c but should follow up with physician and pulmonary rehab to determine safe supplemental oxygen needs for ambulation especially in the community. Mobility needs can be addressed by mobility tech acutely.     Follow Up Recommendations Other (comment);Supervision for mobility/OOB(continue with pulmonary rehab )    Equipment Recommendations  None recommended by PT    Recommendations for Other Services       Precautions / Restrictions Precautions Precautions: Fall Restrictions Weight Bearing Restrictions: No      Mobility  Bed Mobility Overal bed mobility: Modified Independent                Transfers Overall transfer level: Modified independent               General transfer comment: strong power up and steadying  Ambulation/Gait Ambulation/Gait assistance: Min guard Gait Distance (Feet): 450 Feet Assistive device: None Gait Pattern/deviations: Step-through  pattern Gait velocity: WFL Gait velocity interpretation: >4.37 ft/sec, indicative of normal walking speed General Gait Details: min guard for safety, L foot collapse with weightbearing and pt circumducts L hip to advance L foot        Balance Overall balance assessment: Mild deficits observed, not formally tested                                           Pertinent Vitals/Pain Pain Assessment: No/denies pain    Home Living Family/patient expects to be discharged to:: Private residence Living Arrangements: Alone Available Help at Discharge: Family;Available 24 hours/day Type of Home: House Home Access: Stairs to enter Entrance Stairs-Rails: Can reach both Entrance Stairs-Number of Steps: 3 Home Layout: One level Home Equipment: Grab bars - toilet;Grab bars - tub/shower;Walker - 4 wheels      Prior Function Level of Independence: Independent with assistive device(s)               Hand Dominance   Dominant Hand: Right    Extremity/Trunk Assessment   Upper Extremity Assessment Upper Extremity Assessment: Generalized weakness    Lower Extremity Assessment Lower Extremity Assessment: Generalized weakness       Communication   Communication: No difficulties  Cognition Arousal/Alertness: Awake/alert Behavior During Therapy: WFL for tasks assessed/performed Overall Cognitive Status: Within Functional Limits for tasks assessed  General Comments General comments (skin integrity, edema, etc.): Pt on 8L O2 via HFNC at entry, SaO2 82%O2, increased to 10 L O2 and instructed in pursed lipped breathing and within 1 min SaO2 increased to 95%O2, ambulated 200 feet at regular walking speed and SaO2 dropped to 74%O2 with max HR of 110 bpm, requiring instruction for pursed lipped breathing and increased supplemental O2 to 12L, after 1 min SaO2 90%O2, ambulated back to room at much slower velocity and pt able  to maintain SaO2 >90%O2, standing in room SaO2 dropped to 82%O2 and required increased breathing to return to 90%O2, once seated in recliner able to wean pt back to 8L O2 with SaO2 >90%O2        Assessment/Plan    PT Assessment Patent does not need any further PT services(pt current with pulmonary rehab and should continue)         PT Goals (Current goals can be found in the Care Plan section)  Acute Rehab PT Goals Patient Stated Goal: [place] PT Goal Formulation: With patient/family     AM-PAC PT "6 Clicks" Daily Activity  Outcome Measure Difficulty turning over in bed (including adjusting bedclothes, sheets and blankets)?: None Difficulty moving from lying on back to sitting on the side of the bed? : None Difficulty sitting down on and standing up from a chair with arms (e.g., wheelchair, bedside commode, etc,.)?: None Help needed moving to and from a bed to chair (including a wheelchair)?: None Help needed walking in hospital room?: None Help needed climbing 3-5 steps with a railing? : A Little 6 Click Score: 23    End of Session Equipment Utilized During Treatment: Oxygen;Gait belt Activity Tolerance: Patient tolerated treatment well Patient left: in chair;with call bell/phone within reach;with family/visitor present Nurse Communication: Mobility status;Other (comment)(oxygen desaturation) PT Visit Diagnosis: Difficulty in walking, not elsewhere classified (R26.2)    Time: 1330-1402 PT Time Calculation (min) (ACUTE ONLY): 32 min   Charges:   PT Evaluation $PT Eval Moderate Complexity: 1 Mod PT Treatments $Gait Training: 8-22 mins   PT G Codes:        [name] B. [name] PT, DPT Acute Rehabilitation  [phone] Pager [phone]    [place] 06/05/2018, 3:05 PM

## 2018-06-05 NOTE — Consult Note (Addendum)
Cardiology Consultation:   Patient ID: DALAYAH DEAHL; [id number]; 05/22/1929   Admit date: 06/05/2018 Date of Consult: 06/05/2018  Primary Care Provider: [name], MD Primary Cardiologist: Aiven Kampe Martinique, MD   Patient Profile:   [name] is a 84 y.o. female with a hx of CAD s/p remote angioplasty of RCA 9371, diastolic CHF, pulmonary hypertension, COPD on home oxygen, PAD, carotid artery disease s/p right CEA, chronic left subclavian occlusion, hyperthyroidism, hypertension, hyperlipidemia who is being seen today for the evaluation of syncope and heart failure at the request of Dr Loleta Books.  History of Present Illness:   [name] is currently admitted for syncope and heart failure.  She lives alone and is fairly independent, doing her own light housework and cooking.  She volunteers at the hospital 2 days/week and attends pulmonary rehab 2 days/week.  She has a history of syncope with her first episode 4-5 years ago.  She has had 5 or 6 falls since then, to this year.  She states that last night she was getting ready for bed and had turned her oxygen down from 8 L to 5 L as she does before bed.  She had just brushed her teeth in the bathroom when she became lightheaded and her vision began to go black as she was walking to her bedroom.  The next thing she knew she was on the floor and her daughter was waking her up.  She denies any chest pain, palpitations or shortness of breath associated with the episode.  Her daughter is present and was near her when she fell.  She noted that when the patient was on the floor and unconscious her hands were balled up and drawn up to her chin and shaking.  The patient hit her head and has a hematoma on the right forehead.  She recovered quickly and was alert.  She feels that all of her syncopal echo episodes are related to low oxygen.  She has not felt that her fluid status has been up.  She has chronic mild lower extremity edema for which she wears  compression stockings.  She says that her Lasix at home does not seem to cause increased urination.  Her BNP was elevated at 845.8.  Troponins were negative x3.  Chest x-ray did not show any pulmonary edema.  She was given Lasix 20 mg IV x1 and she reports good urine output although only 750 mL as documented.  Weight is unchanged from a week ago at the office.  She was seen by Dr. Haroldine Laws 05/11/2018 for evaluation of her chronic hypoxic respiratory failure with possible PAH.  Dr. Haroldine Laws reviewed her extensive testing and noted that she has a diffusion defect that seems well out of proportion to her parenchymal lung disease and spirometry.  Her echo was not consistent with PAH.  She was noted to have a small atrial septal aneurysm but no evidence of PAH or RV strain.  It was decided not to perform a right heart cath as it was not likely to add anything to her treatment plan.  VQ scan was done and showed no acute or chronic PE.  Tadalafil seemed to be making her shunting worse with slight decrease in oxygenation so this was stopped.  She was continued on supplemental oxygen and advised on pulmonary rehab.  She was just recently seen on 06/01/2018 by Dr. Martinique at which time it was noted that her palpitations that she has had in the past were minimal.  Of  note she has worn a home monitor a few times, last time being in 10/07/2017, and always shows normal sinus rhythm with PACs.  She was remaining active, doing volunteer work.  Her claudication was better since being on oxygen.  She had no increased edema and was continued on her usual Lasix 20 mg daily.  She has a history of secondary polycythemia and had to have phlebotomy one time in the past.  She says that this is been improved since she has been on oxygen.  Per pulmonology notes her diffusion capacity on PFTs is decreased out of proportion to degree of airway obstruction as noted by FEV1.  Her dyspnea is out of proportion to her degree of COPD and having  quit smoking in 1995.  She is using 8 L of oxygen during the day, turned out to 5-6 L at night.  She has a history of smoking for 43 years, 2 packs/day, but quit in 03/1994.  Past Medical History:  Diagnosis Date  . Arthritis   . B12 deficiency   . Basal cell cancer    Nose  . Carotid artery occlusion   . Cataract   . Chronic diastolic CHF (congestive heart failure) ([place])   . Colon polyps   . COPD (chronic obstructive pulmonary disease) ([place])    STAGE 2  . Coronary artery disease    status post angioplasty of the mid RCA in 1995  . Dyspnea   . First degree atrioventricular block   . History of obesity   . Hyperlipidemia   . Hypertension    well controlled  . Hyperthyroidism   . Lichen sclerosus et atrophicus of the vulva   . Osteopenia   . PAD (peripheral artery disease) ([facility])    with stable claudication  . Pulmonary fibrosis ([place])    wears 4L home O2 at baseline    Past Surgical History:  Procedure Laterality Date  . CAROTID ENDARTERECTOMY Right 2002  . CATARACT EXTRACTION W/ INTRAOCULAR LENS  IMPLANT, BILATERAL    . CHOLECYSTECTOMY  1999  . ENDARTERECTOMY     right carotid endarterectomy  . KNEE ARTHROSCOPY Left   . MENISCUS REPAIR Left 10/29/2012  . OPEN REDUCTION INTERNAL FIXATION (ORIF) DISTAL RADIAL FRACTURE Left 11/08/2016   Procedure: OPEN TREATMENT OF LEFT DISTAL RADIUS FRACTURE;  Surgeon: Milly Jakob, MD;  Location: [facility];  Service: Orthopedics;  Laterality: Left;  . REPAIR TENDONS FOOT       Home Medications:  Prior to Admission medications   Medication Sig Start Date End Date Taking? Authorizing Provider  acetaminophen (TYLENOL) 500 MG tablet Take 1,000 mg by mouth at bedtime.    Yes [provider]  Aloe-Sodium Chloride (AYR SALINE NASAL GEL NA) Place 1 application into the nose daily as needed (dryness).   Yes [provider]  Celedonio Miyamoto 62.5-25 MCG/INH AEPB INHALE 1 PUFF INTO THE LUNGS DAILY 04/14/18  Yes Rigoberto Noel, MD    aspirin 81 MG tablet Take 81 mg by mouth 2 (two) times daily.    Yes [provider]  Carboxymethylcellul-Glycerin (LUBRICATING EYE DROPS OP) Apply 1 drop to eye daily as needed (dry eyes).   Yes [provider]  denosumab (PROLIA) 60 MG/ML SOLN injection Inject 60 mg into the skin every 6 (six) months. Administer in upper arm, thigh, or abdomen   Yes [provider]  furosemide (LASIX) 20 MG tablet Take 1 tablet (20 mg total) by mouth daily. 02/24/18  Yes Martinique, Jaimie Redditt M, MD  isosorbide mononitrate (IMDUR) 30 MG 24 hr tablet TAKE 1 TABLET(30 MG) BY MOUTH DAILY 11/21/17  Yes Martinique, Cashus Halterman M, MD  levothyroxine (SYNTHROID, LEVOTHROID) 137 MCG tablet Take 137 mcg by mouth daily.     Yes [provider]  metoprolol tartrate (LOPRESSOR) 50 MG tablet Take 1 tablet (50 mg total) by mouth 2 (two) times daily. 01/15/18  Yes Martinique, Callen Vancuren M, MD  multivitamin-lutein [facility]) CAPS capsule Take 1 capsule by mouth daily.   Yes [provider]  nitroGLYCERIN (NITROSTAT) 0.4 MG SL tablet Place 1 tablet (0.4 mg total) under the tongue every 5 (five) minutes as needed for chest pain (MAX 3 doses). 06/01/18  Yes Martinique, Aerin Delany M, MD  OXYGEN Inhale 8 L into the lungs daily.    Yes [provider]  Probiotic Product (PROBIOTIC DAILY PO) Take 1 tablet by mouth daily.    Yes [provider]  rosuvastatin (CRESTOR) 40 MG tablet Take 20 mg by mouth at bedtime.    Yes [provider]    Inpatient Medications: Scheduled Meds: . aspirin EC  81 mg Oral Daily  . enoxaparin (LOVENOX) injection  40 mg Subcutaneous Daily  . isosorbide mononitrate  30 mg Oral Daily  . levothyroxine  137 mcg Oral QAC breakfast  . losartan  25 mg Oral Daily  . metoprolol tartrate  50 mg Oral BID  . potassium chloride  40 mEq Oral Once  . rosuvastatin  20 mg Oral QHS  . sodium chloride flush  3 mL Intravenous Q12H  . Tdap  0.5 mL Intramuscular Once  .  umeclidinium-vilanterol  1 puff Inhalation Daily   Continuous Infusions: . [START ON 06/06/2018] cefTRIAXone (ROCEPHIN)  IV     PRN Meds: acetaminophen **OR** acetaminophen, hydroxypropyl methylcellulose / hypromellose, ipratropium-albuterol, nitroGLYCERIN, ondansetron **OR** ondansetron (ZOFRAN) IV  Allergies:    Allergies  Allergen Reactions  . Tape Other (See Comments)    Pulls skin    Social History:   Social History   Socioeconomic History  . Marital status: Widowed    Spouse name: Not on file  . Number of children: 3  . Years of education: Not on file  . Highest education level: Not on file  Occupational History  . 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 Financial resource strain: Not on file  . Food insecurity:    Worry: Not on file    Inability: Not on file  . Transportation needs:    Medical: Not on file    Non-medical: Not on file  Tobacco Use  . Smoking status: Former Smoker    Packs/day: 1.00    Years: 40.00    Pack years: 40.00    Types: Cigarettes    Last attempt to quit: 11/18/1993    Years since quitting: 24.5  . Smokeless tobacco: Never Used  Substance and Sexual Activity  . Alcohol use: No    Alcohol/week: 0.0 oz  . Drug use: No  . Sexual activity: Not Currently  Lifestyle  . Physical activity:    Days per week: Not on file    Minutes per session: Not on file  . Stress: Not on file  Relationships  . Social connections:    Talks on phone: Not on file    Gets together: Not on file    Attends religious service: Not on file    Active member of club or organization: Not on file    Attends meetings of clubs or organizations:  Not on file    Relationship status: Not on file  . Intimate partner violence:    Fear of current or ex partner: Not on file    Emotionally abused: Not on file    Physically abused: Not on file    Forced sexual activity: Not on file  Other Topics Concern  . Not on file  Social History Narrative  . Not on  file    Family History:    Family History  Problem Relation Age of Onset  . Heart attack Father 58  . Hypertension Father   . Heart disease Brother        underwent coronary bypass grafting at age 110  . Hypertension Brother   . Diabetes Sister   . Breast cancer Maternal Aunt      ROS:  Please see the history of present illness.   All other ROS reviewed and negative.     Physical Exam/Data:   Vitals:   06/05/18 0628 06/05/18 0743 06/05/18 0828 06/05/18 1135  BP: (!) 177/68 (!) 163/67  114/69  Pulse: 64 68  (!) 56  Resp:  18  18  Temp:  (!) 97.3 F (36.3 C)  98 F (36.7 C)  TempSrc:  Oral  Oral  SpO2:  97% 95% 93%  Weight:      Height:        Intake/Output Summary (Last 24 hours) at 06/05/2018 1340 Last data filed at 06/05/2018 1053 Gross per 24 hour  Intake 160 ml  Output 750 ml  Net -590 ml   Filed Weights   06/05/18 0626  Weight: 150 lb 3.2 oz (68.1 kg)   Body mass index is 26.61 kg/m.  General:  Well nourished, well developed, in no acute distress HEENT: normal, hematoma of right forehead Lymph: no adenopathy Neck: no JVD Endocrine:  No thryomegaly Vascular: Faint right carotid bruit; pedal pulses 2+ Cardiac:  normal S1, S2; RRR; no murmur  Lungs:  clear to auscultation bilaterally, no wheezing, rhonchi or rales  Abd: soft, nontender, no hepatomegaly  Ext: Trace ankle edema Musculoskeletal:  No deformities, BUE and BLE strength normal and equal Skin: warm and dry  Neuro:  CNs 2-12 intact, no focal abnormalities noted Psych:  Normal affect   EKG:  The EKG was personally reviewed and demonstrates: Sinus rhythm with nonspecific T changes, poor tracing Telemetry:  Telemetry was personally reviewed and demonstrates: Sinus rhythm in the 60s with occasional PVCs, no significant arrhythmias  Relevant CV Studies:  Echo: 12/29/15: Study Conclusions - Left ventricle: The cavity size was normal. Wall thickness was normal. Systolic function was normal. The  estimated ejection fraction was in the range of 60% to 65%. Features are consistent with a pseudonormal left ventricular filling pattern, with concomitant abnormal relaxation and increased filling pressure (grade 2 diastolic dysfunction). Doppler parameters are consistent with high ventricular filling pressure. - Mitral valve: Calcified annulus. Mildly thickened leaflets. Valve area by continuity equation (using LVOT flow): 1.83 cm^2. - Pulmonary arteries: PA peak pressure: 64 mm Hg (S).  Event monitor 09/29/17 Study Highlights   Normal sinus rhythm  Isolated PACs   Carotid Dopplers 10/15/2017 1. >  50% left ECA stenosis 2.  Patent right carotid endarterectomy with no evidence for restenosis 3.  40-59% left internal carotid artery stenosis 4.  Known left subclavian arterial occlusive disease  Compared to previous: Slight progression of left ICA stenosis compared to previous exam in 10/02/2016  Echo 02/09/18:  Study Conclusions - Left ventricle: The cavity  size was normal. Wall thickness wasincreased in a pattern of mild LVH. Systolic function was normal.The estimated ejection fraction was in the range of 60% to 65%.Wall motion was normal; there were no regional wall motionabnormalities. Doppler parameters are consistent with abnormalleft ventricular relaxation (grade 1 diastolic dysfunction). - Aortic valve: There was no stenosis. - Mitral valve: Moderately calcified annulus. Mildly calcified leaflets . There was trivial regurgitation. - Left atrium: The atrium was mildly dilated. - Right ventricle: The cavity size was mildly dilated. Systolicfunction was normal. - Atrial septum: On bubble study, there are a few bubbles crossfrom right to left. These may be late bubbles consistent withpulmonary AVMs though they appear earlier on repeat study withValsalva. - Pulmonary arteries: No complete TR doppler jet so unable toestimate PA systolic pressure. - Inferior vena cava: The  vessel was normal in size. Therespirophasic diameter changes were in the normal range (>= 50%),consistent with normal central venous pressure.  Impressions: - Normal LV size with mild LV hypertrophy, EF 60-65%. Mildlydilated RV with normal systolic function. No significant valvularabnormalities. Bubble study with some bubbles crossing.Initially, looked late (like pulmonary AVMs) but repeat studywith valsalva seemed to show earlier crossing.   Laboratory Data:  Chemistry Recent Labs  Lab 06/05/18 0053  NA 142  K 3.1*  CL 106  CO2 23  GLUCOSE 123*  BUN 19  CREATININE 1.02*  CALCIUM 8.8*  GFRNONAA 48*  GFRAA 56*  ANIONGAP 13    No results for input(s): PROT, ALBUMIN, AST, ALT, ALKPHOS, BILITOT in the last 168 hours. Hematology Recent Labs  Lab 06/05/18 0053  WBC 8.4  RBC 5.32*  HGB 13.2  HCT 42.6  MCV 80.1  MCH 24.8*  MCHC 31.0  RDW 14.4  PLT 170   Cardiac Enzymes Recent Labs  Lab 06/05/18 0523 06/05/18 1116  TROPONINI <0.03 <0.03    Recent Labs  Lab 06/05/18 0054  TROPIPOC 0.04    BNP Recent Labs  Lab 06/05/18 0053  BNP 845.8*    DDimer No results for input(s): DDIMER in the last 168 hours.  Radiology/Studies:  Dg Chest 2 View  Result Date: 06/05/2018 CLINICAL DATA:  Dyspnea and syncope EXAM: CHEST - 2 VIEW COMPARISON:  05/15/2018 FINDINGS: Stable cardiomegaly with aortic atherosclerosis. Stable mild interstitial prominence compatible with known fibrosis. Probable scarring at the left lung base. No overt pulmonary edema, pulmonary consolidation, effusion or pneumothorax. IMPRESSION: Stable cardiomegaly with aortic atherosclerosis. Chronic interstitial prominence compatible with known fibrosis. Probable scarring at the left lung base. No active pulmonary disease. Electronically Signed   By: Ashley Royalty M.D.   On: 06/05/2018 01:20   Ct Head Wo Contrast  Result Date: 06/05/2018 CLINICAL DATA:  84 year old female with fall and trauma to the head.  EXAM: CT HEAD WITHOUT CONTRAST TECHNIQUE: Contiguous axial images were obtained from the base of the skull through the vertex without intravenous contrast. COMPARISON:  Head CT dated 05/31/2008 FINDINGS: Brain: There is mild age-related atrophy and chronic microvascular ischemic changes. There is no acute intracranial hemorrhage. No mass effect midline shift no extra-axial fluid collection. Vascular: No hyperdense vessel or unexpected calcification. Skull: Normal. Negative for fracture or focal lesion. Sinuses/Orbits: Minimal mucoperiosteal thickening of paranasal sinuses. No air-fluid levels. Minimal bilateral mastoid effusions. Other: Right forehead hematoma. IMPRESSION: 1. No acute intracranial pathology. 2. Mild age-related atrophy and chronic microvascular ischemic changes. Electronically Signed   By: Anner Crete M.D.   On: 06/05/2018 01:42    Assessment and Plan:   Acute on chronic diastolic heart  failure -Troponins negative x3 -BNP 845 -Chest x-ray showed stable cardiomegaly with aortic atherosclerosis.  Chronic interstitial prominence compatible with known fibrosis.  No active pulmonary disease. -Patient did not perceive to have extra volume at time of presentation.  She has chronic ankle edema that was not increased from her usual.  Her weight is stable from her last office visit a week ago and 8 pounds less from January office visit. -The patient reports that she does not feel any increased urination after she takes her Lasix at home.  She does have a chronic cough which she says is not as frequent recently. -Has been given one dose of lasix 20 mg IV with good UOP per pt report- not documented. Pt denies shortness of breath and feels that her fluid status is at baseline.  -Will recheck echo to assess diastolic function and pulmonary artery pressures. -Will resume her home oral lasix 20 mg.  Syncope -She has a history of 5-6 falls in the last 4-5 years.  Reportedly related to hypoxic  episodes. -Patient had syncope last night while walking after she had just turned her oxygen down from 8 L to 5 L in preparation for bed.  She has a hematoma on the right forehead.  She had no associated chest pain, shortness of breath, palpitations. -Prior outpatient monitoring has only shown SR with PACs and no arrhythmias on tele since arrival. -CT of the head showed no acute intracranial pathology, chronic microvascular changes. -Does not seem to be cardiac related. -She may need to maintain her oxygen flow at 8 L until she is actually laying down in the bed before turning it down to her sleep level of 5-6 L.  Carotid artery disease -s/p right carotid endarterectomy 2002 -Most recent carotid studies showed left ECA > 50% stenosis, patent right CEA with no evidence for restenosis, 40-59% left ICA stenosis.  Known left subclavian artery occlusive disease  CAD -Remote history of angioplasty of RCA in 1995 -Currently treated with aspirin 81 mg, Imdur 30 mg, beta-blocker and high intensity statin -No recent angina.  No chest pain or shortness of breath associated with her syncopal episode. -She is active, living alone and taking care of herself, volunteering at the hospital and doing pulmonary rehab without exertional symptoms.  Hypertension -Home medications include Lopressor 50 mg twice daily, Lasix 20 mg daily, Imdur 30 mg daily -Blood pressure has been elevated just after arrival but is better today 114/69  Hyperlipidemia -On Crestor 20 mg daily  Hypokalemia -K+ 3.1 today. Replacement ordered.  Recheck tomorrow   For questions or updates, please contact [place] Please consult [email or web address] for contact info under Cardiology/STEMI.   Signed, Daune Perch, NP  06/05/2018 1:40 PM   Patient examined chart reviewed. Discussed care with patient, son, daughter and PA. 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 Exam with largy  ecchymosis over forehead Lungs clear JVP elevated no murmur bruise on LLE.  Biggest issue appears to be transient hypoxia with likely  Elevations in her PA pressures. Previous trial of vasodilators thought to cause more shunting May have a form of platypnea orthodeoxia but no reversible causes evident Recent V/Q with no evidence of PE. Bubble study from March with trivial late crossing not nearly bad enough to suggest large pulmonary AVM. There is no evidence of acute ischemic event or arrhythmia. Continue telemetry repeat TTE to try to estimate PA pressure and make sure RV/LV  function still normal. Otherwise try to optimize oxygenation and consider pulmonary consultation   Jenkins Rouge

## 2018-06-05 NOTE — Progress Notes (Signed)
PROGRESS NOTE    [name]  ZOX:[id number] DOB: 23-Feb-1929 DOA: 06/05/2018 PCP: [name], MD  Outpatient Specialists:     Brief Narrative:  84 yo female with a PMH significant for COPD with O2 dependence, HTN, HLD, CAD s/p PCI, hypothyroidism, and diastolic CHF is here due to single syncopal episode at home from sitting to floor resulting in right-sided forehead hematoma. The patient states she recently changed her O2 from 8L to 5L at night time, based on medical instruction. Syncopal episode back in March due to oxygenation running out. The patient was admitted for further workup for her syncopal episode. CT head wo contrast showed no acute intracranial pathology. CXR shows no active pulmonary disease. EKG showed NSR with first degree heart block and left posterior fascicular block. Echo from 02/09/18 showed EF 60-65%, mild LVH, mild dilated RV with normal systolic function and no significant valvular abnormalities. Cardiology consult appreciated.  Assessment & Plan:   Principal Problem:   Syncope Active Problems:   Hypertension   Chronic diastolic CHF (congestive heart failure) (HCC)   COPD GOLD II    Chronic respiratory failure with hypoxia (HCC)   Acute lower UTI   Syncope: -Patient endorses decreasing her oxygen at home from 9L to 5L at night based on medical instruction. -CT scan shows 1. No acute intracranial pathology. 2. Mild age-related atrophy and chronic microvascular ischemic changes. -EKG shows NSR with first degree heart block and left posterior fascicular block -Troponin <0.30 -Stable on telemetry -Cardiology consulted, appreciate input  Urinary tract infection: -Urine culture collected, pending -Continue Rocephin -Patient endorsed burning with urination for 4-5 days prior to syncope  Chronic diastolic CHF: -BNP 811.9 (01/27/18 305.0) -Echo 02/09/18 Normal LV size with mild LV hypertrophy, EF 60-65%. Mildly dilated RV with normal systolic function. No  significant valvular abnormalities. Bubble study with some bubbles crossing. Initially, looked late (like pulmonary AVMs) but repeat study with valsalva seemed to show earlier crossing. -No peripheral edema on exam. Lungs are clear bilaterally. -Continue lasix -Continue statin  COPD wo exacerbation with chronic respiratory failure and hypoxia: -Patient condition currently improved and stable, O2 saturation is 91% on 6 L via [place] -Denies SOB and dyspnea -Continue to monitor pulse oximetry  -Duoneb prn for SOB/wheezing -Denies dizziness and lightheadedness while standing and walking to the bathroom. Patient will be walked down the hallway in order to monitor any changes in oxygen saturation from her baseline.   Hypertension: -BP stable, continue metoprolol, losartan, isosorbide mononitrate  Hyperthyroidism: -TSH WNL at 1.006 -continue synthroid  DVT prophylaxis: Lovenox Code Status: Full Family Communication: No family present at bedside during interview. Disposition Plan: Pending cardiology consult and how patient tolerates walking the hallways without severe drop in oxygen saturation, patient will be discharged home with instructions to keep oxygen at 9L following completion of IV antibiotics.   Consultants:   Cardiology  Procedures:   None  Antimicrobials:   Rocephin   Subjective: Patient was lying supine in hospital bed alert and oriented in no acute distress. Negative ROS. Endorses walking to bathroom without difficult and no feelings of dizziness or lightheadedness. States her oxygen saturations moderately fluctuate when she is active, dropping to the high 70s, which is normal for her. Endorses taking her inhaler, Lasix, Synthroid, Metoprolol, and Isosorbide everyday. Goes to therapy for her breathing weekly where she uses an oxygen mask at 25L during intense physical activity and then 15L at moderate physical activity. Lives alone with no stairs inside her home. Still  drives.   Objective: Vitals:   06/05/18 0415 06/05/18 0626 06/05/18 0628 06/05/18 0743  BP: (!) 152/60 (!) 181/68 (!) 177/68 (!) 163/67  Pulse: 64 67 64 68  Resp:  20  18  Temp:  (!) 97.5 F (36.4 C)  (!) 97.3 F (36.3 C)  TempSrc:  Oral  Oral  SpO2: 96% 91%  97%  Weight:  68.1 kg (150 lb 3.2 oz)    Height:  5\' 3"  (1.6 m)      Intake/Output Summary (Last 24 hours) at 06/05/2018 0751 Last data filed at 06/05/2018 0737 Gross per 24 hour  Intake -  Output 400 ml  Net -400 ml   Filed Weights   06/05/18 0626  Weight: 68.1 kg (150 lb 3.2 oz)    Examination:  General exam: Appears calm and comfortable. Obvious right-sided hematoma on forehead. Respiratory system: Clear to auscultation. Respiratory effort normal. Cardiovascular system: S1 & S2 heard, RRR. No JVD, murmurs, rubs, gallops or clicks. No pedal edema. Gastrointestinal system: Abdomen is nondistended, soft and nontender. No organomegaly or masses felt. Normal bowel sounds heard. Central nervous system: Alert and oriented. No focal neurological deficits. Extremities: Symmetric 5 x 5 power. Skin: No rashes, lesions or ulcers Psychiatry: Judgement and insight appear normal. Mood & affect appropriate.     Data Reviewed: I have personally reviewed following labs and imaging studies  CBC: Recent Labs  Lab 06/05/18 0053  WBC 8.4  NEUTROABS 6.7  HGB 13.2  HCT 42.6  MCV 80.1  PLT 448   Basic Metabolic Panel: Recent Labs  Lab 06/05/18 0053  NA 142  K 3.1*  CL 106  CO2 23  GLUCOSE 123*  BUN 19  CREATININE 1.02*  CALCIUM 8.8*   GFR: Estimated Creatinine Clearance: 36.7 mL/min (A) (by C-G formula based on SCr of 1.02 mg/dL (H)). Liver Function Tests: No results for input(s): AST, ALT, ALKPHOS, BILITOT, PROT, ALBUMIN in the last 168 hours. No results for input(s): LIPASE, AMYLASE in the last 168 hours. No results for input(s): AMMONIA in the last 168 hours. Coagulation Profile: No results for input(s):  INR, PROTIME in the last 168 hours. Cardiac Enzymes: Recent Labs  Lab 06/05/18 0523  TROPONINI <0.03   BNP (last 3 results) No results for input(s): PROBNP in the last 8760 hours. HbA1C: No results for input(s): HGBA1C in the last 72 hours. CBG: No results for input(s): GLUCAP in the last 168 hours. Lipid Profile: No results for input(s): CHOL, HDL, LDLCALC, TRIG, CHOLHDL, LDLDIRECT in the last 72 hours. Thyroid Function Tests: Recent Labs    06/05/18 0523  TSH 1.006   Anemia Panel: No results for input(s): VITAMINB12, FOLATE, FERRITIN, TIBC, IRON, RETICCTPCT in the last 72 hours. Urine analysis:    Component Value Date/Time   COLORURINE YELLOW 06/05/2018 0132   APPEARANCEUR HAZY (A) 06/05/2018 0132   LABSPEC 1.018 06/05/2018 0132   PHURINE 5.0 06/05/2018 0132   GLUCOSEU NEGATIVE 06/05/2018 0132   HGBUR NEGATIVE 06/05/2018 0132   BILIRUBINUR NEGATIVE 06/05/2018 0132   KETONESUR NEGATIVE 06/05/2018 0132   PROTEINUR 100 (A) 06/05/2018 0132   UROBILINOGEN 0.2 05/20/2013 0934   NITRITE POSITIVE (A) 06/05/2018 0132   LEUKOCYTESUR LARGE (A) 06/05/2018 0132   Sepsis Labs: @LABRCNTIP (procalcitonin:4,lacticidven:4)  )No results found for this or any previous visit (from the past 240 hour(s)).       Radiology Studies: Dg Chest 2 View  Result Date: 06/05/2018 CLINICAL DATA:  Dyspnea and syncope EXAM: CHEST - 2 VIEW COMPARISON:  05/15/2018 FINDINGS: Stable cardiomegaly with aortic atherosclerosis. Stable mild interstitial prominence compatible with known fibrosis. Probable scarring at the left lung base. No overt pulmonary edema, pulmonary consolidation, effusion or pneumothorax. IMPRESSION: Stable cardiomegaly with aortic atherosclerosis. Chronic interstitial prominence compatible with known fibrosis. Probable scarring at the left lung base. No active pulmonary disease. Electronically Signed   By: Ashley Royalty M.D.   On: 06/05/2018 01:20   Ct Head Wo Contrast  Result Date:  06/05/2018 CLINICAL DATA:  84 year old female with fall and trauma to the head. EXAM: CT HEAD WITHOUT CONTRAST TECHNIQUE: Contiguous axial images were obtained from the base of the skull through the vertex without intravenous contrast. COMPARISON:  Head CT dated 05/31/2008 FINDINGS: Brain: There is mild age-related atrophy and chronic microvascular ischemic changes. There is no acute intracranial hemorrhage. No mass effect midline shift no extra-axial fluid collection. Vascular: No hyperdense vessel or unexpected calcification. Skull: Normal. Negative for fracture or focal lesion. Sinuses/Orbits: Minimal mucoperiosteal thickening of paranasal sinuses. No air-fluid levels. Minimal bilateral mastoid effusions. Other: Right forehead hematoma. IMPRESSION: 1. No acute intracranial pathology. 2. Mild age-related atrophy and chronic microvascular ischemic changes. Electronically Signed   By: Anner Crete M.D.   On: 06/05/2018 01:42        Scheduled Meds: . aspirin EC  81 mg Oral Daily  . enoxaparin (LOVENOX) injection  40 mg Subcutaneous Daily  . isosorbide mononitrate  30 mg Oral Daily  . levothyroxine  137 mcg Oral QAC breakfast  . losartan  25 mg Oral Daily  . metoprolol tartrate  50 mg Oral BID  . rosuvastatin  20 mg Oral QHS  . sodium chloride flush  3 mL Intravenous Q12H  . Tdap  0.5 mL Intramuscular Once  . umeclidinium-vilanterol  1 puff Inhalation Daily   Continuous Infusions: . [START ON 06/06/2018] cefTRIAXone (ROCEPHIN)  IV       LOS: 0 days    Marney Setting, PA-S Myrene Buddy MD [facility] Pager [phone]  If 7PM-7AM, please contact night-coverage [email or web address] Password [email or web address] 06/05/2018, 7:51 AM

## 2018-06-06 ENCOUNTER — Observation Stay (HOSPITAL_BASED_OUTPATIENT_CLINIC_OR_DEPARTMENT_OTHER): Payer: Medicare Other

## 2018-06-06 DIAGNOSIS — I5032 Chronic diastolic (congestive) heart failure: Secondary | ICD-10-CM | POA: Diagnosis not present

## 2018-06-06 DIAGNOSIS — J9611 Chronic respiratory failure with hypoxia: Secondary | ICD-10-CM | POA: Diagnosis not present

## 2018-06-06 DIAGNOSIS — I5030 Unspecified diastolic (congestive) heart failure: Secondary | ICD-10-CM | POA: Diagnosis not present

## 2018-06-06 DIAGNOSIS — R55 Syncope and collapse: Secondary | ICD-10-CM | POA: Diagnosis not present

## 2018-06-06 DIAGNOSIS — N390 Urinary tract infection, site not specified: Secondary | ICD-10-CM | POA: Diagnosis not present

## 2018-06-06 DIAGNOSIS — I10 Essential (primary) hypertension: Secondary | ICD-10-CM | POA: Diagnosis not present

## 2018-06-06 DIAGNOSIS — J449 Chronic obstructive pulmonary disease, unspecified: Secondary | ICD-10-CM | POA: Diagnosis not present

## 2018-06-06 LAB — BASIC METABOLIC PANEL
Anion gap: 6 (ref 5–15)
BUN: 19 mg/dL (ref 8–23)
CHLORIDE: 109 mmol/L (ref 98–111)
CO2: 28 mmol/L (ref 22–32)
CREATININE: 1.5 mg/dL — AB (ref 0.44–1.00)
Calcium: 9 mg/dL (ref 8.9–10.3)
GFR calc Af Amer: 35 mL/min — ABNORMAL LOW (ref >60)
GFR calc non Af Amer: 30 mL/min — ABNORMAL LOW (ref >60)
Glucose, Bld: 106 mg/dL — ABNORMAL HIGH (ref 70–99)
Potassium: 4.2 mmol/L (ref 3.5–5.1)
Sodium: 143 mmol/L (ref 135–145)

## 2018-06-06 LAB — CBC
HCT: 40.4 % (ref 36.0–46.0)
Hemoglobin: 12.5 g/dL (ref 12.0–15.0)
MCH: 25.1 pg — AB (ref 26.0–34.0)
MCHC: 30.9 g/dL (ref 30.0–36.0)
MCV: 81.1 fL (ref 78.0–100.0)
PLATELETS: 160 10*3/uL (ref 150–400)
RBC: 4.98 MIL/uL (ref 3.87–5.11)
RDW: 14.8 % (ref 11.5–15.5)
WBC: 7.5 10*3/uL (ref 4.0–10.5)

## 2018-06-06 LAB — ECHOCARDIOGRAM COMPLETE
Height: 63 in
WEIGHTICAEL: 2356.8 [oz_av]

## 2018-06-06 LAB — CREATININE, URINE, RANDOM: Creatinine, Urine: 184.58 mg/dL

## 2018-06-06 LAB — SODIUM, URINE, RANDOM: SODIUM UR: 84 mmol/L

## 2018-06-06 MED ORDER — CEPHALEXIN 250 MG PO CAPS
250.0000 mg | ORAL_CAPSULE | Freq: Two times a day (BID) | ORAL | Status: DC
  Administered 2018-06-06 – 2018-06-07 (×3): 250 mg via ORAL
  Filled 2018-06-06 (×3): qty 1

## 2018-06-06 MED ORDER — FUROSEMIDE 20 MG PO TABS: 20.0000 mg | ORAL_TABLET | Freq: Every day | ORAL | Status: DC

## 2018-06-06 MED ORDER — ENOXAPARIN SODIUM 30 MG/0.3ML ~~LOC~~ SOLN: 30.0000 mg | SUBCUTANEOUS | Status: DC

## 2018-06-06 NOTE — Progress Notes (Signed)
  Echocardiogram 2D Echocardiogram has been performed.  Merrie Roof F 06/06/2018, 2:10 PM

## 2018-06-06 NOTE — Care Management Note (Signed)
Case Management Note  Patient Details  Name: LARIE MATHES MRN: [id number] Date of Birth: Oct 12, 1929  Subjective/Objective:  Pt presented for syncope and Acute Hypoxic Respiratory Failure. Pt is from home alone and has support of daughter. Pt has Cane and RW that she does not use. 02 @ 8 Liters via [facility].                  Action/Plan: CM will continue to monitor for disposition needs.   Expected Discharge Date:                  Expected Discharge Plan: Home with Home Health Services  In-House Referral:  NA  Discharge planning Services  CM Consult  Post Acute Care Choice:    Choice offered to:     DME Arranged:    DME Agency:     HH Arranged:    HH Agency:     Status of Service:  In process, will continue to follow  If discussed at Long Length of Stay Meetings, dates discussed:    Additional Comments:  Bethena Roys, RN 06/06/2018, 1:43 PM

## 2018-06-06 NOTE — Consult Note (Signed)
Name: [name] MRN: [id number] DOB: Dec 22, 1928    ADMISSION DATE:  06/05/2018 CONSULTATION DATE:   REFERRING MD :  Dr. Loleta Books, [facility]  CHIEF COMPLAINT: Pulmonary hypertension, acute on chronic hypoxemic respiratory failure  SIGNIFICANT EVENTS   STUDIES:  TTE 12/29/2015 >> normal LV size and wall thickness, evidence for grade 2 diastolic dysfunction, LVEF 60 to 65%, normal right ventricle thickness and function.  Estimated PASP 64 mmHg TTE with bubble study 02/09/2018 >> normal left ventricular size, evidence for LVH.  Normal systolic function 60 to 62%.  Evidence for grade 1 diastolic dysfunction.  Moderately calcified mitral valve annulus.  Mildly dilated RV with normal systolic function.  Some right to left shunting noted on bubble study, possibly AVM versus PFO.  Unable to estimate PA pressures due to absent TR jet TTE 06/06/2018 >> normal LV size, mild LVH.  EF 60 to 65%.  Evidence for grade 1 diastolic dysfunction.  Calcified mitral valve annulus with a severely dilated left atrium.  Moderately dilated right ventricle with normal systolic function.  PA pressures could not be estimated. PFT 01/31/2016 >> moderately severe obstruction without a bronchodilator response, coexisting restriction noted on lung volumes (which may actually cause underestimation of the degree of obstruction), severely decreased diffusion capacity that does not correct when adjusted for the patient's alveolar volume. V/q scan 05/15/2018 >> very low probability study without any unmatched perfusion defects Head CT 7/19 >> no acute changes   HISTORY OF PRESENT ILLNESS: 84 year old woman who has been followed by Dr. Elsworth Soho in our office, by Drs. Bensimhon & [place] with cardiology.  She has a history of COPD, chronic hypoxemic respiratory failure, secondary pulmonary hypertension based on echocardiogram from February 2017.  She also may have a right to left shunt based on subsequent echo done 02/09/2018.  She is on high  flow o2 at baseline 7-8L/min at rest, up to 15L/min with exertion.  She was given a therapeutic trial of tadalafil in May 2019, was unsure that this gave her any clinical benefit.  She then followed up with the advanced heart failure team and given no clear evidence for continued pulmonary hypertension (there was no TR jet) the tadalafil was not continued.  She has been doing cardiopulmonary rehab.  She experienced a syncopal episode which got her readmitted to the hospital 7/19.  Apparently she was stable and then rose from a seated position to walk through her house, passed out and fell to the floor.  She had her oxygen on at the time, but had turned down to 5 L/min in preparation for bed.  Apparently she is been told that she needs to wear a lower dose while sleeping.  Not clear that this is actually correct based on her most recent ONO.  She has been on a stable diuretic dose.  Note that she has some acute renal insufficiency on this admission and her Lasix has been temporarily held. She does have GNR UTI.   She has a chest x-ray from 7/19 that I reviewed, shows some mild interstitial prominence.  This is in light of a CT chest that was done 02/14/2016 that did not show any ILD, did confirm some bibasilar scarring.   PAST MEDICAL HISTORY :   has a past medical history of Arthritis, B12 deficiency, Basal cell cancer, Carotid artery occlusion, Cataract, Chronic diastolic CHF (congestive heart failure) ([place]), Colon polyps, COPD (chronic obstructive pulmonary disease) ([place]), Coronary artery disease, Dyspnea, First degree atrioventricular block, History of obesity, Hyperlipidemia, Hypertension,  Hyperthyroidism, Lichen sclerosus et atrophicus of the vulva, Osteopenia, PAD (peripheral artery disease) ([place]), and Pulmonary fibrosis ([place]).  has a past surgical history that includes Endarterectomy; Cholecystectomy (1999); Repair tendons foot; Meniscus repair (Left, 10/29/2012); Carotid endarterectomy (Right, 2002);  Knee arthroscopy (Left); Cataract extraction w/ intraocular lens  implant, bilateral; and Open reduction internal fixation (orif) distal radial fracture (Left, 11/08/2016). Prior to Admission medications   Medication Sig Start Date End Date Taking? Authorizing Provider  acetaminophen (TYLENOL) 500 MG tablet Take 1,000 mg by mouth at bedtime.    Yes [provider]  Aloe-Sodium Chloride (AYR SALINE NASAL GEL NA) Place 1 application into the nose daily as needed (dryness).   Yes [provider]  Celedonio Miyamoto 62.5-25 MCG/INH AEPB INHALE 1 PUFF INTO THE LUNGS DAILY 04/14/18  Yes Rigoberto Noel, MD  aspirin 81 MG tablet Take 81 mg by mouth 2 (two) times daily.    Yes [provider]  Carboxymethylcellul-Glycerin (LUBRICATING EYE DROPS OP) Apply 1 drop to eye daily as needed (dry eyes).   Yes [provider]  denosumab (PROLIA) 60 MG/ML SOLN injection Inject 60 mg into the skin every 6 (six) months. Administer in upper arm, thigh, or abdomen   Yes [provider]  furosemide (LASIX) 20 MG tablet Take 1 tablet (20 mg total) by mouth daily. 02/24/18  Yes [place], Peter M, MD  isosorbide mononitrate (IMDUR) 30 MG 24 hr tablet TAKE 1 TABLET(30 MG) BY MOUTH DAILY 11/21/17  Yes [place], Peter M, MD  levothyroxine (SYNTHROID, LEVOTHROID) 137 MCG tablet Take 137 mcg by mouth daily.     Yes [provider]  metoprolol tartrate (LOPRESSOR) 50 MG tablet Take 1 tablet (50 mg total) by mouth 2 (two) times daily. 01/15/18  Yes [place], Peter M, MD  multivitamin-lutein [facility]) CAPS capsule Take 1 capsule by mouth daily.   Yes [provider]  nitroGLYCERIN (NITROSTAT) 0.4 MG SL tablet Place 1 tablet (0.4 mg total) under the tongue every 5 (five) minutes as needed for chest pain (MAX 3 doses). 06/01/18  Yes [place], Peter M, MD  OXYGEN Inhale 8 L into the lungs daily.    Yes [provider]  Probiotic Product (PROBIOTIC DAILY PO) Take 1 tablet by mouth  daily.    Yes [provider]  rosuvastatin (CRESTOR) 40 MG tablet Take 20 mg by mouth at bedtime.    Yes [provider]   Allergies  Allergen Reactions  . Tape Other (See Comments)    Pulls skin    FAMILY HISTORY:  family history includes Breast cancer in her maternal aunt; Diabetes in her sister; Heart attack (age of onset: 70) in her father; Heart disease in her brother; Hypertension in her brother and father. SOCIAL HISTORY:  reports that she quit smoking about 24 years ago. Her smoking use included cigarettes. She has a 40.00 pack-year smoking history. She has never used smokeless tobacco. She reports that she does not drink alcohol or use drugs.  REVIEW OF SYSTEMS:   Constitutional: Negative for fever, chills, weight loss, malaise/fatigue and diaphoresis.  HENT: Negative for hearing loss, ear pain, nosebleeds, congestion, sore throat, neck pain, tinnitus and ear discharge.   Eyes: Negative for blurred vision, double vision, photophobia, pain, discharge and redness.  Respiratory: Negative for cough, hemoptysis, sputum production, shortness of breath, wheezing and stridor.   Cardiovascular: Negative for chest pain, palpitations, orthopnea, claudication, leg swelling and PND.  Gastrointestinal: Negative for heartburn, nausea, vomiting, abdominal pain, diarrhea, constipation, blood  in stool and melena.  Genitourinary: Negative for dysuria, urgency, frequency, hematuria and flank pain.  Musculoskeletal: Negative for myalgias, back pain, joint pain and falls.  Skin: Negative for itching and rash.  Neurological: Negative for dizziness, tingling, tremors, sensory change, speech change, focal weakness, seizures, loss of consciousness, weakness and headaches.  Endo/Heme/Allergies: Negative for environmental allergies and polydipsia. Does not bruise/bleed easily.  SUBJECTIVE:  She is feeling better, currently on 8 L/min VITAL SIGNS: Temp:  [97.5 F (36.4 C)-98.4 F (36.9  C)] 97.8 F (36.6 C) (07/20 0540) Pulse Rate:  [63-85] 63 (07/20 0540) Resp:  [18] 18 (07/20 0540) BP: (111-171)/(60-71) 111/60 (07/20 0540) SpO2:  [89 %-99 %] 95 % (07/20 0925) Weight:  [66.8 kg (147 lb 4.8 oz)] 66.8 kg (147 lb 4.8 oz) (07/20 0540)  PHYSICAL EXAMINATION: General: Pleasant woman sitting up in a chair, no distress on high flow nasal cannula Neuro: Awake, alert, interacting, follows commands HEENT: She has bruising and raccoon eyes, oropharynx clear Cardiovascular: Regular, 2 out of 6 murmur Lungs: Mostly clear bilaterally, no significant crackles Abdomen: Soft, benign Musculoskeletal: No deformity Skin: No rash  Recent Labs  Lab 06/05/18 0053 06/06/18 0511  NA 142 143  K 3.1* 4.2  CL 106 109  CO2 23 28  BUN 19 19  CREATININE 1.02* 1.50*  GLUCOSE 123* 106*   Recent Labs  Lab 06/05/18 0053 06/06/18 0511  HGB 13.2 12.5  HCT 42.6 40.4  WBC 8.4 7.5  PLT 170 160   Dg Chest 2 View  Result Date: 06/05/2018 CLINICAL DATA:  Dyspnea and syncope EXAM: CHEST - 2 VIEW COMPARISON:  05/15/2018 FINDINGS: Stable cardiomegaly with aortic atherosclerosis. Stable mild interstitial prominence compatible with known fibrosis. Probable scarring at the left lung base. No overt pulmonary edema, pulmonary consolidation, effusion or pneumothorax. IMPRESSION: Stable cardiomegaly with aortic atherosclerosis. Chronic interstitial prominence compatible with known fibrosis. Probable scarring at the left lung base. No active pulmonary disease. Electronically Signed   By: Ashley Royalty M.D.   On: 06/05/2018 01:20   Ct Head Wo Contrast  Result Date: 06/05/2018 CLINICAL DATA:  84 year old female with fall and trauma to the head. EXAM: CT HEAD WITHOUT CONTRAST TECHNIQUE: Contiguous axial images were obtained from the base of the skull through the vertex without intravenous contrast. COMPARISON:  Head CT dated 05/31/2008 FINDINGS: Brain: There is mild age-related atrophy and chronic microvascular  ischemic changes. There is no acute intracranial hemorrhage. No mass effect midline shift no extra-axial fluid collection. Vascular: No hyperdense vessel or unexpected calcification. Skull: Normal. Negative for fracture or focal lesion. Sinuses/Orbits: Minimal mucoperiosteal thickening of paranasal sinuses. No air-fluid levels. Minimal bilateral mastoid effusions. Other: Right forehead hematoma. IMPRESSION: 1. No acute intracranial pathology. 2. Mild age-related atrophy and chronic microvascular ischemic changes. Electronically Signed   By: Anner Crete M.D.   On: 06/05/2018 01:42    ASSESSMENT / PLAN:  Chronic hypoxemic respiratory failure.  Multifactorial in the setting of COPD, diastolic CHF, right to left shunt, secondary pulmonary arterial hypertension.  I do not hear any crackles on exam or any evidence for interstitial lung disease.    Syncope.  I suspect that this was primarily due to her inappropriately turning down her oxygen.  It has been documented that she needs to wear about 8 L/min at rest, increase from 10 to 15 L/min depending on her exertion (she uses 15 L/min when at pulmonary rehab).  Also consider the contribution of her urinary tract infection, which is being treated.  Recs:  I explained to her and her daughter that she needs to wear at least 8 L/min at rest, okay to increase with exertion.  There is no indication for her to decrease to 5 L/min either when sleeping or at any time.  I also recommended that she use her pulse oximeter as a guide and that we want her saturations to stay in the high 80s if possible.  I do not see any indication to perform a CT scan of her chest at this time, no ILD on her previous scan 2 years ago and her exam does not have crackles.  She did not really improve clinically when she was on tadalafil, her echocardiogram is equivocal for [facility] (although it was present in 2017, the only study where a good TR jet was appreciated).  We will with Dr. Elsworth Soho as  planned   Baltazar Apo, MD, PhD 06/06/2018, 4:04 PM [facility] [phone] or if no answer [phone]

## 2018-06-06 NOTE — Plan of Care (Signed)
  Problem: Pain Managment: Goal: General experience of comfort will improve Outcome: Progressing   Problem: Skin Integrity: Goal: Risk for impaired skin integrity will decrease Outcome: Progressing   

## 2018-06-06 NOTE — Progress Notes (Signed)
Patient O2 SAT on 8L at rest was 93%.  Patient desaturated to 82% on 8L while ambulating.  Patient walked about 150ft. Marcille Blanco, RN

## 2018-06-06 NOTE — Progress Notes (Addendum)
PROGRESS NOTE    JONIYA BOBERG  DUK:[id number] DOB: [birth year] DOA: 06/05/2018 PCP: [name], MD      Brief Narrative:  Mrs. [name] is a 84 y.o. F with COPD FEV1 67%, pHTN, dCHF and chronic hypoxic respiratory failure who presents with syncopal episode.  Patient was at home watching television, got up, walk to the back of her house to go to bed, turn her oxygen tank down, then she continued walking her vision "grayed out", and she passed out onto the floor.  She has very high oxygen requirements with exertion, had similar passing out when her oxygen is been lower than usual recently.      Assessment & Plan:  Syncope Likely this was hypoxia mediated.    Acute hypoxic respiratory failure Patient presents with recurrent hypoxia and increased exertional intolerance/dyspnea from baseline. -I am doubtful that CHF is contributing, despite her elevated BNP, because she has no symptoms of orthopnea, leg swelling, weight gain (she weighs daily, is reliable).   -UTI would not cause hypoxia.   -No symptoms or CXR findings to suggest pneumonia -No wheezing to suggest COPD flare  Prior to admission, her dyspnea on exertion was only noticed in the last few days before admission, at [facility] rehab, but that was again in the context of her oxygen level being accidentally set too low.    Severe range pressures were probably not helping her cardiopulmonary dynamics, but those are better today, just with diuresis.  -Consult Pulmonology, appreciate cares -Discussed CT chest with Pulm by phone earlier, but IV contrast is deferred for now in setting of her Cr bump this AM -Consult Cardiology, appreciate cares   AKI Mild acute kidney injury.  Baseline Cr 1.0, today 1.5 mg/dL in setting of diuresis. -Check urine electrolytes -Hold Lasix  Chronic diastolic CHF Recent Echo showed diastolic dysfunction, RV dysfunction, normal LV function, no significant valve disease.   -Hold home Lasix til  Sunday  COPD on home O2 Pulmonary hypertension FEV1 only 67%, DLCO very low, disproportionate to other lung disease.  Last CT chest was in 2017, showed no interstitial lung disease. -Low threshold to repeat CT chest -Consult Pulmonology -Continue Anoro -Continue albuterol PRN  Hypertension Coronary disease secondary prevention -Continue aspirin, statin -Continue Imdur -Continue losartan -Continue metoprolol  Hypothyroidism -Continue levothyroxine   Hypokalemia Mild.  Resolved.  Acute cystitis Dysuria with GNRs in urine. -Stop CTX -Change to cephalexin 250 BID for 3 days -Follow urine culture           DVT prophylaxis: Lovenox Code Status: DO NOT RESUSCITATE Family Communication: Son at bedside MDM and disposition Plan: The below labs and imagineg were reviewed and summarized above.    The patient was admitted with syncope, likely from hypoxia.  Unclear what precipitated hypoxia.    Yesterday with ambulation, she waleked 20 feet, and when she stopped, her O2 dropped to 75% even on her home 10L O2 rate.  Will consult Pulmonology today.  Is there a reversible cause of this hypoxia with exertion or is this irreversible progression of her lung disease?     Objective: Vitals:   06/06/18 0255 06/06/18 0539 06/06/18 0540 06/06/18 0655  BP:   111/60   Pulse:   63   Resp:   18   Temp:   97.8 F (36.6 C)   TempSrc:   Oral   SpO2: 90% 99%  96%  Weight:   66.8 kg (147 lb 4.8 oz)   Height:  Intake/Output Summary (Last 24 hours) at 06/06/2018 0907 Last data filed at 06/05/2018 2245 Gross per 24 hour  Intake 420 ml  Output 776 ml  Net -356 ml   Filed Weights   06/05/18 0626 06/06/18 0540  Weight: 68.1 kg (150 lb 3.2 oz) 66.8 kg (147 lb 4.8 oz)    Examination: BP 111/60 (BP Location: Left Arm)   Pulse 63   Temp 97.8 F (36.6 C) (Oral)   Resp 18   Ht 5\' 3"  (1.6 m)   Wt 66.8 kg (147 lb 4.8 oz)   SpO2 96%   BMI 26.09 kg/m   General: Eldelry ,  alert, sitting on edge of bed, able to ambluate unsteadily to chair.  Eye contact normal.   HEENT: Large bruise over right eye, black eye today.Corneas clear, conjunctivae and sclerae normal without injection or icterus.  Visual tracking smooth.  Hearing normal. Cardiac: RRR, nl S1-S2, no murmurs, rubs, gallops.  No LE edema. Respiratory: Normal respiratory rate and rhythm.  CTAB without rales or wheezes.  Mildly diminishe din bases.   Extremities: No deformities/injuries.  5/5 grip strength and upper extremity flexion/extension, symmetrically.  Extremities are warm and well-perfused. Neuro: Sensorium intact.  Speech is fluent.  Naming is grossly intact, and the patient's recall, recent and remote, as well as general fund of knowledge seem within normal limits.  Muscle tone normal, without fasciculations.  Moves all extremities equally and with normal coordination.  Attention span and concentration are within normal limits.  Psych: Full range of affect.  Normal rate and rhythm of speech.  Thought content appropriate, and thought process linear.  No SI/HI, aural or visual hallucinations or delusions. Attention and concentration are normal.      Data Reviewed: I have personally reviewed following labs and imaging studies:  CBC: Recent Labs  Lab 06/05/18 0053 06/06/18 0511  WBC 8.4 7.5  NEUTROABS 6.7  --   HGB 13.2 12.5  HCT 42.6 40.4  MCV 80.1 81.1  PLT 170 408   Basic Metabolic Panel: Recent Labs  Lab 06/05/18 0053 06/06/18 0511  NA 142 143  K 3.1* 4.2  CL 106 109  CO2 23 28  GLUCOSE 123* 106*  BUN 19 19  CREATININE 1.02* 1.50*  CALCIUM 8.8* 9.0   GFR: Estimated Creatinine Clearance: 24.7 mL/min (A) (by C-G formula based on SCr of 1.5 mg/dL (H)). Liver Function Tests: No results for input(s): AST, ALT, ALKPHOS, BILITOT, PROT, ALBUMIN in the last 168 hours. No results for input(s): LIPASE, AMYLASE in the last 168 hours. No results for input(s): AMMONIA in the last 168  hours. Coagulation Profile: No results for input(s): INR, PROTIME in the last 168 hours. Cardiac Enzymes: Recent Labs  Lab 06/05/18 0523 06/05/18 1116 06/05/18 1632  TROPONINI <0.03 <0.03 <0.03   BNP (last 3 results) No results for input(s): PROBNP in the last 8760 hours. HbA1C: No results for input(s): HGBA1C in the last 72 hours. CBG: No results for input(s): GLUCAP in the last 168 hours. Lipid Profile: No results for input(s): CHOL, HDL, LDLCALC, TRIG, CHOLHDL, LDLDIRECT in the last 72 hours. Thyroid Function Tests: Recent Labs    06/05/18 0523  TSH 1.006   Anemia Panel: No results for input(s): VITAMINB12, FOLATE, FERRITIN, TIBC, IRON, RETICCTPCT in the last 72 hours. Urine analysis:    Component Value Date/Time   COLORURINE YELLOW 06/05/2018 0132   APPEARANCEUR HAZY (A) 06/05/2018 0132   LABSPEC 1.018 06/05/2018 0132   PHURINE 5.0 06/05/2018 0132  GLUCOSEU NEGATIVE 06/05/2018 0132   HGBUR NEGATIVE 06/05/2018 0132   BILIRUBINUR NEGATIVE 06/05/2018 0132   KETONESUR NEGATIVE 06/05/2018 0132   PROTEINUR 100 (A) 06/05/2018 0132   UROBILINOGEN 0.2 05/20/2013 0934   NITRITE POSITIVE (A) 06/05/2018 0132   LEUKOCYTESUR LARGE (A) 06/05/2018 0132   Sepsis Labs: @LABRCNTIP (procalcitonin:4,lacticacidven:4)  ) Recent Results (from the past 240 hour(s))  Urine Culture     Status: Abnormal (Preliminary result)   Collection Time: 06/05/18  1:32 AM  Result Value Ref Range Status   Specimen Description URINE, RANDOM  Final   Special Requests   Final    NONE Performed at [facility], [place] [address], [place], [place] [address]    Culture >=100,000 COLONIES/mL GRAM NEGATIVE RODS (A)  Final   Report Status PENDING  Incomplete         Radiology Studies: Dg Chest 2 View  Result Date: 06/05/2018 CLINICAL DATA:  Dyspnea and syncope EXAM: CHEST - 2 VIEW COMPARISON:  05/15/2018 FINDINGS: Stable cardiomegaly with aortic atherosclerosis. Stable mild interstitial  prominence compatible with known fibrosis. Probable scarring at the left lung base. No overt pulmonary edema, pulmonary consolidation, effusion or pneumothorax. IMPRESSION: Stable cardiomegaly with aortic atherosclerosis. Chronic interstitial prominence compatible with known fibrosis. Probable scarring at the left lung base. No active pulmonary disease. Electronically Signed   By: Ashley Royalty M.D.   On: 06/05/2018 01:20   Ct Head Wo Contrast  Result Date: 06/05/2018 CLINICAL DATA:  84 year old female with fall and trauma to the head. EXAM: CT HEAD WITHOUT CONTRAST TECHNIQUE: Contiguous axial images were obtained from the base of the skull through the vertex without intravenous contrast. COMPARISON:  Head CT dated 05/31/2008 FINDINGS: Brain: There is mild age-related atrophy and chronic microvascular ischemic changes. There is no acute intracranial hemorrhage. No mass effect midline shift no extra-axial fluid collection. Vascular: No hyperdense vessel or unexpected calcification. Skull: Normal. Negative for fracture or focal lesion. Sinuses/Orbits: Minimal mucoperiosteal thickening of paranasal sinuses. No air-fluid levels. Minimal bilateral mastoid effusions. Other: Right forehead hematoma. IMPRESSION: 1. No acute intracranial pathology. 2. Mild age-related atrophy and chronic microvascular ischemic changes. Electronically Signed   By: Anner Crete M.D.   On: 06/05/2018 01:42        Scheduled Meds: . aspirin EC  81 mg Oral Daily  . enoxaparin (LOVENOX) injection  40 mg Subcutaneous Daily  . [START ON 06/08/2018] furosemide  20 mg Oral Daily  . isosorbide mononitrate  30 mg Oral Daily  . levothyroxine  137 mcg Oral QAC breakfast  . losartan  25 mg Oral Daily  . metoprolol tartrate  50 mg Oral BID  . rosuvastatin  20 mg Oral QHS  . sodium chloride flush  3 mL Intravenous Q12H  . Tdap  0.5 mL Intramuscular Once  . umeclidinium-vilanterol  1 puff Inhalation Daily   Continuous Infusions: .  cefTRIAXone (ROCEPHIN)  IV       LOS: 0 days    Time spent: 25 minutes    Edwin Dada, MD [facility] 06/06/2018, 9:07 AM     Pager [phone] --- please page though AMION:  [email or web address] Password [email or web address] If 7PM-7AM, please contact night-coverage

## 2018-06-06 NOTE — Progress Notes (Signed)
Progress Note  Patient Name: [name] Date of Encounter: 06/06/2018  Primary Cardiologist: Peter Martinique, MD   Subjective  No more syncope. No new concerns this AM.  Inpatient Medications    Scheduled Meds: . aspirin EC  81 mg Oral Daily  . cephALEXin  250 mg Oral Q12H  . enoxaparin (LOVENOX) injection  40 mg Subcutaneous Daily  . [START ON 06/08/2018] furosemide  20 mg Oral Daily  . isosorbide mononitrate  30 mg Oral Daily  . levothyroxine  137 mcg Oral QAC breakfast  . losartan  25 mg Oral Daily  . metoprolol tartrate  50 mg Oral BID  . rosuvastatin  20 mg Oral QHS  . sodium chloride flush  3 mL Intravenous Q12H  . Tdap  0.5 mL Intramuscular Once  . umeclidinium-vilanterol  1 puff Inhalation Daily   Continuous Infusions:  PRN Meds: acetaminophen **OR** acetaminophen, hydroxypropyl methylcellulose / hypromellose, ipratropium-albuterol, nitroGLYCERIN, ondansetron **OR** ondansetron (ZOFRAN) IV   Vital Signs    Vitals:   06/06/18 0539 06/06/18 0540 06/06/18 0655 06/06/18 0925  BP:  111/60    Pulse:  63    Resp:  18    Temp:  97.8 F (36.6 C)    TempSrc:  Oral    SpO2: 99%  96% 95%  Weight:  147 lb 4.8 oz (66.8 kg)    Height:        Intake/Output Summary (Last 24 hours) at 06/06/2018 1013 Last data filed at 06/05/2018 2245 Gross per 24 hour  Intake 270 ml  Output 776 ml  Net -506 ml   Filed Weights   06/05/18 0626 06/06/18 0540  Weight: 150 lb 3.2 oz (68.1 kg) 147 lb 4.8 oz (66.8 kg)    Telemetry    Normal sinus rhythm - Personally Reviewed  ECG    06/05/18 normal sinus rhythm - Personally Reviewed  Physical Exam   GEN: No acute distress.  Large area of ecchymoses across face Neck: supple, no JVD Cardiac: regular S1 and S2, no murmurs, rubs, or gallops.  Respiratory: Clear to auscultation bilaterally. GI: Soft, nontender, non-distended. Bowel sounds normal MS: trace bilateral ankle edema; No deformity. Neuro:  Nonfocal, moves all limbs  independently Psych: Normal affect   Labs    Chemistry Recent Labs  Lab 06/05/18 0053 06/06/18 0511  NA 142 143  K 3.1* 4.2  CL 106 109  CO2 23 28  GLUCOSE 123* 106*  BUN 19 19  CREATININE 1.02* 1.50*  CALCIUM 8.8* 9.0  GFRNONAA 48* 30*  GFRAA 56* 35*  ANIONGAP 13 6     Hematology Recent Labs  Lab 06/05/18 0053 06/06/18 0511  WBC 8.4 7.5  RBC 5.32* 4.98  HGB 13.2 12.5  HCT 42.6 40.4  MCV 80.1 81.1  MCH 24.8* 25.1*  MCHC 31.0 30.9  RDW 14.4 14.8  PLT 170 160    Cardiac Enzymes Recent Labs  Lab 06/05/18 0523 06/05/18 1116 06/05/18 1632  TROPONINI <0.03 <0.03 <0.03    Recent Labs  Lab 06/05/18 0054  TROPIPOC 0.04     BNP Recent Labs  Lab 06/05/18 0053  BNP 845.8*     DDimer No results for input(s): DDIMER in the last 168 hours.   Radiology    Dg Chest 2 View  Result Date: 06/05/2018 CLINICAL DATA:  Dyspnea and syncope EXAM: CHEST - 2 VIEW COMPARISON:  05/15/2018 FINDINGS: Stable cardiomegaly with aortic atherosclerosis. Stable mild interstitial prominence compatible with known fibrosis. Probable scarring at the left lung  base. No overt pulmonary edema, pulmonary consolidation, effusion or pneumothorax. IMPRESSION: Stable cardiomegaly with aortic atherosclerosis. Chronic interstitial prominence compatible with known fibrosis. Probable scarring at the left lung base. No active pulmonary disease. Electronically Signed   By: Ashley Royalty M.D.   On: 06/05/2018 01:20   Ct Head Wo Contrast  Result Date: 06/05/2018 CLINICAL DATA:  84 year old female with fall and trauma to the head. EXAM: CT HEAD WITHOUT CONTRAST TECHNIQUE: Contiguous axial images were obtained from the base of the skull through the vertex without intravenous contrast. COMPARISON:  Head CT dated 05/31/2008 FINDINGS: Brain: There is mild age-related atrophy and chronic microvascular ischemic changes. There is no acute intracranial hemorrhage. No mass effect midline shift no extra-axial fluid  collection. Vascular: No hyperdense vessel or unexpected calcification. Skull: Normal. Negative for fracture or focal lesion. Sinuses/Orbits: Minimal mucoperiosteal thickening of paranasal sinuses. No air-fluid levels. Minimal bilateral mastoid effusions. Other: Right forehead hematoma. IMPRESSION: 1. No acute intracranial pathology. 2. Mild age-related atrophy and chronic microvascular ischemic changes. Electronically Signed   By: Anner Crete M.D.   On: 06/05/2018 01:42    Cardiac Studies   Echo pending today.  Patient Profile     84 y.o. female with a hx of CAD s/p remote angioplasty of RCA 2060, diastolic CHF, pulmonary hypertension, COPD on home oxygen, PAD, carotid artery disease s/p right CEA, chronic left subclavian occlusion, hyperthyroidism, hypertension, hyperlipidemia who is being followed for syncope and heart failure  Assessment & Plan    Syncope: no additional episodes here. History sounds as though the episodes are closely linked to her O2 levels. No significant arrhythmia seen on telemetry.  Acute on chronic diastolic heart failure: appears euvolemic. Do not think that her HFpEF is contributing. There is an echo ordered to assess her diastolic function and PA pressures if we can.   Hypertension, hyperlipidemia, history of CAD: not active issues at this time.   Time Spent Directly with Patient: I have spent a total of 25 minutes with the patient reviewing hospital notes, telemetry, EKGs, labs and examining the patient as well as establishing an assessment and plan that was discussed personally with the patient.  > 50% of time was spent in direct patient care.  Length of Stay:  LOS: 0 days   Buford Dresser, MD, PhD [facility]   06/06/2018, 10:13 AM      For questions or updates, please contact [place] Please consult [email or web address] for contact info under Cardiology/STEMI.

## 2018-06-06 NOTE — Care Management Obs Status (Signed)
[facility] NOTIFICATION   Patient Details  Name: FOSTER SONNIER MRN: [id number] Date of Birth: 1929/10/09   Medicare Observation Status Notification Given:  Yes    Bethena Roys, RN 06/06/2018, 1:39 PM

## 2018-06-07 DIAGNOSIS — R55 Syncope and collapse: Secondary | ICD-10-CM | POA: Diagnosis not present

## 2018-06-07 DIAGNOSIS — I5032 Chronic diastolic (congestive) heart failure: Secondary | ICD-10-CM | POA: Diagnosis not present

## 2018-06-07 DIAGNOSIS — J9611 Chronic respiratory failure with hypoxia: Secondary | ICD-10-CM | POA: Diagnosis not present

## 2018-06-07 DIAGNOSIS — I10 Essential (primary) hypertension: Secondary | ICD-10-CM | POA: Diagnosis not present

## 2018-06-07 DIAGNOSIS — N390 Urinary tract infection, site not specified: Secondary | ICD-10-CM | POA: Diagnosis not present

## 2018-06-07 DIAGNOSIS — J449 Chronic obstructive pulmonary disease, unspecified: Secondary | ICD-10-CM | POA: Diagnosis not present

## 2018-06-07 LAB — BASIC METABOLIC PANEL
Anion gap: 7 (ref 5–15)
BUN: 21 mg/dL (ref 8–23)
CO2: 26 mmol/L (ref 22–32)
CREATININE: 1.06 mg/dL — AB (ref 0.44–1.00)
Calcium: 8.9 mg/dL (ref 8.9–10.3)
Chloride: 107 mmol/L (ref 98–111)
GFR, EST AFRICAN AMERICAN: 54 mL/min — AB (ref >60)
GFR, EST NON AFRICAN AMERICAN: 46 mL/min — AB (ref >60)
Glucose, Bld: 105 mg/dL — ABNORMAL HIGH (ref 70–99)
Potassium: 3.6 mmol/L (ref 3.5–5.1)
SODIUM: 140 mmol/L (ref 135–145)

## 2018-06-07 LAB — URINE CULTURE: Culture: >=100000 — AB

## 2018-06-07 MED ORDER — CEPHALEXIN 500 MG PO CAPS: 500.0000 mg | ORAL_CAPSULE | Freq: Two times a day (BID) | ORAL | 0 refills | Status: AC

## 2018-06-07 NOTE — Progress Notes (Signed)
Pt on home O2 at 8L at rest and 15L when she is up. Dr. Loleta Books reports that her home O2 tank concentrator only goes up to 10L. Contacted Brad at [facility] and he reports that they don't handle anything above 10L.Marland Kitchen   Met with pt and family. Explained to them that [employer] O2 tank concentrator only goes up to 10L. Her daughter reports that they need to find another company for the home O2. Will call [employer] and they agreed.  Contacted Ashley at [facility] [phone]) and he reports that they can provide a wide connector and a two oxyge tank roller and she can set 1 O2 tank at 10L and the other one at 5L. He reports that they need to check on pt's insurance on Monday to see if she can switch companies and usually is not hard to switch. He reports that he will f/u and to provide them with their number [phone] and to ask for [facility]. Met with pt and family again and they agreed to f/u with [employer] on Monday. Provided them [employer]'s number. They stated that she has enough  O2 tanks at home.

## 2018-06-07 NOTE — Discharge Summary (Signed)
Physician Discharge Summary  [name] CHE:[id number] DOB: 06/06/1929 DOA: 06/05/2018  PCP: [name], MD  Admit date: 06/05/2018 Discharge date: 06/07/2018  Admitted From: Home  Disposition:  Home  Recommendations for Outpatient Follow-up:  1. Follow up with PCP in 1 week   Home Health: None  Equipment/Devices: Home O2  Discharge Condition: Fair  CODE STATUS: DO NOT RESUSCITATE Diet recommendation: Cardiac  Brief/Interim Summary: [name] is a 84 y.o. F with COPD FEV1 67%, pHTN, dCHF and chronic hypoxic respiratory failure who presents with syncopal episode.  Patient was at home watching television, got up, walk to the back of her house to go to bed, turn her oxygen tank down, then she continued walking her vision "grayed out", and she passed out onto the floor.  She has very high oxygen requirements with exertion, had similar passing out when her oxygen is been lower than usual recently.       Discharge Diagnoses:   Syncope Likely this was hypoxia mediated.    Acute hypoxic respiratory failure Patient presents with recurrent hypoxia and increased exertional intolerance/dyspnea from baseline. -I am doubtful that CHF is contributing, despite her elevated BNP, because she has no symptoms of orthopnea, leg swelling, weight gain (she weighs daily, is reliable), and Dr. Serafina Topham/Cardiology agrees.   -UTI would not cause hypoxia.   -No symptoms or CXR findings to suggest pneumonia -No wheezing to suggest COPD flare  Prior to admission, her dyspnea on exertion was only noticed in the last few days before admission, at [facility] rehab, but that was again in the context of her oxygen level being accidentally set too low.    Severe range pressures were probably not helping her cardiopulmonary dynamics, but those are better today, just with diuresis. -Evaluated by Pulmonology, who agree, based on exam, no findings to suggest active pulmonary disease; it appears  ultimately that she was using lower O2 settings than she had been recommended (was supposed to be on 8L at rest, 15L with exertion, but instead was doing 5L at rest and when sleeping, 8L with exertion, FURTHER she had only a concentrator and O2 tanks at home that would go up to 10 L/min max).  -Case mgmt were consulted, patient was provided with O2 tanks that could provide 15 l/min O2    AKI Cr bumped to 1.5 with diuresis, Lasix was held and this resolved.    Chronic diastolic CHF Repeated Echo showed diastolic dysfunction, RV dysfunction, normal LV function, no significant valve disease.  Evluated by Cardiology, felt that her echo was unchanged from previous, she had had no dysrhythmia on tele, unlikely her syncope was cardiogenic.  COPD on home O2 Pulmonary hypertension FEV1 only 67%, DLCO very low, disproportionate to other lung disease.  Last CT chest was in 2017, showed no interstitial lung disease.  Hypertension Coronary disease secondary prevention No change to regimen.  Hypothyroidism No change to regimen   Hypokalemia Mild.  Resolved.  Acute cystitis Dysuria with GNRs in urine.  Treated with cephalexin at discharge.          Discharge Instructions  Discharge Instructions    Diet - low sodium heart healthy   Complete by:  As directed    Discharge instructions   Complete by:  As directed    From Dr. Loleta Books: You were admitted with passing out (a "syncopal episode"). Dr. Lamonte Sakai and Drs. Lajuana Carry and I feel this was likely from low oxygen levels.  Maintain your O2 level between 88-94%.  Specifically, this should be done with 8L of oxygen at rest, and 15L of oxygen with ambulation.  Continue pulmonary rehab.  Call Dr. Bari Mantis office this week for a follow up appointment.   For the bladder infection:  Take Cephalexin 500 mg twice daily for 3 more days, then stop.   Increase activity slowly   Complete by:  As directed      Allergies as  of 06/07/2018      Reactions   Tape Other (See Comments)   Pulls skin      Medication List    TAKE these medications   acetaminophen 500 MG tablet Commonly known as:  TYLENOL Take 1,000 mg by mouth at bedtime.   ANORO ELLIPTA 62.5-25 MCG/INH Aepb Generic drug:  umeclidinium-vilanterol INHALE 1 PUFF INTO THE LUNGS DAILY   aspirin 81 MG tablet Take 81 mg by mouth 2 (two) times daily.   AYR SALINE NASAL GEL NA Place 1 application into the nose daily as needed (dryness).   cephALEXin 500 MG capsule Commonly known as:  KEFLEX Take 1 capsule (500 mg total) by mouth 2 (two) times daily for 3 days.   denosumab 60 MG/ML Soln injection Commonly known as:  PROLIA Inject 60 mg into the skin every 6 (six) months. Administer in upper arm, thigh, or abdomen   furosemide 20 MG tablet Commonly known as:  LASIX Take 1 tablet (20 mg total) by mouth daily.   isosorbide mononitrate 30 MG 24 hr tablet Commonly known as:  IMDUR TAKE 1 TABLET(30 MG) BY MOUTH DAILY   levothyroxine 137 MCG tablet Commonly known as:  SYNTHROID, LEVOTHROID Take 137 mcg by mouth daily.   LUBRICATING EYE DROPS OP Apply 1 drop to eye daily as needed (dry eyes).   metoprolol tartrate 50 MG tablet Commonly known as:  LOPRESSOR Take 1 tablet (50 mg total) by mouth 2 (two) times daily.   multivitamin-lutein Caps capsule Take 1 capsule by mouth daily.   nitroGLYCERIN 0.4 MG SL tablet Commonly known as:  NITROSTAT Place 1 tablet (0.4 mg total) under the tongue every 5 (five) minutes as needed for chest pain (MAX 3 doses).   OXYGEN Inhale 8 L into the lungs daily.   PROBIOTIC DAILY PO Take 1 tablet by mouth daily.   rosuvastatin 40 MG tablet Commonly known as:  CRESTOR Take 20 mg by mouth at bedtime.       Allergies  Allergen Reactions  . Tape Other (See Comments)    Pulls skin    Consultations:  Pulmonology  Cardiology   Procedures/Studies: Dg Chest 2 View  Result Date:  06/05/2018 CLINICAL DATA:  Dyspnea and syncope EXAM: CHEST - 2 VIEW COMPARISON:  05/15/2018 FINDINGS: Stable cardiomegaly with aortic atherosclerosis. Stable mild interstitial prominence compatible with known fibrosis. Probable scarring at the left lung base. No overt pulmonary edema, pulmonary consolidation, effusion or pneumothorax. IMPRESSION: Stable cardiomegaly with aortic atherosclerosis. Chronic interstitial prominence compatible with known fibrosis. Probable scarring at the left lung base. No active pulmonary disease. Electronically Signed   By: Ashley Royalty M.D.   On: 06/05/2018 01:20   Dg Chest 2 View  Result Date: 05/15/2018 CLINICAL DATA:  Shortness of breath.  Pulmonary fibrosis. EXAM: CHEST - 2 VIEW COMPARISON:  January 27, 2018 FINDINGS: Mild reticular changes in the bases consistent with history of fibrosis. No pneumothorax. The heart size borderline. The hila and mediastinum are normal. No acute abnormalities. IMPRESSION: 1. Known pulmonary fibrosis.  No other acute abnormalities. Electronically Signed  By: Dorise Bullion III M.D   On: 05/15/2018 15:35   Ct Head Wo Contrast  Result Date: 06/05/2018 CLINICAL DATA:  84 year old female with fall and trauma to the head. EXAM: CT HEAD WITHOUT CONTRAST TECHNIQUE: Contiguous axial images were obtained from the base of the skull through the vertex without intravenous contrast. COMPARISON:  Head CT dated 05/31/2008 FINDINGS: Brain: There is mild age-related atrophy and chronic microvascular ischemic changes. There is no acute intracranial hemorrhage. No mass effect midline shift no extra-axial fluid collection. Vascular: No hyperdense vessel or unexpected calcification. Skull: Normal. Negative for fracture or focal lesion. Sinuses/Orbits: Minimal mucoperiosteal thickening of paranasal sinuses. No air-fluid levels. Minimal bilateral mastoid effusions. Other: Right forehead hematoma. IMPRESSION: 1. No acute intracranial pathology. 2. Mild age-related  atrophy and chronic microvascular ischemic changes. Electronically Signed   By: Anner Crete M.D.   On: 06/05/2018 01:42   Nm Pulmonary Perf And Vent  Result Date: 05/15/2018 CLINICAL DATA:  Pulmonary hypertension. EXAM: NUCLEAR MEDICINE VENTILATION - PERFUSION LUNG SCAN TECHNIQUE: Ventilation images were obtained in multiple projections using inhaled aerosol Tc-60m DTPA. Perfusion images were obtained in multiple projections after intravenous injection of Tc-63m-MAA. RADIOPHARMACEUTICALS:  31.7 mCi of Tc-32m DTPA aerosol inhalation and 4.2 mCi Tc32m-MAA IV COMPARISON:  May 15, 2018 FINDINGS: Ventilation: No unmatched defects. Minimally heterogeneous ventilation. Perfusion: No unmatched defects. Minimally heterogeneous ventilation. IMPRESSION: No evidence of acute or chronic pulmonary emboli on today's study. Very low probability V/Q scan. Electronically Signed   By: Dorise Bullion III M.D   On: 05/15/2018 15:33  Echocardiogram 7/20 Study Conclusions  - Left ventricle: The cavity size was normal. Wall thickness was   increased in a pattern of mild LVH. Systolic function was normal.   The estimated ejection fraction was in the range of 60% to 65%.   Wall motion was normal; there were no regional wall motion   abnormalities. Doppler parameters are consistent with abnormal   left ventricular relaxation (grade 1 diastolic dysfunction). - Ventricular septum: The contour showed diastolic flattening and   systolic flattening. - Mitral valve: Calcified annulus. Mildly thickened leaflets . - Left atrium: The atrium was moderately to severely dilated. - Right ventricle: The cavity size was moderately dilated. Wall   thickness was normal. - Right atrium: The atrium was moderately to severely dilated. - Atrial septum: No defect or patent foramen ovale was identified. - Tricuspid valve: There was trivial regurgitation. - Pulmonic valve: There was no significant regurgitation. - Inferior vena cava:  The vessel was normal in size. The   respirophasic diameter changes were blunted (< 50%).  Impressions:  - LV EF preserved with grade 1 diastolic dysfunction. Cannot   estimate right sided filling pressures due to lack of complete   trace of TR jet. Biatrial enlargement, moderate dilation of RV.    Subjective: Feels normal today.  No dyspnea, no cough, no wheezing, no sputum, no fever.  No chest pain, no leg swelling, no orthopnea.  No confusion, no weakness.  Discharge Exam: Vitals:   06/07/18 0512 06/07/18 0755  BP: 113/62   Pulse: 63 71  Resp: 18 18  Temp: 97.6 F (36.4 C)   SpO2: 94% 97%   Vitals:   06/07/18 0016 06/07/18 0512 06/07/18 0515 06/07/18 0755  BP: (!) 153/77 113/62    Pulse: 72 63  71  Resp: 18 18  18   Temp: (!) 97.5 F (36.4 C) 97.6 F (36.4 C)    TempSrc: Oral Oral    SpO2: Marland Kitchen)  89% 94%  97%  Weight:   67.8 kg (149 lb 8 oz)   Height:        General: Pt is alert, awake, not in acute distress Cardiovascular: RRR, S1/S2 +, no rubs, no gallops Respiratory: CTA bilaterally, no wheezing, no rhonchi Abdominal: Soft, NT, ND, bowel sounds + Extremities: no edema, no cyanosis    The results of significant diagnostics from this hospitalization (including imaging, microbiology, ancillary and laboratory) are listed below for reference.     Microbiology: Recent Results (from the past 240 hour(s))  Urine Culture     Status: Abnormal   Collection Time: 06/05/18  1:32 AM  Result Value Ref Range Status   Specimen Description URINE, RANDOM  Final   Special Requests   Final    NONE Performed at [facility], [address], [place], [place] [address]    Culture >=100,000 COLONIES/mL ESCHERICHIA COLI (A)  Final   Report Status 06/07/2018 FINAL  Final   Organism ID, Bacteria ESCHERICHIA COLI (A)  Final      Susceptibility   Escherichia coli - MIC*    AMPICILLIN <=2 SENSITIVE Sensitive     CEFAZOLIN <=4 SENSITIVE Sensitive     CEFTRIAXONE <=1  SENSITIVE Sensitive     CIPROFLOXACIN <=0.25 SENSITIVE Sensitive     GENTAMICIN <=1 SENSITIVE Sensitive     IMIPENEM <=0.25 SENSITIVE Sensitive     NITROFURANTOIN <=16 SENSITIVE Sensitive     TRIMETH/SULFA <=20 SENSITIVE Sensitive     AMPICILLIN/SULBACTAM <=2 SENSITIVE Sensitive     PIP/TAZO <=4 SENSITIVE Sensitive     Extended ESBL NEGATIVE Sensitive     * >=100,000 COLONIES/mL ESCHERICHIA COLI  Culture, blood (Routine X 2) w Reflex to ID Panel     Status: None (Preliminary result)   Collection Time: 06/05/18  4:20 AM  Result Value Ref Range Status   Specimen Description BLOOD RIGHT ANTECUBITAL  Final   Special Requests   Final    BOTTLES DRAWN AEROBIC AND ANAEROBIC Blood Culture adequate volume   Culture   Final    NO GROWTH 2 DAYS Performed at [facility], [address], [place], [place] [address]    Report Status PENDING  Incomplete  Culture, blood (Routine X 2) w Reflex to ID Panel     Status: None (Preliminary result)   Collection Time: 06/05/18  4:37 AM  Result Value Ref Range Status   Specimen Description BLOOD RIGHT WRIST  Final   Special Requests   Final    BOTTLES DRAWN AEROBIC AND ANAEROBIC Blood Culture results may not be optimal due to an inadequate volume of blood received in culture bottles   Culture   Final    NO GROWTH 2 DAYS Performed at [facility], [place] [address], [place], [place] [address]    Report Status PENDING  Incomplete     Labs: BNP (last 3 results) Recent Labs    09/14/17 1305 01/27/18 1808 06/05/18 0053  BNP 221.7* 305.0* 175.1*   Basic Metabolic Panel: Recent Labs  Lab 06/05/18 0053 06/06/18 0511 06/07/18 0525  NA 142 143 140  K 3.1* 4.2 3.6  CL 106 109 107  CO2 23 28 26   GLUCOSE 123* 106* 105*  BUN 19 19 21   CREATININE 1.02* 1.50* 1.06*  CALCIUM 8.8* 9.0 8.9   Liver Function Tests: No results for input(s): AST, ALT, ALKPHOS, BILITOT, PROT, ALBUMIN in the last 168 hours. No results for input(s): LIPASE, AMYLASE  in the  last 168 hours. No results for input(s): AMMONIA in the last 168 hours. CBC: Recent Labs  Lab 06/05/18 0053 06/06/18 0511  WBC 8.4 7.5  NEUTROABS 6.7  --   HGB 13.2 12.5  HCT 42.6 40.4  MCV 80.1 81.1  PLT 170 160   Cardiac Enzymes: Recent Labs  Lab 06/05/18 0523 06/05/18 1116 06/05/18 1632  TROPONINI <0.03 <0.03 <0.03   BNP: Invalid input(s): POCBNP CBG: No results for input(s): GLUCAP in the last 168 hours. D-Dimer No results for input(s): DDIMER in the last 72 hours. Hgb A1c No results for input(s): HGBA1C in the last 72 hours. Lipid Profile No results for input(s): CHOL, HDL, LDLCALC, TRIG, CHOLHDL, LDLDIRECT in the last 72 hours. Thyroid function studies Recent Labs    06/05/18 0523  TSH 1.006   Anemia work up No results for input(s): VITAMINB12, FOLATE, FERRITIN, TIBC, IRON, RETICCTPCT in the last 72 hours. Urinalysis    Component Value Date/Time   COLORURINE YELLOW 06/05/2018 0132   APPEARANCEUR HAZY (A) 06/05/2018 0132   LABSPEC 1.018 06/05/2018 0132   PHURINE 5.0 06/05/2018 0132   GLUCOSEU NEGATIVE 06/05/2018 0132   HGBUR NEGATIVE 06/05/2018 0132   BILIRUBINUR NEGATIVE 06/05/2018 0132   KETONESUR NEGATIVE 06/05/2018 0132   PROTEINUR 100 (A) 06/05/2018 0132   UROBILINOGEN 0.2 05/20/2013 0934   NITRITE POSITIVE (A) 06/05/2018 0132   LEUKOCYTESUR LARGE (A) 06/05/2018 0132   Sepsis Labs Invalid input(s): PROCALCITONIN,  WBC,  LACTICIDVEN Microbiology Recent Results (from the past 240 hour(s))  Urine Culture     Status: Abnormal   Collection Time: 06/05/18  1:32 AM  Result Value Ref Range Status   Specimen Description URINE, RANDOM  Final   Special Requests   Final    NONE Performed at [facility], [place] [address] [place] [address], [place], [place] [address]    Culture >=100,000 COLONIES/mL ESCHERICHIA COLI (A)  Final   Report Status 06/07/2018 FINAL  Final   Organism ID, Bacteria ESCHERICHIA COLI (A)  Final      Susceptibility   Escherichia coli  - MIC*    AMPICILLIN <=2 SENSITIVE Sensitive     CEFAZOLIN <=4 SENSITIVE Sensitive     CEFTRIAXONE <=1 SENSITIVE Sensitive     CIPROFLOXACIN <=0.25 SENSITIVE Sensitive     GENTAMICIN <=1 SENSITIVE Sensitive     IMIPENEM <=0.25 SENSITIVE Sensitive     NITROFURANTOIN <=16 SENSITIVE Sensitive     TRIMETH/SULFA <=20 SENSITIVE Sensitive     AMPICILLIN/SULBACTAM <=2 SENSITIVE Sensitive     PIP/TAZO <=4 SENSITIVE Sensitive     Extended ESBL NEGATIVE Sensitive     * >=100,000 COLONIES/mL ESCHERICHIA COLI  Culture, blood (Routine X 2) w Reflex to ID Panel     Status: None (Preliminary result)   Collection Time: 06/05/18  4:20 AM  Result Value Ref Range Status   Specimen Description BLOOD RIGHT ANTECUBITAL  Final   Special Requests   Final    BOTTLES DRAWN AEROBIC AND ANAEROBIC Blood Culture adequate volume   Culture   Final    NO GROWTH 2 DAYS Performed at [facility], [address], [place], [place] [address]    Report Status PENDING  Incomplete  Culture, blood (Routine X 2) w Reflex to ID Panel     Status: None (Preliminary result)   Collection Time: 06/05/18  4:37 AM  Result Value Ref Range Status   Specimen Description BLOOD RIGHT WRIST  Final   Special Requests   Final    BOTTLES  DRAWN AEROBIC AND ANAEROBIC Blood Culture results may not be optimal due to an inadequate volume of blood received in culture bottles   Culture   Final    NO GROWTH 2 DAYS Performed at [facility] [address] [place],  [address]    Report Status PENDING  Incomplete     Time coordinating discharge: 45 minutes       SIGNED:   Edwin Dada, MD  [facility] 06/07/2018, 4:03 PM

## 2018-06-07 NOTE — Plan of Care (Signed)
  Problem: Activity: Goal: Risk for activity intolerance will decrease Outcome: Progressing   Problem: Pain Managment: Goal: General experience of comfort will improve Outcome: Progressing   Problem: Skin Integrity: Goal: Risk for impaired skin integrity will decrease Outcome: Progressing   

## 2018-06-07 NOTE — Progress Notes (Signed)
Progress Note  Patient Name: [name] Date of Encounter: 06/07/2018  Primary Cardiologist: Peter Martinique, MD   Subjective  No more syncope. No new concerns this AM. Met with pulmonary yesterday, understands that the plan is to keep O2 at higher level and not turn down. Discussed echo results yesterday, unable to estimate pulmonary pressures due to lack of TR jet. She feels well and is looking forward to going home.  Inpatient Medications    Scheduled Meds: . aspirin EC  81 mg Oral Daily  . cephALEXin  250 mg Oral Q12H  . enoxaparin (LOVENOX) injection  30 mg Subcutaneous Q24H  . [START ON 06/08/2018] furosemide  20 mg Oral Daily  . isosorbide mononitrate  30 mg Oral Daily  . levothyroxine  137 mcg Oral QAC breakfast  . losartan  25 mg Oral Daily  . metoprolol tartrate  50 mg Oral BID  . rosuvastatin  20 mg Oral QHS  . sodium chloride flush  3 mL Intravenous Q12H  . Tdap  0.5 mL Intramuscular Once  . umeclidinium-vilanterol  1 puff Inhalation Daily   Continuous Infusions:  PRN Meds: acetaminophen **OR** acetaminophen, hydroxypropyl methylcellulose / hypromellose, ipratropium-albuterol, nitroGLYCERIN, ondansetron **OR** ondansetron (ZOFRAN) IV   Vital Signs    Vitals:   06/07/18 0016 06/07/18 0512 06/07/18 0515 06/07/18 0755  BP: (!) 153/77 113/62    Pulse: 72 63  71  Resp: '18 18  18  '$ Temp: (!) 97.5 F (36.4 C) 97.6 F (36.4 C)    TempSrc: Oral Oral    SpO2: (!) 89% 94%  97%  Weight:   149 lb 8 oz (67.8 kg)   Height:        Intake/Output Summary (Last 24 hours) at 06/07/2018 0825 Last data filed at 06/07/2018 0500 Gross per 24 hour  Intake 960 ml  Output 725 ml  Net 235 ml   Filed Weights   06/05/18 0626 06/06/18 0540 06/07/18 0515  Weight: 150 lb 3.2 oz (68.1 kg) 147 lb 4.8 oz (66.8 kg) 149 lb 8 oz (67.8 kg)    Telemetry    Normal sinus rhythm - Personally Reviewed  ECG    06/05/18 normal sinus rhythm - Personally Reviewed  Physical Exam   GEN:  No acute distress.  Large area of ecchymoses across face and arms. Neck: supple, no JVD Cardiac: regular S1 and S2, no murmurs, rubs, or gallops.  Respiratory: Clear to auscultation bilaterally. GI: Soft, nontender, non-distended. Bowel sounds normal MS: trace bilateral ankle edema; No deformity. Neuro:  Nonfocal, moves all limbs independently Psych: Normal affect   Labs    Chemistry Recent Labs  Lab 06/05/18 0053 06/06/18 0511 06/07/18 0525  NA 142 143 140  K 3.1* 4.2 3.6  CL 106 109 107  CO2 '23 28 26  '$ GLUCOSE 123* 106* 105*  BUN '19 19 21  '$ CREATININE 1.02* 1.50* 1.06*  CALCIUM 8.8* 9.0 8.9  GFRNONAA 48* 30* 46*  GFRAA 56* 35* 54*  ANIONGAP '13 6 7     '$ Hematology Recent Labs  Lab 06/05/18 0053 06/06/18 0511  WBC 8.4 7.5  RBC 5.32* 4.98  HGB 13.2 12.5  HCT 42.6 40.4  MCV 80.1 81.1  MCH 24.8* 25.1*  MCHC 31.0 30.9  RDW 14.4 14.8  PLT 170 160    Cardiac Enzymes Recent Labs  Lab 06/05/18 0523 06/05/18 1116 06/05/18 1632  TROPONINI <0.03 <0.03 <0.03    Recent Labs  Lab 06/05/18 0054  TROPIPOC 0.04  BNP Recent Labs  Lab 06/05/18 0053  BNP 845.8*     DDimer No results for input(s): DDIMER in the last 168 hours.   Radiology    No results found.  Cardiac Studies   Echo 06/06/18, personally read by me: Left ventricle: The cavity size was normal. Wall thickness was   increased in a pattern of mild LVH. Systolic function was normal.   The estimated ejection fraction was in the range of 60% to 65%.   Wall motion was normal; there were no regional wall motion   abnormalities. Doppler parameters are consistent with abnormal   left ventricular relaxation (grade 1 diastolic dysfunction). - Ventricular septum: The contour showed diastolic flattening and   systolic flattening. - Mitral valve: Calcified annulus. Mildly thickened leaflets . - Left atrium: The atrium was moderately to severely dilated. - Right ventricle: The cavity size was moderately  dilated. Wall   thickness was normal. - Right atrium: The atrium was moderately to severely dilated. - Atrial septum: No defect or patent foramen ovale was identified. - Tricuspid valve: There was trivial regurgitation. - Pulmonic valve: There was no significant regurgitation. - Inferior vena cava: The vessel was normal in size. The   respirophasic diameter changes were blunted (< 50%).  Impressions:  - LV EF preserved with grade 1 diastolic dysfunction. Cannot   estimate right sided filling pressures due to lack of complete   trace of TR jet. Biatrial enlargement, moderate dilation of RV.  Patient Profile     84 y.o. female with a hx of CAD s/p remote angioplasty of RCA 1610, diastolic CHF, pulmonary hypertension, COPD on home oxygen, PAD, carotid artery disease s/p right CEA, chronic left subclavian occlusion, hyperthyroidism, hypertension, hyperlipidemia who is being followed for syncope and heart failure  Assessment & Plan    Syncope: no additional episodes here. History sounds as though the episodes are closely linked to her O2 levels. No significant arrhythmia seen on telemetry. Per pulmonary plan is to keep O2 up higher, as hypoxia may be contributing to her syncope.  Acute on chronic diastolic heart failure: appears euvolemic. Do not think that her HFpEF is contributing. I read her echo yesterday, did not have full TR jet to estimate PA pressures but moderately dilated RV does suggest element of pulmonary hypertension.  Hypertension, hyperlipidemia, history of CAD: not active issues at this time.   Time Spent Directly with Patient: I have spent a total of 25 minutes with the patient reviewing hospital notes, telemetry, EKGs, labs and examining the patient as well as establishing an assessment and plan that was discussed personally with the patient.  > 50% of time was spent in direct patient care.  Length of Stay:  LOS: 0 days   Buford Dresser, MD, PhD [facility]   06/07/2018, 8:25 AM  [facility] will sign off in anticipation of discharge.   Medication Recommendations:  Continue current medications at discharge. Other recommendations (labs, testing, etc):  None from cardiac standpoint. Follow up as an outpatient:  With Dr. Martinique as needed.     For questions or updates, please contact [place] Please consult [email or web address] for contact info under Cardiology/STEMI.

## 2018-06-08 ENCOUNTER — Telehealth: Payer: Self-pay | Admitting: Pulmonary Disease

## 2018-06-08 DIAGNOSIS — J449 Chronic obstructive pulmonary disease, unspecified: Secondary | ICD-10-CM

## 2018-06-08 NOTE — Progress Notes (Signed)
Pulmonary Individual Treatment Plan  Patient Details  Name: [name] MRN: [id number] Date of Birth: Nov 03, 1929 Referring Provider:     Pulmonary Rehab Walk Test from 01/22/2018 in [place]  Referring Provider  Dr. Elsworth Soho      Initial Encounter Date:    Pulmonary Rehab Walk Test from 01/22/2018 in [place]  Date  01/22/18      Visit Diagnosis: Stage 2 moderate COPD by GOLD classification ([place])  Patient's Home Medications on Admission:   Current Outpatient Medications:  .  acetaminophen (TYLENOL) 500 MG tablet, Take 1,000 mg by mouth at bedtime. , Disp: , Rfl:  .  Aloe-Sodium Chloride (AYR SALINE NASAL GEL NA), Place 1 application into the nose daily as needed (dryness)., Disp: , Rfl:  .  ANORO ELLIPTA 62.5-25 MCG/INH AEPB, INHALE 1 PUFF INTO THE LUNGS DAILY, Disp: 60 each, Rfl: 5 .  aspirin 81 MG tablet, Take 81 mg by mouth 2 (two) times daily. , Disp: , Rfl:  .  Carboxymethylcellul-Glycerin (LUBRICATING EYE DROPS OP), Apply 1 drop to eye daily as needed (dry eyes)., Disp: , Rfl:  .  cephALEXin (KEFLEX) 500 MG capsule, Take 1 capsule (500 mg total) by mouth 2 (two) times daily for 3 days., Disp: 6 capsule, Rfl: 0 .  denosumab (PROLIA) 60 MG/ML SOLN injection, Inject 60 mg into the skin every 6 (six) months. Administer in upper arm, thigh, or abdomen, Disp: , Rfl:  .  furosemide (LASIX) 20 MG tablet, Take 1 tablet (20 mg total) by mouth daily., Disp: 90 tablet, Rfl: 1 .  isosorbide mononitrate (IMDUR) 30 MG 24 hr tablet, TAKE 1 TABLET(30 MG) BY MOUTH DAILY, Disp: 90 tablet, Rfl: 2 .  levothyroxine (SYNTHROID, LEVOTHROID) 137 MCG tablet, Take 137 mcg by mouth daily.  , Disp: , Rfl:  .  metoprolol tartrate (LOPRESSOR) 50 MG tablet, Take 1 tablet (50 mg total) by mouth 2 (two) times daily., Disp: 180 tablet, Rfl: 3 .  multivitamin-lutein (OCUVITE-LUTEIN) CAPS capsule, Take 1 capsule by mouth daily., Disp: , Rfl:  .   nitroGLYCERIN (NITROSTAT) 0.4 MG SL tablet, Place 1 tablet (0.4 mg total) under the tongue every 5 (five) minutes as needed for chest pain (MAX 3 doses)., Disp: 25 tablet, Rfl: 3 .  OXYGEN, Inhale 8 L into the lungs daily. , Disp: , Rfl:  .  Probiotic Product (PROBIOTIC DAILY PO), Take 1 tablet by mouth daily. , Disp: , Rfl:  .  rosuvastatin (CRESTOR) 40 MG tablet, Take 20 mg by mouth at bedtime. , Disp: , Rfl:   Past Medical History: Past Medical History:  Diagnosis Date  . Arthritis   . B12 deficiency   . Basal cell cancer    Nose  . Carotid artery occlusion   . Cataract   . Chronic diastolic CHF (congestive heart failure) ([place])   . Colon polyps   . COPD (chronic obstructive pulmonary disease) ([place])    STAGE 2  . Coronary artery disease    status post angioplasty of the mid RCA in 1995  . Dyspnea   . First degree atrioventricular block   . History of obesity   . Hyperlipidemia   . Hypertension    well controlled  . Hyperthyroidism   . Lichen sclerosus et atrophicus of the vulva   . Osteopenia   . PAD (peripheral artery disease) (H[facility])    with stable claudication  . Pulmonary fibrosis (H[facility])    wears 4L  home O2 at baseline    Tobacco Use: Social History   Tobacco Use  Smoking Status Former Smoker  . Packs/day: 1.00  . Years: 40.00  . Pack years: 40.00  . Types: Cigarettes  . Last attempt to quit: 11/18/1993  . Years since quitting: 24.5  Smokeless Tobacco Never Used    Labs: Recent Chemical engineer    Labs for ITP Cardiac and Pulmonary Rehab Latest Ref Rng & Units 05/27/2008 12/28/2016   Hemoglobin A1c 4.8 - 5.6 % - 5.0   TCO2 - 23 -      Capillary Blood Glucose: Lab Results  Component Value Date   GLUCAP 96 12/30/2016   GLUCAP 97 12/30/2016   GLUCAP 132 (H) 12/29/2016   GLUCAP 140 (H) 12/29/2016   GLUCAP 123 (H) 12/29/2016     Pulmonary Assessment Scores: Pulmonary Assessment Scores    Row Name 01/15/18 1746 01/23/18 0704       ADL UCSD   ADL  Phase  Entry  Entry    SOB Score total  49  -      CAT Score   CAT Score  20 Entry  -      mMRC Score   mMRC Score  -  0       Pulmonary Function Assessment: Pulmonary Function Assessment - 01/12/18 1453      Breath   Bilateral Breath Sounds  Clear    Shortness of Breath  Limiting activity;Yes       Exercise Target Goals:    Exercise Program Goal: Individual exercise prescription set using results from initial 6 min walk test and THRR while considering  patient's activity barriers and safety.    Exercise Prescription Goal: Initial exercise prescription builds to 30-45 minutes a day of aerobic activity, 2-3 days per week.  Home exercise guidelines will be given to patient during program as part of exercise prescription that the participant will acknowledge.  Activity Barriers & Risk Stratification: Activity Barriers & Cardiac Risk Stratification - 01/12/18 1436      Activity Barriers & Cardiac Risk Stratification   Activity Barriers  Shortness of Breath;Deconditioning       6 Minute Walk: 6 Minute Walk    Row Name 01/23/18 0700         6 Minute Walk   Phase  Initial     Distance  800 feet     Walk Time  - 5 minutes 10 seconds     # of Rest Breaks  1 stopped by ep due to low sat     MPH  1.51     METS  2.15     RPE  13     Perceived Dyspnea   2     Symptoms  Yes (comment)     Comments  used wheelchair and forehead probe-very slow o2 recovery     Resting HR  68 bpm     Resting BP  162/78     Resting Oxygen Saturation   91 %     Exercise Oxygen Saturation  during 6 min walk  78 %     Max Ex. HR  137 bpm     Max Ex. BP  180/64       Interval HR   1 Minute HR  100     2 Minute HR  118     3 Minute HR  130     4 Minute HR  132     5 Minute  HR  132     6 Minute HR  137     2 Minute Post HR  110     Interval Heart Rate?  Yes       Interval Oxygen   Interval Oxygen?  Yes     Baseline Oxygen Saturation %  91 %     1 Minute Oxygen Saturation %  95 %      1 Minute Liters of Oxygen  10 L     2 Minute Oxygen Saturation %  84 %     2 Minute Liters of Oxygen  10 L     3 Minute Oxygen Saturation %  81 %     3 Minute Liters of Oxygen  15 L     4 Minute Oxygen Saturation %  85 % rest break by EP     4 Minute Liters of Oxygen  15 L     5 Minute Oxygen Saturation %  85 %     5 Minute Liters of Oxygen  15 L     6 Minute Oxygen Saturation %  78 %     6 Minute Liters of Oxygen  15 L     2 Minute Post Oxygen Saturation %  82 % did not recover to 91% until 4 minutes post 99mt     2 Minute Post Liters of Oxygen  10 L        Oxygen Initial Assessment: Oxygen Initial Assessment - 01/23/18 0703      Initial 6 min Walk   Oxygen Used  Continuous;E-Tanks    Liters per minute  15      Program Oxygen Prescription   Program Oxygen Prescription  Continuous;E-Tanks    Liters per minute  -- 10-15    Comments  Will get guidlines from Dr. AElsworth Sohofor O2 Sat and liter flow       Oxygen Re-Evaluation: Oxygen Re-Evaluation    Row Name 01/26/18 1346 02/20/18 1045 03/23/18 1653 04/21/18 0744 05/15/18 1023     Program Oxygen Prescription   Program Oxygen Prescription  Continuous;E-Tanks  Continuous;E-Tanks  Continuous;E-Tanks  Continuous;E-Tanks  Continuous;E-Tanks   Liters per minute  -  15 NRB  15 NRB  15  25 25L with walking and 15L with seated   Comments  -  Patient needs to stay above 90%--will try 25 liters  -  Patient needs to stay above 90%--will try 25 liters  -     Home Oxygen   Home Oxygen Device  -  Portable Concentrator;Home Concentrator;E-Tanks  Portable Concentrator;Home Concentrator;E-Tanks  Portable Concentrator;Home Concentrator;E-Tanks  Portable Concentrator;Home Concentrator;E-Tanks   Sleep Oxygen Prescription  -  Continuous  Continuous  Continuous  Continuous   Liters per minute  -  '6  6  6  6   ' Home Exercise Oxygen Prescription  -  Continuous Exercise at home is not encouraged at this time.  None Not exercising at home due to high liter  flow needs  None  None   Liters per minute  -  15  -  15  -   Home at Rest Exercise Oxygen Prescription  -  Continuous  Continuous  Continuous  Continuous   Liters per minute  -  '6  6  6  6 ' 6-8   Compliance with Home Oxygen Use  -  Yes  Yes  Yes  No     Goals/Expected Outcomes   Short Term Goals  -  To learn and exhibit  compliance with exercise, home and travel O2 prescription;To learn and understand importance of monitoring SPO2 with pulse oximeter and demonstrate accurate use of the pulse oximeter.;To learn and understand importance of maintaining oxygen saturations>88%;To learn and demonstrate proper pursed lip breathing techniques or other breathing techniques.;To learn and demonstrate proper use of respiratory medications  To learn and exhibit compliance with exercise, home and travel O2 prescription;To learn and understand importance of monitoring SPO2 with pulse oximeter and demonstrate accurate use of the pulse oximeter.;To learn and understand importance of maintaining oxygen saturations>88%;To learn and demonstrate proper pursed lip breathing techniques or other breathing techniques.;To learn and demonstrate proper use of respiratory medications  To learn and exhibit compliance with exercise, home and travel O2 prescription;To learn and understand importance of monitoring SPO2 with pulse oximeter and demonstrate accurate use of the pulse oximeter.;To learn and understand importance of maintaining oxygen saturations>88%;To learn and demonstrate proper pursed lip breathing techniques or other breathing techniques.;To learn and demonstrate proper use of respiratory medications  To learn and exhibit compliance with exercise, home and travel O2 prescription;To learn and understand importance of monitoring SPO2 with pulse oximeter and demonstrate accurate use of the pulse oximeter.;To learn and understand importance of maintaining oxygen saturations>88%;To learn and demonstrate proper pursed lip  breathing techniques or other breathing techniques.;To learn and demonstrate proper use of respiratory medications   Long  Term Goals  -  Exhibits compliance with exercise, home and travel O2 prescription;Verbalizes importance of monitoring SPO2 with pulse oximeter and return demonstration;Maintenance of O2 saturations>88%;Exhibits proper breathing techniques, such as pursed lip breathing or other method taught during program session;Compliance with respiratory medication;Demonstrates proper use of MDI's  Exhibits compliance with exercise, home and travel O2 prescription;Verbalizes importance of monitoring SPO2 with pulse oximeter and return demonstration;Maintenance of O2 saturations>88%;Exhibits proper breathing techniques, such as pursed lip breathing or other method taught during program session;Compliance with respiratory medication;Demonstrates proper use of MDI's  Exhibits compliance with exercise, home and travel O2 prescription;Verbalizes importance of monitoring SPO2 with pulse oximeter and return demonstration;Maintenance of O2 saturations>88%;Exhibits proper breathing techniques, such as pursed lip breathing or other method taught during program session;Compliance with respiratory medication;Demonstrates proper use of MDI's  Exhibits compliance with exercise, home and travel O2 prescription;Verbalizes importance of monitoring SPO2 with pulse oximeter and return demonstration;Maintenance of O2 saturations>88%;Exhibits proper breathing techniques, such as pursed lip breathing or other method taught during program session;Compliance with respiratory medication;Demonstrates proper use of MDI's   Goals/Expected Outcomes  -  compliance  Patient to maintain compliance  Patient to maintain compliance  -   Row Name 06/08/18 0918             Program Oxygen Prescription   Program Oxygen Prescription  Continuous;E-Tanks       Liters per minute  25       Comments  Patient needs 25 liters with walking  15 seated         Home Oxygen   Home Oxygen Device  Portable Concentrator;Home Concentrator;E-Tanks       Sleep Oxygen Prescription  Continuous       Liters per minute  6       Home Exercise Oxygen Prescription  None Can not exercise at home since she has high liter flow.       Home at Rest Exercise Oxygen Prescription  Continuous       Liters per minute  6 6-8       Compliance with Home Oxygen Use  Yes  Goals/Expected Outcomes   Short Term Goals  To learn and exhibit compliance with exercise, home and travel O2 prescription;To learn and understand importance of monitoring SPO2 with pulse oximeter and demonstrate accurate use of the pulse oximeter.;To learn and understand importance of maintaining oxygen saturations>88%;To learn and demonstrate proper pursed lip breathing techniques or other breathing techniques.;To learn and demonstrate proper use of respiratory medications       Long  Term Goals  Exhibits compliance with exercise, home and travel O2 prescription;Verbalizes importance of monitoring SPO2 with pulse oximeter and return demonstration;Maintenance of O2 saturations>88%;Exhibits proper breathing techniques, such as pursed lip breathing or other method taught during program session;Compliance with respiratory medication;Demonstrates proper use of MDI's          Oxygen Discharge (Final Oxygen Re-Evaluation): Oxygen Re-Evaluation - 06/08/18 0918      Program Oxygen Prescription   Program Oxygen Prescription  Continuous;E-Tanks    Liters per minute  25    Comments  Patient needs 25 liters with walking 15 seated      Home Oxygen   Home Oxygen Device  Portable Concentrator;Home Concentrator;E-Tanks    Sleep Oxygen Prescription  Continuous    Liters per minute  6    Home Exercise Oxygen Prescription  None Can not exercise at home since she has high liter flow.    Home at Rest Exercise Oxygen Prescription  Continuous    Liters per minute  6 6-8    Compliance with Home  Oxygen Use  Yes      Goals/Expected Outcomes   Short Term Goals  To learn and exhibit compliance with exercise, home and travel O2 prescription;To learn and understand importance of monitoring SPO2 with pulse oximeter and demonstrate accurate use of the pulse oximeter.;To learn and understand importance of maintaining oxygen saturations>88%;To learn and demonstrate proper pursed lip breathing techniques or other breathing techniques.;To learn and demonstrate proper use of respiratory medications    Long  Term Goals  Exhibits compliance with exercise, home and travel O2 prescription;Verbalizes importance of monitoring SPO2 with pulse oximeter and return demonstration;Maintenance of O2 saturations>88%;Exhibits proper breathing techniques, such as pursed lip breathing or other method taught during program session;Compliance with respiratory medication;Demonstrates proper use of MDI's       Initial Exercise Prescription: Initial Exercise Prescription - 01/23/18 0700      Date of Initial Exercise RX and Referring Provider   Date  01/22/18    Referring Provider  Dr. Elsworth Soho      Oxygen   Oxygen  Continuous    Liters  -- 10-15      Recumbant Bike   Level  2    Watts  10    Minutes  17      NuStep   Level  2    SPM  80    Minutes  17    METs  1.5      Track   Laps  5    Minutes  17      Prescription Details   Frequency (times per week)  2    Duration  Progress to 45 minutes of aerobic exercise without signs/symptoms of physical distress      Intensity   THRR 40-80% of Max Heartrate  54-107    Ratings of Perceived Exertion  11-13    Perceived Dyspnea  0-4      Progression   Progression  Continue progressive overload as per policy without signs/symptoms or physical distress.  Resistance Training   Training Prescription  Yes    Weight  orange bands    Reps  10-15       Perform Capillary Blood Glucose checks as needed.  Exercise Prescription Changes:  Exercise  Prescription Changes    Row Name 02/05/18 1300 02/17/18 1231 03/03/18 1200 03/31/18 1200 04/14/18 1200     Response to Exercise   Blood Pressure (Admit)  144/52  122/58  136/62  124/64  124/74   Blood Pressure (Exercise)  178/72  146/50  138/70  144/58  164/64   Blood Pressure (Exit)  128/70  128/58  124/70  120/60  130/80   Heart Rate (Admit)  65 bpm  74 bpm  66 bpm  71 bpm  75 bpm   Heart Rate (Exercise)  93 bpm  92 bpm  87 bpm  103 bpm  104 bpm   Heart Rate (Exit)  68 bpm  81 bpm  61 bpm  99 bpm  67 bpm   Oxygen Saturation (Admit)  92 %  98 %  96 %  99 %  99 %   Oxygen Saturation (Exercise)  88 %  89 %  88 %  88 %  80 %   Oxygen Saturation (Exit)  96 %  100 %  97 %  100 %  97 %   Rating of Perceived Exertion (Exercise)  '15  13  12  13  11   ' Perceived Dyspnea (Exercise)  2  1  0  1  1   Duration  Progress to 45 minutes of aerobic exercise without signs/symptoms of physical distress  Progress to 45 minutes of aerobic exercise without signs/symptoms of physical distress  Progress to 45 minutes of aerobic exercise without signs/symptoms of physical distress  Progress to 45 minutes of aerobic exercise without signs/symptoms of physical distress  Progress to 45 minutes of aerobic exercise without signs/symptoms of physical distress   Intensity  Other (comment)  Other (comment)  Other (comment)  Other (comment)  Other (comment)     Progression   Progression  Continue to progress workloads to maintain intensity without signs/symptoms of physical distress. 40-80% HRR  Continue to progress workloads to maintain intensity without signs/symptoms of physical distress. 40-80% HRR  Continue to progress workloads to maintain intensity without signs/symptoms of physical distress. 40-80% HRR  Continue to progress workloads to maintain intensity without signs/symptoms of physical distress. 40-80% HRR  Continue to progress workloads to maintain intensity without signs/symptoms of physical distress. 40-80% HRR      Resistance Training   Training Prescription  Yes  Yes  Yes  Yes  Yes   Weight  orange bands  orange bands  orange bands  orange bands  orange bands   Reps  10-15  10-15  10-15  10-15  10-15   Time  10 Minutes  10 Minutes  10 Minutes  10 Minutes  10 Minutes     Oxygen   Oxygen  Continuous  Continuous  Continuous  Continuous  Continuous   Liters  15 L, 100% NRB  15 L, 100% NRB  15 L, 100% NRB  15 L, 100% NRB  15 L, 100% NRB     Recumbant Bike   Level  2.5  2.5  -  2  2   Watts  10  10  -  -  -   Minutes  17  17  -  17  17     NuStep   Level  $'4  4  4  4  4   'X$ SPM  80  80  80  80  80   Minutes  17  34  34  34  34   METs  2.4  2.4  2.2  1.9  1.9   Row Name 04/28/18 1300 05/12/18 1200 05/26/18 1200         Response to Exercise   Blood Pressure (Admit)  158/74  140/58  152/74     Blood Pressure (Exercise)  140/68  146/50  124/62     Blood Pressure (Exit)  123/60  102/56  110/56     Heart Rate (Admit)  73 bpm  70 bpm  86 bpm     Heart Rate (Exercise)  100 bpm  100 bpm  92 bpm     Heart Rate (Exit)  91 bpm  82 bpm  86 bpm     Oxygen Saturation (Admit)  97 %  98 %  97 %     Oxygen Saturation (Exercise)  85 %  89 %  88 %     Oxygen Saturation (Exit)  89 %  96 %  86 %     Rating of Perceived Exertion (Exercise)  '11  13  11     '$ Perceived Dyspnea (Exercise)  1  0  2     Duration  Progress to 45 minutes of aerobic exercise without signs/symptoms of physical distress  Progress to 45 minutes of aerobic exercise without signs/symptoms of physical distress  Progress to 45 minutes of aerobic exercise without signs/symptoms of physical distress     Intensity  THRR unchanged  THRR unchanged  THRR unchanged       Progression   Progression  Continue to progress workloads to maintain intensity without signs/symptoms of physical distress.  Continue to progress workloads to maintain intensity without signs/symptoms of physical distress.  Continue to progress workloads to maintain intensity without  signs/symptoms of physical distress.       Resistance Training   Training Prescription  Yes  Yes  Yes     Weight  orange bands  orange bands  orange bands     Reps  10-15  10-15  10-15     Time  10 Minutes  10 Minutes  10 Minutes       Interval Training   Interval Training  -  No  No       Oxygen   Oxygen  Continuous  Continuous  Continuous     Liters  25L, 100% NRB  15-25 L, 100% NRB  15-25 L, 100% NRB       NuStep   Level  '4  5  4     '$ SPM  80  80  80     Minutes  '17  17  17     '$ METs  2.1  2.4  2.5       Track   Laps  '22  16  16     '$ Minutes  34  34  34        Exercise Comments:   Exercise Goals and Review:  Exercise Goals    Row Name 01/12/18 1437             Exercise Goals   Increase Physical Activity  Yes       Intervention  Provide advice, education, support and counseling about physical activity/exercise needs.;Develop an individualized exercise prescription for aerobic and resistive training based on initial  evaluation findings, risk stratification, comorbidities and participant's personal goals.       Expected Outcomes  Short Term: Attend rehab on a regular basis to increase amount of physical activity.;Long Term: Add in home exercise to make exercise part of routine and to increase amount of physical activity.;Long Term: Exercising regularly at least 3-5 days a week.       Increase Strength and Stamina  Yes       Intervention  Provide advice, education, support and counseling about physical activity/exercise needs.;Develop an individualized exercise prescription for aerobic and resistive training based on initial evaluation findings, risk stratification, comorbidities and participant's personal goals.       Expected Outcomes  Short Term: Increase workloads from initial exercise prescription for resistance, speed, and METs.;Short Term: Perform resistance training exercises routinely during rehab and add in resistance training at home;Long Term: Improve  cardiorespiratory fitness, muscular endurance and strength as measured by increased METs and functional capacity (6MWT)       Able to understand and use rate of perceived exertion (RPE) scale  Yes       Intervention  Provide education and explanation on how to use RPE scale       Expected Outcomes  Short Term: Able to use RPE daily in rehab to express subjective intensity level;Long Term:  Able to use RPE to guide intensity level when exercising independently       Able to understand and use Dyspnea scale  Yes       Intervention  Provide education and explanation on how to use Dyspnea scale       Expected Outcomes  Short Term: Able to use Dyspnea scale daily in rehab to express subjective sense of shortness of breath during exertion;Long Term: Able to use Dyspnea scale to guide intensity level when exercising independently       Knowledge and understanding of Target Heart Rate Range (THRR)  Yes       Intervention  Provide education and explanation of THRR including how the numbers were predicted and where they are located for reference       Expected Outcomes  Short Term: Able to state/look up THRR;Short Term: Able to use daily as guideline for intensity in rehab;Long Term: Able to use THRR to govern intensity when exercising independently       Understanding of Exercise Prescription  Yes       Intervention  Provide education, explanation, and written materials on patient's individual exercise prescription       Expected Outcomes  Short Term: Able to explain program exercise prescription;Long Term: Able to explain home exercise prescription to exercise independently          Exercise Goals Re-Evaluation : Exercise Goals Re-Evaluation    Row Name 01/26/18 1346 02/20/18 1055 03/23/18 1655 04/21/18 0744 05/15/18 1025     Exercise Goal Re-Evaluation   Exercise Goals Review  -  Increase Physical Activity;Able to understand and use rate of perceived exertion (RPE) scale;Knowledge and understanding of  Target Heart Rate Range (THRR);Understanding of Exercise Prescription;Increase Strength and Stamina;Able to understand and use Dyspnea scale  Increase Physical Activity;Able to understand and use rate of perceived exertion (RPE) scale;Knowledge and understanding of Target Heart Rate Range (THRR);Understanding of Exercise Prescription;Increase Strength and Stamina;Able to understand and use Dyspnea scale  Increase Physical Activity;Able to understand and use rate of perceived exertion (RPE) scale;Knowledge and understanding of Target Heart Rate Range (THRR);Understanding of Exercise Prescription;Increase Strength and Stamina;Able to understand and use Dyspnea  scale  Increase Physical Activity;Able to understand and use rate of perceived exertion (RPE) scale;Knowledge and understanding of Target Heart Rate Range (THRR);Understanding of Exercise Prescription;Increase Strength and Stamina;Able to understand and use Dyspnea scale   Comments  Patient's first day of exercise will be 01/29/18  Patient is currently only doing seated exercise due to her desaturations. Will increase her to 25 liters with NRB next time she is here to see if this will allow her to walk on the track while keeping her o2 sats above 90%  Patient is currently only doing seated exercise due to her desaturations. Not exercising at home at this point due to high liter flow needs.  Patient is currently only doing seated exercise due to her desaturations. Patient is going to walk today on potentially 25 liters with NRB to see what her 02 sats are. Patient is eager to walk. Not exercising at home at this point due to high liter flow needs.  [name] is now walking the track for 30 minutes on 25 liters. Tolerating well. Increased workloads on the NUSTEP-patient feels like she is not finally progressing. High liter flow is a barrier. Will cont. to monitor. Home exercise not appropriate at this time due to high liter flows.   Expected Outcomes  -  Through  exercise at rehab and at home, patient will increase physical capacity and be able to carry out ADL's with ease at home. Patient will also gain the confidence and knowledge to adhere to an exercise regime at home.  Through exercise at rehab and at home, patient will increase physical capacity and be able to carry out ADL's with ease at home. Patient will also gain the confidence and knowledge to adhere to an exercise regime at home.  Through exercise at rehab and at home, the patient will decrease shortness of breath with daily activities and feel confident in carrying out an exercise regime at home.   Through exercise at rehab and at home, the patient will decrease shortness of breath with daily activities and feel confident in carrying out an exercise regime at home.    [place] Name 06/08/18 0920             Exercise Goal Re-Evaluation   Exercise Goals Review  Increase Physical Activity;Able to understand and use rate of perceived exertion (RPE) scale;Knowledge and understanding of Target Heart Rate Range (THRR);Understanding of Exercise Prescription;Increase Strength and Stamina;Able to understand and use Dyspnea scale       Comments  [name] is now walking the track for 30 minutes on 25 liters. Tolerating well. Increased workloads on the NUSTEP-patient feels like she is finally progressing. High liter flow is a barrier. Will cont. to monitor. Home exercise not appropriate at this time due to high liter flows.       Expected Outcomes  Through exercise at rehab and at home, the patient will decrease shortness of breath with daily activities and feel confident in carrying out an exercise regime at home.           Discharge Exercise Prescription (Final Exercise Prescription Changes): Exercise Prescription Changes - 05/26/18 1200      Response to Exercise   Blood Pressure (Admit)  152/74    Blood Pressure (Exercise)  124/62    Blood Pressure (Exit)  110/56    Heart Rate (Admit)  86 bpm    Heart Rate  (Exercise)  92 bpm    Heart Rate (Exit)  86 bpm    Oxygen Saturation (  Admit)  97 %    Oxygen Saturation (Exercise)  88 %    Oxygen Saturation (Exit)  86 %    Rating of Perceived Exertion (Exercise)  11    Perceived Dyspnea (Exercise)  2    Duration  Progress to 45 minutes of aerobic exercise without signs/symptoms of physical distress    Intensity  THRR unchanged      Progression   Progression  Continue to progress workloads to maintain intensity without signs/symptoms of physical distress.      Resistance Training   Training Prescription  Yes    Weight  orange bands    Reps  10-15    Time  10 Minutes      Interval Training   Interval Training  No      Oxygen   Oxygen  Continuous    Liters  15-25 L, 100% NRB      NuStep   Level  4    SPM  80    Minutes  17    METs  2.5      Track   Laps  16    Minutes  34       Nutrition:  Target Goals: Understanding of nutrition guidelines, daily intake of sodium '1500mg'$ , cholesterol '200mg'$ , calories 30% from fat and 7% or less from saturated fats, daily to have 5 or more servings of fruits and vegetables.  Biometrics: Pre Biometrics - 01/12/18 1459      Pre Biometrics   Grip Strength  30 kg        Nutrition Therapy Plan and Nutrition Goals: Nutrition Therapy & Goals - 02/12/18 1224      Nutrition Therapy   Diet  Heart Healthy      Personal Nutrition Goals   Nutrition Goal  Describe the benefit of including fruits, vegetables, whole grains, and low-fat dairy products in a healthy meal plan.      Intervention Plan   Intervention  Prescribe, educate and counsel regarding individualized specific dietary modifications aiming towards targeted core components such as weight, hypertension, lipid management, diabetes, heart failure and other comorbidities.    Expected Outcomes  Short Term Goal: Understand basic principles of dietary content, such as calories, fat, sodium, cholesterol and nutrients.;Long Term Goal: Adherence to  prescribed nutrition plan.       Nutrition Assessments: Nutrition Assessments - 02/12/18 1224      Rate Your Plate Scores   Pre Score  58       Nutrition Goals Re-Evaluation:   Nutrition Goals Discharge (Final Nutrition Goals Re-Evaluation):   Psychosocial: Target Goals: Acknowledge presence or absence of significant depression and/or stress, maximize coping skills, provide positive support system. Participant is able to verbalize types and ability to use techniques and skills needed for reducing stress and depression.  Initial Review & Psychosocial Screening: Initial Psych Review & Screening - 01/12/18 1456      Initial Review   Current issues with  None Identified      Family Dynamics   Good Support System?  Yes      Barriers   Psychosocial barriers to participate in program  There are no identifiable barriers or psychosocial needs.      Screening Interventions   Interventions  Encouraged to exercise       Quality of Life Scores:  Scores of 19 and below usually indicate a poorer quality of life in these areas.  A difference of  2-3 points is a clinically meaningful difference.  A  difference of 2-3 points in the total score of the Quality of Life Index has been associated with significant improvement in overall quality of life, self-image, physical symptoms, and general health in studies assessing change in quality of life.   PHQ-9: Recent Review Flowsheet Data    Depression screen [facility] 2/9 01/12/2018   Decreased Interest 0   Down, Depressed, Hopeless 0   PHQ - 2 Score 0     Interpretation of Total Score  Total Score Depression Severity:  1-4 = Minimal depression, 5-9 = Mild depression, 10-14 = Moderate depression, 15-19 = Moderately severe depression, 20-27 = Severe depression   Psychosocial Evaluation and Intervention: Psychosocial Evaluation - 06/08/18 1642      Psychosocial Evaluation & Interventions   Interventions  Encouraged to exercise with the  program and follow exercise prescription    Comments  Pt interacts well with fellow participants and staff.  Pt offers good input during education classes.    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 Pt will graduate when able to return to rehab and plans to continue exercising in the pulmonary maintenance program    Expected Outcomes  patient will remain free from psychosocial barriers to participation in pulmonary rehab   patient will remain free from psychosocial barriers to participation in pulmonary rehab   patient will remain free from psychosocial barriers to participation in pulmonary rehab   patient will remain free from psychosocial barriers to participation in pulmonary rehab     Interventions  Encouraged to attend Pulmonary Rehabilitation for the exercise  Encouraged to attend Pulmonary Rehabilitation for the exercise  Encouraged to attend Pulmonary Rehabilitation for the exercise  Encouraged to attend Pulmonary Rehabilitation for the exercise    Continue Psychosocial Services   Follow up required by  staff  Follow up required by staff  Follow up required by staff  Follow up required by staff       Psychosocial Discharge (Final Psychosocial Re-Evaluation): Psychosocial Re-Evaluation - 06/08/18 1645      Psychosocial Re-Evaluation   Current issues with  None Identified    Comments  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 Pt will graduate when able to return to rehab and plans to continue exercising in the pulmonary maintenance program    Expected Outcomes  patient will remain free from psychosocial barriers to participation in pulmonary rehab     Interventions  Encouraged to attend Pulmonary Rehabilitation for the exercise    Continue Psychosocial Services   Follow up required by  staff       Education: Education Goals: Education classes will be provided on a weekly basis, covering required topics. Participant will state understanding/return demonstration of topics presented.  Learning Barriers/Preferences: Learning Barriers/Preferences - 01/12/18 1453      Learning Barriers/Preferences   Learning Barriers  None    Learning Preferences  Written Material;Group Instruction;Verbal Instruction;Skilled Demonstration       Education Topics: Risk Factor Reduction:  -Group instruction that is supported by a PowerPoint presentation. Instructor discusses the definition of a risk factor, different risk factors for pulmonary disease, and how the heart and lungs work together.     Nutrition for Pulmonary Patient:  -Group instruction provided by PowerPoint slides, verbal discussion, and written materials to support subject matter. The instructor gives an explanation and review of healthy diet recommendations, which includes a discussion on weight management, recommendations for fruit and vegetable consumption, as well as protein, fluid, caffeine, fiber, sodium, sugar, and alcohol. Tips for eating when patients  are short of breath are discussed.   PULMONARY REHAB CHRONIC OBSTRUCTIVE PULMONARY DISEASE from 05/28/2018 in [place]  Date  04/02/18  Educator  Parke Simmers  Instruction Review Code  2- Demonstrated Understanding      Pursed Lip Breathing:  -Group instruction that is supported by demonstration and informational handouts. Instructor discusses the benefits of pursed lip and diaphragmatic breathing and detailed demonstration on how to preform both.     Oxygen Safety:  -Group instruction provided by PowerPoint, verbal discussion, and written material to support subject matter. There is an overview of "What is Oxygen" and "Why do we need it".  Instructor also reviews how to create a safe environment for oxygen use, the importance of using oxygen as prescribed, and the risks of noncompliance. There is a brief discussion on traveling with oxygen and resources the patient may utilize.   PULMONARY REHAB CHRONIC OBSTRUCTIVE PULMONARY DISEASE from 05/28/2018 in [place]  Date  03/05/18  Educator  [place]  Instruction Review Code  2- Demonstrated Understanding      Oxygen Equipment:  -Group instruction provided by [facility] Staff utilizing handouts, written materials, and equipment demonstrations.   PULMONARY REHAB CHRONIC OBSTRUCTIVE PULMONARY DISEASE from 05/28/2018 in [place]  Date  03/19/18  Educator  [employer]  Instruction Review Code  2- Demonstrated Understanding      Signs and Symptoms:  -Group instruction provided by written material and verbal discussion to support subject matter. Warning signs and symptoms of infection, stroke, and heart attack are reviewed and when to call the physician/911 reinforced. Tips for preventing the spread of infection discussed.   PULMONARY REHAB CHRONIC OBSTRUCTIVE PULMONARY DISEASE from 05/28/2018 in [place]  Date  05/28/18   Educator  Remo Lipps  Instruction Review Code  1- Verbalizes Understanding      Advanced Directives:  -Group instruction provided by verbal instruction and written material to support subject matter. Instructor reviews Advanced Directive laws and proper instruction for filling out document.   Pulmonary Video:  -Group video education that reviews the importance of medication and oxygen compliance, exercise, good nutrition, pulmonary hygiene, and pursed lip and diaphragmatic breathing for the pulmonary patient.   Exercise for the Pulmonary Patient:  -Group instruction that is supported by a PowerPoint presentation. Instructor discusses benefits of exercise, core components of exercise, frequency, duration, and intensity of an exercise routine, importance of utilizing pulse oximetry during exercise, safety while  exercising, and options of places to exercise outside of rehab.     PULMONARY REHAB CHRONIC OBSTRUCTIVE PULMONARY DISEASE from 05/28/2018 in [place]  Date  03/26/18  Educator  EP  Instruction Review Code  1- Verbalizes Understanding      Pulmonary Medications:  -Verbally interactive group education provided by instructor with focus on inhaled medications and proper administration.   PULMONARY REHAB CHRONIC OBSTRUCTIVE PULMONARY DISEASE from 05/28/2018 in [place]  Date  02/12/18  Educator  Pharm  Instruction Review Code  2- Demonstrated Understanding      Anatomy and Physiology of the Respiratory System and Intimacy:  -Group instruction provided by PowerPoint, verbal discussion, and written material to support subject matter. Instructor reviews respiratory cycle and anatomical components of the respiratory system and their functions. Instructor also reviews differences in obstructive and restrictive respiratory diseases with examples of each. Intimacy, Sex, and Sexuality differences are reviewed with a discussion on  how relationships can change when diagnosed with pulmonary disease. Common sexual concerns are reviewed.   PULMONARY REHAB CHRONIC OBSTRUCTIVE PULMONARY DISEASE from 05/28/2018 in [place]  Date  04/16/18  Educator  rn  Instruction Review Code  2- Demonstrated Understanding      MD DAY -A group question and answer session with a medical doctor that allows participants to ask questions that relate to their pulmonary disease state.   PULMONARY REHAB CHRONIC OBSTRUCTIVE PULMONARY DISEASE from 05/28/2018 in [place]  Date  04/23/18  Educator  Dr. Nelda Marseille  Instruction Review Code  1- Verbalizes Understanding      OTHER EDUCATION -Group or individual verbal, written, or video instructions that support the educational goals of the pulmonary rehab program.   PULMONARY REHAB CHRONIC OBSTRUCTIVE PULMONARY DISEASE from 05/28/2018 in [place]  Date  05/14/18  Educator  [place]  Instruction Review Code  1- Verbalizes Understanding      Holiday Eating Survival Tips:  -Group instruction provided by PowerPoint slides, verbal discussion, and written materials to support subject matter. The instructor gives patients tips, tricks, and techniques to help them not only survive but enjoy the holidays despite the onslaught of food that accompanies the holidays.   Knowledge Questionnaire Score: Knowledge Questionnaire Score - 01/15/18 1746      Knowledge Questionnaire Score   Pre Score  13/18       Core Components/Risk Factors/Patient Goals at Admission: Personal Goals and Risk Factors at Admission - 01/12/18 1456      Core Components/Risk Factors/Patient Goals on Admission   Improve shortness of breath with ADL's  Yes    Intervention  Provide education, individualized exercise plan and daily activity instruction to help decrease symptoms of SOB with activities  of daily living.    Expected Outcomes  Short Term: Improve cardiorespiratory fitness to achieve a reduction of symptoms when performing ADLs;Long Term: Be able to perform more ADLs without symptoms or delay the onset of symptoms       Core Components/Risk Factors/Patient Goals Review:  Goals and Risk Factor Review    Row Name 02/23/18 1105 03/23/18 1302 04/20/18 1635 05/18/18 1441 06/08/18 1641     Core Components/Risk Factors/Patient Goals Review   Personal Goals Review  Improve shortness of breath with ADL's  Improve shortness of breath with ADL's;Develop more efficient breathing techniques such as purse lipped breathing and diaphragmatic breathing and practicing  self-pacing with activity.  Improve shortness of breath with ADL's;Develop more efficient breathing techniques such as purse lipped breathing and diaphragmatic breathing and practicing self-pacing with activity.  Improve shortness of breath with ADL's  Improve shortness of breath with ADL's   Review  patient got off to a slow start in pulmonary rehab related to her inability to stay oxygenated with the maximum amount of oxygen we could supply to her. in collaboration with her pulmonologist it was decided that she would not walk during rehab related to continued desaturations below adjusted parameters on maximum flow. patient does tolerated seated exercise and increased workloads on equiptment. she has not seen an improvement in her shortness of breath as of yet. will continue to monitor and hope to see progression towards improvement in shortness of breath over the next 30 days.  [name] is doing OK in pulmonary rehab. she was seen by our medical director during the last 30 days and it was identified that the patient was in need of a HF consult for her [place]. she subsiquently saw her primary pulmonologist who facilitated this referral and patient is scheduled to see Dr. Haroldine Laws in June. she remains on 100% NRB for exercise however if she is not  walking we maybe able to do a trial of O2 using a venti mask at 50%. During ambulation she continues to desaturate significantly unless she is on 25L however our current order is only for 15. will wait until patient has appointment with HF clinic for more guided direction. she states she enjoys PR and believes her shortness of breath has improved during her admission. she has had a minor incidences of epitaxis that has prohibited her from exercise. That has been resolved with a visit to the ENT and directions on nasal hydration and use of Afrin for bleeding. will continued to monitor towards pulmonary rehab goals during the next 30 days.  [name] is doing good in pulmonary rehab. she was seen by our medical director during the last 30 days and it was identified that the patient was in need of a HF consult for her [place]. she subsiquently saw her primary pulmonologist who facilitated this referral and patient saw Dr. Haroldine Laws in June. she remains on 100% NRB for exercise however if she is not walking we maybe able to do a trial of O2 using a venti mask at 50%. During ambulation she continues to desaturate significantly unless she is on 25L however our current order is only for 15. will wait until patient has appointment with HF clinic for more guided direction her appointment is scheduled for 6/24.  . she states she enjoys PR and believes her shortness of breath has improved during her admission.  [name] continues to help out at the [employer] and has great joy from this.  We will continue to monitor towards pulmonary rehab goals during the next 30 days.  Making slow progress albeit progress, still dealing with SOB, but able to walk 15 laps on the track in 30 minutes, and level 5 on the nustep.  Making slow progress albeit progress, still dealing with SOB, but able to walk 15 laps on the track in 30 minutes, and level 5 on the nustep. Requires high liter flow of oxygen to 15 liters.  Pt seen by Dr. Haroldine Laws who is not  convinced she has pulmonary HTN.   Expected Outcomes  see admission expected outcomes.  see admission expected outcomes.  see admission expected outcomes.  see admission expected outcomes.  see admission expected outcomes.   [place] Name 06/08/18 1643             Core Components/Risk Factors/Patient Goals Review   Review  Making slow progress but is quite pleased with herself.  Pt with recent fall/ LOC due to not turning her oxygen tank up while ambulating to her bedroom.  Pt will now be on a double tank to ensure this doesnt happen agina.  Pt still dealing with SOB, but able to walk 15 laps on the track in 30 minutes, and level 5 on the nustep. Requires high liter flow of oxygen to 15 liters.  Pt seen by Dr. Haroldine Laws who is not convinced she has pulmonary HTN.       Expected Outcomes  see admission expected outcomes.          Core Components/Risk Factors/Patient Goals at Discharge (Final Review):  Goals and Risk Factor Review - 06/08/18 1643      Core Components/Risk Factors/Patient Goals Review   Review  Making slow progress but is quite pleased with herself.  Pt with recent fall/ LOC due to not turning her oxygen tank up while ambulating to her bedroom.  Pt will now be on a double tank to ensure this doesnt happen agina.  Pt still dealing with SOB, but able to walk 15 laps on the track in 30 minutes, and level 5 on the nustep. Requires high liter flow of oxygen to 15 liters.  Pt seen by Dr. Haroldine Laws who is not convinced she has pulmonary HTN.    Expected Outcomes  see admission expected outcomes.       ITP Comments: ITP Comments    Row Name 03/23/18 1257 04/22/18 1051 06/08/18 1639       ITP Comments  Dr Jennet Maduro, Medical Director  Dr Jennet Maduro, Medical Director  Dr Jennet Maduro, Medical Director        Comments:  Pt has completed 32 exercise sessions. [name], BSN Cardiac and Training and development officer

## 2018-06-08 NOTE — Telephone Encounter (Signed)
Order was placed. Will close this message.

## 2018-06-08 NOTE — Telephone Encounter (Signed)
Okay to order if they have such a concentrator available. Also okay to order high flow nasal cannula

## 2018-06-08 NOTE — Telephone Encounter (Signed)
Patient's daughter called to see if they could get a order for regulator from [facility], that can be for 15 L . Has OV 06/10/18 with Beth, NP.  06/05/18 hospital D/C instructions- Maintain your O2 level between 88-94%.  Specifically, this should be done with 8L of oxygen at rest, and 15L of oxygen with ambulation.  Dr Elsworth Soho Please advise

## 2018-06-08 NOTE — Progress Notes (Signed)
Pulmonary Individual Treatment Plan  Patient Details  Name: [name] MRN: [id number] Date of Birth: Nov 03, 1929 Referring Provider:     Pulmonary Rehab Walk Test from 01/22/2018 in [place]  Referring Provider  Dr. Elsworth Soho      Initial Encounter Date:    Pulmonary Rehab Walk Test from 01/22/2018 in [place]  Date  01/22/18      Visit Diagnosis: Stage 2 moderate COPD by GOLD classification ([place])  Patient's Home Medications on Admission:   Current Outpatient Medications:  .  acetaminophen (TYLENOL) 500 MG tablet, Take 1,000 mg by mouth at bedtime. , Disp: , Rfl:  .  Aloe-Sodium Chloride (AYR SALINE NASAL GEL NA), Place 1 application into the nose daily as needed (dryness)., Disp: , Rfl:  .  ANORO ELLIPTA 62.5-25 MCG/INH AEPB, INHALE 1 PUFF INTO THE LUNGS DAILY, Disp: 60 each, Rfl: 5 .  aspirin 81 MG tablet, Take 81 mg by mouth 2 (two) times daily. , Disp: , Rfl:  .  Carboxymethylcellul-Glycerin (LUBRICATING EYE DROPS OP), Apply 1 drop to eye daily as needed (dry eyes)., Disp: , Rfl:  .  cephALEXin (KEFLEX) 500 MG capsule, Take 1 capsule (500 mg total) by mouth 2 (two) times daily for 3 days., Disp: 6 capsule, Rfl: 0 .  denosumab (PROLIA) 60 MG/ML SOLN injection, Inject 60 mg into the skin every 6 (six) months. Administer in upper arm, thigh, or abdomen, Disp: , Rfl:  .  furosemide (LASIX) 20 MG tablet, Take 1 tablet (20 mg total) by mouth daily., Disp: 90 tablet, Rfl: 1 .  isosorbide mononitrate (IMDUR) 30 MG 24 hr tablet, TAKE 1 TABLET(30 MG) BY MOUTH DAILY, Disp: 90 tablet, Rfl: 2 .  levothyroxine (SYNTHROID, LEVOTHROID) 137 MCG tablet, Take 137 mcg by mouth daily.  , Disp: , Rfl:  .  metoprolol tartrate (LOPRESSOR) 50 MG tablet, Take 1 tablet (50 mg total) by mouth 2 (two) times daily., Disp: 180 tablet, Rfl: 3 .  multivitamin-lutein (OCUVITE-LUTEIN) CAPS capsule, Take 1 capsule by mouth daily., Disp: , Rfl:  .   nitroGLYCERIN (NITROSTAT) 0.4 MG SL tablet, Place 1 tablet (0.4 mg total) under the tongue every 5 (five) minutes as needed for chest pain (MAX 3 doses)., Disp: 25 tablet, Rfl: 3 .  OXYGEN, Inhale 8 L into the lungs daily. , Disp: , Rfl:  .  Probiotic Product (PROBIOTIC DAILY PO), Take 1 tablet by mouth daily. , Disp: , Rfl:  .  rosuvastatin (CRESTOR) 40 MG tablet, Take 20 mg by mouth at bedtime. , Disp: , Rfl:   Past Medical History: Past Medical History:  Diagnosis Date  . Arthritis   . B12 deficiency   . Basal cell cancer    Nose  . Carotid artery occlusion   . Cataract   . Chronic diastolic CHF (congestive heart failure) ([place])   . Colon polyps   . COPD (chronic obstructive pulmonary disease) ([place])    STAGE 2  . Coronary artery disease    status post angioplasty of the mid RCA in 1995  . Dyspnea   . First degree atrioventricular block   . History of obesity   . Hyperlipidemia   . Hypertension    well controlled  . Hyperthyroidism   . Lichen sclerosus et atrophicus of the vulva   . Osteopenia   . PAD (peripheral artery disease) ([facility])    with stable claudication  . Pulmonary fibrosis ([facility])    wears 4L  home O2 at baseline    Tobacco Use: Social History   Tobacco Use  Smoking Status Former Smoker  . Packs/day: 1.00  . Years: 40.00  . Pack years: 40.00  . Types: Cigarettes  . Last attempt to quit: 11/18/1993  . Years since quitting: 24.5  Smokeless Tobacco Never Used    Labs: Recent Chemical engineer    Labs for ITP Cardiac and Pulmonary Rehab Latest Ref Rng & Units 05/27/2008 12/28/2016   Hemoglobin A1c 4.8 - 5.6 % - 5.0   TCO2 - 23 -      Capillary Blood Glucose: Lab Results  Component Value Date   GLUCAP 96 12/30/2016   GLUCAP 97 12/30/2016   GLUCAP 132 (H) 12/29/2016   GLUCAP 140 (H) 12/29/2016   GLUCAP 123 (H) 12/29/2016     Pulmonary Assessment Scores: Pulmonary Assessment Scores    Row Name 01/15/18 1746 01/23/18 0704       ADL UCSD   ADL  Phase  Entry  Entry    SOB Score total  49  -      CAT Score   CAT Score  20 Entry  -      mMRC Score   mMRC Score  -  0       Pulmonary Function Assessment: Pulmonary Function Assessment - 01/12/18 1453      Breath   Bilateral Breath Sounds  Clear    Shortness of Breath  Limiting activity;Yes       Exercise Target Goals:    Exercise Program Goal: Individual exercise prescription set using results from initial 6 min walk test and THRR while considering  patient's activity barriers and safety.    Exercise Prescription Goal: Initial exercise prescription builds to 30-45 minutes a day of aerobic activity, 2-3 days per week.  Home exercise guidelines will be given to patient during program as part of exercise prescription that the participant will acknowledge.  Activity Barriers & Risk Stratification: Activity Barriers & Cardiac Risk Stratification - 01/12/18 1436      Activity Barriers & Cardiac Risk Stratification   Activity Barriers  Shortness of Breath;Deconditioning       6 Minute Walk: 6 Minute Walk    Row Name 01/23/18 0700         6 Minute Walk   Phase  Initial     Distance  800 feet     Walk Time  - 5 minutes 10 seconds     # of Rest Breaks  1 stopped by ep due to low sat     MPH  1.51     METS  2.15     RPE  13     Perceived Dyspnea   2     Symptoms  Yes (comment)     Comments  used wheelchair and forehead probe-very slow o2 recovery     Resting HR  68 bpm     Resting BP  162/78     Resting Oxygen Saturation   91 %     Exercise Oxygen Saturation  during 6 min walk  78 %     Max Ex. HR  137 bpm     Max Ex. BP  180/64       Interval HR   1 Minute HR  100     2 Minute HR  118     3 Minute HR  130     4 Minute HR  132     5 Minute  HR  132     6 Minute HR  137     2 Minute Post HR  110     Interval Heart Rate?  Yes       Interval Oxygen   Interval Oxygen?  Yes     Baseline Oxygen Saturation %  91 %     1 Minute Oxygen Saturation %  95 %      1 Minute Liters of Oxygen  10 L     2 Minute Oxygen Saturation %  84 %     2 Minute Liters of Oxygen  10 L     3 Minute Oxygen Saturation %  81 %     3 Minute Liters of Oxygen  15 L     4 Minute Oxygen Saturation %  85 % rest break by EP     4 Minute Liters of Oxygen  15 L     5 Minute Oxygen Saturation %  85 %     5 Minute Liters of Oxygen  15 L     6 Minute Oxygen Saturation %  78 %     6 Minute Liters of Oxygen  15 L     2 Minute Post Oxygen Saturation %  82 % did not recover to 91% until 4 minutes post 99mt     2 Minute Post Liters of Oxygen  10 L        Oxygen Initial Assessment: Oxygen Initial Assessment - 01/23/18 0703      Initial 6 min Walk   Oxygen Used  Continuous;E-Tanks    Liters per minute  15      Program Oxygen Prescription   Program Oxygen Prescription  Continuous;E-Tanks    Liters per minute  -- 10-15    Comments  Will get guidlines from Dr. AElsworth Sohofor O2 Sat and liter flow       Oxygen Re-Evaluation: Oxygen Re-Evaluation    Row Name 01/26/18 1346 02/20/18 1045 03/23/18 1653 04/21/18 0744 05/15/18 1023     Program Oxygen Prescription   Program Oxygen Prescription  Continuous;E-Tanks  Continuous;E-Tanks  Continuous;E-Tanks  Continuous;E-Tanks  Continuous;E-Tanks   Liters per minute  -  15 NRB  15 NRB  15  25 25L with walking and 15L with seated   Comments  -  Patient needs to stay above 90%--will try 25 liters  -  Patient needs to stay above 90%--will try 25 liters  -     Home Oxygen   Home Oxygen Device  -  Portable Concentrator;Home Concentrator;E-Tanks  Portable Concentrator;Home Concentrator;E-Tanks  Portable Concentrator;Home Concentrator;E-Tanks  Portable Concentrator;Home Concentrator;E-Tanks   Sleep Oxygen Prescription  -  Continuous  Continuous  Continuous  Continuous   Liters per minute  -  '6  6  6  6   ' Home Exercise Oxygen Prescription  -  Continuous Exercise at home is not encouraged at this time.  None Not exercising at home due to high liter  flow needs  None  None   Liters per minute  -  15  -  15  -   Home at Rest Exercise Oxygen Prescription  -  Continuous  Continuous  Continuous  Continuous   Liters per minute  -  '6  6  6  6 ' 6-8   Compliance with Home Oxygen Use  -  Yes  Yes  Yes  No     Goals/Expected Outcomes   Short Term Goals  -  To learn and exhibit  compliance with exercise, home and travel O2 prescription;To learn and understand importance of monitoring SPO2 with pulse oximeter and demonstrate accurate use of the pulse oximeter.;To learn and understand importance of maintaining oxygen saturations>88%;To learn and demonstrate proper pursed lip breathing techniques or other breathing techniques.;To learn and demonstrate proper use of respiratory medications  To learn and exhibit compliance with exercise, home and travel O2 prescription;To learn and understand importance of monitoring SPO2 with pulse oximeter and demonstrate accurate use of the pulse oximeter.;To learn and understand importance of maintaining oxygen saturations>88%;To learn and demonstrate proper pursed lip breathing techniques or other breathing techniques.;To learn and demonstrate proper use of respiratory medications  To learn and exhibit compliance with exercise, home and travel O2 prescription;To learn and understand importance of monitoring SPO2 with pulse oximeter and demonstrate accurate use of the pulse oximeter.;To learn and understand importance of maintaining oxygen saturations>88%;To learn and demonstrate proper pursed lip breathing techniques or other breathing techniques.;To learn and demonstrate proper use of respiratory medications  To learn and exhibit compliance with exercise, home and travel O2 prescription;To learn and understand importance of monitoring SPO2 with pulse oximeter and demonstrate accurate use of the pulse oximeter.;To learn and understand importance of maintaining oxygen saturations>88%;To learn and demonstrate proper pursed lip  breathing techniques or other breathing techniques.;To learn and demonstrate proper use of respiratory medications   Long  Term Goals  -  Exhibits compliance with exercise, home and travel O2 prescription;Verbalizes importance of monitoring SPO2 with pulse oximeter and return demonstration;Maintenance of O2 saturations>88%;Exhibits proper breathing techniques, such as pursed lip breathing or other method taught during program session;Compliance with respiratory medication;Demonstrates proper use of MDI's  Exhibits compliance with exercise, home and travel O2 prescription;Verbalizes importance of monitoring SPO2 with pulse oximeter and return demonstration;Maintenance of O2 saturations>88%;Exhibits proper breathing techniques, such as pursed lip breathing or other method taught during program session;Compliance with respiratory medication;Demonstrates proper use of MDI's  Exhibits compliance with exercise, home and travel O2 prescription;Verbalizes importance of monitoring SPO2 with pulse oximeter and return demonstration;Maintenance of O2 saturations>88%;Exhibits proper breathing techniques, such as pursed lip breathing or other method taught during program session;Compliance with respiratory medication;Demonstrates proper use of MDI's  Exhibits compliance with exercise, home and travel O2 prescription;Verbalizes importance of monitoring SPO2 with pulse oximeter and return demonstration;Maintenance of O2 saturations>88%;Exhibits proper breathing techniques, such as pursed lip breathing or other method taught during program session;Compliance with respiratory medication;Demonstrates proper use of MDI's   Goals/Expected Outcomes  -  compliance  Patient to maintain compliance  Patient to maintain compliance  -   Row Name 06/08/18 0918             Program Oxygen Prescription   Program Oxygen Prescription  Continuous;E-Tanks       Liters per minute  25       Comments  Patient needs 25 liters with walking  15 seated         Home Oxygen   Home Oxygen Device  Portable Concentrator;Home Concentrator;E-Tanks       Sleep Oxygen Prescription  Continuous       Liters per minute  6       Home Exercise Oxygen Prescription  None Can not exercise at home since she has high liter flow.       Home at Rest Exercise Oxygen Prescription  Continuous       Liters per minute  6 6-8       Compliance with Home Oxygen Use  Yes  Goals/Expected Outcomes   Short Term Goals  To learn and exhibit compliance with exercise, home and travel O2 prescription;To learn and understand importance of monitoring SPO2 with pulse oximeter and demonstrate accurate use of the pulse oximeter.;To learn and understand importance of maintaining oxygen saturations>88%;To learn and demonstrate proper pursed lip breathing techniques or other breathing techniques.;To learn and demonstrate proper use of respiratory medications       Long  Term Goals  Exhibits compliance with exercise, home and travel O2 prescription;Verbalizes importance of monitoring SPO2 with pulse oximeter and return demonstration;Maintenance of O2 saturations>88%;Exhibits proper breathing techniques, such as pursed lip breathing or other method taught during program session;Compliance with respiratory medication;Demonstrates proper use of MDI's          Oxygen Discharge (Final Oxygen Re-Evaluation): Oxygen Re-Evaluation - 06/08/18 0918      Program Oxygen Prescription   Program Oxygen Prescription  Continuous;E-Tanks    Liters per minute  25    Comments  Patient needs 25 liters with walking 15 seated      Home Oxygen   Home Oxygen Device  Portable Concentrator;Home Concentrator;E-Tanks    Sleep Oxygen Prescription  Continuous    Liters per minute  6    Home Exercise Oxygen Prescription  None Can not exercise at home since she has high liter flow.    Home at Rest Exercise Oxygen Prescription  Continuous    Liters per minute  6 6-8    Compliance with Home  Oxygen Use  Yes      Goals/Expected Outcomes   Short Term Goals  To learn and exhibit compliance with exercise, home and travel O2 prescription;To learn and understand importance of monitoring SPO2 with pulse oximeter and demonstrate accurate use of the pulse oximeter.;To learn and understand importance of maintaining oxygen saturations>88%;To learn and demonstrate proper pursed lip breathing techniques or other breathing techniques.;To learn and demonstrate proper use of respiratory medications    Long  Term Goals  Exhibits compliance with exercise, home and travel O2 prescription;Verbalizes importance of monitoring SPO2 with pulse oximeter and return demonstration;Maintenance of O2 saturations>88%;Exhibits proper breathing techniques, such as pursed lip breathing or other method taught during program session;Compliance with respiratory medication;Demonstrates proper use of MDI's       Initial Exercise Prescription: Initial Exercise Prescription - 01/23/18 0700      Date of Initial Exercise RX and Referring Provider   Date  01/22/18    Referring Provider  Dr. Elsworth Soho      Oxygen   Oxygen  Continuous    Liters  -- 10-15      Recumbant Bike   Level  2    Watts  10    Minutes  17      NuStep   Level  2    SPM  80    Minutes  17    METs  1.5      Track   Laps  5    Minutes  17      Prescription Details   Frequency (times per week)  2    Duration  Progress to 45 minutes of aerobic exercise without signs/symptoms of physical distress      Intensity   THRR 40-80% of Max Heartrate  54-107    Ratings of Perceived Exertion  11-13    Perceived Dyspnea  0-4      Progression   Progression  Continue progressive overload as per policy without signs/symptoms or physical distress.  Resistance Training   Training Prescription  Yes    Weight  orange bands    Reps  10-15       Perform Capillary Blood Glucose checks as needed.  Exercise Prescription Changes:  Exercise  Prescription Changes    Row Name 02/05/18 1300 02/17/18 1231 03/03/18 1200 03/31/18 1200 04/14/18 1200     Response to Exercise   Blood Pressure (Admit)  144/52  122/58  136/62  124/64  124/74   Blood Pressure (Exercise)  178/72  146/50  138/70  144/58  164/64   Blood Pressure (Exit)  128/70  128/58  124/70  120/60  130/80   Heart Rate (Admit)  65 bpm  74 bpm  66 bpm  71 bpm  75 bpm   Heart Rate (Exercise)  93 bpm  92 bpm  87 bpm  103 bpm  104 bpm   Heart Rate (Exit)  68 bpm  81 bpm  61 bpm  99 bpm  67 bpm   Oxygen Saturation (Admit)  92 %  98 %  96 %  99 %  99 %   Oxygen Saturation (Exercise)  88 %  89 %  88 %  88 %  80 %   Oxygen Saturation (Exit)  96 %  100 %  97 %  100 %  97 %   Rating of Perceived Exertion (Exercise)  '15  13  12  13  11   ' Perceived Dyspnea (Exercise)  2  1  0  1  1   Duration  Progress to 45 minutes of aerobic exercise without signs/symptoms of physical distress  Progress to 45 minutes of aerobic exercise without signs/symptoms of physical distress  Progress to 45 minutes of aerobic exercise without signs/symptoms of physical distress  Progress to 45 minutes of aerobic exercise without signs/symptoms of physical distress  Progress to 45 minutes of aerobic exercise without signs/symptoms of physical distress   Intensity  Other (comment)  Other (comment)  Other (comment)  Other (comment)  Other (comment)     Progression   Progression  Continue to progress workloads to maintain intensity without signs/symptoms of physical distress. 40-80% HRR  Continue to progress workloads to maintain intensity without signs/symptoms of physical distress. 40-80% HRR  Continue to progress workloads to maintain intensity without signs/symptoms of physical distress. 40-80% HRR  Continue to progress workloads to maintain intensity without signs/symptoms of physical distress. 40-80% HRR  Continue to progress workloads to maintain intensity without signs/symptoms of physical distress. 40-80% HRR      Resistance Training   Training Prescription  Yes  Yes  Yes  Yes  Yes   Weight  orange bands  orange bands  orange bands  orange bands  orange bands   Reps  10-15  10-15  10-15  10-15  10-15   Time  10 Minutes  10 Minutes  10 Minutes  10 Minutes  10 Minutes     Oxygen   Oxygen  Continuous  Continuous  Continuous  Continuous  Continuous   Liters  15 L, 100% NRB  15 L, 100% NRB  15 L, 100% NRB  15 L, 100% NRB  15 L, 100% NRB     Recumbant Bike   Level  2.5  2.5  -  2  2   Watts  10  10  -  -  -   Minutes  17  17  -  17  17     NuStep   Level  '4  4  4  4  4   ' SPM  80  80  80  80  80   Minutes  17  34  34  34  34   METs  2.4  2.4  2.2  1.9  1.9   Row Name 04/28/18 1300 05/12/18 1200 05/26/18 1200 06/04/18 1124       Response to Exercise   Blood Pressure (Admit)  158/74  140/58  152/74  136/60    Blood Pressure (Exercise)  140/68  146/50  124/62  144/50    Blood Pressure (Exit)  123/60  102/56  110/56  126/70    Heart Rate (Admit)  73 bpm  70 bpm  86 bpm  74 bpm    Heart Rate (Exercise)  100 bpm  100 bpm  92 bpm  95 bpm    Heart Rate (Exit)  91 bpm  82 bpm  86 bpm  86 bpm    Oxygen Saturation (Admit)  97 %  98 %  97 %  97 %    Oxygen Saturation (Exercise)  85 %  89 %  88 %  86 %    Oxygen Saturation (Exit)  89 %  96 %  86 %  98 %    Rating of Perceived Exertion (Exercise)  '11  13  11  15    ' Perceived Dyspnea (Exercise)  1  0  2  3    Duration  Progress to 45 minutes of aerobic exercise without signs/symptoms of physical distress  Progress to 45 minutes of aerobic exercise without signs/symptoms of physical distress  Progress to 45 minutes of aerobic exercise without signs/symptoms of physical distress  Progress to 45 minutes of aerobic exercise without signs/symptoms of physical distress    Intensity  THRR unchanged  THRR unchanged  THRR unchanged  THRR unchanged      Progression   Progression  Continue to progress workloads to maintain intensity without signs/symptoms of physical  distress.  Continue to progress workloads to maintain intensity without signs/symptoms of physical distress.  Continue to progress workloads to maintain intensity without signs/symptoms of physical distress.  Continue to progress workloads to maintain intensity without signs/symptoms of physical distress.      Resistance Training   Training Prescription  Yes  Yes  Yes  Yes    Weight  orange bands  orange bands  orange bands  orange bands    Reps  10-15  10-15  10-15  10-15    Time  10 Minutes  10 Minutes  10 Minutes  10 Minutes      Interval Training   Interval Training  -  No  No  No      Oxygen   Oxygen  Continuous  Continuous  Continuous  Continuous    Liters  25L, 100% NRB  15-25 L, 100% NRB  15-25 L, 100% NRB  15-25 L, 100% NRB      NuStep   Level  '4  5  4  5    ' SPM  80  80  80  80    Minutes  '17  17  17  17    ' METs  2.1  2.4  2.5  2.2      Track   Laps  '22  16  16  7    ' Minutes  34  34  34  17       Exercise Comments:   Exercise Goals and Review:  Exercise Goals    Row Name 01/12/18 1437             Exercise Goals   Increase Physical Activity  Yes       Intervention  Provide advice, education, support and counseling about physical activity/exercise needs.;Develop an individualized exercise prescription for aerobic and resistive training based on initial evaluation findings, risk stratification, comorbidities and participant's personal goals.       Expected Outcomes  Short Term: Attend rehab on a regular basis to increase amount of physical activity.;Long Term: Add in home exercise to make exercise part of routine and to increase amount of physical activity.;Long Term: Exercising regularly at least 3-5 days a week.       Increase Strength and Stamina  Yes       Intervention  Provide advice, education, support and counseling about physical activity/exercise needs.;Develop an individualized exercise prescription for aerobic and resistive training based on initial  evaluation findings, risk stratification, comorbidities and participant's personal goals.       Expected Outcomes  Short Term: Increase workloads from initial exercise prescription for resistance, speed, and METs.;Short Term: Perform resistance training exercises routinely during rehab and add in resistance training at home;Long Term: Improve cardiorespiratory fitness, muscular endurance and strength as measured by increased METs and functional capacity (6MWT)       Able to understand and use rate of perceived exertion (RPE) scale  Yes       Intervention  Provide education and explanation on how to use RPE scale       Expected Outcomes  Short Term: Able to use RPE daily in rehab to express subjective intensity level;Long Term:  Able to use RPE to guide intensity level when exercising independently       Able to understand and use Dyspnea scale  Yes       Intervention  Provide education and explanation on how to use Dyspnea scale       Expected Outcomes  Short Term: Able to use Dyspnea scale daily in rehab to express subjective sense of shortness of breath during exertion;Long Term: Able to use Dyspnea scale to guide intensity level when exercising independently       Knowledge and understanding of Target Heart Rate Range (THRR)  Yes       Intervention  Provide education and explanation of THRR including how the numbers were predicted and where they are located for reference       Expected Outcomes  Short Term: Able to state/look up THRR;Short Term: Able to use daily as guideline for intensity in rehab;Long Term: Able to use THRR to govern intensity when exercising independently       Understanding of Exercise Prescription  Yes       Intervention  Provide education, explanation, and written materials on patient's individual exercise prescription       Expected Outcomes  Short Term: Able to explain program exercise prescription;Long Term: Able to explain home exercise prescription to exercise  independently          Exercise Goals Re-Evaluation : Exercise Goals Re-Evaluation    Row Name 01/26/18 1346 02/20/18 1055 03/23/18 1655 04/21/18 0744 05/15/18 1025     Exercise Goal Re-Evaluation   Exercise Goals Review  -  Increase Physical Activity;Able to understand and use rate of perceived exertion (RPE) scale;Knowledge and understanding of Target Heart Rate Range (THRR);Understanding of Exercise Prescription;Increase Strength and Stamina;Able to understand and use Dyspnea scale  Increase Physical Activity;Able to understand and  use rate of perceived exertion (RPE) scale;Knowledge and understanding of Target Heart Rate Range (THRR);Understanding of Exercise Prescription;Increase Strength and Stamina;Able to understand and use Dyspnea scale  Increase Physical Activity;Able to understand and use rate of perceived exertion (RPE) scale;Knowledge and understanding of Target Heart Rate Range (THRR);Understanding of Exercise Prescription;Increase Strength and Stamina;Able to understand and use Dyspnea scale  Increase Physical Activity;Able to understand and use rate of perceived exertion (RPE) scale;Knowledge and understanding of Target Heart Rate Range (THRR);Understanding of Exercise Prescription;Increase Strength and Stamina;Able to understand and use Dyspnea scale   Comments  Patient's first day of exercise will be 01/29/18  Patient is currently only doing seated exercise due to her desaturations. Will increase her to 25 liters with NRB next time she is here to see if this will allow her to walk on the track while keeping her o2 sats above 90%  Patient is currently only doing seated exercise due to her desaturations. Not exercising at home at this point due to high liter flow needs.  Patient is currently only doing seated exercise due to her desaturations. Patient is going to walk today on potentially 25 liters with NRB to see what her 02 sats are. Patient is eager to walk. Not exercising at home at this  point due to high liter flow needs.  [name] is now walking the track for 30 minutes on 25 liters. Tolerating well. Increased workloads on the NUSTEP-patient feels like she is not finally progressing. High liter flow is a barrier. Will cont. to monitor. Home exercise not appropriate at this time due to high liter flows.   Expected Outcomes  -  Through exercise at rehab and at home, patient will increase physical capacity and be able to carry out ADL's with ease at home. Patient will also gain the confidence and knowledge to adhere to an exercise regime at home.  Through exercise at rehab and at home, patient will increase physical capacity and be able to carry out ADL's with ease at home. Patient will also gain the confidence and knowledge to adhere to an exercise regime at home.  Through exercise at rehab and at home, the patient will decrease shortness of breath with daily activities and feel confident in carrying out an exercise regime at home.   Through exercise at rehab and at home, the patient will decrease shortness of breath with daily activities and feel confident in carrying out an exercise regime at home.    [place] Name 06/08/18 0920             Exercise Goal Re-Evaluation   Exercise Goals Review  Increase Physical Activity;Able to understand and use rate of perceived exertion (RPE) scale;Knowledge and understanding of Target Heart Rate Range (THRR);Understanding of Exercise Prescription;Increase Strength and Stamina;Able to understand and use Dyspnea scale       Comments  [name] is now walking the track for 30 minutes on 25 liters. Tolerating well. Increased workloads on the NUSTEP-patient feels like she is finally progressing. High liter flow is a barrier. Will cont. to monitor. Home exercise not appropriate at this time due to high liter flows.       Expected Outcomes  Through exercise at rehab and at home, the patient will decrease shortness of breath with daily activities and feel confident in  carrying out an exercise regime at home.           Discharge Exercise Prescription (Final Exercise Prescription Changes): Exercise Prescription Changes - 06/04/18 1124  Response to Exercise   Blood Pressure (Admit)  136/60    Blood Pressure (Exercise)  144/50    Blood Pressure (Exit)  126/70    Heart Rate (Admit)  74 bpm    Heart Rate (Exercise)  95 bpm    Heart Rate (Exit)  86 bpm    Oxygen Saturation (Admit)  97 %    Oxygen Saturation (Exercise)  86 %    Oxygen Saturation (Exit)  98 %    Rating of Perceived Exertion (Exercise)  15    Perceived Dyspnea (Exercise)  3    Duration  Progress to 45 minutes of aerobic exercise without signs/symptoms of physical distress    Intensity  THRR unchanged      Progression   Progression  Continue to progress workloads to maintain intensity without signs/symptoms of physical distress.      Resistance Training   Training Prescription  Yes    Weight  orange bands    Reps  10-15    Time  10 Minutes      Interval Training   Interval Training  No      Oxygen   Oxygen  Continuous    Liters  15-25 L, 100% NRB      NuStep   Level  5    SPM  80    Minutes  17    METs  2.2      Track   Laps  7    Minutes  17       Nutrition:  Target Goals: Understanding of nutrition guidelines, daily intake of sodium <1567m, cholesterol <2045m calories 30% from fat and 7% or less from saturated fats, daily to have 5 or more servings of fruits and vegetables.  Biometrics: Pre Biometrics - 01/12/18 1459      Pre Biometrics   Grip Strength  30 kg        Nutrition Therapy Plan and Nutrition Goals: Nutrition Therapy & Goals - 02/12/18 1224      Nutrition Therapy   Diet  Heart Healthy      Personal Nutrition Goals   Nutrition Goal  Describe the benefit of including fruits, vegetables, whole grains, and low-fat dairy products in a healthy meal plan.      Intervention Plan   Intervention  Prescribe, educate and counsel regarding  individualized specific dietary modifications aiming towards targeted core components such as weight, hypertension, lipid management, diabetes, heart failure and other comorbidities.    Expected Outcomes  Short Term Goal: Understand basic principles of dietary content, such as calories, fat, sodium, cholesterol and nutrients.;Long Term Goal: Adherence to prescribed nutrition plan.       Nutrition Assessments: Nutrition Assessments - 02/12/18 1224      Rate Your Plate Scores   Pre Score  58       Nutrition Goals Re-Evaluation:   Nutrition Goals Discharge (Final Nutrition Goals Re-Evaluation):   Psychosocial: Target Goals: Acknowledge presence or absence of significant depression and/or stress, maximize coping skills, provide positive support system. Participant is able to verbalize types and ability to use techniques and skills needed for reducing stress and depression.  Initial Review & Psychosocial Screening: Initial Psych Review & Screening - 01/12/18 1456      Initial Review   Current issues with  None Identified      Family Dynamics   Good Support System?  Yes      Barriers   Psychosocial barriers to participate in program  There are no identifiable  barriers or psychosocial needs.      Screening Interventions   Interventions  Encouraged to exercise       Quality of Life Scores:  Scores of 19 and below usually indicate a poorer quality of life in these areas.  A difference of  2-3 points is a clinically meaningful difference.  A difference of 2-3 points in the total score of the Quality of Life Index has been associated with significant improvement in overall quality of life, self-image, physical symptoms, and general health in studies assessing change in quality of life.   PHQ-9: Recent Review Flowsheet Data    Depression screen [facility] 2/9 01/12/2018   Decreased Interest 0   Down, Depressed, Hopeless 0   PHQ - 2 Score 0     Interpretation of Total Score  Total  Score Depression Severity:  1-4 = Minimal depression, 5-9 = Mild depression, 10-14 = Moderate depression, 15-19 = Moderately severe depression, 20-27 = Severe depression   Psychosocial Evaluation and Intervention: Psychosocial Evaluation - 06/08/18 1642      Psychosocial Evaluation & Interventions   Interventions  Encouraged to exercise with the program and follow exercise prescription    Comments  Pt interacts well with fellow participants and staff.  Pt offers good input during education classes.    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 Pt will graduate when able to return to rehab and plans to continue exercising in the pulmonary maintenance program    Expected Outcomes  patient will remain free from psychosocial barriers to participation in pulmonary rehab   patient will remain free from psychosocial barriers to participation in pulmonary rehab   patient will remain free from psychosocial barriers to participation in pulmonary rehab   patient will remain free from psychosocial barriers to participation in  pulmonary rehab     Interventions  Encouraged to attend Pulmonary Rehabilitation for the exercise  Encouraged to attend Pulmonary Rehabilitation for the exercise  Encouraged to attend Pulmonary Rehabilitation for the exercise  Encouraged to attend Pulmonary Rehabilitation for the exercise    Continue Psychosocial Services   Follow up required by staff  Follow up required by staff  Follow up required by staff  Follow up required by staff       Psychosocial Discharge (Final Psychosocial Re-Evaluation): Psychosocial Re-Evaluation - 06/08/18 1645      Psychosocial Re-Evaluation   Current issues with  None Identified    Comments  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 Pt will graduate when able  to return to rehab and plans to continue exercising in the pulmonary maintenance program    Expected Outcomes  patient will remain free from psychosocial barriers to participation in pulmonary rehab     Interventions  Encouraged to attend Pulmonary Rehabilitation for the exercise    Continue Psychosocial Services   Follow up required by staff       Education: Education Goals: Education classes will be provided on a weekly basis, covering required topics. Participant will state understanding/return demonstration of topics presented.  Learning Barriers/Preferences: Learning Barriers/Preferences - 01/12/18 1453      Learning Barriers/Preferences   Learning Barriers  None    Learning Preferences  Written Material;Group Instruction;Verbal Instruction;Skilled Demonstration       Education Topics: Risk Factor Reduction:  -Group instruction that is supported by a PowerPoint presentation. Instructor discusses the definition of a risk factor, different risk factors for pulmonary disease, and how the heart and lungs work together.     Nutrition for Pulmonary Patient:  -Group instruction provided by PowerPoint slides,  verbal discussion, and written materials to support subject matter. The instructor gives an explanation and review of healthy diet recommendations, which includes a discussion on weight management, recommendations for fruit and vegetable consumption, as well as protein, fluid, caffeine, fiber, sodium, sugar, and alcohol. Tips for eating when patients are short of breath are discussed.   PULMONARY REHAB CHRONIC OBSTRUCTIVE PULMONARY DISEASE from 05/28/2018 in [place]  Date  04/02/18  Educator  Parke Simmers  Instruction Review Code  2- Demonstrated Understanding      Pursed Lip Breathing:  -Group instruction that is supported by demonstration and informational handouts. Instructor discusses the benefits of pursed lip and diaphragmatic breathing and detailed demonstration on how to preform both.     Oxygen Safety:  -Group instruction provided by PowerPoint, verbal discussion, and written material to support subject matter. There is an overview of "What is Oxygen" and "Why do we need it".  Instructor also reviews how to create a safe environment for oxygen use, the importance of using oxygen as prescribed, and the risks of noncompliance. There is a brief discussion on traveling with oxygen and resources the patient may utilize.   PULMONARY REHAB CHRONIC OBSTRUCTIVE PULMONARY DISEASE from 05/28/2018 in [place]  Date  03/05/18  Educator  [place]  Instruction Review Code  2- Demonstrated Understanding      Oxygen Equipment:  -Group instruction provided by [facility] Staff utilizing handouts, written materials, and equipment demonstrations.   PULMONARY REHAB CHRONIC OBSTRUCTIVE PULMONARY DISEASE from 05/28/2018 in [place]  Date  03/19/18  Educator  [place]  Instruction Review Code  2- Demonstrated Understanding      Signs and Symptoms:  -Group instruction provided by written material and verbal  discussion to support subject matter. Warning signs and symptoms of infection, stroke, and heart attack are reviewed and when to call the physician/911 reinforced. Tips for preventing the spread of infection discussed.   PULMONARY REHAB CHRONIC OBSTRUCTIVE PULMONARY DISEASE from 05/28/2018 in [place]  Date  05/28/18  Educator  Remo Lipps  Instruction Review Code  1- Verbalizes Understanding      Advanced Directives:  -Group instruction provided by verbal instruction and written material to support subject matter. Instructor reviews Advanced Directive laws and proper instruction for filling out document.   Pulmonary Video:  -Group video education that reviews the importance of medication and oxygen compliance,  exercise, good nutrition, pulmonary hygiene, and pursed lip and diaphragmatic breathing for the pulmonary patient.   Exercise for the Pulmonary Patient:  -Group instruction that is supported by a PowerPoint presentation. Instructor discusses benefits of exercise, core components of exercise, frequency, duration, and intensity of an exercise routine, importance of utilizing pulse oximetry during exercise, safety while exercising, and options of places to exercise outside of rehab.     PULMONARY REHAB CHRONIC OBSTRUCTIVE PULMONARY DISEASE from 05/28/2018 in [place]  Date  03/26/18  Educator  EP  Instruction Review Code  1- Verbalizes Understanding      Pulmonary Medications:  -Verbally interactive group education provided by instructor with focus on inhaled medications and proper administration.   PULMONARY REHAB CHRONIC OBSTRUCTIVE PULMONARY DISEASE from 05/28/2018 in [place]  Date  02/12/18  Educator  Pharm  Instruction Review Code  2- Demonstrated Understanding      Anatomy and Physiology of the Respiratory System and Intimacy:  -Group instruction provided by PowerPoint, verbal  discussion, and written material to support subject matter. Instructor reviews respiratory cycle and anatomical components of the respiratory system and their functions. Instructor also reviews differences in obstructive and restrictive respiratory diseases with examples of each. Intimacy, Sex, and Sexuality differences are reviewed with a discussion on how relationships can change when diagnosed with pulmonary disease. Common sexual concerns are reviewed.   PULMONARY REHAB CHRONIC OBSTRUCTIVE PULMONARY DISEASE from 05/28/2018 in [place]  Date  04/16/18  Educator  rn  Instruction Review Code  2- Demonstrated Understanding      MD DAY -A group question and answer session with a medical doctor that allows participants to ask questions that relate to their pulmonary disease state.   PULMONARY REHAB CHRONIC OBSTRUCTIVE PULMONARY DISEASE from 05/28/2018 in [place]  Date  04/23/18  Educator  Dr. Nelda Marseille  Instruction Review Code  1- Verbalizes Understanding      OTHER EDUCATION -Group or individual verbal, written, or video instructions that support the educational goals of the pulmonary rehab program.   PULMONARY REHAB CHRONIC OBSTRUCTIVE PULMONARY DISEASE from 05/28/2018 in [place]  Date  05/14/18  Educator  [place]  Instruction Review Code  1- Verbalizes Understanding      Holiday Eating Survival Tips:  -Group instruction provided by PowerPoint slides, verbal discussion, and written materials to support subject matter. The instructor gives patients tips, tricks, and techniques to help them not only survive but enjoy the holidays despite the onslaught of food that accompanies the holidays.   Knowledge Questionnaire Score: Knowledge Questionnaire Score - 01/15/18 1746      Knowledge Questionnaire Score   Pre Score  13/18       Core  Components/Risk Factors/Patient Goals at Admission: Personal Goals and Risk Factors at Admission - 01/12/18 1456      Core Components/Risk Factors/Patient Goals on Admission   Improve shortness of breath with ADL's  Yes    Intervention  Provide education, individualized exercise plan and daily activity instruction to help decrease symptoms of SOB with activities of daily living.    Expected Outcomes  Short Term: Improve cardiorespiratory fitness to achieve a reduction of symptoms when performing ADLs;Long Term: Be able to perform more ADLs without symptoms or delay the onset of symptoms       Core Components/Risk Factors/Patient Goals Review:  Goals and Risk Factor  Review    Row Name 02/23/18 1105 03/23/18 1302 04/20/18 1635 05/18/18 1441 06/08/18 1641     Core Components/Risk Factors/Patient Goals Review   Personal Goals Review  Improve shortness of breath with ADL's  Improve shortness of breath with ADL's;Develop more efficient breathing techniques such as purse lipped breathing and diaphragmatic breathing and practicing self-pacing with activity.  Improve shortness of breath with ADL's;Develop more efficient breathing techniques such as purse lipped breathing and diaphragmatic breathing and practicing self-pacing with activity.  Improve shortness of breath with ADL's  Improve shortness of breath with ADL's   Review  patient got off to a slow start in pulmonary rehab related to her inability to stay oxygenated with the maximum amount of oxygen we could supply to her. in collaboration with her pulmonologist it was decided that she would not walk during rehab related to continued desaturations below adjusted parameters on maximum flow. patient does tolerated seated exercise and increased workloads on equiptment. she has not seen an improvement in her shortness of breath as of yet. will continue to monitor and hope to see progression towards improvement in shortness of breath over the next 30 days.   [name] is doing OK in pulmonary rehab. she was seen by our medical director during the last 30 days and it was identified that the patient was in need of a HF consult for her [place]. she subsiquently saw her primary pulmonologist who facilitated this referral and patient is scheduled to see Dr. Haroldine Laws in June. she remains on 100% NRB for exercise however if she is not walking we maybe able to do a trial of O2 using a venti mask at 50%. During ambulation she continues to desaturate significantly unless she is on 25L however our current order is only for 15. will wait until patient has appointment with HF clinic for more guided direction. she states she enjoys PR and believes her shortness of breath has improved during her admission. she has had a minor incidences of epitaxis that has prohibited her from exercise. That has been resolved with a visit to the ENT and directions on nasal hydration and use of Afrin for bleeding. will continued to monitor towards pulmonary rehab goals during the next 30 days.  [name] is doing good in pulmonary rehab. she was seen by our medical director during the last 30 days and it was identified that the patient was in need of a HF consult for her [place]. she subsiquently saw her primary pulmonologist who facilitated this referral and patient saw Dr. Haroldine Laws in June. she remains on 100% NRB for exercise however if she is not walking we maybe able to do a trial of O2 using a venti mask at 50%. During ambulation she continues to desaturate significantly unless she is on 25L however our current order is only for 15. will wait until patient has appointment with HF clinic for more guided direction her appointment is scheduled for 6/24.  . she states she enjoys PR and believes her shortness of breath has improved during her admission.  [name] continues to help out at the [employer] and has great joy from this.  We will continue to monitor towards pulmonary rehab goals during the next 30 days.   Making slow progress albeit progress, still dealing with SOB, but able to walk 15 laps on the track in 30 minutes, and level 5 on the nustep.  Making slow progress albeit progress, still dealing with SOB, but able to walk 15 laps on the  track in 30 minutes, and level 5 on the nustep. Requires high liter flow of oxygen to 15 liters.  Pt seen by Dr. Haroldine Laws who is not convinced she has pulmonary HTN.   Expected Outcomes  see admission expected outcomes.  see admission expected outcomes.  see admission expected outcomes.  see admission expected outcomes.  see admission expected outcomes.   [place] Name 06/08/18 1643             Core Components/Risk Factors/Patient Goals Review   Review  Making slow progress but is quite pleased with herself.  Pt with recent fall/ LOC due to not turning her oxygen tank up while ambulating to her bedroom.  Pt will now be on a double tank to ensure this doesnt happen agina.  Pt still dealing with SOB, but able to walk 15 laps on the track in 30 minutes, and level 5 on the nustep. Requires high liter flow of oxygen to 15 liters.  Pt seen by Dr. Haroldine Laws who is not convinced she has pulmonary HTN.       Expected Outcomes  see admission expected outcomes.          Core Components/Risk Factors/Patient Goals at Discharge (Final Review):  Goals and Risk Factor Review - 06/08/18 1643      Core Components/Risk Factors/Patient Goals Review   Review  Making slow progress but is quite pleased with herself.  Pt with recent fall/ LOC due to not turning her oxygen tank up while ambulating to her bedroom.  Pt will now be on a double tank to ensure this doesnt happen agina.  Pt still dealing with SOB, but able to walk 15 laps on the track in 30 minutes, and level 5 on the nustep. Requires high liter flow of oxygen to 15 liters.  Pt seen by Dr. Haroldine Laws who is not convinced she has pulmonary HTN.    Expected Outcomes  see admission expected outcomes.       ITP Comments: ITP  Comments    Row Name 03/23/18 1257 04/22/18 1051 06/08/18 1639       ITP Comments  Dr Jennet Maduro, Medical Director  Dr Jennet Maduro, Medical Director  Dr Jennet Maduro, Medical Director        Comments:  Pt has completed 32 exercise sessions. [name], BSN Cardiac and Training and development officer

## 2018-06-08 NOTE — Telephone Encounter (Signed)
RA- please advise if you are okay with ordering maintenance rehab for pt  Thanks

## 2018-06-08 NOTE — Telephone Encounter (Signed)
Okay to order?

## 2018-06-09 ENCOUNTER — Encounter (HOSPITAL_COMMUNITY)
Admission: RE | Admit: 2018-06-09 | Discharge: 2018-06-09 | Disposition: A | Discharge status: Home / Self Care | Payer: Medicare Other | Source: Ambulatory Visit

## 2018-06-10 ENCOUNTER — Telehealth: Payer: Self-pay | Admitting: Primary Care

## 2018-06-10 ENCOUNTER — Encounter: Payer: Self-pay | Admitting: Primary Care

## 2018-06-10 ENCOUNTER — Other Ambulatory Visit (INDEPENDENT_AMBULATORY_CARE_PROVIDER_SITE_OTHER): Payer: Medicare Other

## 2018-06-10 ENCOUNTER — Ambulatory Visit (INDEPENDENT_AMBULATORY_CARE_PROVIDER_SITE_OTHER): Payer: Medicare Other | Admitting: Primary Care

## 2018-06-10 VITALS — BP 124/76 | HR 70 | Ht 63.0 in | Wt 149.8 lb

## 2018-06-10 DIAGNOSIS — I5032 Chronic diastolic (congestive) heart failure: Secondary | ICD-10-CM

## 2018-06-10 DIAGNOSIS — I272 Pulmonary hypertension, unspecified: Secondary | ICD-10-CM | POA: Diagnosis not present

## 2018-06-10 DIAGNOSIS — J449 Chronic obstructive pulmonary disease, unspecified: Secondary | ICD-10-CM

## 2018-06-10 DIAGNOSIS — J9611 Chronic respiratory failure with hypoxia: Secondary | ICD-10-CM

## 2018-06-10 DIAGNOSIS — R55 Syncope and collapse: Secondary | ICD-10-CM | POA: Diagnosis not present

## 2018-06-10 LAB — BASIC METABOLIC PANEL
BUN: 25 mg/dL — ABNORMAL HIGH (ref 6–23)
CALCIUM: 9.2 mg/dL (ref 8.4–10.5)
CO2: 31 mEq/L (ref 19–32)
Chloride: 106 mEq/L (ref 96–112)
Creatinine, Ser: 1.05 mg/dL (ref 0.40–1.20)
GFR: 52.7 mL/min — AB (ref >60.00)
GLUCOSE: 104 mg/dL — AB (ref 70–99)
Potassium: 3.9 mEq/L (ref 3.5–5.1)
SODIUM: 142 meq/L (ref 135–145)

## 2018-06-10 LAB — CULTURE, BLOOD (ROUTINE X 2)
CULTURE: NO GROWTH
Culture: NO GROWTH
SPECIAL REQUESTS: ADEQUATE

## 2018-06-10 LAB — BRAIN NATRIURETIC PEPTIDE: Pro B Natriuretic peptide (BNP): 1026 pg/mL — ABNORMAL HIGH (ref 0.0–100.0)

## 2018-06-10 NOTE — Assessment & Plan Note (Addendum)
-  Seen by Dr. Haroldine Laws in June 2019, not felt to have [facility]

## 2018-06-10 NOTE — Patient Instructions (Signed)
Next apt with Dr. Elsworth Soho- 8/21 at [place]  Will set up for E-tanks (0-15L)  Wear 8L oxygen at rest, 15L with activity  Continue pulmonary rehab, recommend maintenance program like planned   [facility] - patient needs electronic scooter

## 2018-06-10 NOTE — Progress Notes (Signed)
@Patient  ID: [name], female    DOB: November 12, 1929, 84 y.o.   MRN: [id number]  Chief Complaint  Patient presents with  . Follow-up    hospital follow up, Breathing better since hospital, has had problems with [facility]/home O2, using 10L [place], needs large regulator,    Referring provider: [name], MD  HPI: 84 year old female, former smoker(43 years). Pt of Dr. Elsworth Soho, last seen 5/20. Hx COPD on high flow home 02, polycythemia, diastolic CHF, CAD s/p PCI. Seen by Dr. Haroldine Laws in June 2019, not felt to have [place].   Recent [place] stay 7/19-7/21 for syncopal episode and fall, thought to be due to hypoxia. Patient had been watching TV, walked to bed and turned oxygen tank down. She continued to walk and then reports passing out onto the floor, she has had similar experiences in the past when using lower levels of oxygen. Patient was noted to be hypoxic in the ED, sat 80%. CT scan of the brain showed no acute abnormalities. Chest x-ray showed stable cardiomegaly without signs of edema. BNP elevated and given IV lasix. Pulmonary felt exam and findings did not suggest active pulmonary disease. It was felt that ultimately the patient was using less oxygen then recommended which contributed to syncopal episode. Patient was reportedly using 5L at rest & 8L on exertion (when in reality she should have been using 8L at rest & 15L with exertion). Patient needs case management to help set up oxygen tanks that could provide 15L. Echo 3/19 LVEF 60-65% , grade 1 dysfunction, no arrhthymias noted. Cardiology did not feel syncopal episode was cardiogenic. Also found to have UTI, tx with abx. BC negative.   06/10/2018 Presents today for hosp follow-up with daughter. Feels ok, breathing has improved. Continues high flow oxygen and Anoro. Finished abx for UTI. Taking 20mg  lasix daily. She uses pad for inc, discussed increasing frequency of changing. Sustained large bilateral facial ecchymosis and right forehead  hematoma from fall. Bruising has improved in color. No change in vision, wearing glasses. She needs E-tanks for 0-15L oxygen use. Ambulating with cane and oxygen tank. Its difficult for her to move around with large tanks. When using 10-15L oxygen often does not last long enough for her to enjoy some of the ADL that she wants to do. Discussed possible scooter use. Has follow-up visit with PCP next week. Last pulm rehab apt on August 6th, want to [facility] with [facility] program. Next apt with Dr. Elsworth Soho August 21st.   Allergies  Allergen Reactions  . Tape Other (See Comments)    Pulls skin    Immunization History  Administered Date(s) Administered  . Influenza, High Dose Seasonal PF 08/18/2016, 08/08/2017  . Influenza-Unspecified 08/16/2015  . Tdap 01/27/2018    Past Medical History:  Diagnosis Date  . Arthritis   . B12 deficiency   . Basal cell cancer    Nose  . Carotid artery occlusion   . Cataract   . Chronic diastolic CHF (congestive heart failure) ([place])   . Colon polyps   . COPD (chronic obstructive pulmonary disease) ([place])    STAGE 2  . Coronary artery disease    status post angioplasty of the mid RCA in 1995  . Dyspnea   . First degree atrioventricular block   . History of obesity   . Hyperlipidemia   . Hypertension    well controlled  . Hyperthyroidism   . Lichen sclerosus et atrophicus of the vulva   . Osteopenia   .  PAD (peripheral artery disease) (HCC)    with stable claudication  . Pulmonary fibrosis (HCC)    wears 4L home O2 at baseline    Tobacco History: Social History   Tobacco Use  Smoking Status Former Smoker  . Packs/day: 1.00  . Years: 40.00  . Pack years: 40.00  . Types: Cigarettes  . Last attempt to quit: 11/18/1993  . Years since quitting: 24.5  Smokeless Tobacco Never Used   Counseling given: Not Answered   Outpatient Medications Prior to Visit  Medication Sig Dispense Refill  . acetaminophen (TYLENOL) 500 MG tablet Take 1,000 mg by  mouth at bedtime.     . Aloe-Sodium Chloride (AYR SALINE NASAL GEL NA) Place 1 application into the nose daily as needed (dryness).    Jearl Klinefelter ELLIPTA 62.5-25 MCG/INH AEPB INHALE 1 PUFF INTO THE LUNGS DAILY 60 each 5  . aspirin 81 MG tablet Take 81 mg by mouth 2 (two) times daily.     . Carboxymethylcellul-Glycerin (LUBRICATING EYE DROPS OP) Apply 1 drop to eye daily as needed (dry eyes).    . cephALEXin (KEFLEX) 500 MG capsule Take 1 capsule (500 mg total) by mouth 2 (two) times daily for 3 days. 6 capsule 0  . denosumab (PROLIA) 60 MG/ML SOLN injection Inject 60 mg into the skin every 6 (six) months. Administer in upper arm, thigh, or abdomen    . furosemide (LASIX) 20 MG tablet Take 1 tablet (20 mg total) by mouth daily. 90 tablet 1  . isosorbide mononitrate (IMDUR) 30 MG 24 hr tablet TAKE 1 TABLET(30 MG) BY MOUTH DAILY 90 tablet 2  . levothyroxine (SYNTHROID, LEVOTHROID) 137 MCG tablet Take 137 mcg by mouth daily.      . metoprolol tartrate (LOPRESSOR) 50 MG tablet Take 1 tablet (50 mg total) by mouth 2 (two) times daily. 180 tablet 3  . multivitamin-lutein (OCUVITE-LUTEIN) CAPS capsule Take 1 capsule by mouth daily.    . nitroGLYCERIN (NITROSTAT) 0.4 MG SL tablet Place 1 tablet (0.4 mg total) under the tongue every 5 (five) minutes as needed for chest pain (MAX 3 doses). 25 tablet 3  . OXYGEN Inhale 8 L into the lungs daily.     . Probiotic Product (PROBIOTIC DAILY PO) Take 1 tablet by mouth daily.     . rosuvastatin (CRESTOR) 40 MG tablet Take 20 mg by mouth at bedtime.      No facility-administered medications prior to visit.       Review of Systems  Review of Systems  Constitutional: Negative.   HENT: Negative.   Eyes: Negative.   Respiratory: Positive for cough. Negative for chest tightness, shortness of breath and wheezing.   Cardiovascular: Negative.   Genitourinary:       Incontinence of urine (not new)  Musculoskeletal: Negative.   Neurological: Negative.     Psychiatric/Behavioral: Negative.      Physical Exam  BP 124/76 (BP Location: Right Arm, Cuff Size: Normal)   Pulse 70   Ht 5\' 3"  (1.6 m)   Wt 149 lb 12.8 oz (67.9 kg)   SpO2 90%   BMI 26.54 kg/m  Physical Exam  Constitutional: She is oriented to person, place, and time. She appears well-developed and well-nourished. No distress.  Well developed elderly woman. No distress.   HENT:  Head: Head is with contusion.    Eyes: Pupils are equal, round, and reactive to light. EOM are normal.  Neck: Normal range of motion. Neck supple.  Cardiovascular: Normal rate and regular  rhythm.  Pulmonary/Chest: Effort normal and breath sounds normal.  Abdominal: Soft.  Musculoskeletal: Normal range of motion.  Amb with cane  Neurological: She is alert and oriented to person, place, and time.  Skin: Skin is warm and dry.  Large facial bruising (see HENT)  Psychiatric: She has a normal mood and affect. Her behavior is normal. Judgment and thought content normal.     Lab Results:  CBC    Component Value Date/Time   WBC 7.5 06/06/2018 0511   RBC 4.98 06/06/2018 0511   HGB 12.5 06/06/2018 0511   HGB 13.8 11/21/2016 0915   HCT 40.4 06/06/2018 0511   HCT 41.6 11/21/2016 0915   PLT 160 06/06/2018 0511   PLT 217 11/21/2016 0915   MCV 81.1 06/06/2018 0511   MCV 86.6 11/21/2016 0915   MCH 25.1 (L) 06/06/2018 0511   MCHC 30.9 06/06/2018 0511   RDW 14.8 06/06/2018 0511   RDW 14.5 11/21/2016 0915   LYMPHSABS 1.1 06/05/2018 0053   LYMPHSABS 1.0 11/21/2016 0915   MONOABS 0.6 06/05/2018 0053   MONOABS 0.7 11/21/2016 0915   EOSABS 0.0 06/05/2018 0053   EOSABS 0.0 11/21/2016 0915   BASOSABS 0.0 06/05/2018 0053   BASOSABS 0.1 11/21/2016 0915    BMET    Component Value Date/Time   NA 140 06/07/2018 0525   NA 139 07/05/2016 1152   K 3.6 06/07/2018 0525   K 4.5 07/05/2016 1152   CL 107 06/07/2018 0525   CO2 26 06/07/2018 0525   CO2 24 07/05/2016 1152   GLUCOSE 105 (H) 06/07/2018 0525    GLUCOSE 101 07/05/2016 1152   BUN 21 06/07/2018 0525   BUN 22.4 07/05/2016 1152   CREATININE 1.06 (H) 06/07/2018 0525   CREATININE 1.0 07/05/2016 1152   CALCIUM 8.9 06/07/2018 0525   CALCIUM 10.0 07/05/2016 1152   GFRNONAA 46 (L) 06/07/2018 0525   GFRAA 54 (L) 06/07/2018 0525    BNP    Component Value Date/Time   BNP 845.8 (H) 06/05/2018 0053   BNP 85.3 11/01/2014 1636    ProBNP    Component Value Date/Time   PROBNP 397.0 (H) 06/03/2008 0525    Imaging: Dg Chest 2 View  Result Date: 06/05/2018 CLINICAL DATA:  Dyspnea and syncope EXAM: CHEST - 2 VIEW COMPARISON:  05/15/2018 FINDINGS: Stable cardiomegaly with aortic atherosclerosis. Stable mild interstitial prominence compatible with known fibrosis. Probable scarring at the left lung base. No overt pulmonary edema, pulmonary consolidation, effusion or pneumothorax. IMPRESSION: Stable cardiomegaly with aortic atherosclerosis. Chronic interstitial prominence compatible with known fibrosis. Probable scarring at the left lung base. No active pulmonary disease. Electronically Signed   By: Ashley Royalty M.D.   On: 06/05/2018 01:20   Dg Chest 2 View  Result Date: 05/15/2018 CLINICAL DATA:  Shortness of breath.  Pulmonary fibrosis. EXAM: CHEST - 2 VIEW COMPARISON:  January 27, 2018 FINDINGS: Mild reticular changes in the bases consistent with history of fibrosis. No pneumothorax. The heart size borderline. The hila and mediastinum are normal. No acute abnormalities. IMPRESSION: 1. Known pulmonary fibrosis.  No other acute abnormalities. Electronically Signed   By: Dorise Bullion III M.D   On: 05/15/2018 15:35   Ct Head Wo Contrast  Result Date: 06/05/2018 CLINICAL DATA:  84 year old female with fall and trauma to the head. EXAM: CT HEAD WITHOUT CONTRAST TECHNIQUE: Contiguous axial images were obtained from the base of the skull through the vertex without intravenous contrast. COMPARISON:  Head CT dated 05/31/2008 FINDINGS: Brain: There is  mild age-related atrophy and chronic microvascular ischemic changes. There is no acute intracranial hemorrhage. No mass effect midline shift no extra-axial fluid collection. Vascular: No hyperdense vessel or unexpected calcification. Skull: Normal. Negative for fracture or focal lesion. Sinuses/Orbits: Minimal mucoperiosteal thickening of paranasal sinuses. No air-fluid levels. Minimal bilateral mastoid effusions. Other: Right forehead hematoma. IMPRESSION: 1. No acute intracranial pathology. 2. Mild age-related atrophy and chronic microvascular ischemic changes. Electronically Signed   By: Anner Crete M.D.   On: 06/05/2018 01:42   Nm Pulmonary Perf And Vent  Result Date: 05/15/2018 CLINICAL DATA:  Pulmonary hypertension. EXAM: NUCLEAR MEDICINE VENTILATION - PERFUSION LUNG SCAN TECHNIQUE: Ventilation images were obtained in multiple projections using inhaled aerosol Tc-64m DTPA. Perfusion images were obtained in multiple projections after intravenous injection of Tc-72m-MAA. RADIOPHARMACEUTICALS:  31.7 mCi of Tc-65m DTPA aerosol inhalation and 4.2 mCi Tc82m-MAA IV COMPARISON:  May 15, 2018 FINDINGS: Ventilation: No unmatched defects. Minimally heterogeneous ventilation. Perfusion: No unmatched defects. Minimally heterogeneous ventilation. IMPRESSION: No evidence of acute or chronic pulmonary emboli on today's study. Very low probability V/Q scan. Electronically Signed   By: Dorise Bullion III M.D   On: 05/15/2018 15:33     Assessment & Plan:   Syncope - S/p hosp admission 7/19-7/21 for syncopal episode and fall - Large facial ecchymosis and hematoma. Head CT neg for actual intracranial abnormality.  - Seen by pulmonary and cardiology. Felt to be r/t hypoxia. No acute pulmonary disease or cardiogenic event. Echo unchanged from previous, no arrhythmia.   Pulmonary hypertension ([place]) -Seen by Dr. Haroldine Laws in June 2019, not felt to have [place]   COPD GOLD II  - Recent hypoxic event and syncopal  episode with hospital admission - Requiring high flow oxygen, in my opinion she could benefit from electronic scooter to assist with ADLs.   - Needs to use 8L oxygen at rest and 15L o2 with activity (order placed for E-tanks through [place] rehab, last visit Aug 6th and wants to go into maintenance program - Next follow up with Dr. Elsworth Soho Aug 21st    Chronic diastolic CHF (congestive heart failure) (HCC) - BNP 845 during hosp stay, received IV lasix. Cr bumped up to 1.5 (from 1.0). Diuretics held and Cr improved. Restarted home lasix dose. Recheck BMP today     Martyn Ehrich, NP 06/10/2018

## 2018-06-10 NOTE — Assessment & Plan Note (Addendum)
-   S/p hosp admission 7/19-7/21 for syncopal episode and fall - Large facial ecchymosis and hematoma. Head CT neg for actual intracranial abnormality.  - Seen by pulmonary and cardiology. Felt to be r/t hypoxia. No acute pulmonary disease or cardiogenic event. Echo unchanged from previous, no arrhythmia.

## 2018-06-10 NOTE — Assessment & Plan Note (Addendum)
-   Recent hypoxic event and syncopal episode with hospital admission - Requiring high flow oxygen, in my opinion she could benefit from electronic scooter to assist with ADLs.   - Needs to use 8L oxygen at rest and 15L o2 with activity (order placed for E-tanks through [place] rehab, last visit Aug 6th and wants to go into maintenance program - Next follow up with Dr. Elsworth Soho Aug 21st

## 2018-06-10 NOTE — Assessment & Plan Note (Addendum)
-   BNP 845 during hosp stay, received IV lasix. Cr bumped up to 1.5 (from 1.0). Diuretics held and Cr improved. Restarted home lasix dose. Recheck BMP today

## 2018-06-10 NOTE — Telephone Encounter (Signed)
Order has been placed in Epic for O2 order and Derl Barrow, NP has signed order. Nothing more needed at this time.

## 2018-06-11 ENCOUNTER — Telehealth: Payer: Self-pay | Admitting: Primary Care

## 2018-06-11 ENCOUNTER — Encounter (HOSPITAL_COMMUNITY): Payer: Medicare Other

## 2018-06-11 NOTE — Telephone Encounter (Signed)
Pt's daughter, Almyra Free, calling about home O2. Cb is [phone].

## 2018-06-11 NOTE — Progress Notes (Signed)
[name] 84 y.o. female  150 day Psychosocial Note  Patient psychosocial assessment reveals no barriers to participation in Pulmonary Rehab. Patient does feel she is making progress toward Pulmonary Rehab goals. Patient reports her health and activity level has improved in the past 30 days as evidenced by patient's report of increased ability to exercise with more ease in pulmonary rehab. Pt observed ambulating around the track with ease and maintaining an acceptable oxygen saturation. Patient states family/friends have noticed changes in her activity or mood.  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 Pt is presently on hold from exercise due to fall as a result of hypoxia.  Pt did not increase her oxygen to 15 L upon walking to her bedroom.  Pt lost consciousness and her oxygen level was 80%. Pt will return on August 6th to complete her walk test with rehab staff. This will allow rehab staff to validate expected progress. Even with her chronic disease process,  Patient reports  feeling positive about current and projected progression in Pulmonary Rehab. After reviewing the patient's treatment plan, the patient is making progress toward Pulmonary Rehab goals. Patient's rate of progress toward rehab goals is good. She has been slow to progress, but she is definitely progressing! 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 Plan of action to help patient continue to work towards rehab goals include supporting her in her desire to continue to exercise. Will continue to monitor and evaluate progress toward psychosocial goal(s).  Goal(s) in progress: Support her in continuing to exercise after graduation in the pulmonary maintenance  program here at [facility]. Help patient work toward returning to meaningful activities that improve patient's QOL and are attainable with patient's lung disease [name] Armed forces operational officer, BSN Cardiac and Training and development officer

## 2018-06-11 NOTE — Telephone Encounter (Signed)
RA---  Pt was seen yesterday by Geraldo Pitter, NP and was placed on 8 liters of oxygen while at rest and 15 liters with activity.    Katie spoke with patient-aware that order was placed per OV notes;however, pt understands that if she needs to increase O2 to 10L then she can turn it up and then call our office to let us know.   [facility] is aware and set pt up. Nothing further needed at this time.

## 2018-06-11 NOTE — Progress Notes (Signed)
Pulmonary Individual Treatment Plan  Patient Details  Name: NIURKA BENECKE MRN: [id number] Date of Birth: 1929/03/31 Referring Provider:     Pulmonary Rehab Walk Test from 01/22/2018 in [place]  Referring Provider  Dr. Elsworth Soho      Initial Encounter Date:    Pulmonary Rehab Walk Test from 01/22/2018 in [place]  Date  01/22/18      Visit Diagnosis: Stage 2 moderate COPD by GOLD classification ([place])  Patient's Home Medications on Admission:   Current Outpatient Medications:  .  acetaminophen (TYLENOL) 500 MG tablet, Take 1,000 mg by mouth at bedtime. , Disp: , Rfl:  .  Aloe-Sodium Chloride (AYR SALINE NASAL GEL NA), Place 1 application into the nose daily as needed (dryness)., Disp: , Rfl:  .  ANORO ELLIPTA 62.5-25 MCG/INH AEPB, INHALE 1 PUFF INTO THE LUNGS DAILY, Disp: 60 each, Rfl: 5 .  aspirin 81 MG tablet, Take 81 mg by mouth 2 (two) times daily. , Disp: , Rfl:  .  Carboxymethylcellul-Glycerin (LUBRICATING EYE DROPS OP), Apply 1 drop to eye daily as needed (dry eyes)., Disp: , Rfl:  .  denosumab (PROLIA) 60 MG/ML SOLN injection, Inject 60 mg into the skin every 6 (six) months. Administer in upper arm, thigh, or abdomen, Disp: , Rfl:  .  furosemide (LASIX) 20 MG tablet, Take 1 tablet (20 mg total) by mouth daily., Disp: 90 tablet, Rfl: 1 .  isosorbide mononitrate (IMDUR) 30 MG 24 hr tablet, TAKE 1 TABLET(30 MG) BY MOUTH DAILY, Disp: 90 tablet, Rfl: 2 .  levothyroxine (SYNTHROID, LEVOTHROID) 137 MCG tablet, Take 137 mcg by mouth daily.  , Disp: , Rfl:  .  metoprolol tartrate (LOPRESSOR) 50 MG tablet, Take 1 tablet (50 mg total) by mouth 2 (two) times daily., Disp: 180 tablet, Rfl: 3 .  multivitamin-lutein (OCUVITE-LUTEIN) CAPS capsule, Take 1 capsule by mouth daily., Disp: , Rfl:  .  nitroGLYCERIN (NITROSTAT) 0.4 MG SL tablet, Place 1 tablet (0.4 mg total) under the tongue every 5 (five) minutes as needed for chest pain  (MAX 3 doses)., Disp: 25 tablet, Rfl: 3 .  OXYGEN, Inhale 8 L into the lungs daily. , Disp: , Rfl:  .  Probiotic Product (PROBIOTIC DAILY PO), Take 1 tablet by mouth daily. , Disp: , Rfl:  .  rosuvastatin (CRESTOR) 40 MG tablet, Take 20 mg by mouth at bedtime. , Disp: , Rfl:   Past Medical History: Past Medical History:  Diagnosis Date  . Arthritis   . B12 deficiency   . Basal cell cancer    Nose  . Carotid artery occlusion   . Cataract   . Chronic diastolic CHF (congestive heart failure) ([place])   . Colon polyps   . COPD (chronic obstructive pulmonary disease) ([place])    STAGE 2  . Coronary artery disease    status post angioplasty of the mid RCA in 1995  . Dyspnea   . First degree atrioventricular block   . History of obesity   . Hyperlipidemia   . Hypertension    well controlled  . Hyperthyroidism   . Lichen sclerosus et atrophicus of the vulva   . Osteopenia   . PAD (peripheral artery disease) (H[facility])    with stable claudication  . Pulmonary fibrosis (H[facility])    wears 4L home O2 at baseline    Tobacco Use: Social History   Tobacco Use  Smoking Status Former Smoker  . Packs/day: 1.00  . Years:  40.00  . Pack years: 40.00  . Types: Cigarettes  . Last attempt to quit: 11/18/1993  . Years since quitting: 24.5  Smokeless Tobacco Never Used    Labs: Recent Chemical engineer    Labs for ITP Cardiac and Pulmonary Rehab Latest Ref Rng & Units 05/27/2008 12/28/2016   Hemoglobin A1c 4.8 - 5.6 % - 5.0   TCO2 - 23 -      Capillary Blood Glucose: Lab Results  Component Value Date   GLUCAP 96 12/30/2016   GLUCAP 97 12/30/2016   GLUCAP 132 (H) 12/29/2016   GLUCAP 140 (H) 12/29/2016   GLUCAP 123 (H) 12/29/2016     Pulmonary Assessment Scores: Pulmonary Assessment Scores    Row Name 01/15/18 1746 01/23/18 0704       ADL UCSD   ADL Phase  Entry  Entry    SOB Score total  49  -      CAT Score   CAT Score  20 Entry  -      mMRC Score   mMRC Score  -  0        Pulmonary Function Assessment: Pulmonary Function Assessment - 01/12/18 1453      Breath   Bilateral Breath Sounds  Clear    Shortness of Breath  Limiting activity;Yes       Exercise Target Goals:    Exercise Program Goal: Individual exercise prescription set using results from initial 6 min walk test and THRR while considering  patient's activity barriers and safety.    Exercise Prescription Goal: Initial exercise prescription builds to 30-45 minutes a day of aerobic activity, 2-3 days per week.  Home exercise guidelines will be given to patient during program as part of exercise prescription that the participant will acknowledge.  Activity Barriers & Risk Stratification: Activity Barriers & Cardiac Risk Stratification - 01/12/18 1436      Activity Barriers & Cardiac Risk Stratification   Activity Barriers  Shortness of Breath;Deconditioning       6 Minute Walk: 6 Minute Walk    Row Name 01/23/18 0700         6 Minute Walk   Phase  Initial     Distance  800 feet     Walk Time  - 5 minutes 10 seconds     # of Rest Breaks  1 stopped by ep due to low sat     MPH  1.51     METS  2.15     RPE  13     Perceived Dyspnea   2     Symptoms  Yes (comment)     Comments  used wheelchair and forehead probe-very slow o2 recovery     Resting HR  68 bpm     Resting BP  162/78     Resting Oxygen Saturation   91 %     Exercise Oxygen Saturation  during 6 min walk  78 %     Max Ex. HR  137 bpm     Max Ex. BP  180/64       Interval HR   1 Minute HR  100     2 Minute HR  118     3 Minute HR  130     4 Minute HR  132     5 Minute HR  132     6 Minute HR  137     2 Minute Post HR  110     Interval  Heart Rate?  Yes       Interval Oxygen   Interval Oxygen?  Yes     Baseline Oxygen Saturation %  91 %     1 Minute Oxygen Saturation %  95 %     1 Minute Liters of Oxygen  10 L     2 Minute Oxygen Saturation %  84 %     2 Minute Liters of Oxygen  10 L     3 Minute Oxygen  Saturation %  81 %     3 Minute Liters of Oxygen  15 L     4 Minute Oxygen Saturation %  85 % rest break by EP     4 Minute Liters of Oxygen  15 L     5 Minute Oxygen Saturation %  85 %     5 Minute Liters of Oxygen  15 L     6 Minute Oxygen Saturation %  78 %     6 Minute Liters of Oxygen  15 L     2 Minute Post Oxygen Saturation %  82 % did not recover to 91% until 4 minutes post 28mt     2 Minute Post Liters of Oxygen  10 L        Oxygen Initial Assessment: Oxygen Initial Assessment - 01/23/18 0703      Initial 6 min Walk   Oxygen Used  Continuous;E-Tanks    Liters per minute  15      Program Oxygen Prescription   Program Oxygen Prescription  Continuous;E-Tanks    Liters per minute  -- 10-15    Comments  Will get guidlines from Dr. AElsworth Sohofor O2 Sat and liter flow       Oxygen Re-Evaluation: Oxygen Re-Evaluation    Row Name 01/26/18 1346 02/20/18 1045 03/23/18 1653 04/21/18 0744 05/15/18 1023     Program Oxygen Prescription   Program Oxygen Prescription  Continuous;E-Tanks  Continuous;E-Tanks  Continuous;E-Tanks  Continuous;E-Tanks  Continuous;E-Tanks   Liters per minute  -  15 NRB  15 NRB  15  25 25L with walking and 15L with seated   Comments  -  Patient needs to stay above 90%--will try 25 liters  -  Patient needs to stay above 90%--will try 25 liters  -     Home Oxygen   Home Oxygen Device  -  Portable Concentrator;Home Concentrator;E-Tanks  Portable Concentrator;Home Concentrator;E-Tanks  Portable Concentrator;Home Concentrator;E-Tanks  Portable Concentrator;Home Concentrator;E-Tanks   Sleep Oxygen Prescription  -  Continuous  Continuous  Continuous  Continuous   Liters per minute  -  '6  6  6  6   '$ Home Exercise Oxygen Prescription  -  Continuous Exercise at home is not encouraged at this time.  None Not exercising at home due to high liter flow needs  None  None   Liters per minute  -  15  -  15  -   Home at Rest Exercise Oxygen Prescription  -  Continuous  Continuous   Continuous  Continuous   Liters per minute  -  '6  6  6  6 '$ 6-8   Compliance with Home Oxygen Use  -  Yes  Yes  Yes  No     Goals/Expected Outcomes   Short Term Goals  -  To learn and exhibit compliance with exercise, home and travel O2 prescription;To learn and understand importance of monitoring SPO2 with pulse oximeter and demonstrate accurate use of the pulse oximeter.;To learn  and understand importance of maintaining oxygen saturations>88%;To learn and demonstrate proper pursed lip breathing techniques or other breathing techniques.;To learn and demonstrate proper use of respiratory medications  To learn and exhibit compliance with exercise, home and travel O2 prescription;To learn and understand importance of monitoring SPO2 with pulse oximeter and demonstrate accurate use of the pulse oximeter.;To learn and understand importance of maintaining oxygen saturations>88%;To learn and demonstrate proper pursed lip breathing techniques or other breathing techniques.;To learn and demonstrate proper use of respiratory medications  To learn and exhibit compliance with exercise, home and travel O2 prescription;To learn and understand importance of monitoring SPO2 with pulse oximeter and demonstrate accurate use of the pulse oximeter.;To learn and understand importance of maintaining oxygen saturations>88%;To learn and demonstrate proper pursed lip breathing techniques or other breathing techniques.;To learn and demonstrate proper use of respiratory medications  To learn and exhibit compliance with exercise, home and travel O2 prescription;To learn and understand importance of monitoring SPO2 with pulse oximeter and demonstrate accurate use of the pulse oximeter.;To learn and understand importance of maintaining oxygen saturations>88%;To learn and demonstrate proper pursed lip breathing techniques or other breathing techniques.;To learn and demonstrate proper use of respiratory medications   Long  Term Goals  -   Exhibits compliance with exercise, home and travel O2 prescription;Verbalizes importance of monitoring SPO2 with pulse oximeter and return demonstration;Maintenance of O2 saturations>88%;Exhibits proper breathing techniques, such as pursed lip breathing or other method taught during program session;Compliance with respiratory medication;Demonstrates proper use of MDI's  Exhibits compliance with exercise, home and travel O2 prescription;Verbalizes importance of monitoring SPO2 with pulse oximeter and return demonstration;Maintenance of O2 saturations>88%;Exhibits proper breathing techniques, such as pursed lip breathing or other method taught during program session;Compliance with respiratory medication;Demonstrates proper use of MDI's  Exhibits compliance with exercise, home and travel O2 prescription;Verbalizes importance of monitoring SPO2 with pulse oximeter and return demonstration;Maintenance of O2 saturations>88%;Exhibits proper breathing techniques, such as pursed lip breathing or other method taught during program session;Compliance with respiratory medication;Demonstrates proper use of MDI's  Exhibits compliance with exercise, home and travel O2 prescription;Verbalizes importance of monitoring SPO2 with pulse oximeter and return demonstration;Maintenance of O2 saturations>88%;Exhibits proper breathing techniques, such as pursed lip breathing or other method taught during program session;Compliance with respiratory medication;Demonstrates proper use of MDI's   Goals/Expected Outcomes  -  compliance  Patient to maintain compliance  Patient to maintain compliance  -   Row Name 06/08/18 0918             Program Oxygen Prescription   Program Oxygen Prescription  Continuous;E-Tanks       Liters per minute  25       Comments  Patient needs 25 liters with walking 15 seated         Home Oxygen   Home Oxygen Device  Portable Concentrator;Home Concentrator;E-Tanks       Sleep Oxygen Prescription   Continuous       Liters per minute  6       Home Exercise Oxygen Prescription  None Can not exercise at home since she has high liter flow.       Home at Rest Exercise Oxygen Prescription  Continuous       Liters per minute  6 6-8       Compliance with Home Oxygen Use  Yes         Goals/Expected Outcomes   Short Term Goals  To learn and exhibit compliance with exercise, home and travel O2 prescription;To  learn and understand importance of monitoring SPO2 with pulse oximeter and demonstrate accurate use of the pulse oximeter.;To learn and understand importance of maintaining oxygen saturations>88%;To learn and demonstrate proper pursed lip breathing techniques or other breathing techniques.;To learn and demonstrate proper use of respiratory medications       Long  Term Goals  Exhibits compliance with exercise, home and travel O2 prescription;Verbalizes importance of monitoring SPO2 with pulse oximeter and return demonstration;Maintenance of O2 saturations>88%;Exhibits proper breathing techniques, such as pursed lip breathing or other method taught during program session;Compliance with respiratory medication;Demonstrates proper use of MDI's          Oxygen Discharge (Final Oxygen Re-Evaluation): Oxygen Re-Evaluation - 06/08/18 0918      Program Oxygen Prescription   Program Oxygen Prescription  Continuous;E-Tanks    Liters per minute  25    Comments  Patient needs 25 liters with walking 15 seated      Home Oxygen   Home Oxygen Device  Portable Concentrator;Home Concentrator;E-Tanks    Sleep Oxygen Prescription  Continuous    Liters per minute  6    Home Exercise Oxygen Prescription  None Can not exercise at home since she has high liter flow.    Home at Rest Exercise Oxygen Prescription  Continuous    Liters per minute  6 6-8    Compliance with Home Oxygen Use  Yes      Goals/Expected Outcomes   Short Term Goals  To learn and exhibit compliance with exercise, home and travel O2  prescription;To learn and understand importance of monitoring SPO2 with pulse oximeter and demonstrate accurate use of the pulse oximeter.;To learn and understand importance of maintaining oxygen saturations>88%;To learn and demonstrate proper pursed lip breathing techniques or other breathing techniques.;To learn and demonstrate proper use of respiratory medications    Long  Term Goals  Exhibits compliance with exercise, home and travel O2 prescription;Verbalizes importance of monitoring SPO2 with pulse oximeter and return demonstration;Maintenance of O2 saturations>88%;Exhibits proper breathing techniques, such as pursed lip breathing or other method taught during program session;Compliance with respiratory medication;Demonstrates proper use of MDI's       Initial Exercise Prescription: Initial Exercise Prescription - 01/23/18 0700      Date of Initial Exercise RX and Referring Provider   Date  01/22/18    Referring Provider  Dr. Elsworth Soho      Oxygen   Oxygen  Continuous    Liters  -- 10-15      Recumbant Bike   Level  2    Watts  10    Minutes  17      NuStep   Level  2    SPM  80    Minutes  17    METs  1.5      Track   Laps  5    Minutes  17      Prescription Details   Frequency (times per week)  2    Duration  Progress to 45 minutes of aerobic exercise without signs/symptoms of physical distress      Intensity   THRR 40-80% of Max Heartrate  54-107    Ratings of Perceived Exertion  11-13    Perceived Dyspnea  0-4      Progression   Progression  Continue progressive overload as per policy without signs/symptoms or physical distress.      Resistance Training   Training Prescription  Yes    Weight  orange bands    Reps  10-15       Perform Capillary Blood Glucose checks as needed.  Exercise Prescription Changes: Exercise Prescription Changes    Row Name 02/05/18 1300 02/17/18 1231 03/03/18 1200 03/31/18 1200 04/14/18 1200     Response to Exercise   Blood  Pressure (Admit)  144/52  122/58  136/62  124/64  124/74   Blood Pressure (Exercise)  178/72  146/50  138/70  144/58  164/64   Blood Pressure (Exit)  128/70  128/58  124/70  120/60  130/80   Heart Rate (Admit)  65 bpm  74 bpm  66 bpm  71 bpm  75 bpm   Heart Rate (Exercise)  93 bpm  92 bpm  87 bpm  103 bpm  104 bpm   Heart Rate (Exit)  68 bpm  81 bpm  61 bpm  99 bpm  67 bpm   Oxygen Saturation (Admit)  92 %  98 %  96 %  99 %  99 %   Oxygen Saturation (Exercise)  88 %  89 %  88 %  88 %  80 %   Oxygen Saturation (Exit)  96 %  100 %  97 %  100 %  97 %   Rating of Perceived Exertion (Exercise)  '15  13  12  13  11   '$ Perceived Dyspnea (Exercise)  2  1  0  1  1   Duration  Progress to 45 minutes of aerobic exercise without signs/symptoms of physical distress  Progress to 45 minutes of aerobic exercise without signs/symptoms of physical distress  Progress to 45 minutes of aerobic exercise without signs/symptoms of physical distress  Progress to 45 minutes of aerobic exercise without signs/symptoms of physical distress  Progress to 45 minutes of aerobic exercise without signs/symptoms of physical distress   Intensity  Other (comment)  Other (comment)  Other (comment)  Other (comment)  Other (comment)     Progression   Progression  Continue to progress workloads to maintain intensity without signs/symptoms of physical distress. 40-80% HRR  Continue to progress workloads to maintain intensity without signs/symptoms of physical distress. 40-80% HRR  Continue to progress workloads to maintain intensity without signs/symptoms of physical distress. 40-80% HRR  Continue to progress workloads to maintain intensity without signs/symptoms of physical distress. 40-80% HRR  Continue to progress workloads to maintain intensity without signs/symptoms of physical distress. 40-80% HRR     Resistance Training   Training Prescription  Yes  Yes  Yes  Yes  Yes   Weight  orange bands  orange bands  orange bands  orange bands   orange bands   Reps  10-15  10-15  10-15  10-15  10-15   Time  10 Minutes  10 Minutes  10 Minutes  10 Minutes  10 Minutes     Oxygen   Oxygen  Continuous  Continuous  Continuous  Continuous  Continuous   Liters  15 L, 100% NRB  15 L, 100% NRB  15 L, 100% NRB  15 L, 100% NRB  15 L, 100% NRB     Recumbant Bike   Level  2.5  2.5  -  2  2   Watts  10  10  -  -  -   Minutes  17  17  -  17  17     NuStep   Level  '4  4  4  4  4   '$ SPM  80  80  80  80  80   Minutes  17  34  34  34  34   METs  2.4  2.4  2.2  1.9  1.9   Row Name 04/28/18 1300 05/12/18 1200 05/26/18 1200 06/04/18 1124       Response to Exercise   Blood Pressure (Admit)  158/74  140/58  152/74  136/60    Blood Pressure (Exercise)  140/68  146/50  124/62  144/50    Blood Pressure (Exit)  123/60  102/56  110/56  126/70    Heart Rate (Admit)  73 bpm  70 bpm  86 bpm  74 bpm    Heart Rate (Exercise)  100 bpm  100 bpm  92 bpm  95 bpm    Heart Rate (Exit)  91 bpm  82 bpm  86 bpm  86 bpm    Oxygen Saturation (Admit)  97 %  98 %  97 %  97 %    Oxygen Saturation (Exercise)  85 %  89 %  88 %  86 %    Oxygen Saturation (Exit)  89 %  96 %  86 %  98 %    Rating of Perceived Exertion (Exercise)  '11  13  11  15    '$ Perceived Dyspnea (Exercise)  1  0  2  3    Duration  Progress to 45 minutes of aerobic exercise without signs/symptoms of physical distress  Progress to 45 minutes of aerobic exercise without signs/symptoms of physical distress  Progress to 45 minutes of aerobic exercise without signs/symptoms of physical distress  Progress to 45 minutes of aerobic exercise without signs/symptoms of physical distress    Intensity  THRR unchanged  THRR unchanged  THRR unchanged  THRR unchanged      Progression   Progression  Continue to progress workloads to maintain intensity without signs/symptoms of physical distress.  Continue to progress workloads to maintain intensity without signs/symptoms of physical distress.  Continue to progress  workloads to maintain intensity without signs/symptoms of physical distress.  Continue to progress workloads to maintain intensity without signs/symptoms of physical distress.      Resistance Training   Training Prescription  Yes  Yes  Yes  Yes    Weight  orange bands  orange bands  orange bands  orange bands    Reps  10-15  10-15  10-15  10-15    Time  10 Minutes  10 Minutes  10 Minutes  10 Minutes      Interval Training   Interval Training  -  No  No  No      Oxygen   Oxygen  Continuous  Continuous  Continuous  Continuous    Liters  25L, 100% NRB  15-25 L, 100% NRB  15-25 L, 100% NRB  15-25 L, 100% NRB      NuStep   Level  '4  5  4  5    '$ SPM  80  80  80  80    Minutes  '17  17  17  17    '$ METs  2.1  2.4  2.5  2.2      Track   Laps  '22  16  16  7    '$ Minutes  34  34  34  17       Exercise Comments:   Exercise Goals and Review: Exercise Goals    Row Name 01/12/18 1437  Exercise Goals   Increase Physical Activity  Yes       Intervention  Provide advice, education, support and counseling about physical activity/exercise needs.;Develop an individualized exercise prescription for aerobic and resistive training based on initial evaluation findings, risk stratification, comorbidities and participant's personal goals.       Expected Outcomes  Short Term: Attend rehab on a regular basis to increase amount of physical activity.;Long Term: Add in home exercise to make exercise part of routine and to increase amount of physical activity.;Long Term: Exercising regularly at least 3-5 days a week.       Increase Strength and Stamina  Yes       Intervention  Provide advice, education, support and counseling about physical activity/exercise needs.;Develop an individualized exercise prescription for aerobic and resistive training based on initial evaluation findings, risk stratification, comorbidities and participant's personal goals.       Expected Outcomes  Short Term: Increase  workloads from initial exercise prescription for resistance, speed, and METs.;Short Term: Perform resistance training exercises routinely during rehab and add in resistance training at home;Long Term: Improve cardiorespiratory fitness, muscular endurance and strength as measured by increased METs and functional capacity (6MWT)       Able to understand and use rate of perceived exertion (RPE) scale  Yes       Intervention  Provide education and explanation on how to use RPE scale       Expected Outcomes  Short Term: Able to use RPE daily in rehab to express subjective intensity level;Long Term:  Able to use RPE to guide intensity level when exercising independently       Able to understand and use Dyspnea scale  Yes       Intervention  Provide education and explanation on how to use Dyspnea scale       Expected Outcomes  Short Term: Able to use Dyspnea scale daily in rehab to express subjective sense of shortness of breath during exertion;Long Term: Able to use Dyspnea scale to guide intensity level when exercising independently       Knowledge and understanding of Target Heart Rate Range (THRR)  Yes       Intervention  Provide education and explanation of THRR including how the numbers were predicted and where they are located for reference       Expected Outcomes  Short Term: Able to state/look up THRR;Short Term: Able to use daily as guideline for intensity in rehab;Long Term: Able to use THRR to govern intensity when exercising independently       Understanding of Exercise Prescription  Yes       Intervention  Provide education, explanation, and written materials on patient's individual exercise prescription       Expected Outcomes  Short Term: Able to explain program exercise prescription;Long Term: Able to explain home exercise prescription to exercise independently          Exercise Goals Re-Evaluation : Exercise Goals Re-Evaluation    Row Name 01/26/18 1346 02/20/18 1055 03/23/18 1655  04/21/18 0744 05/15/18 1025     Exercise Goal Re-Evaluation   Exercise Goals Review  -  Increase Physical Activity;Able to understand and use rate of perceived exertion (RPE) scale;Knowledge and understanding of Target Heart Rate Range (THRR);Understanding of Exercise Prescription;Increase Strength and Stamina;Able to understand and use Dyspnea scale  Increase Physical Activity;Able to understand and use rate of perceived exertion (RPE) scale;Knowledge and understanding of Target Heart Rate Range (THRR);Understanding of Exercise Prescription;Increase Strength and Stamina;Able  to understand and use Dyspnea scale  Increase Physical Activity;Able to understand and use rate of perceived exertion (RPE) scale;Knowledge and understanding of Target Heart Rate Range (THRR);Understanding of Exercise Prescription;Increase Strength and Stamina;Able to understand and use Dyspnea scale  Increase Physical Activity;Able to understand and use rate of perceived exertion (RPE) scale;Knowledge and understanding of Target Heart Rate Range (THRR);Understanding of Exercise Prescription;Increase Strength and Stamina;Able to understand and use Dyspnea scale   Comments  Patient's first day of exercise will be 01/29/18  Patient is currently only doing seated exercise due to her desaturations. Will increase her to 25 liters with NRB next time she is here to see if this will allow her to walk on the track while keeping her o2 sats above 90%  Patient is currently only doing seated exercise due to her desaturations. Not exercising at home at this point due to high liter flow needs.  Patient is currently only doing seated exercise due to her desaturations. Patient is going to walk today on potentially 25 liters with NRB to see what her 02 sats are. Patient is eager to walk. Not exercising at home at this point due to high liter flow needs.  Raynie is now walking the track for 30 minutes on 25 liters. Tolerating well. Increased workloads on the  NUSTEP-patient feels like she is not finally progressing. High liter flow is a barrier. Will cont. to monitor. Home exercise not appropriate at this time due to high liter flows.   Expected Outcomes  -  Through exercise at rehab and at home, patient will increase physical capacity and be able to carry out ADL's with ease at home. Patient will also gain the confidence and knowledge to adhere to an exercise regime at home.  Through exercise at rehab and at home, patient will increase physical capacity and be able to carry out ADL's with ease at home. Patient will also gain the confidence and knowledge to adhere to an exercise regime at home.  Through exercise at rehab and at home, the patient will decrease shortness of breath with daily activities and feel confident in carrying out an exercise regime at home.   Through exercise at rehab and at home, the patient will decrease shortness of breath with daily activities and feel confident in carrying out an exercise regime at home.    [place] Name 06/08/18 0920             Exercise Goal Re-Evaluation   Exercise Goals Review  Increase Physical Activity;Able to understand and use rate of perceived exertion (RPE) scale;Knowledge and understanding of Target Heart Rate Range (THRR);Understanding of Exercise Prescription;Increase Strength and Stamina;Able to understand and use Dyspnea scale       Comments  Shelbylynn is now walking the track for 30 minutes on 25 liters. Tolerating well. Increased workloads on the NUSTEP-patient feels like she is finally progressing. High liter flow is a barrier. Will cont. to monitor. Home exercise not appropriate at this time due to high liter flows.       Expected Outcomes  Through exercise at rehab and at home, the patient will decrease shortness of breath with daily activities and feel confident in carrying out an exercise regime at home.           Discharge Exercise Prescription (Final Exercise Prescription Changes): Exercise  Prescription Changes - 06/04/18 1124      Response to Exercise   Blood Pressure (Admit)  136/60    Blood Pressure (Exercise)  144/50  Blood Pressure (Exit)  126/70    Heart Rate (Admit)  74 bpm    Heart Rate (Exercise)  95 bpm    Heart Rate (Exit)  86 bpm    Oxygen Saturation (Admit)  97 %    Oxygen Saturation (Exercise)  86 %    Oxygen Saturation (Exit)  98 %    Rating of Perceived Exertion (Exercise)  15    Perceived Dyspnea (Exercise)  3    Duration  Progress to 45 minutes of aerobic exercise without signs/symptoms of physical distress    Intensity  THRR unchanged      Progression   Progression  Continue to progress workloads to maintain intensity without signs/symptoms of physical distress.      Resistance Training   Training Prescription  Yes    Weight  orange bands    Reps  10-15    Time  10 Minutes      Interval Training   Interval Training  No      Oxygen   Oxygen  Continuous    Liters  15-25 L, 100% NRB      NuStep   Level  5    SPM  80    Minutes  17    METs  2.2      Track   Laps  7    Minutes  17       Nutrition:  Target Goals: Understanding of nutrition guidelines, daily intake of sodium '1500mg'$ , cholesterol '200mg'$ , calories 30% from fat and 7% or less from saturated fats, daily to have 5 or more servings of fruits and vegetables.  Biometrics: Pre Biometrics - 01/12/18 1459      Pre Biometrics   Grip Strength  30 kg        Nutrition Therapy Plan and Nutrition Goals: Nutrition Therapy & Goals - 02/12/18 1224      Nutrition Therapy   Diet  Heart Healthy      Personal Nutrition Goals   Nutrition Goal  Describe the benefit of including fruits, vegetables, whole grains, and low-fat dairy products in a healthy meal plan.      Intervention Plan   Intervention  Prescribe, educate and counsel regarding individualized specific dietary modifications aiming towards targeted core components such as weight, hypertension, lipid management, diabetes,  heart failure and other comorbidities.    Expected Outcomes  Short Term Goal: Understand basic principles of dietary content, such as calories, fat, sodium, cholesterol and nutrients.;Long Term Goal: Adherence to prescribed nutrition plan.       Nutrition Assessments: Nutrition Assessments - 02/12/18 1224      Rate Your Plate Scores   Pre Score  58       Nutrition Goals Re-Evaluation:   Nutrition Goals Discharge (Final Nutrition Goals Re-Evaluation):   Psychosocial: Target Goals: Acknowledge presence or absence of significant depression and/or stress, maximize coping skills, provide positive support system. Participant is able to verbalize types and ability to use techniques and skills needed for reducing stress and depression.  Initial Review & Psychosocial Screening: Initial Psych Review & Screening - 01/12/18 1456      Initial Review   Current issues with  None Identified      Family Dynamics   Good Support System?  Yes      Barriers   Psychosocial barriers to participate in program  There are no identifiable barriers or psychosocial needs.      Screening Interventions   Interventions  Encouraged to exercise  Quality of Life Scores:  Scores of 19 and below usually indicate a poorer quality of life in these areas.  A difference of  2-3 points is a clinically meaningful difference.  A difference of 2-3 points in the total score of the Quality of Life Index has been associated with significant improvement in overall quality of life, self-image, physical symptoms, and general health in studies assessing change in quality of life.   PHQ-9: Recent Review Flowsheet Data    Depression screen [facility] 2/9 01/12/2018   Decreased Interest 0   Down, Depressed, Hopeless 0   PHQ - 2 Score 0     Interpretation of Total Score  Total Score Depression Severity:  1-4 = Minimal depression, 5-9 = Mild depression, 10-14 = Moderate depression, 15-19 = Moderately severe depression,  20-27 = Severe depression   Psychosocial Evaluation and Intervention: Psychosocial Evaluation - 06/08/18 1642      Psychosocial Evaluation & Interventions   Interventions  Encouraged to exercise with the program and follow exercise prescription    Comments  Pt interacts well with fellow participants and staff.  Pt offers good input during education classes.    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 Pt will graduate when able to return to rehab and plans to continue exercising in the pulmonary maintenance program    Expected Outcomes  patient will remain free from psychosocial barriers to participation in pulmonary rehab   patient will remain free from psychosocial barriers to participation in pulmonary rehab   patient will remain free from psychosocial barriers to participation in pulmonary rehab   patient will remain free from psychosocial barriers to participation in pulmonary rehab     Interventions  Encouraged to attend Pulmonary Rehabilitation for the exercise  Encouraged to attend Pulmonary  Rehabilitation for the exercise  Encouraged to attend Pulmonary Rehabilitation for the exercise  Encouraged to attend Pulmonary Rehabilitation for the exercise    Continue Psychosocial Services   Follow up required by staff  Follow up required by staff  Follow up required by staff  Follow up required by staff       Psychosocial Discharge (Final Psychosocial Re-Evaluation): Psychosocial Re-Evaluation - 06/08/18 1645      Psychosocial Re-Evaluation   Current issues with  None Identified    Comments  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 Pt will graduate when able to return to rehab and plans to continue exercising in the pulmonary maintenance program    Expected Outcomes  patient will remain free  from psychosocial barriers to participation in pulmonary rehab     Interventions  Encouraged to attend Pulmonary Rehabilitation for the exercise    Continue Psychosocial Services   Follow up required by staff       Education: Education Goals: Education classes will be provided on a weekly basis, covering required topics. Participant will state understanding/return demonstration of topics presented.  Learning Barriers/Preferences: Learning Barriers/Preferences - 01/12/18 1453      Learning Barriers/Preferences   Learning Barriers  None    Learning Preferences  Written Material;Group Instruction;Verbal Instruction;Skilled Demonstration       Education Topics: Risk Factor Reduction:  -Group instruction that is supported by a PowerPoint presentation. Instructor discusses the definition of a risk factor, different risk factors for pulmonary disease, and how the heart and lungs work together.     Nutrition for Pulmonary Patient:  -Group instruction provided by PowerPoint slides, verbal discussion, and written materials to support subject matter. The instructor gives an explanation and review of healthy diet  recommendations, which includes a discussion on weight management, recommendations for fruit and vegetable consumption, as well as protein, fluid, caffeine, fiber, sodium, sugar, and alcohol. Tips for eating when patients are short of breath are discussed.   PULMONARY REHAB CHRONIC OBSTRUCTIVE PULMONARY DISEASE from 05/28/2018 in [place]  Date  04/02/18  Educator  Parke Simmers  Instruction Review Code  2- Demonstrated Understanding      Pursed Lip Breathing:  -Group instruction that is supported by demonstration and informational handouts. Instructor discusses the benefits of pursed lip and diaphragmatic breathing and detailed demonstration on how to preform both.     Oxygen Safety:  -Group instruction provided by PowerPoint, verbal discussion, and written material to support subject matter. There is an overview of "What is Oxygen" and "Why do we need it".  Instructor also reviews how to create a safe environment for oxygen use, the importance of using oxygen as prescribed, and the risks of noncompliance. There is a brief discussion on traveling with oxygen and resources the patient may utilize.   PULMONARY REHAB CHRONIC OBSTRUCTIVE PULMONARY DISEASE from 05/28/2018 in [place]  Date  03/05/18  Educator  [place]  Instruction Review Code  2- Demonstrated Understanding      Oxygen Equipment:  -Group instruction provided by [facility] Staff utilizing handouts, written materials, and equipment demonstrations.   PULMONARY REHAB CHRONIC OBSTRUCTIVE PULMONARY DISEASE from 05/28/2018 in [place]  Date  03/19/18  Educator  [place]  Instruction Review Code  2- Demonstrated Understanding      Signs and Symptoms:  -Group instruction provided by written material and verbal discussion to support subject matter. Warning signs and symptoms of infection, stroke, and heart attack are reviewed and when to call the  physician/911 reinforced. Tips for preventing the spread of infection discussed.   PULMONARY REHAB CHRONIC OBSTRUCTIVE PULMONARY DISEASE from 05/28/2018 in [place]  Date  05/28/18  Educator  Remo Lipps  Instruction Review Code  1- Verbalizes Understanding      Advanced Directives:  -Group instruction provided by verbal instruction and written material to support subject matter. Instructor reviews Advanced Directive laws and proper instruction for filling out document.   Pulmonary Video:  -Group video education that reviews the importance of medication and oxygen compliance, exercise, good nutrition, pulmonary hygiene, and pursed lip and diaphragmatic breathing for the pulmonary patient.   Exercise for the Pulmonary Patient:  -Group  instruction that is supported by a PowerPoint presentation. Instructor discusses benefits of exercise, core components of exercise, frequency, duration, and intensity of an exercise routine, importance of utilizing pulse oximetry during exercise, safety while exercising, and options of places to exercise outside of rehab.     PULMONARY REHAB CHRONIC OBSTRUCTIVE PULMONARY DISEASE from 05/28/2018 in [place]  Date  03/26/18  Educator  EP  Instruction Review Code  1- Verbalizes Understanding      Pulmonary Medications:  -Verbally interactive group education provided by instructor with focus on inhaled medications and proper administration.   PULMONARY REHAB CHRONIC OBSTRUCTIVE PULMONARY DISEASE from 05/28/2018 in [place]  Date  02/12/18  Educator  Pharm  Instruction Review Code  2- Demonstrated Understanding      Anatomy and Physiology of the Respiratory System and Intimacy:  -Group instruction provided by PowerPoint, verbal discussion, and written material to support subject matter. Instructor reviews respiratory cycle and anatomical components of the respiratory  system and their functions. Instructor also reviews differences in obstructive and restrictive respiratory diseases with examples of each. Intimacy, Sex, and Sexuality differences are reviewed with a discussion on how relationships can change when diagnosed with pulmonary disease. Common sexual concerns are reviewed.   PULMONARY REHAB CHRONIC OBSTRUCTIVE PULMONARY DISEASE from 05/28/2018 in [place]  Date  04/16/18  Educator  rn  Instruction Review Code  2- Demonstrated Understanding      MD DAY -A group question and answer session with a medical doctor that allows participants to ask questions that relate to their pulmonary disease state.   PULMONARY REHAB CHRONIC OBSTRUCTIVE PULMONARY DISEASE from 05/28/2018 in [place]  Date  04/23/18  Educator  Dr. Nelda Marseille  Instruction Review Code  1- Verbalizes Understanding      OTHER EDUCATION -Group or individual verbal, written, or video instructions that support the educational goals of the pulmonary rehab program.   PULMONARY REHAB CHRONIC OBSTRUCTIVE PULMONARY DISEASE from 05/28/2018 in [place]  Date  05/14/18  Educator  [place]  Instruction Review Code  1- Verbalizes Understanding      Holiday Eating Survival Tips:  -Group instruction provided by PowerPoint slides, verbal discussion, and written materials to support subject matter. The instructor gives patients tips, tricks, and techniques to help them not only survive but enjoy the holidays despite the onslaught of food that accompanies the holidays.   Knowledge Questionnaire Score: Knowledge Questionnaire Score - 01/15/18 1746      Knowledge Questionnaire Score   Pre Score  13/18       Core Components/Risk Factors/Patient Goals at Admission: Personal Goals and Risk Factors at Admission - 01/12/18 1456      Core Components/Risk  Factors/Patient Goals on Admission   Improve shortness of breath with ADL's  Yes    Intervention  Provide education, individualized exercise plan and daily activity instruction to help decrease symptoms of SOB with activities of daily living.    Expected Outcomes  Short Term: Improve cardiorespiratory fitness to achieve a reduction of symptoms when performing ADLs;Long Term: Be able to perform more ADLs without symptoms or delay the onset of symptoms       Core Components/Risk Factors/Patient Goals Review:  Goals and Risk Factor Review    [place] Name 02/23/18 1105 03/23/18 1302 04/20/18 1635 05/18/18 1441 06/08/18 1641     Core Components/Risk Factors/Patient Goals  Review   Personal Goals Review  Improve shortness of breath with ADL's  Improve shortness of breath with ADL's;Develop more efficient breathing techniques such as purse lipped breathing and diaphragmatic breathing and practicing self-pacing with activity.  Improve shortness of breath with ADL's;Develop more efficient breathing techniques such as purse lipped breathing and diaphragmatic breathing and practicing self-pacing with activity.  Improve shortness of breath with ADL's  Improve shortness of breath with ADL's   Review  patient got off to a slow start in pulmonary rehab related to her inability to stay oxygenated with the maximum amount of oxygen we could supply to her. in collaboration with her pulmonologist it was decided that she would not walk during rehab related to continued desaturations below adjusted parameters on maximum flow. patient does tolerated seated exercise and increased workloads on equiptment. she has not seen an improvement in her shortness of breath as of yet. will continue to monitor and hope to see progression towards improvement in shortness of breath over the next 30 days.  Latarra is doing OK in pulmonary rehab. she was seen by our medical director during the last 30 days and it was identified that the patient was in  need of a HF consult for her [place]. she subsiquently saw her primary pulmonologist who facilitated this referral and patient is scheduled to see Dr. Haroldine Laws in June. she remains on 100% NRB for exercise however if she is not walking we maybe able to do a trial of O2 using a venti mask at 50%. During ambulation she continues to desaturate significantly unless she is on 25L however our current order is only for 15. will wait until patient has appointment with HF clinic for more guided direction. she states she enjoys PR and believes her shortness of breath has improved during her admission. she has had a minor incidences of epitaxis that has prohibited her from exercise. That has been resolved with a visit to the ENT and directions on nasal hydration and use of Afrin for bleeding. will continued to monitor towards pulmonary rehab goals during the next 30 days.  Melonie is doing good in pulmonary rehab. she was seen by our medical director during the last 30 days and it was identified that the patient was in need of a HF consult for her [place]. she subsiquently saw her primary pulmonologist who facilitated this referral and patient saw Dr. Haroldine Laws in June. she remains on 100% NRB for exercise however if she is not walking we maybe able to do a trial of O2 using a venti mask at 50%. During ambulation she continues to desaturate significantly unless she is on 25L however our current order is only for 15. will wait until patient has appointment with HF clinic for more guided direction her appointment is scheduled for 6/24.  . she states she enjoys PR and believes her shortness of breath has improved during her admission.  Kalea continues to help out at the [employer] and has great joy from this.  We will continue to monitor towards pulmonary rehab goals during the next 30 days.  Making slow progress albeit progress, still dealing with SOB, but able to walk 15 laps on the track in 30 minutes, and level 5 on the nustep.   Making slow progress albeit progress, still dealing with SOB, but able to walk 15 laps on the track in 30 minutes, and level 5 on the nustep. Requires high liter flow of oxygen to 15 liters.  Pt seen by Dr.  Bensimhon who is not convinced she has pulmonary HTN.   Expected Outcomes  see admission expected outcomes.  see admission expected outcomes.  see admission expected outcomes.  see admission expected outcomes.  see admission expected outcomes.   [place] Name 06/08/18 1643             Core Components/Risk Factors/Patient Goals Review   Review  Making slow progress but is quite pleased with herself.  Pt with recent fall/ LOC due to not turning her oxygen tank up while ambulating to her bedroom.  Pt will now be on a double tank to ensure this doesnt happen agina.  Pt still dealing with SOB, but able to walk 15 laps on the track in 30 minutes, and level 5 on the nustep. Requires high liter flow of oxygen to 15 liters.  Pt seen by Dr. Haroldine Laws who is not convinced she has pulmonary HTN.       Expected Outcomes  see admission expected outcomes.          Core Components/Risk Factors/Patient Goals at Discharge (Final Review):  Goals and Risk Factor Review - 06/08/18 1643      Core Components/Risk Factors/Patient Goals Review   Review  Making slow progress but is quite pleased with herself.  Pt with recent fall/ LOC due to not turning her oxygen tank up while ambulating to her bedroom.  Pt will now be on a double tank to ensure this doesnt happen agina.  Pt still dealing with SOB, but able to walk 15 laps on the track in 30 minutes, and level 5 on the nustep. Requires high liter flow of oxygen to 15 liters.  Pt seen by Dr. Haroldine Laws who is not convinced she has pulmonary HTN.    Expected Outcomes  see admission expected outcomes.       ITP Comments: ITP Comments    Row Name 03/23/18 1257 04/22/18 1051 06/08/18 1639       ITP Comments  Dr Jennet Maduro, Medical Director  Dr Jennet Maduro, Medical  Director  Dr Jennet Maduro, Medical Director        Comments:  Pt presently on hold pending follow up from fall secondary to hypoxia.  Pt is due to return on August 6th. She will complete her walk test and graduate with sessions completed.  [name], BSN Cardiac and Training and development officer

## 2018-06-11 NOTE — Telephone Encounter (Signed)
Spoke with Julie-patients daughter. She understands as well as patient that she has O2 ordered per OV notes yesterday. Almyra Free is aware that patient can move up to 10L if needed during daytime. She understands why patient is having to use nasal cannula and mask to reach needed liter flow.    Spoke with Doren Custard is aware that I have spoken with Almyra Free and she is agreeable to have mother get O2.

## 2018-06-15 DIAGNOSIS — E039 Hypothyroidism, unspecified: Secondary | ICD-10-CM | POA: Diagnosis not present

## 2018-06-15 DIAGNOSIS — J9621 Acute and chronic respiratory failure with hypoxia: Secondary | ICD-10-CM | POA: Diagnosis not present

## 2018-06-15 DIAGNOSIS — Z6825 Body mass index (BMI) 25.0-25.9, adult: Secondary | ICD-10-CM | POA: Diagnosis not present

## 2018-06-15 DIAGNOSIS — N3000 Acute cystitis without hematuria: Secondary | ICD-10-CM | POA: Diagnosis not present

## 2018-06-15 DIAGNOSIS — E876 Hypokalemia: Secondary | ICD-10-CM | POA: Diagnosis not present

## 2018-06-15 DIAGNOSIS — R55 Syncope and collapse: Secondary | ICD-10-CM | POA: Diagnosis not present

## 2018-06-15 DIAGNOSIS — I2721 Secondary pulmonary arterial hypertension: Secondary | ICD-10-CM | POA: Diagnosis not present

## 2018-06-15 DIAGNOSIS — N179 Acute kidney failure, unspecified: Secondary | ICD-10-CM | POA: Diagnosis not present

## 2018-06-15 DIAGNOSIS — I5032 Chronic diastolic (congestive) heart failure: Secondary | ICD-10-CM | POA: Diagnosis not present

## 2018-06-15 DIAGNOSIS — I10 Essential (primary) hypertension: Secondary | ICD-10-CM | POA: Diagnosis not present

## 2018-06-15 DIAGNOSIS — J449 Chronic obstructive pulmonary disease, unspecified: Secondary | ICD-10-CM | POA: Diagnosis not present

## 2018-06-16 ENCOUNTER — Encounter (HOSPITAL_COMMUNITY): Payer: Medicare Other

## 2018-06-18 ENCOUNTER — Encounter (HOSPITAL_COMMUNITY): Payer: Medicare Other

## 2018-06-23 ENCOUNTER — Encounter (HOSPITAL_COMMUNITY)
Admission: RE | Admit: 2018-06-23 | Discharge: 2018-06-23 | Disposition: A | Discharge status: Home / Self Care | Payer: Medicare Other | Source: Ambulatory Visit | Attending: Pulmonary Disease | Admitting: Pulmonary Disease

## 2018-06-23 DIAGNOSIS — M1990 Unspecified osteoarthritis, unspecified site: Secondary | ICD-10-CM | POA: Insufficient documentation

## 2018-06-23 DIAGNOSIS — E0590 Thyrotoxicosis, unspecified without thyrotoxic crisis or storm: Secondary | ICD-10-CM | POA: Insufficient documentation

## 2018-06-23 DIAGNOSIS — I440 Atrioventricular block, first degree: Secondary | ICD-10-CM | POA: Insufficient documentation

## 2018-06-23 DIAGNOSIS — Z9889 Other specified postprocedural states: Secondary | ICD-10-CM | POA: Insufficient documentation

## 2018-06-23 DIAGNOSIS — I110 Hypertensive heart disease with heart failure: Secondary | ICD-10-CM | POA: Insufficient documentation

## 2018-06-23 DIAGNOSIS — Z86010 Personal history of colonic polyps: Secondary | ICD-10-CM | POA: Insufficient documentation

## 2018-06-23 DIAGNOSIS — I739 Peripheral vascular disease, unspecified: Secondary | ICD-10-CM | POA: Insufficient documentation

## 2018-06-23 DIAGNOSIS — R0602 Shortness of breath: Secondary | ICD-10-CM | POA: Insufficient documentation

## 2018-06-23 DIAGNOSIS — Z79899 Other long term (current) drug therapy: Secondary | ICD-10-CM | POA: Insufficient documentation

## 2018-06-23 DIAGNOSIS — J8410 Pulmonary fibrosis, unspecified: Secondary | ICD-10-CM | POA: Insufficient documentation

## 2018-06-23 DIAGNOSIS — J449 Chronic obstructive pulmonary disease, unspecified: Secondary | ICD-10-CM | POA: Insufficient documentation

## 2018-06-23 DIAGNOSIS — E785 Hyperlipidemia, unspecified: Secondary | ICD-10-CM | POA: Insufficient documentation

## 2018-06-23 DIAGNOSIS — Z87891 Personal history of nicotine dependence: Secondary | ICD-10-CM | POA: Insufficient documentation

## 2018-06-23 DIAGNOSIS — I5032 Chronic diastolic (congestive) heart failure: Secondary | ICD-10-CM | POA: Insufficient documentation

## 2018-06-23 NOTE — Progress Notes (Signed)
[name] presents to pulmonary rehab for her bi-weekly exercise session. I have completed her thirty day face to face review and determined that [name] is on track for meeting their pulmonary rehab goals. There are no barriers identified that will prevent them from continuing their exercise in pulmonary rehab as prescribed.   Rush Farmer, M.D. [facility] Pulmonary/Critical Care Medicine. Pager: [phone]. After hours pager: [phone].

## 2018-06-24 ENCOUNTER — Encounter (HOSPITAL_COMMUNITY): Payer: Self-pay | Admitting: Emergency Medicine

## 2018-06-24 ENCOUNTER — Other Ambulatory Visit: Payer: Self-pay

## 2018-06-24 ENCOUNTER — Emergency Department (HOSPITAL_COMMUNITY)
Admission: EM | Admit: 2018-06-24 | Discharge: 2018-06-24 | Disposition: A | Discharge status: Home / Self Care | Payer: Medicare Other | Attending: Emergency Medicine | Admitting: Emergency Medicine

## 2018-06-24 ENCOUNTER — Encounter (HOSPITAL_COMMUNITY): Payer: Self-pay | Admitting: *Deleted

## 2018-06-24 DIAGNOSIS — Z79899 Other long term (current) drug therapy: Secondary | ICD-10-CM | POA: Diagnosis not present

## 2018-06-24 DIAGNOSIS — Z7982 Long term (current) use of aspirin: Secondary | ICD-10-CM | POA: Insufficient documentation

## 2018-06-24 DIAGNOSIS — I5032 Chronic diastolic (congestive) heart failure: Secondary | ICD-10-CM | POA: Insufficient documentation

## 2018-06-24 DIAGNOSIS — R040 Epistaxis: Secondary | ICD-10-CM | POA: Diagnosis not present

## 2018-06-24 DIAGNOSIS — Z87891 Personal history of nicotine dependence: Secondary | ICD-10-CM | POA: Diagnosis not present

## 2018-06-24 DIAGNOSIS — J449 Chronic obstructive pulmonary disease, unspecified: Secondary | ICD-10-CM | POA: Diagnosis not present

## 2018-06-24 DIAGNOSIS — Z85828 Personal history of other malignant neoplasm of skin: Secondary | ICD-10-CM | POA: Diagnosis not present

## 2018-06-24 DIAGNOSIS — I110 Hypertensive heart disease with heart failure: Secondary | ICD-10-CM | POA: Diagnosis not present

## 2018-06-24 DIAGNOSIS — I2510 Atherosclerotic heart disease of native coronary artery without angina pectoris: Secondary | ICD-10-CM | POA: Insufficient documentation

## 2018-06-24 LAB — CBC WITH DIFFERENTIAL/PLATELET
Abs Immature Granulocytes: 0 10*3/uL (ref 0.0–0.1)
Basophils Absolute: 0 10*3/uL (ref 0.0–0.1)
Basophils Relative: 0 %
EOS ABS: 0.2 10*3/uL (ref 0.0–0.7)
Eosinophils Relative: 2 %
HEMATOCRIT: 44.5 % (ref 36.0–46.0)
Hemoglobin: 13.5 g/dL (ref 12.0–15.0)
Immature Granulocytes: 0 %
LYMPHS ABS: 1.2 10*3/uL (ref 0.7–4.0)
Lymphocytes Relative: 13 %
MCH: 24.6 pg — AB (ref 26.0–34.0)
MCHC: 30.3 g/dL (ref 30.0–36.0)
MCV: 81.1 fL (ref 78.0–100.0)
MONO ABS: 0.7 10*3/uL (ref 0.1–1.0)
MONOS PCT: 7 %
Neutro Abs: 7.2 10*3/uL (ref 1.7–7.7)
Neutrophils Relative %: 76 %
Platelets: 194 10*3/uL (ref 150–400)
RBC: 5.49 MIL/uL — ABNORMAL HIGH (ref 3.87–5.11)
RDW: 14.6 % (ref 11.5–15.5)
WBC: 9.4 10*3/uL (ref 4.0–10.5)

## 2018-06-24 LAB — BASIC METABOLIC PANEL
ANION GAP: 11 (ref 5–15)
BUN: 24 mg/dL — ABNORMAL HIGH (ref 8–23)
CALCIUM: 9.2 mg/dL (ref 8.9–10.3)
CO2: 27 mmol/L (ref 22–32)
CREATININE: 0.98 mg/dL (ref 0.44–1.00)
Chloride: 102 mmol/L (ref 98–111)
GFR calc Af Amer: 59 mL/min — ABNORMAL LOW (ref >60)
GFR calc non Af Amer: 51 mL/min — ABNORMAL LOW (ref >60)
GLUCOSE: 106 mg/dL — AB (ref 70–99)
Potassium: 3.7 mmol/L (ref 3.5–5.1)
Sodium: 140 mmol/L (ref 135–145)

## 2018-06-24 LAB — PROTIME-INR
INR: 1.08
Prothrombin Time: 13.9 seconds (ref 11.4–15.2)

## 2018-06-24 MED ORDER — OXYMETAZOLINE HCL 0.05 % NA SOLN
1.0000 | Freq: Once | NASAL | Status: AC
  Administered 2018-06-24: 1 via NASAL
  Filled 2018-06-24: qty 15

## 2018-06-24 NOTE — Discharge Instructions (Addendum)
You were evaluated in the emergency department for a nosebleed.  Unfortunately we had to place a nasal balloon and they are to stop the bleeding.  You should continue to use oxygen through the Ventimask.  Please keep well-hydrated.  Call ENT tomorrow to arrange close follow-up.  Return if any worsening symptoms.

## 2018-06-24 NOTE — ED Triage Notes (Addendum)
Pt reports intermittent nose bleed throughout the day but this third time it has not stopped over the last hour. Pt's family reports blood all over furniture, went through a box of kleenex and reports feeling it drip down the back of her throat. Pt uses 13L at home [place] baseline now since previous fall. Denies pain and sob. Pt not on blood thinners. Pt takes 81mg  ASA two times/day. Hx of HTN, hasn't taken evening metoprolol dose.

## 2018-06-24 NOTE — ED Notes (Signed)
Patient verbalizes understanding of discharge instructions. Opportunity for questioning and answers were provided. Armband removed by staff, pt discharged from ED in wheelchair.  

## 2018-06-24 NOTE — ED Provider Notes (Signed)
MSE was initiated and I personally evaluated the patient and placed orders (if any) at  5:44 PM on June 24, 2018.  The patient appears stable so that the remainder of the MSE may be completed by another provider.  Patient placed in Quick Look pathway, seen and evaluated   Chief Complaint: Epistaxis  HPI:   84 year old female past medical history of COPD currently on 3 L of oxygen via nasal cannula at home, takes a daily baby aspirin, presents to ED for evaluation of epistaxis that began today.  Stated that this is her third episode since this morning.  Previous 2 times, she was able to control the bleeding with pressure.  She is unable to control this third episode.  States that she has had blood all over the furniture in her clothing as a result of it.  Family has taken precautions for epistaxis as advised by PCP now that she wears oxygen.  They have been using a humidifier and water in the tank to help with moisture.  Denies any other anticoagulant use, lightheadedness.  ROS: Epistaxis  Physical Exam:   Gen: No distress  Neuro: Awake and Alert  Skin: Warm    Focused Exam: Bleeding noted in bilateral nares. Oxygen delivered via mask.   Initiation of care has begun. The patient has been counseled on the process, plan, and necessity for staying for the completion/evaluation, and the remainder of the medical screening examination    Delia Heady, PA-C 06/24/18 [place], Ankit, MD 06/28/18 1507

## 2018-06-24 NOTE — ED Provider Notes (Signed)
[facility] EMERGENCY DEPARTMENT Provider Note   CSN: [id number] Arrival date & time: 06/24/18  1713     History   Chief Complaint Chief Complaint  Patient presents with  . Epistaxis    HPI [name] is a 84 y.o. female.  She is on nasal cannula oxygen for her COPD and also on baby aspirin.  She is here for nosebleed on and off today.  She states a little bit was from the left but mostly on the right.  After sneezing the nosebleed occurred.  She is been holding pressure but it does not seem to have stopped.  She had one prior episode of nosebleed where she saw ENT but it stopped bleeding by then and she had no intervention.  She is on 15 L via nasal cannula oxygen humidified at home and also uses a humidifier in the room.  The history is provided by the patient.  Epistaxis   This is a recurrent problem. The current episode started 6 to 12 hours ago. The problem occurs constantly. The problem has not changed since onset.Associated with: nasal canula oxygen. The bleeding has been from both nares. She has tried applying pressure for the symptoms. The treatment provided mild relief.    Past Medical History:  Diagnosis Date  . Arthritis   . B12 deficiency   . Basal cell cancer    Nose  . Carotid artery occlusion   . Cataract   . Chronic diastolic CHF (congestive heart failure) ([place])   . Colon polyps   . COPD (chronic obstructive pulmonary disease) ([place])    STAGE 2  . Coronary artery disease    status post angioplasty of the mid RCA in 1995  . Dyspnea   . First degree atrioventricular block   . History of obesity   . Hyperlipidemia   . Hypertension    well controlled  . Hyperthyroidism   . Lichen sclerosus et atrophicus of the vulva   . Osteopenia   . PAD (peripheral artery disease) ([facility])    with stable claudication  . Pulmonary fibrosis ([place])    wears 4L home O2 at baseline    Patient Active Problem List   Diagnosis Date Noted  . Syncope  06/05/2018  . Acute lower UTI 06/05/2018  . Cor pulmonale (chronic) ([place]) 02/10/2018  . Tachycardia 12/27/2016  . Influenza with pneumonia 12/26/2016  . Chronic respiratory failure with hypoxia ([place]) 07/10/2016  . Polycythemia, secondary 07/05/2016  . Pulmonary hypertension ([place]) 02/06/2016  . COPD GOLD II  02/05/2016  . Chronic diastolic CHF (congestive heart failure) ([place]) 11/01/2014  . CHF (congestive heart failure), NYHA class II ([place]) 10/18/2014  . Carotid stenosis 09/21/2014  . Osteopenia 10/27/2013  . Aftercare following surgery of the circulatory system, [place] 09/15/2013  . 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 Vaginal atrophy 05/20/2013  . Peripheral vascular disease ([place]) 02/10/2013  . Occlusion and stenosis of carotid artery without mention of cerebral infarction 09/16/2012  . Pain in limb 09/16/2012  . Hypertension   . Hyperlipidemia   . PAD (peripheral artery disease) ([place])   . Coronary artery disease     Past Surgical History:  Procedure Laterality Date  . CAROTID ENDARTERECTOMY Right 2002  . CATARACT EXTRACTION W/ INTRAOCULAR LENS  IMPLANT, BILATERAL    . CHOLECYSTECTOMY  1999  . ENDARTERECTOMY     right carotid endarterectomy  . KNEE ARTHROSCOPY Left   . MENISCUS REPAIR Left 10/29/2012  . OPEN REDUCTION INTERNAL FIXATION (ORIF)  DISTAL RADIAL FRACTURE Left 11/08/2016   Procedure: OPEN TREATMENT OF LEFT DISTAL RADIUS FRACTURE;  Surgeon: Milly Jakob, MD;  Location: [facility];  Service: Orthopedics;  Laterality: Left;  . REPAIR TENDONS FOOT       OB History    Gravida  3   Para  3   Term      Preterm      AB      Living  3     SAB      TAB      Ectopic      Multiple      Live Births               Home Medications    Prior to Admission medications   Medication Sig Start Date End Date Taking? Authorizing Provider  acetaminophen (TYLENOL) 500 MG tablet Take 1,000 mg by mouth at bedtime.     [provider]  Aloe-Sodium  Chloride (AYR SALINE NASAL GEL NA) Place 1 application into the nose daily as needed (dryness).    [provider]  Celedonio Miyamoto 62.5-25 MCG/INH AEPB INHALE 1 PUFF INTO THE LUNGS DAILY 04/14/18   Rigoberto Noel, MD  aspirin 81 MG tablet Take 81 mg by mouth 2 (two) times daily.     [provider]  Carboxymethylcellul-Glycerin (LUBRICATING EYE DROPS OP) Apply 1 drop to eye daily as needed (dry eyes).    [provider]  denosumab (PROLIA) 60 MG/ML SOLN injection Inject 60 mg into the skin every 6 (six) months. Administer in upper arm, thigh, or abdomen    [provider]  furosemide (LASIX) 20 MG tablet Take 1 tablet (20 mg total) by mouth daily. 02/24/18   [place], Peter M, MD  isosorbide mononitrate (IMDUR) 30 MG 24 hr tablet TAKE 1 TABLET(30 MG) BY MOUTH DAILY 11/21/17   [place], Peter M, MD  levothyroxine (SYNTHROID, LEVOTHROID) 137 MCG tablet Take 137 mcg by mouth daily.      [provider]  metoprolol tartrate (LOPRESSOR) 50 MG tablet Take 1 tablet (50 mg total) by mouth 2 (two) times daily. 01/15/18   [place], Peter M, MD  multivitamin-lutein [facility]) CAPS capsule Take 1 capsule by mouth daily.    [provider]  nitroGLYCERIN (NITROSTAT) 0.4 MG SL tablet Place 1 tablet (0.4 mg total) under the tongue every 5 (five) minutes as needed for chest pain (MAX 3 doses). 06/01/18   [place], Peter M, MD  OXYGEN Inhale 8 L into the lungs daily.     [provider]  Probiotic Product (PROBIOTIC DAILY PO) Take 1 tablet by mouth daily.     [provider]  rosuvastatin (CRESTOR) 40 MG tablet Take 20 mg by mouth at bedtime.     [provider]    Family History Family History  Problem Relation Age of Onset  . Heart attack Father 40  . Hypertension Father   . Heart disease Brother        underwent coronary bypass grafting at age 26  . Hypertension Brother   . Diabetes Sister   . Breast cancer Maternal Aunt     Social  History Social History   Tobacco Use  . Smoking status: Former Smoker    Packs/day: 1.00    Years: 40.00    Pack years: 40.00    Types: Cigarettes    Last attempt to quit: 11/18/1993    Years since quitting: 24.6  . Smokeless tobacco: Never Used  Substance  Use Topics  . Alcohol use: No    Alcohol/week: 0.0 oz  . Drug use: No     Allergies   Tape   Review of Systems Review of Systems  Constitutional: Negative for fever.  HENT: Positive for nosebleeds. Negative for sore throat.   Eyes: Negative for visual disturbance.  Respiratory: Negative for shortness of breath.   Cardiovascular: Negative for chest pain.  Gastrointestinal: Negative for abdominal pain.  Genitourinary: Negative for dysuria.  Musculoskeletal: Negative for neck pain.  Skin: Negative for rash.  Neurological: Negative for headaches.     Physical Exam Updated Vital Signs BP (!) 170/72 (BP Location: Right Arm)   Pulse 85   Temp 97.7 F (36.5 C) (Oral)   Resp 18   Ht 5\' 3"  (1.6 m)   Wt 67.1 kg (148 lb)   SpO2 92%   BMI 26.22 kg/m   Physical Exam  Constitutional: She appears well-developed and well-nourished.  HENT:  Head: Normocephalic and atraumatic.  Nose: No nose lacerations, sinus tenderness or nasal septal hematoma. Epistaxis (right nare) is observed.  No foreign bodies.  Eyes: Pupils are equal, round, and reactive to light. Conjunctivae and EOM are normal.  Neck: Normal range of motion. Neck supple.  Cardiovascular: Normal rate, regular rhythm, normal heart sounds and intact distal pulses.  Pulmonary/Chest: Effort normal and breath sounds normal. She has no wheezes. She has no rales.  Abdominal: Soft. She exhibits no mass. There is no tenderness. There is no guarding.  Neurological: She is alert. GCS eye subscore is 4. GCS verbal subscore is 5. GCS motor subscore is 6.  Skin: Skin is warm and dry.  Psychiatric: She has a normal mood and affect.     ED Treatments / Results  Labs (all  labs ordered are listed, but only abnormal results are displayed) Labs Reviewed  CBC WITH DIFFERENTIAL/PLATELET - Abnormal; Notable for the following components:      Result Value   RBC 5.49 (*)    MCH 24.6 (*)    All other components within normal limits  BASIC METABOLIC PANEL - Abnormal; Notable for the following components:   Glucose, Bld 106 (*)    BUN 24 (*)    GFR calc non Af Amer 51 (*)    GFR calc Af Amer 59 (*)    All other components within normal limits  PROTIME-INR    EKG None  Radiology No results found.  Procedures .Epistaxis Management Date/Time: 06/24/2018 6:51 PM Performed by: Hayden Rasmussen, MD Authorized by: Hayden Rasmussen, MD   Consent:    Consent obtained:  Verbal   Consent given by:  Patient   Risks discussed:  Bleeding, infection, nasal injury and pain   Alternatives discussed:  No treatment Anesthesia (see MAR for exact dosages):    Anesthesia method:  Topical application   Topical anesthetic:  Lidocaine gel Procedure details:    Treatment site:  R anterior   Treatment method:  Nasal balloon   Treatment complexity:  Limited   Treatment episode: initial   Post-procedure details:    Assessment:  Bleeding stopped   Patient tolerance of procedure:  Tolerated well, no immediate complications   (including critical care time)  Medications Ordered in ED Medications  oxymetazoline (AFRIN) 0.05 % nasal spray 1 spray (1 spray Each Nare Given 06/24/18 1741)     Initial Impression / Assessment and Plan / ED Course  I have reviewed the triage vital signs and the nursing notes.  Pertinent labs &  imaging results that were available during my care of the patient were reviewed by me and considered in my medical decision making (see chart for details).  Clinical Course as of Jun 25 936  Wed Jun 24, 2018  1858 Reevaluated and no active bleeding.   [MB]  1940 I had respiratory eval patient as she will need to continue her oxygen, now with a nasal  packing.We tried a Ventimask so that she can continue to get adequate oxygen at home.  She already has an ear nose throat doctor so I recommended that she call them tomorrow for follow-up probably on Friday.  She understands to return if any worsening symptoms.   [MB]    Clinical Course User Index [MB] Hayden Rasmussen, MD     Final Clinical Impressions(s) / ED Diagnoses   Final diagnoses:  Epistaxis    ED Discharge Orders    None       Hayden Rasmussen, MD 06/25/18 [id number]

## 2018-06-24 NOTE — Progress Notes (Signed)
Called to patients room to set her up on a oxygen mask that will work for home while she has her nasal passage packed due to uncontrollable bleeding.. Pt normally wear nasal cannula.  Placed her on 55% venturi mask and explained how it works to the patient and her daughter who remains at bedside both understand.  Patient is tolerating well at this time.

## 2018-06-24 NOTE — ED Notes (Signed)
Pt using suctioning in triage. Pt coughed out 3 inch blood clot also.

## 2018-06-25 ENCOUNTER — Encounter (HOSPITAL_COMMUNITY): Payer: Medicare Other

## 2018-06-26 NOTE — Progress Notes (Signed)
Discharge Progress Report  Patient Details  Name: [name] MRN: [id number] Date of Birth: [birth year] Referring Provider:     Pulmonary Rehab Walk Test from 01/22/2018 in [facility]  Referring Provider  Dr. Elsworth Soho       Number of Visits: 42 graduation date: 06/23/18  Reason for Discharge:  Patient reached a stable level of exercise. Patient has met program and personal goals.  Smoking History:  Social History   Tobacco Use  Smoking Status Former Smoker  . Packs/day: 1.00  . Years: 40.00  . Pack years: 40.00  . Types: Cigarettes  . Last attempt to quit: 11/18/1993  . Years since quitting: 24.6  Smokeless Tobacco Never Used    Diagnosis:  Stage 2 moderate COPD by GOLD classification ([place])  Chronic obstructive pulmonary disease, unspecified COPD type ([place])  ADL [facility]: Pulmonary Assessment Scores    Row Name 01/15/18 1746 01/23/18 0704 06/24/18 1027     ADL [facility]   ADL Phase  Entry  Entry  Exit   SOB Score total  49  -  74     CAT Score   CAT Score  20 Entry  -  23     mMRC Score   mMRC Score  -  0  -   Row Name 06/26/18 1509         ADL [facility]   ADL Phase  Exit       mMRC Score   mMRC Score  0        Initial Exercise Prescription: Initial Exercise Prescription - 01/23/18 0700      Date of Initial Exercise RX and Referring Provider   Date  01/22/18    Referring Provider  Dr. Elsworth Soho      Oxygen   Oxygen  Continuous    Liters  --   10-15     Recumbant Bike   Level  2    Watts  10    Minutes  17      NuStep   Level  2    SPM  80    Minutes  17    METs  1.5      Track   Laps  5    Minutes  17      Prescription Details   Frequency (times per week)  2    Duration  Progress to 45 minutes of aerobic exercise without signs/symptoms of physical distress      Intensity   THRR 40-80% of Max Heartrate  54-107    Ratings of Perceived Exertion  11-13    Perceived Dyspnea  0-4      Progression   Progression  Continue  progressive overload as per policy without signs/symptoms or physical distress.      Resistance Training   Training Prescription  Yes    Weight  orange bands    Reps  10-15       Discharge Exercise Prescription (Final Exercise Prescription Changes): Exercise Prescription Changes - 06/04/18 1124      Response to Exercise   Blood Pressure (Admit)  136/60    Blood Pressure (Exercise)  144/50    Blood Pressure (Exit)  126/70    Heart Rate (Admit)  74 bpm    Heart Rate (Exercise)  95 bpm    Heart Rate (Exit)  86 bpm    Oxygen Saturation (Admit)  97 %    Oxygen Saturation (Exercise)  86 %    Oxygen  Saturation (Exit)  98 %    Rating of Perceived Exertion (Exercise)  15    Perceived Dyspnea (Exercise)  3    Duration  Progress to 45 minutes of aerobic exercise without signs/symptoms of physical distress    Intensity  THRR unchanged      Progression   Progression  Continue to progress workloads to maintain intensity without signs/symptoms of physical distress.      Resistance Training   Training Prescription  Yes    Weight  orange bands    Reps  10-15    Time  10 Minutes      Interval Training   Interval Training  No      Oxygen   Oxygen  Continuous    Liters  15-25 L, 100% NRB      NuStep   Level  5    SPM  80    Minutes  17    METs  2.2      Track   Laps  7    Minutes  17       Functional Capacity: 6 Minute Walk    Row Name 01/23/18 0700 06/26/18 1509       6 Minute Walk   Phase  Initial  Discharge    Distance  800 feet  500 feet    Distance Feet Change  -  -300 ft    Walk Time  - 5 minutes 10 seconds  3 minutes    # of Rest Breaks  1 stopped by ep due to low sat  - stopped walk test at 3 minutes    MPH  1.51  1    METS  2.15  1.77    RPE  13  17    Perceived Dyspnea   2  3    Symptoms  Yes (comment)  Yes (comment)    Comments  used wheelchair and forehead probe-very slow o2 recovery  used wheelchair and forehead probe/no sob or pain/slow recovery     Resting HR  68 bpm  77 bpm    Resting BP  162/78  -    Resting Oxygen Saturation   91 %  100 %    Exercise Oxygen Saturation  during 6 min walk  78 %  65 %    Max Ex. HR  137 bpm  130 bpm    Max Ex. BP  180/64  100/52      Interval HR   1 Minute HR  100  99    2 Minute HR  118  111    3 Minute HR  130  130    4 Minute HR  132  -    5 Minute HR  132  -    6 Minute HR  137  -    2 Minute Post HR  110  -    Interval Heart Rate?  Yes  Yes      Interval Oxygen   Interval Oxygen?  Yes  Yes    Baseline Oxygen Saturation %  91 %  100 %    1 Minute Oxygen Saturation %  95 %  75 %    1 Minute Liters of Oxygen  10 L  25 L    2 Minute Oxygen Saturation %  84 %  65 %    2 Minute Liters of Oxygen  10 L  25 L    3 Minute Oxygen Saturation %  81 %  91 %  3 Minute Liters of Oxygen  15 L  25 L    4 Minute Oxygen Saturation %  85 % rest break by EP  -    4 Minute Liters of Oxygen  15 L  -    5 Minute Oxygen Saturation %  85 %  -    5 Minute Liters of Oxygen  15 L  -    6 Minute Oxygen Saturation %  78 %  -    6 Minute Liters of Oxygen  15 L  -    2 Minute Post Oxygen Saturation %  82 % did not recover to 91% until 4 minutes post 70mt  -    2 Minute Post Liters of Oxygen  10 L  -       Psychological, QOL, Others - Outcomes: PHQ 2/9: Depression screen [facility] 06/23/2018 01/12/2018  Decreased Interest 0 0  Down, Depressed, Hopeless 1 0  PHQ - 2 Score 1 0  Altered sleeping 0 -  Tired, decreased energy 0 -  Change in appetite 1 -  Feeling bad or failure about yourself  0 -  Trouble concentrating 0 -  Moving slowly or fidgety/restless 0 -  Suicidal thoughts 0 -  PHQ-9 Score 2 -  Difficult doing work/chores Not difficult at all -    Quality of Life:   Personal Goals: Goals established at orientation with interventions provided to work toward goal. Personal Goals and Risk Factors at Admission - 01/12/18 1456      Core Components/Risk Factors/Patient Goals on Admission   Improve  shortness of breath with ADL's  Yes    Intervention  Provide education, individualized exercise plan and daily activity instruction to help decrease symptoms of SOB with activities of daily living.    Expected Outcomes  Short Term: Improve cardiorespiratory fitness to achieve a reduction of symptoms when performing ADLs;Long Term: Be able to perform more ADLs without symptoms or delay the onset of symptoms        Personal Goals Discharge: Goals and Risk Factor Review    Row Name 02/23/18 1105 03/23/18 1302 04/20/18 1635 05/18/18 1441 06/08/18 1641     Core Components/Risk Factors/Patient Goals Review   Personal Goals Review  Improve shortness of breath with ADL's  Improve shortness of breath with ADL's;Develop more efficient breathing techniques such as purse lipped breathing and diaphragmatic breathing and practicing self-pacing with activity.  Improve shortness of breath with ADL's;Develop more efficient breathing techniques such as purse lipped breathing and diaphragmatic breathing and practicing self-pacing with activity.  Improve shortness of breath with ADL's  Improve shortness of breath with ADL's   Review  patient got off to a slow start in pulmonary rehab related to her inability to stay oxygenated with the maximum amount of oxygen we could supply to her. in collaboration with her pulmonologist it was decided that she would not walk during rehab related to continued desaturations below adjusted parameters on maximum flow. patient does tolerated seated exercise and increased workloads on equiptment. she has not seen an improvement in her shortness of breath as of yet. will continue to monitor and hope to see progression towards improvement in shortness of breath over the next 30 days.  MAnzalis doing OK in pulmonary rehab. she was seen by our medical director during the last 30 days and it was identified that the patient was in need of a HF consult for her PMystic she subsiquently saw her primary  pulmonologist who facilitated this  referral and patient is scheduled to see Dr. Haroldine Laws in June. she remains on 100% NRB for exercise however if she is not walking we maybe able to do a trial of O2 using a venti mask at 50%. During ambulation she continues to desaturate significantly unless she is on 25L however our current order is only for 15. will wait until patient has appointment with HF clinic for more guided direction. she states she enjoys PR and believes her shortness of breath has improved during her admission. she has had a minor incidences of epitaxis that has prohibited her from exercise. That has been resolved with a visit to the ENT and directions on nasal hydration and use of Afrin for bleeding. will continued to monitor towards pulmonary rehab goals during the next 30 days.  Kristeen is doing good in pulmonary rehab. she was seen by our medical director during the last 30 days and it was identified that the patient was in need of a HF consult for her [place]. she subsiquently saw her primary pulmonologist who facilitated this referral and patient saw Dr. Haroldine Laws in June. she remains on 100% NRB for exercise however if she is not walking we maybe able to do a trial of O2 using a venti mask at 50%. During ambulation she continues to desaturate significantly unless she is on 25L however our current order is only for 15. will wait until patient has appointment with HF clinic for more guided direction her appointment is scheduled for 6/24.  . she states she enjoys PR and believes her shortness of breath has improved during her admission.  Kellyn continues to help out at the [employer] and has great joy from this.  We will continue to monitor towards pulmonary rehab goals during the next 30 days.  Making slow progress albeit progress, still dealing with SOB, but able to walk 15 laps on the track in 30 minutes, and level 5 on the nustep.  Making slow progress albeit progress, still dealing with SOB, but able  to walk 15 laps on the track in 30 minutes, and level 5 on the nustep. Requires high liter flow of oxygen to 15 liters.  Pt seen by Dr. Haroldine Laws who is not convinced she has pulmonary HTN.   Expected Outcomes  see admission expected outcomes.  see admission expected outcomes.  see admission expected outcomes.  see admission expected outcomes.  see admission expected outcomes.   [place] Name 06/08/18 1643 06/26/18 1610           Core Components/Risk Factors/Patient Goals Review   Personal Goals Review  -  Improve shortness of breath with ADL's      Review  Making slow progress but is quite pleased with herself.  Pt with recent fall/ LOC due to not turning her oxygen tank up while ambulating to her bedroom.  Pt will now be on a double tank to ensure this doesnt happen agina.  Pt still dealing with SOB, but able to walk 15 laps on the track in 30 minutes, and level 5 on the nustep. Requires high liter flow of oxygen to 15 liters.  Pt seen by Dr. Haroldine Laws who is not convinced she has pulmonary HTN.  Pt graduates from the pulmonary rehab program with the completion of 34 exercise sessions.  Noted decline in her post walk test due to recent hospitalization for fall at home.       Expected Outcomes  see admission expected outcomes.  Pt has met acheivable admission expected  outcomes within the limitations of her lung disease         Exercise Goals and Review: Exercise Goals    Row Name 01/12/18 1437             Exercise Goals   Increase Physical Activity  Yes       Intervention  Provide advice, education, support and counseling about physical activity/exercise needs.;Develop an individualized exercise prescription for aerobic and resistive training based on initial evaluation findings, risk stratification, comorbidities and participant's personal goals.       Expected Outcomes  Short Term: Attend rehab on a regular basis to increase amount of physical activity.;Long Term: Add in home exercise to make  exercise part of routine and to increase amount of physical activity.;Long Term: Exercising regularly at least 3-5 days a week.       Increase Strength and Stamina  Yes       Intervention  Provide advice, education, support and counseling about physical activity/exercise needs.;Develop an individualized exercise prescription for aerobic and resistive training based on initial evaluation findings, risk stratification, comorbidities and participant's personal goals.       Expected Outcomes  Short Term: Increase workloads from initial exercise prescription for resistance, speed, and METs.;Short Term: Perform resistance training exercises routinely during rehab and add in resistance training at home;Long Term: Improve cardiorespiratory fitness, muscular endurance and strength as measured by increased METs and functional capacity (6MWT)       Able to understand and use rate of perceived exertion (RPE) scale  Yes       Intervention  Provide education and explanation on how to use RPE scale       Expected Outcomes  Short Term: Able to use RPE daily in rehab to express subjective intensity level;Long Term:  Able to use RPE to guide intensity level when exercising independently       Able to understand and use Dyspnea scale  Yes       Intervention  Provide education and explanation on how to use Dyspnea scale       Expected Outcomes  Short Term: Able to use Dyspnea scale daily in rehab to express subjective sense of shortness of breath during exertion;Long Term: Able to use Dyspnea scale to guide intensity level when exercising independently       Knowledge and understanding of Target Heart Rate Range (THRR)  Yes       Intervention  Provide education and explanation of THRR including how the numbers were predicted and where they are located for reference       Expected Outcomes  Short Term: Able to state/look up THRR;Short Term: Able to use daily as guideline for intensity in rehab;Long Term: Able to use THRR to  govern intensity when exercising independently       Understanding of Exercise Prescription  Yes       Intervention  Provide education, explanation, and written materials on patient's individual exercise prescription       Expected Outcomes  Short Term: Able to explain program exercise prescription;Long Term: Able to explain home exercise prescription to exercise independently          Nutrition & Weight - Outcomes: Pre Biometrics - 01/12/18 1459      Pre Biometrics   Grip Strength  30 kg        Nutrition: Nutrition Therapy & Goals - 02/12/18 1224      Nutrition Therapy   Diet  Heart Healthy  Personal Nutrition Goals   Nutrition Goal  Describe the benefit of including fruits, vegetables, whole grains, and low-fat dairy products in a healthy meal plan.      Intervention Plan   Intervention  Prescribe, educate and counsel regarding individualized specific dietary modifications aiming towards targeted core components such as weight, hypertension, lipid management, diabetes, heart failure and other comorbidities.    Expected Outcomes  Short Term Goal: Understand basic principles of dietary content, such as calories, fat, sodium, cholesterol and nutrients.;Long Term Goal: Adherence to prescribed nutrition plan.       Nutrition Discharge: Nutrition Assessments - 06/25/18 1348      Rate Your Plate Scores   Pre Score  58    Post Score  27       Education Questionnaire Score: Knowledge Questionnaire Score - 06/24/18 1027      Knowledge Questionnaire Score   Post Score  16/18       Goals reviewed with patient Maurice Small RN, BSN Cardiac and Pulmonary Rehab Nurse Navigator

## 2018-06-29 DIAGNOSIS — R040 Epistaxis: Secondary | ICD-10-CM | POA: Diagnosis not present

## 2018-06-30 ENCOUNTER — Encounter (HOSPITAL_COMMUNITY): Payer: Medicare Other

## 2018-07-02 ENCOUNTER — Encounter (HOSPITAL_COMMUNITY): Payer: Medicare Other

## 2018-07-07 ENCOUNTER — Encounter (HOSPITAL_COMMUNITY): Payer: Medicare Other

## 2018-07-08 ENCOUNTER — Telehealth: Payer: Self-pay | Admitting: Pulmonary Disease

## 2018-07-08 ENCOUNTER — Other Ambulatory Visit: Payer: Self-pay

## 2018-07-08 ENCOUNTER — Inpatient Hospital Stay (HOSPITAL_COMMUNITY): Payer: Medicare Other

## 2018-07-08 ENCOUNTER — Ambulatory Visit (INDEPENDENT_AMBULATORY_CARE_PROVIDER_SITE_OTHER): Payer: Medicare Other | Admitting: Pulmonary Disease

## 2018-07-08 ENCOUNTER — Encounter: Payer: Self-pay | Admitting: Pulmonary Disease

## 2018-07-08 ENCOUNTER — Inpatient Hospital Stay (HOSPITAL_COMMUNITY)
Admission: AD | Admit: 2018-07-08 | Discharge: 2018-07-15 | DRG: 190 | Disposition: A | Discharge status: Home Health | Payer: Medicare Other | Source: Ambulatory Visit | Attending: Pulmonary Disease | Admitting: Pulmonary Disease

## 2018-07-08 DIAGNOSIS — I110 Hypertensive heart disease with heart failure: Secondary | ICD-10-CM | POA: Diagnosis present

## 2018-07-08 DIAGNOSIS — I272 Pulmonary hypertension, unspecified: Secondary | ICD-10-CM

## 2018-07-08 DIAGNOSIS — Q211 Atrial septal defect: Secondary | ICD-10-CM

## 2018-07-08 DIAGNOSIS — J441 Chronic obstructive pulmonary disease with (acute) exacerbation: Principal | ICD-10-CM | POA: Diagnosis present

## 2018-07-08 DIAGNOSIS — Z9981 Dependence on supplemental oxygen: Secondary | ICD-10-CM | POA: Diagnosis not present

## 2018-07-08 DIAGNOSIS — J9621 Acute and chronic respiratory failure with hypoxia: Secondary | ICD-10-CM | POA: Diagnosis not present

## 2018-07-08 DIAGNOSIS — J8410 Pulmonary fibrosis, unspecified: Secondary | ICD-10-CM | POA: Diagnosis present

## 2018-07-08 DIAGNOSIS — I2729 Other secondary pulmonary hypertension: Secondary | ICD-10-CM | POA: Diagnosis present

## 2018-07-08 DIAGNOSIS — I2510 Atherosclerotic heart disease of native coronary artery without angina pectoris: Secondary | ICD-10-CM | POA: Diagnosis present

## 2018-07-08 DIAGNOSIS — Z85828 Personal history of other malignant neoplasm of skin: Secondary | ICD-10-CM

## 2018-07-08 DIAGNOSIS — E785 Hyperlipidemia, unspecified: Secondary | ICD-10-CM | POA: Diagnosis present

## 2018-07-08 DIAGNOSIS — J449 Chronic obstructive pulmonary disease, unspecified: Secondary | ICD-10-CM | POA: Diagnosis not present

## 2018-07-08 DIAGNOSIS — I440 Atrioventricular block, first degree: Secondary | ICD-10-CM | POA: Diagnosis present

## 2018-07-08 DIAGNOSIS — D751 Secondary polycythemia: Secondary | ICD-10-CM | POA: Diagnosis present

## 2018-07-08 DIAGNOSIS — Z66 Do not resuscitate: Secondary | ICD-10-CM | POA: Diagnosis present

## 2018-07-08 DIAGNOSIS — R0609 Other forms of dyspnea: Secondary | ICD-10-CM | POA: Diagnosis not present

## 2018-07-08 DIAGNOSIS — Z7982 Long term (current) use of aspirin: Secondary | ICD-10-CM

## 2018-07-08 DIAGNOSIS — J189 Pneumonia, unspecified organism: Secondary | ICD-10-CM | POA: Diagnosis present

## 2018-07-08 DIAGNOSIS — I5032 Chronic diastolic (congestive) heart failure: Secondary | ICD-10-CM

## 2018-07-08 DIAGNOSIS — Z87891 Personal history of nicotine dependence: Secondary | ICD-10-CM

## 2018-07-08 DIAGNOSIS — J9611 Chronic respiratory failure with hypoxia: Secondary | ICD-10-CM

## 2018-07-08 DIAGNOSIS — Z79899 Other long term (current) drug therapy: Secondary | ICD-10-CM | POA: Diagnosis not present

## 2018-07-08 DIAGNOSIS — I5082 Biventricular heart failure: Secondary | ICD-10-CM | POA: Diagnosis present

## 2018-07-08 DIAGNOSIS — I739 Peripheral vascular disease, unspecified: Secondary | ICD-10-CM | POA: Diagnosis present

## 2018-07-08 DIAGNOSIS — I779 Disorder of arteries and arterioles, unspecified: Secondary | ICD-10-CM

## 2018-07-08 DIAGNOSIS — R0600 Dyspnea, unspecified: Secondary | ICD-10-CM | POA: Diagnosis present

## 2018-07-08 DIAGNOSIS — I2781 Cor pulmonale (chronic): Secondary | ICD-10-CM | POA: Diagnosis present

## 2018-07-08 DIAGNOSIS — R918 Other nonspecific abnormal finding of lung field: Secondary | ICD-10-CM | POA: Diagnosis not present

## 2018-07-08 LAB — COMPREHENSIVE METABOLIC PANEL
ALT: 16 U/L (ref 0–44)
AST: 22 U/L (ref 15–41)
Albumin: 3.5 g/dL (ref 3.5–5.0)
Alkaline Phosphatase: 59 U/L (ref 38–126)
Anion gap: 6 (ref 5–15)
BILIRUBIN TOTAL: 0.8 mg/dL (ref 0.3–1.2)
BUN: 22 mg/dL (ref 8–23)
CHLORIDE: 104 mmol/L (ref 98–111)
CO2: 32 mmol/L (ref 22–32)
CREATININE: 0.92 mg/dL (ref 0.44–1.00)
Calcium: 9 mg/dL (ref 8.9–10.3)
GFR calc Af Amer: >60 mL/min (ref >60)
GFR, EST NON AFRICAN AMERICAN: 54 mL/min — AB (ref >60)
Glucose, Bld: 97 mg/dL (ref 70–99)
POTASSIUM: 3.4 mmol/L — AB (ref 3.5–5.1)
Sodium: 142 mmol/L (ref 135–145)
Total Protein: 6.8 g/dL (ref 6.5–8.1)

## 2018-07-08 LAB — CBC
HEMATOCRIT: 41 % (ref 36.0–46.0)
Hemoglobin: 12.6 g/dL (ref 12.0–15.0)
MCH: 24.5 pg — ABNORMAL LOW (ref 26.0–34.0)
MCHC: 30.7 g/dL (ref 30.0–36.0)
MCV: 79.8 fL (ref 78.0–100.0)
Platelets: 170 10*3/uL (ref 150–400)
RBC: 5.14 MIL/uL — ABNORMAL HIGH (ref 3.87–5.11)
RDW: 15.1 % (ref 11.5–15.5)
WBC: 8.9 10*3/uL (ref 4.0–10.5)

## 2018-07-08 MED ORDER — IPRATROPIUM-ALBUTEROL 0.5-2.5 (3) MG/3ML IN SOLN: 3.0000 mL | RESPIRATORY_TRACT | Status: DC | PRN

## 2018-07-08 MED ORDER — ISOSORBIDE MONONITRATE ER 30 MG PO TB24
30.0000 mg | ORAL_TABLET | Freq: Every day | ORAL | Status: DC
  Administered 2018-07-09 – 2018-07-15 (×7): 30 mg via ORAL
  Filled 2018-07-08 (×7): qty 1

## 2018-07-08 MED ORDER — FUROSEMIDE 10 MG/ML IJ SOLN
40.0000 mg | Freq: Two times a day (BID) | INTRAMUSCULAR | Status: DC
  Administered 2018-07-08 – 2018-07-11 (×6): 40 mg via INTRAVENOUS
  Filled 2018-07-08 (×6): qty 4

## 2018-07-08 MED ORDER — OCUVITE-LUTEIN PO CAPS: 1.0000 | ORAL_CAPSULE | Freq: Every day | ORAL | Status: DC

## 2018-07-08 MED ORDER — DOCUSATE SODIUM 100 MG PO CAPS
100.0000 mg | ORAL_CAPSULE | Freq: Every day | ORAL | Status: DC
  Administered 2018-07-09 – 2018-07-14 (×4): 100 mg via ORAL
  Filled 2018-07-08 (×7): qty 1

## 2018-07-08 MED ORDER — ONDANSETRON HCL 4 MG/2ML IJ SOLN: 4.0000 mg | Freq: Four times a day (QID) | INTRAMUSCULAR | Status: DC | PRN

## 2018-07-08 MED ORDER — CARBOXYMETHYLCELLUL-GLYCERIN 0.5-0.9 % OP SOLN: 1.0000 [drp] | Freq: Every day | OPHTHALMIC | Status: DC | PRN

## 2018-07-08 MED ORDER — HEPARIN SODIUM (PORCINE) 5000 UNIT/ML IJ SOLN
5000.0000 [IU] | Freq: Three times a day (TID) | INTRAMUSCULAR | Status: DC
  Administered 2018-07-08 – 2018-07-15 (×20): 5000 [IU] via SUBCUTANEOUS
  Filled 2018-07-08 (×22): qty 1

## 2018-07-08 MED ORDER — DOCUSATE SODIUM 100 MG PO CAPS: 100.0000 mg | ORAL_CAPSULE | Freq: Every day | ORAL | Status: DC

## 2018-07-08 MED ORDER — AYR SALINE NASAL GEL NA SWAB: Freq: Every day | NASAL | Status: DC | PRN

## 2018-07-08 MED ORDER — UMECLIDINIUM-VILANTEROL 62.5-25 MCG/INH IN AEPB
1.0000 | INHALATION_SPRAY | Freq: Every day | RESPIRATORY_TRACT | Status: DC
  Administered 2018-07-09 – 2018-07-15 (×7): 1 via RESPIRATORY_TRACT
  Filled 2018-07-08 (×2): qty 14

## 2018-07-08 MED ORDER — ACETAMINOPHEN 325 MG PO TABS
650.0000 mg | ORAL_TABLET | Freq: Four times a day (QID) | ORAL | Status: DC | PRN
  Administered 2018-07-08 – 2018-07-14 (×6): 650 mg via ORAL
  Filled 2018-07-08 (×6): qty 2

## 2018-07-08 MED ORDER — ROSUVASTATIN CALCIUM 20 MG PO TABS
20.0000 mg | ORAL_TABLET | Freq: Every day | ORAL | Status: DC
  Administered 2018-07-08 – 2018-07-14 (×7): 20 mg via ORAL
  Filled 2018-07-08 (×7): qty 1

## 2018-07-08 MED ORDER — POTASSIUM CHLORIDE CRYS ER 20 MEQ PO TBCR
40.0000 meq | EXTENDED_RELEASE_TABLET | Freq: Once | ORAL | Status: AC
  Administered 2018-07-08: 40 meq via ORAL
  Filled 2018-07-08: qty 2

## 2018-07-08 MED ORDER — METOPROLOL TARTRATE 50 MG PO TABS
50.0000 mg | ORAL_TABLET | Freq: Two times a day (BID) | ORAL | Status: DC
  Administered 2018-07-08 – 2018-07-15 (×14): 50 mg via ORAL
  Filled 2018-07-08 (×14): qty 1

## 2018-07-08 MED ORDER — SODIUM CHLORIDE 0.9 % IV SOLN: 250.0000 mL | INTRAVENOUS | Status: DC | PRN

## 2018-07-08 MED ORDER — ISOSORBIDE MONONITRATE ER 30 MG PO TB24: 30.0000 mg | ORAL_TABLET | Freq: Every day | ORAL | Status: DC

## 2018-07-08 MED ORDER — POLYVINYL ALCOHOL 1.4 % OP SOLN
1.0000 [drp] | OPHTHALMIC | Status: DC | PRN
  Filled 2018-07-08: qty 15

## 2018-07-08 MED ORDER — RISAQUAD PO CAPS
1.0000 | ORAL_CAPSULE | Freq: Every day | ORAL | Status: DC
  Administered 2018-07-08 – 2018-07-14 (×7): 1 via ORAL
  Filled 2018-07-08 (×7): qty 1

## 2018-07-08 MED ORDER — ASPIRIN EC 81 MG PO TBEC
81.0000 mg | DELAYED_RELEASE_TABLET | Freq: Two times a day (BID) | ORAL | Status: DC
  Administered 2018-07-08 – 2018-07-15 (×14): 81 mg via ORAL
  Filled 2018-07-08 (×14): qty 1

## 2018-07-08 MED ORDER — SALINE SPRAY 0.65 % NA SOLN: 1.0000 | NASAL | Status: DC | PRN

## 2018-07-08 MED ORDER — PROSIGHT PO TABS
1.0000 | ORAL_TABLET | Freq: Every day | ORAL | Status: DC
  Administered 2018-07-09 – 2018-07-15 (×7): 1 via ORAL
  Filled 2018-07-08 (×7): qty 1

## 2018-07-08 MED ORDER — UMECLIDINIUM-VILANTEROL 62.5-25 MCG/INH IN AEPB
1.0000 | INHALATION_SPRAY | Freq: Every day | RESPIRATORY_TRACT | Status: DC
  Filled 2018-07-08: qty 14

## 2018-07-08 MED ORDER — LEVOTHYROXINE SODIUM 25 MCG PO TABS
137.0000 ug | ORAL_TABLET | Freq: Every day | ORAL | Status: DC
  Administered 2018-07-09 – 2018-07-15 (×7): 137 ug via ORAL
  Filled 2018-07-08 (×7): qty 1

## 2018-07-08 MED ORDER — PROSIGHT PO TABS: 1.0000 | ORAL_TABLET | Freq: Every day | ORAL | Status: DC

## 2018-07-08 NOTE — Assessment & Plan Note (Signed)
Worsening pulmonary hypertension with right to left shunt has demonstrated on bubble study. Unfortunately no response to tadalafil and not a candidate for other pulmonary vasodilator therapies

## 2018-07-08 NOTE — Assessment & Plan Note (Signed)
Will place pleuritic once hospitalized and diuresed 40 mg Lasix every 12 and see how much improvement we get in 48 hours with hypoxia DNR status will be maintained

## 2018-07-08 NOTE — Telephone Encounter (Signed)
Orders have been placed by NP

## 2018-07-08 NOTE — Telephone Encounter (Signed)
Attempted to contact Abby at [facility]. I did not receive an answer. There was no option for me to leave a message. Will try back.

## 2018-07-08 NOTE — Assessment & Plan Note (Signed)
I feel her increasing hypoxia is due to opening up of the right left shunt and unfortunately she is not a candidate for pulmonary vasodilator therapies. We will admit her to the hospital for trial of IV diuretics She is willing for this. If this does not work then she is agreeable for hospice

## 2018-07-08 NOTE — Telephone Encounter (Signed)
Called spoke with Abby RN who verified that orders have been placed Nothing further needed; will sign off

## 2018-07-08 NOTE — Telephone Encounter (Signed)
Will route message to RA.

## 2018-07-08 NOTE — H&P (Signed)
Name: [name] MRN: [id number] DOB: 19-Mar-1929    ADMISSION DATE:  07/08/2018  HISTORY OF PRESENT ILLNESS:  84 yo former smoker followed for COPD on home O2with severepulmonary HTN  Hx of polycythemia followed by hematology  Has D CHF and CAD([name] )    episodes of syncope, last such episode inmarch2019.  She was evaluated by the CHF service and tadalafil was discontinued  She is continued to require increasing amounts of oxygen now on 10 L and had to drop out of pulmonary rehab.  She had to stop volunteering at the hospital. Today she is brought in by her daughter in a wheelchair, she had an episode of epistaxis and was seen by ENT and required nasal packing, this was attributed to high flow oxygen and drying out of her nasal passages.  She has increasing pedal edema can hardly walk a few steps without severe desaturations     Significant tests/ events  Chest x-ray shows increased interstitial prominence compared to her older films from 2009 HRCT 01/2016 >> no evidence of interstitial lung disease, mild bibasal scarring  PFTs 01/2016 -FEV1 1.15 - 64%, no BD response, DLCO 22%  Echo2/2017- PAP 64, gr 2 DD, nml LV fn Echo 01/2018( bubble study)>. PFO with R--> L shunt  PAST MEDICAL HISTORY :   has a past medical history of Arthritis, B12 deficiency, Basal cell cancer, Carotid artery occlusion, Cataract, Chronic diastolic CHF (congestive heart failure) ([place]), Colon polyps, COPD (chronic obstructive pulmonary disease) ([place]), Coronary artery disease, Dyspnea, First degree atrioventricular block, History of obesity, Hyperlipidemia, Hypertension, Hyperthyroidism, Lichen sclerosus et atrophicus of the vulva, Osteopenia, PAD (peripheral artery disease) ([place]), and Pulmonary fibrosis ([place]).  has a past surgical history that includes Endarterectomy; Cholecystectomy (1999); Repair tendons foot; Meniscus repair (Left, 10/29/2012); Carotid endarterectomy (Right, 2002);  Knee arthroscopy (Left); Cataract extraction w/ intraocular lens  implant, bilateral; and Open reduction internal fixation (orif) distal radial fracture (Left, 11/08/2016). Prior to Admission medications   Medication Sig Start Date End Date Taking? Authorizing Provider  acetaminophen (TYLENOL) 500 MG tablet Take 1,000 mg by mouth at bedtime.    Yes [provider]  Aloe-Sodium Chloride (AYR SALINE NASAL GEL NA) Place 1 application into the nose daily as needed (dryness).   Yes [provider]  ANORO ELLIPTA 62.5-25 MCG/INH AEPB INHALE 1 PUFF INTO THE LUNGS DAILY Patient taking differently: Inhale 1 puff into the lungs daily.  04/14/18  Yes Rigoberto Noel, MD  aspirin 81 MG tablet Take 81 mg by mouth 2 (two) times daily.    Yes [provider]  Carboxymethylcellul-Glycerin (LUBRICATING EYE DROPS OP) Apply 1 drop to eye daily as needed (dry eyes).   Yes [provider]  docusate sodium (COLACE) 100 MG capsule Take 100 mg by mouth daily.   Yes [provider]  furosemide (LASIX) 20 MG tablet Take 1 tablet (20 mg total) by mouth daily. 02/24/18  Yes [place]ique, Peter M, MD  isosorbide mononitrate (IMDUR) 30 MG 24 hr tablet TAKE 1 TABLET(30 MG) BY MOUTH DAILY 11/21/17  Yes [place], Peter M, MD  levothyroxine (SYNTHROID, LEVOTHROID) 137 MCG tablet Take 137 mcg by mouth daily.     Yes [provider]  metoprolol tartrate (LOPRESSOR) 50 MG tablet Take 1 tablet (50 mg total) by mouth 2 (two) times daily. 01/15/18  Yes [place], Peter M, MD  multivitamin-lutein [facility]) CAPS capsule Take 1 capsule by mouth daily.   Yes [provider]  OXYGEN Inhale 8 L  into the lungs daily.    Yes [provider]  Probiotic Product (PROBIOTIC DAILY PO) Take 1 tablet by mouth at bedtime.    Yes [provider]  rosuvastatin (CRESTOR) 40 MG tablet Take 20 mg by mouth at bedtime.    Yes [provider]  denosumab (PROLIA) 60 MG/ML SOLN  injection Inject 60 mg into the skin every 6 (six) months. Administer in upper arm, thigh, or abdomen    [provider]  nitroGLYCERIN (NITROSTAT) 0.4 MG SL tablet Place 1 tablet (0.4 mg total) under the tongue every 5 (five) minutes as needed for chest pain (MAX 3 doses). 06/01/18   [place], Peter M, MD   Allergies  Allergen Reactions  . Tape Other (See Comments)    Pulls skin    FAMILY HISTORY:  family history includes Breast cancer in her maternal aunt; Diabetes in her sister; Heart attack (age of onset: 62) in her father; Heart disease in her brother; Hypertension in her brother and father. SOCIAL HISTORY:  reports that she quit smoking about 24 years ago. Her smoking use included cigarettes. She has a 40.00 pack-year smoking history. She has never used smokeless tobacco. She reports that she does not drink alcohol or use drugs.  REVIEW OF SYSTEMS:   Positive for increased dyspnea on exertion going back of energy, worsening pedal edema  Constitutional: Negative for fever, chills, weight loss, malaise/fatigue and diaphoresis.  HENT: Negative for hearing loss, ear pain,  congestion, sore throat, neck pain, tinnitus and ear discharge.   Eyes: Negative for blurred vision, double vision, photophobia, pain, discharge and redness.  Respiratory: Negative for cough, hemoptysis, sputum production,  wheezing and stridor.   Cardiovascular: Negative for chest pain, palpitations, orthopnea, claudication, Gastrointestinal: Negative for heartburn, nausea, vomiting, abdominal pain, diarrhea, constipation, blood in stool and melena.  Genitourinary: Negative for dysuria, urgency, frequency, hematuria and flank pain.  Musculoskeletal: Negative for myalgias, back pain, joint pain and falls.  Skin: Negative for itching and rash.  Neurological: Negative for dizziness, tingling, tremors, sensory change, speech change, focal weakness, seizures, loss of consciousness, weakness and headaches.    Endo/Heme/Allergies: Negative for environmental allergies and polydipsia. Does not bruise/bleed easily.  SUBJECTIVE:   VITAL SIGNS: Temp:  [98.2 F (36.8 C)] 98.2 F (36.8 C) (08/21 1022) Pulse Rate:  [57-67] 66 (08/21 1511) Resp:  [16-18] 18 (08/21 1511) BP: (112-162)/(63-74) 142/63 (08/21 1511) SpO2:  [89 %-98 %] 98 % (08/21 1511) Weight:  [63.5 kg-67.5 kg] 67.5 kg (08/21 1022)  PHYSICAL EXAMINATION: Gen. Pleasant, well-nourished, elderly,in wheelchair, mild distres ENT - no lesions, no post nasal drip Neck: No JVD, no thyromegaly, no carotid bruits Lungs: mild use of accessory muscles, no dullness to percussion, clear without rales or rhonchi  Cardiovascular: Rhythm regular, heart sounds  normal, no murmurs or gallops, 2+ peripheral edema Abdomen: soft and non-tender, no hepatosplenomegaly, BS normal. Musculoskeletal: No deformities, no cyanosis or clubbing Neuro:  alert, non focal   Recent Labs  Lab 07/08/18 1256  NA 142  K 3.4*  CL 104  CO2 32  BUN 22  CREATININE 0.92  GLUCOSE 97   Recent Labs  Lab 07/08/18 1256  HGB 12.6  HCT 41.0  WBC 8.9  PLT 170   Dg Chest Port 1 View  Result Date: 07/08/2018 CLINICAL DATA:  Dyspnea.  COPD. EXAM: PORTABLE CHEST 1 VIEW COMPARISON:  06/05/2018 FINDINGS: Cardiac enlargement without heart failure or edema. Atherosclerotic aortic arch. Mild bibasilar airspace disease with mild progression in the  interval. Small pleural effusions bilaterally. IMPRESSION: COPD with progression of mild bibasilar airspace disease which may be atelectasis or pneumonia. Electronically Signed   By: Franchot Gallo M.D.   On: 07/08/2018 13:12    ASSESSMENT / PLAN:  Acute on chronic hypoxic respiratory failure Worsening pulmonary hypertension with right to left shunt has demonstrated on bubble study. Unfortunately no response to tadalafil and not a candidate for other pulmonary vasodilator therapies.  Chronic diastolic heart failure  -[place] has  deteriorated significantly in the last few months.  To the point where she is not able to function anymore. I feel her increasing hypoxia is due to opening up of the right left shunt and unfortunately she is not a candidate for pulmonary vasodilator therapies. We will admit her to the hospital for trial of IV diuretics , will place Foley/purewick and try Lasix 40 every 12 She is willing for this. If this does not work then she is agreeable for hospice    Kara Mead MD. FCCP. [facility] Pager [phone] If no response call [phone]   07/08/2018   07/08/2018, 4:46 PM

## 2018-07-08 NOTE — Patient Instructions (Signed)
Hospital admission for trial of IV diuretics

## 2018-07-08 NOTE — Progress Notes (Signed)
Pt admitted for [facility] for acute on chronic pulmonary failure. Pt requiring 15 L, 90-93%. Pt orientated to unit and informed how to call for help. No questions or concerns from the pt at this time.

## 2018-07-08 NOTE — Telephone Encounter (Signed)
Abby, RN [facility]W calling for an update on orders. Cb is [phone].

## 2018-07-08 NOTE — Progress Notes (Signed)
   Subjective:    Patient ID: [name], female    DOB: [birth year], 84 y.o.   MRN: [id number]  HPI  84 yo former smoker followed for COPD on home O2 with severepulmonary HTN  Hx of polycythemia followed by hematology  Has D CHF and CAD([name] )    episodes of syncope, last such episode in march 2019.  She was evaluated by the CHF service and tadalafil was discontinued  She is continued to require increasing amounts of oxygen now on 10 L and had to drop out of pulmonary rehab.  She had to stop volunteering at the hospital. Today she is brought in by her daughter in a wheelchair, she had an episode of epistaxis and was seen by ENT and required nasal packing, this was attributed to high flow oxygen and drying out of her nasal passages.  She has increasing pedal edema can hardly walk a few steps without severe desaturations     Significant tests/ events  Chest x-ray shows increased interstitial prominence compared to her older films from 2009 HRCT 01/2016 >> no evidence of interstitial lung disease, mild bibasal scarring  PFTs 01/2016 -FEV1 1.15 - 64%, no BD response, DLCO 22%  Echo2/2017- PAP 64, gr 2 DD, nml LV fn Echo 01/2018 ( bubble study)>. PFO with R--> L shunt  Review of Systems neg for any significant sore throat, dysphagia, itching, sneezing, nasal congestion or excess/ purulent secretions, fever, chills, sweats, unintended wt loss, pleuritic or exertional cp, hempoptysis, orthopnea pnd or change in chronic leg swelling.   Also denies presyncope, palpitations, heartburn, abdominal pain, nausea, vomiting, diarrhea or change in bowel or urinary habits, dysuria,hematuria, rash, arthralgias, visual complaints, headache, numbness weakness or ataxia.     Objective:   Physical Exam  Gen. Pleasant, well-nourished, in no distress ENT - no thrush, no post nasal drip Neck: No JVD, no thyromegaly, no carotid bruits Lungs: no use of accessory muscles, no dullness to  percussion, clear without rales or rhonchi  Cardiovascular: Rhythm regular, heart sounds  normal, no murmurs or gallops, 2+ peripheral edema Musculoskeletal: No deformities, no cyanosis or clubbing        Assessment & Plan:

## 2018-07-09 ENCOUNTER — Telehealth: Payer: Self-pay | Admitting: Pulmonary Disease

## 2018-07-09 ENCOUNTER — Encounter (HOSPITAL_COMMUNITY): Payer: Self-pay | Admitting: Oncology

## 2018-07-09 DIAGNOSIS — I272 Pulmonary hypertension, unspecified: Secondary | ICD-10-CM

## 2018-07-09 DIAGNOSIS — R0609 Other forms of dyspnea: Secondary | ICD-10-CM

## 2018-07-09 DIAGNOSIS — J449 Chronic obstructive pulmonary disease, unspecified: Secondary | ICD-10-CM

## 2018-07-09 LAB — CBC
HEMATOCRIT: 38.6 % (ref 36.0–46.0)
HEMOGLOBIN: 11.9 g/dL — AB (ref 12.0–15.0)
MCH: 24.4 pg — ABNORMAL LOW (ref 26.0–34.0)
MCHC: 30.8 g/dL (ref 30.0–36.0)
MCV: 79.1 fL (ref 78.0–100.0)
Platelets: 146 10*3/uL — ABNORMAL LOW (ref 150–400)
RBC: 4.88 MIL/uL (ref 3.87–5.11)
RDW: 15.1 % (ref 11.5–15.5)
WBC: 6.3 10*3/uL (ref 4.0–10.5)

## 2018-07-09 LAB — BASIC METABOLIC PANEL
ANION GAP: 10 (ref 5–15)
BUN: 24 mg/dL — ABNORMAL HIGH (ref 8–23)
CALCIUM: 9.2 mg/dL (ref 8.9–10.3)
CHLORIDE: 105 mmol/L (ref 98–111)
CO2: 29 mmol/L (ref 22–32)
Creatinine, Ser: 1.11 mg/dL — ABNORMAL HIGH (ref 0.44–1.00)
GFR calc Af Amer: 50 mL/min — ABNORMAL LOW (ref >60)
GFR calc non Af Amer: 43 mL/min — ABNORMAL LOW (ref >60)
GLUCOSE: 99 mg/dL (ref 70–99)
POTASSIUM: 3.8 mmol/L (ref 3.5–5.1)
Sodium: 144 mmol/L (ref 135–145)

## 2018-07-09 LAB — MAGNESIUM: Magnesium: 1.8 mg/dL (ref 1.7–2.4)

## 2018-07-09 MED ORDER — DOXYCYCLINE HYCLATE 100 MG PO TABS
100.0000 mg | ORAL_TABLET | Freq: Two times a day (BID) | ORAL | Status: DC
  Administered 2018-07-09 – 2018-07-15 (×13): 100 mg via ORAL
  Filled 2018-07-09 (×13): qty 1

## 2018-07-09 MED ORDER — GUAIFENESIN ER 600 MG PO TB12
600.0000 mg | ORAL_TABLET | Freq: Two times a day (BID) | ORAL | Status: DC
  Administered 2018-07-09 – 2018-07-15 (×13): 600 mg via ORAL
  Filled 2018-07-09 (×13): qty 1

## 2018-07-09 NOTE — Telephone Encounter (Signed)
Patient's daughter, Almyra Free asked that we call her once the referral has been placed.

## 2018-07-09 NOTE — Telephone Encounter (Signed)
RB (DOD) Please advise if you are okay with giving referral to [facility] for Pulmonary HTN. Pt is still currently admitted to [facility] hospital.   RA is out of the office until 08-03-18. Thanks.

## 2018-07-09 NOTE — Telephone Encounter (Signed)
Called and spoke to pt's daughter, Almyra Free [facility]) Almyra Free is aware of below recommendations and voiced her understanding.  I have made Almyra Free aware that RA is out of office until 08/03/18.   RA please advise. Thanks

## 2018-07-09 NOTE — Progress Notes (Signed)
Name: [name] MRN: [id number] DOB: July 20, 1929    ADMISSION DATE:  07/08/2018  CHIEF COMPLAINT:  Shortness of breath  History of present illness: Patient with COPD on home oxygen Severe pulmonary hypertension Polycythemia Diastolic congestive heart failure  Admitted with progressive symptoms for diuresis  She had been on tadalafil recently and this was discontinued following having a syncopal episode. She has had increasing pedal edema and increased oxygen requirements   SIGNIFICANT EVENTS  Chest x-ray shows increased interstitial prominence compared to her older films from 2009 HRCT 01/2016 >> no evidence of interstitial lung disease, mild bibasal scarring  PFTs 01/2016 -FEV1 1.15 - 64%, no BD response, DLCO 22%  Echo2/2017- PAP 64, gr 2 DD, nml LV fn Echo 01/2018( bubble study)>. PFO with R--> L shunt  STUDIES:  Chest x-ray during this admission does show slight worsening of basilar infiltrative process  PAST MEDICAL HISTORY :   has a past medical history of Arthritis, B12 deficiency, Basal cell cancer, Carotid artery occlusion, Cataract, Chronic diastolic CHF (congestive heart failure) ([place]), Colon polyps, COPD (chronic obstructive pulmonary disease) ([place]), Coronary artery disease, Dyspnea, First degree atrioventricular block, History of obesity, Hyperlipidemia, Hypertension, Hyperthyroidism, Lichen sclerosus et atrophicus of the vulva, Osteopenia, PAD (peripheral artery disease) ([place]), and Pulmonary fibrosis ([place]).  has a past surgical history that includes Endarterectomy; Cholecystectomy (1999); Repair tendons foot; Meniscus repair (Left, 10/29/2012); Carotid endarterectomy (Right, 2002); Knee arthroscopy (Left); Cataract extraction w/ intraocular lens  implant, bilateral; and Open reduction internal fixation (orif) distal radial fracture (Left, 11/08/2016). Prior to Admission medications   Medication Sig Start Date End Date Taking? Authorizing Provider    acetaminophen (TYLENOL) 500 MG tablet Take 1,000 mg by mouth at bedtime.    Yes [provider]  Aloe-Sodium Chloride (AYR SALINE NASAL GEL NA) Place 1 application into the nose daily as needed (dryness).   Yes [provider]  ANORO ELLIPTA 62.5-25 MCG/INH AEPB INHALE 1 PUFF INTO THE LUNGS DAILY Patient taking differently: Inhale 1 puff into the lungs daily.  04/14/18  Yes Rigoberto Noel, MD  aspirin 81 MG tablet Take 81 mg by mouth 2 (two) times daily.    Yes [provider]  Carboxymethylcellul-Glycerin (LUBRICATING EYE DROPS OP) Apply 1 drop to eye daily as needed (dry eyes).   Yes [provider]  docusate sodium (COLACE) 100 MG capsule Take 100 mg by mouth daily.   Yes [provider]  furosemide (LASIX) 20 MG tablet Take 1 tablet (20 mg total) by mouth daily. 02/24/18  Yes [place], Peter M, MD  isosorbide mononitrate (IMDUR) 30 MG 24 hr tablet TAKE 1 TABLET(30 MG) BY MOUTH DAILY 11/21/17  Yes [place], Peter M, MD  levothyroxine (SYNTHROID, LEVOTHROID) 137 MCG tablet Take 137 mcg by mouth daily.     Yes [provider]  metoprolol tartrate (LOPRESSOR) 50 MG tablet Take 1 tablet (50 mg total) by mouth 2 (two) times daily. 01/15/18  Yes [place], Peter M, MD  multivitamin-lutein [facility]) CAPS capsule Take 1 capsule by mouth daily.   Yes [provider]  OXYGEN Inhale 8 L into the lungs daily.    Yes [provider]  Probiotic Product (PROBIOTIC DAILY PO) Take 1 tablet by mouth at bedtime.    Yes [provider]  rosuvastatin (CRESTOR) 40 MG tablet Take 20 mg by mouth at bedtime.    Yes [provider]  denosumab (PROLIA) 60 MG/ML SOLN injection Inject 60 mg into the skin every 6 (six)  months. Administer in upper arm, thigh, or abdomen    [provider]  nitroGLYCERIN (NITROSTAT) 0.4 MG SL tablet Place 1 tablet (0.4 mg total) under the tongue every 5 (five) minutes as needed for chest pain (MAX 3  doses). 06/01/18   [place], Peter M, MD   Allergies  Allergen Reactions  . Tape Other (See Comments)    Pulls skin    FAMILY HISTORY:  family history includes Breast cancer in her maternal aunt; Diabetes in her sister; Heart attack (age of onset: 15) in her father; Heart disease in her brother; Hypertension in her brother and father. SOCIAL HISTORY:  reports that she quit smoking about 24 years ago. Her smoking use included cigarettes. She has a 40.00 pack-year smoking history. She has never used smokeless tobacco. She reports that she does not drink alcohol or use drugs.  REVIEW OF SYSTEMS:   Constitutional: Negative for fever, chills HENT: Did have a nosebleed recently  Eyes: Negative for blurred vision, double vision, photophobia, pain, discharge and redness.  Respiratory: Chronic cough, productive of thick phlegm   Cardiovascular: Negative for chest pain no orthopnea, does have leg swelling Gastrointestinal: Negative for heartburn, nausea, vomiting, abdominal pain, diarrhea, constipation, blood in stool and melena.  Genitourinary: Negative for dysuria, urgency, frequency, hematuria and flank pain.  Musculoskeletal: Denies any back pain or joint pain  Skin: Negative for itching and rash.  Neurological: Negative for dizziness, tingling, tremors, sensory change, speech change, focal weakness, seizures, loss of consciousness, weakness and headaches.  Endo/Heme/Allergies: Negative for environmental allergies and polydipsia. Does not bruise/bleed easily.  SUBJECTIVE:   VITAL SIGNS: Temp:  [97.9 F (36.6 C)-98.2 F (36.8 C)] 97.9 F (36.6 C) (08/22 0540) Pulse Rate:  [66-72] 66 (08/22 0540) Resp:  [18] 18 (08/22 0540) BP: (130-157)/(63-77) 130/65 (08/22 0540) SpO2:  [95 %-98 %] 98 % (08/22 0540) Weight:  [67.4 kg] 67.4 kg (08/22 0540)  PHYSICAL EXAMINATION: General: Elderly lady comfortable Neuro: Alert oriented x3 moving all extremities HEENT: Moist oral mucosa Cardiovascular:  S1-S2 appreciated Lungs: Few rales at the bases Abdomen: Bowel sounds appreciated with no organomegaly Musculoskeletal: No deformities Skin: Warm and dry  Recent Labs  Lab 07/08/18 1256 07/09/18 0433  NA 142 144  K 3.4* 3.8  CL 104 105  CO2 32 29  BUN 22 24*  CREATININE 0.92 1.11*  GLUCOSE 97 99   Recent Labs  Lab 07/08/18 1256 07/09/18 0433  HGB 12.6 11.9*  HCT 41.0 38.6  WBC 8.9 6.3  PLT 170 146*   Dg Chest Port 1 View  Result Date: 07/08/2018 CLINICAL DATA:  Dyspnea.  COPD. EXAM: PORTABLE CHEST 1 VIEW COMPARISON:  06/05/2018 FINDINGS: Cardiac enlargement without heart failure or edema. Atherosclerotic aortic arch. Mild bibasilar airspace disease with mild progression in the interval. Small pleural effusions bilaterally. IMPRESSION: COPD with progression of mild bibasilar airspace disease which may be atelectasis or pneumonia. Electronically Signed   By: Franchot Gallo M.D.   On: 07/08/2018 13:12    ASSESSMENT / PLAN:  .  Acute on chronic hypoxemic respiratory failure  .  Severe pulmonary hypertension  .  Diastolic heart failure  .  Possible pneumonia  .  COPD exacerbation  .  A significant deterioration recently is of concern, still requires very high flow oxygen A progressive deterioration is concerning for an end-stage disease process  She remains very feisty and, feels a little bit better today We will continue lines of care We will add a course of doxycycline  Monitor electrolytes closely--especially her BUN/creatinine--with diuresis, I&O does not reveal significant diuresis yet, weight only changed by 0.1kg  Discussed with patient, discussed with son at bedside  [facility] [place] Pager: [phone]  07/09/2018, 11:31 AM

## 2018-07-09 NOTE — Telephone Encounter (Signed)
After reviewing the chart, not clear to me that there is any benefit or intervention that can be made for targeted [place] therapy (which would be the reason to see Dr Marijean Bravo). I think they should make this decision with Dr Elsworth Soho, discuss the pros / cons with him.

## 2018-07-09 NOTE — Plan of Care (Signed)
Pt still requiring 15 LPM of O2

## 2018-07-10 LAB — BASIC METABOLIC PANEL
ANION GAP: 9 (ref 5–15)
BUN: 29 mg/dL — ABNORMAL HIGH (ref 8–23)
CO2: 31 mmol/L (ref 22–32)
Calcium: 9.2 mg/dL (ref 8.9–10.3)
Chloride: 102 mmol/L (ref 98–111)
Creatinine, Ser: 1.02 mg/dL — ABNORMAL HIGH (ref 0.44–1.00)
GFR calc Af Amer: 56 mL/min — ABNORMAL LOW (ref >60)
GFR, EST NON AFRICAN AMERICAN: 48 mL/min — AB (ref >60)
GLUCOSE: 102 mg/dL — AB (ref 70–99)
Potassium: 3.4 mmol/L — ABNORMAL LOW (ref 3.5–5.1)
Sodium: 142 mmol/L (ref 135–145)

## 2018-07-10 NOTE — Progress Notes (Signed)
Name: [name] MRN: [id number] DOB: 05-24-1929    ADMISSION DATE:  07/08/2018  CHIEF COMPLAINT:  Shortness of breath  History of present illness: Patient with COPD on home oxygen Severe pulmonary hypertension Diastolic congestive heart failure   She was admitted to the hospital with progressive shortness of breath, primarily for diuresis  SIGNIFICANT EVENTS  Chest x-ray shows increased interstitial prominence compared to her older films from 2009 HRCT 01/2016 >> no evidence of interstitial lung disease, mild bibasal scarring  PFTs 01/2016 -FEV1 1.15 - 64%, no BD response, DLCO 22%  Echo2/2017- PAP 64, gr 2 DD, nml LV fn Echo 01/2018( bubble study)>. PFO with R--> L shunt  STUDIES:  Chest x-ray during this admission does show slight worsening of basilar infiltrative process  PAST MEDICAL HISTORY :   has a past medical history of Arthritis, B12 deficiency, Basal cell cancer, Carotid artery occlusion, Cataract, Chronic diastolic CHF (congestive heart failure) ([place]), Colon polyps, COPD (chronic obstructive pulmonary disease) ([place]), Coronary artery disease, Dyspnea, First degree atrioventricular block, History of obesity, Hyperlipidemia, Hypertension, Hyperthyroidism, Lichen sclerosus et atrophicus of the vulva, Osteopenia, PAD (peripheral artery disease) ([place]), and Pulmonary fibrosis ([place]).  has a past surgical history that includes Endarterectomy; Cholecystectomy (1999); Repair tendons foot; Meniscus repair (Left, 10/29/2012); Carotid endarterectomy (Right, 2002); Knee arthroscopy (Left); Cataract extraction w/ intraocular lens  implant, bilateral; and Open reduction internal fixation (orif) distal radial fracture (Left, 11/08/2016). Prior to Admission medications   Medication Sig Start Date End Date Taking? Authorizing Provider  acetaminophen (TYLENOL) 500 MG tablet Take 1,000 mg by mouth at bedtime.    Yes [provider]  Aloe-Sodium Chloride (AYR SALINE NASAL GEL  NA) Place 1 application into the nose daily as needed (dryness).   Yes [provider]  ANORO ELLIPTA 62.5-25 MCG/INH AEPB INHALE 1 PUFF INTO THE LUNGS DAILY Patient taking differently: Inhale 1 puff into the lungs daily.  04/14/18  Yes Rigoberto Noel, MD  aspirin 81 MG tablet Take 81 mg by mouth 2 (two) times daily.    Yes [provider]  Carboxymethylcellul-Glycerin (LUBRICATING EYE DROPS OP) Apply 1 drop to eye daily as needed (dry eyes).   Yes [provider]  docusate sodium (COLACE) 100 MG capsule Take 100 mg by mouth daily.   Yes [provider]  furosemide (LASIX) 20 MG tablet Take 1 tablet (20 mg total) by mouth daily. 02/24/18  Yes [place]ique, Peter M, MD  isosorbide mononitrate (IMDUR) 30 MG 24 hr tablet TAKE 1 TABLET(30 MG) BY MOUTH DAILY 11/21/17  Yes [place], Peter M, MD  levothyroxine (SYNTHROID, LEVOTHROID) 137 MCG tablet Take 137 mcg by mouth daily.     Yes [provider]  metoprolol tartrate (LOPRESSOR) 50 MG tablet Take 1 tablet (50 mg total) by mouth 2 (two) times daily. 01/15/18  Yes [place], Peter M, MD  multivitamin-lutein [facility]) CAPS capsule Take 1 capsule by mouth daily.   Yes [provider]  OXYGEN Inhale 8 L into the lungs daily.    Yes [provider]  Probiotic Product (PROBIOTIC DAILY PO) Take 1 tablet by mouth at bedtime.    Yes [provider]  rosuvastatin (CRESTOR) 40 MG tablet Take 20 mg by mouth at bedtime.    Yes [provider]  denosumab (PROLIA) 60 MG/ML SOLN injection Inject 60 mg into the skin every 6 (six) months. Administer in upper arm, thigh, or abdomen    [provider]  nitroGLYCERIN (NITROSTAT) 0.4 MG SL  tablet Place 1 tablet (0.4 mg total) under the tongue every 5 (five) minutes as needed for chest pain (MAX 3 doses). 06/01/18   [place], Peter M, MD   Allergies  Allergen Reactions  . Tape Other (See Comments)    Pulls skin    FAMILY HISTORY:  family  history includes Breast cancer in her maternal aunt; Diabetes in her sister; Heart attack (age of onset: 47) in her father; Heart disease in her brother; Hypertension in her brother and father. SOCIAL HISTORY:  reports that she quit smoking about 24 years ago. Her smoking use included cigarettes. She has a 40.00 pack-year smoking history. She has never used smokeless tobacco. She reports that she does not drink alcohol or use drugs.  REVIEW OF SYSTEMS:   Constitutional: Negative for fever, chills HENT: Did have a nosebleed recently  Eyes: No visual blurring Respiratory: Chronic cough, productive of thick phlegm, slightly better   Cardiovascular: Negative for chest pain no orthopnea, does have leg swelling Gastrointestinal: Negative for heartburn, nausea, vomiting, abdominal pain, diarrhea, constipation, blood in stool and melena.  Genitourinary: Negative for dysuria, urgency, frequency, hematuria and flank pain.  Musculoskeletal: Denies any back pain or joint pain  Skin: Negative for itching and rash.  Neurological: Denies dizziness or any focal weakness.  Endo/Heme/Allergies: Negative for environmental allergies and polydipsia. Does not bruise/bleed easily.  SUBJECTIVE:   VITAL SIGNS: Temp:  [97.8 F (36.6 C)-97.9 F (36.6 C)] 97.9 F (36.6 C) (08/23 1407) Pulse Rate:  [70-73] 70 (08/23 1407) Resp:  [16-18] 16 (08/23 1407) BP: (116-148)/(52-71) 116/52 (08/23 1407) SpO2:  [88 %-95 %] 95 % (08/23 1407) Weight:  [65.1 kg] 65.1 kg (08/23 0555)  PHYSICAL EXAMINATION: General: More comfortable today Neuro: Alert and moving all extremities HEENT: Moist oral mucosa Cardiovascular: [place] appreciated Lungs: Rales at the bases Abdomen: No organomegaly, no tenderness Musculoskeletal: No deformities Skin: Warm and dry, improving leg edema  Recent Labs  Lab 07/08/18 1256 07/09/18 0433 07/10/18 0439  NA 142 144 142  K 3.4* 3.8 3.4*  CL 104 105 102  CO2 32 29 31  BUN 22 24* 29*    CREATININE 0.92 1.11* 1.02*  GLUCOSE 97 99 102*   Recent Labs  Lab 07/08/18 1256 07/09/18 0433  HGB 12.6 11.9*  HCT 41.0 38.6  WBC 8.9 6.3  PLT 170 146*   No results found.  ASSESSMENT / PLAN:  .  Acute on chronic hypoxemic respiratory failure -Still requiring 15 L of oxygen  .  Severe pulmonary hypertension -Secondary to diastolic heart failure  .  Diastolic heart failure -Chronic diagnoses -Optimizing treatment  .  Possible pneumonia -Started on doxycycline Doxycycline 07/09/2018>>  .  COPD exacerbation  .  A significant deterioration recently is of concern, still requires very high flow oxygen A progressive deterioration is concerning for an end-stage disease process   Did speak at length with patient and her daughter today-they are seeking a second opinion with respect of any further medications may help Patient also was able to clarify that tadalafil was stopped primarily because she felt it was not working She did not feel it was responsible for her fall previously  Continue doxycycline Monitor electrolytes closely She did diurese a little bit more  Discussed with patient, discussed with daughter at bedside  Pulmonary and [place] Pager: [phone]  07/10/2018, 5:53 PM

## 2018-07-10 NOTE — Care Management Note (Signed)
Case Management Note  Patient Details  Name: [name] MRN: [id number] Date of Birth: [birth year]  Subjective/Objective:    Pt admitted with Dyspnea                Action/Plan:plan to discharge home   Expected Discharge Date:                  Expected Discharge Plan:  Home/Self Care  In-House Referral:     Discharge planning Services  CM Consult  Post Acute Care Choice:    Choice offered to:     DME Arranged:    DME Agency:     HH Arranged:    [place] Agency:     Status of Service:  In process, will continue to follow  If discussed at Long Length of Stay Meetings, dates discussed:    Additional CommentsPurcell Mouton, RN 07/10/2018, 11:11 AM

## 2018-07-11 ENCOUNTER — Encounter (HOSPITAL_COMMUNITY): Payer: Self-pay

## 2018-07-11 MED ORDER — FUROSEMIDE 40 MG PO TABS
40.0000 mg | ORAL_TABLET | Freq: Two times a day (BID) | ORAL | Status: DC
  Administered 2018-07-11 – 2018-07-12 (×2): 40 mg via ORAL
  Filled 2018-07-11 (×2): qty 1

## 2018-07-11 MED ORDER — POTASSIUM CHLORIDE CRYS ER 20 MEQ PO TBCR
20.0000 meq | EXTENDED_RELEASE_TABLET | Freq: Every day | ORAL | Status: DC
  Administered 2018-07-11 – 2018-07-15 (×5): 20 meq via ORAL
  Filled 2018-07-11 (×5): qty 1

## 2018-07-11 NOTE — Progress Notes (Signed)
Name: [name] MRN: [id number] DOB: Dec 14, 1928    ADMISSION DATE:  07/08/2018  CHIEF COMPLAINT:  Shortness of breath  History of present illness: Patient with COPD on home oxygen Severe pulmonary hypertension Diastolic congestive heart failure   She was admitted to the hospital with progressive shortness of breath, primarily for diuresis  SIGNIFICANT EVENTS  Chest x-ray shows increased interstitial prominence compared to her older films from 2009 HRCT 01/2016 >> no evidence of interstitial lung disease, mild bibasal scarring  PFTs 01/2016 -FEV1 1.15 - 64%, no BD response, DLCO 22%  Echo2/2017- PAP 64, gr 2 DD, nml LV fn Echo 01/2018( bubble study)>. PFO with R--> L shunt  07/11/18 states she feels stable, slight improvement in symptoms She has not been moving around so she does not get very short of breath Cramping in her legs is better  STUDIES:  Chest x-ray during this admission does show slight worsening of basilar infiltrative process  PAST MEDICAL HISTORY :   has a past medical history of Arthritis, B12 deficiency, Basal cell cancer, Carotid artery occlusion, Cataract, Chronic diastolic CHF (congestive heart failure) ([place]), Colon polyps, COPD (chronic obstructive pulmonary disease) ([place]), Coronary artery disease, Dyspnea, First degree atrioventricular block, History of obesity, Hyperlipidemia, Hypertension, Hyperthyroidism, Lichen sclerosus et atrophicus of the vulva, Osteopenia, PAD (peripheral artery disease) ([place]), and Pulmonary fibrosis ([place]).  has a past surgical history that includes Endarterectomy; Cholecystectomy (1999); Repair tendons foot; Meniscus repair (Left, 10/29/2012); Carotid endarterectomy (Right, 2002); Knee arthroscopy (Left); Cataract extraction w/ intraocular lens  implant, bilateral; and Open reduction internal fixation (orif) distal radial fracture (Left, 11/08/2016). Prior to Admission medications   Medication Sig Start Date End Date Taking?  Authorizing Provider  acetaminophen (TYLENOL) 500 MG tablet Take 1,000 mg by mouth at bedtime.    Yes [provider]  Aloe-Sodium Chloride (AYR SALINE NASAL GEL NA) Place 1 application into the nose daily as needed (dryness).   Yes [provider]  ANORO ELLIPTA 62.5-25 MCG/INH AEPB INHALE 1 PUFF INTO THE LUNGS DAILY Patient taking differently: Inhale 1 puff into the lungs daily.  04/14/18  Yes Rigoberto Noel, MD  aspirin 81 MG tablet Take 81 mg by mouth 2 (two) times daily.    Yes [provider]  Carboxymethylcellul-Glycerin (LUBRICATING EYE DROPS OP) Apply 1 drop to eye daily as needed (dry eyes).   Yes [provider]  docusate sodium (COLACE) 100 MG capsule Take 100 mg by mouth daily.   Yes [provider]  furosemide (LASIX) 20 MG tablet Take 1 tablet (20 mg total) by mouth daily. 02/24/18  Yes [place], Peter M, MD  isosorbide mononitrate (IMDUR) 30 MG 24 hr tablet TAKE 1 TABLET(30 MG) BY MOUTH DAILY 11/21/17  Yes [place], Peter M, MD  levothyroxine (SYNTHROID, LEVOTHROID) 137 MCG tablet Take 137 mcg by mouth daily.     Yes [provider]  metoprolol tartrate (LOPRESSOR) 50 MG tablet Take 1 tablet (50 mg total) by mouth 2 (two) times daily. 01/15/18  Yes [place], Peter M, MD  multivitamin-lutein [facility]) CAPS capsule Take 1 capsule by mouth daily.   Yes [provider]  OXYGEN Inhale 8 L into the lungs daily.    Yes [provider]  Probiotic Product (PROBIOTIC DAILY PO) Take 1 tablet by mouth at bedtime.    Yes [provider]  rosuvastatin (CRESTOR) 40 MG tablet Take 20 mg by mouth at bedtime.    Yes [provider]  denosumab (PROLIA) 60 MG/ML  SOLN injection Inject 60 mg into the skin every 6 (six) months. Administer in upper arm, thigh, or abdomen    [provider]  nitroGLYCERIN (NITROSTAT) 0.4 MG SL tablet Place 1 tablet (0.4 mg total) under the tongue every 5 (five) minutes as needed for  chest pain (MAX 3 doses). 06/01/18   [place], Peter M, MD   Allergies  Allergen Reactions  . Tape Other (See Comments)    Pulls skin    FAMILY HISTORY:  family history includes Breast cancer in her maternal aunt; Diabetes in her sister; Heart attack (age of onset: 45) in her father; Heart disease in her brother; Hypertension in her brother and father. SOCIAL HISTORY:  reports that she quit smoking about 24 years ago. Her smoking use included cigarettes. She has a 40.00 pack-year smoking history. She has never used smokeless tobacco. She reports that she does not drink alcohol or use drugs.  REVIEW OF SYSTEMS:   Constitutional: Negative for fever, chills Respiratory: Phlegm production is better, still has a cough Cardiovascular: Negative for chest pain no orthopnea, does have leg swelling Neurological: Denies dizziness or any focal weakness.   SUBJECTIVE:  Feels generally better Still has shortness of breath  VITAL SIGNS: Temp:  [97.6 F (36.4 C)-97.9 F (36.6 C)] 97.6 F (36.4 C) (08/24 0433) Pulse Rate:  [70-71] 71 (08/24 0433) Resp:  [16-18] 18 (08/24 0433) BP: (116-128)/(52-69) 128/62 (08/24 0433) SpO2:  [93 %-97 %] 94 % (08/24 0902) Weight:  [64.9 kg-66 kg] 64.9 kg (08/24 0743)  PHYSICAL EXAMINATION: General: Comfortable Neuro: Moving all extremities HEENT: Moist oral mucosa Cardiovascular: S1-S2 appreciated Lungs: Bilateral rales at the bases Abdomen: Bowel sounds appreciated Musculoskeletal: No deformities, improving leg edema Skin: Skin is warm and dry  Recent Labs  Lab 07/08/18 1256 07/09/18 0433 07/10/18 0439  NA 142 144 142  K 3.4* 3.8 3.4*  CL 104 105 102  CO2 32 29 31  BUN 22 24* 29*  CREATININE 0.92 1.11* 1.02*  GLUCOSE 97 99 102*   Recent Labs  Lab 07/08/18 1256 07/09/18 0433  HGB 12.6 11.9*  HCT 41.0 38.6  WBC 8.9 6.3  PLT 170 146*   ASSESSMENT / PLAN:  .  Acute on chronic hypoxemic respiratory failure -Still requiring 15 L of  oxygen -Breathing feels more comfortable  .  Severe pulmonary hypertension -Secondary to diastolic heart failure  .  Diastolic heart failure -Continue to optimize diuretics  .  Possible community acquired pneumonia -On doxycycline 07/09/2018>> -Plan is for 7 days of treatment  .  COPD exacerbation -Appears to be stabilizing  Following discussions with patient and her daughter yesterday Plan is to continue to optimize her current care  Once deemed more stable She will follow-up with obtaining a second opinion  She is well aware that this may be an end-stage process Encouraged to continue along current course I will follow closely  Patient also was able to clarify that tadalafil was stopped primarily because she felt it was not working She did not feel it was responsible for her fall previously   Continue doxycycline Monitor electrolytes Cautious diuresis-switch to oral Lasix  Pulmonary and [place] Pager: [phone]  07/11/2018, 9:42 AM

## 2018-07-12 ENCOUNTER — Encounter (HOSPITAL_COMMUNITY): Payer: Self-pay | Admitting: *Deleted

## 2018-07-12 LAB — BASIC METABOLIC PANEL
ANION GAP: 11 (ref 5–15)
BUN: 38 mg/dL — ABNORMAL HIGH (ref 8–23)
CALCIUM: 9.1 mg/dL (ref 8.9–10.3)
CO2: 30 mmol/L (ref 22–32)
Chloride: 101 mmol/L (ref 98–111)
Creatinine, Ser: 1.26 mg/dL — ABNORMAL HIGH (ref 0.44–1.00)
GFR calc Af Amer: 43 mL/min — ABNORMAL LOW (ref >60)
GFR, EST NON AFRICAN AMERICAN: 37 mL/min — AB (ref >60)
GLUCOSE: 103 mg/dL — AB (ref 70–99)
Potassium: 3.7 mmol/L (ref 3.5–5.1)
Sodium: 142 mmol/L (ref 135–145)

## 2018-07-12 MED ORDER — FUROSEMIDE 40 MG PO TABS
40.0000 mg | ORAL_TABLET | Freq: Every day | ORAL | Status: DC
  Administered 2018-07-13 – 2018-07-15 (×3): 40 mg via ORAL
  Filled 2018-07-12 (×3): qty 1

## 2018-07-12 NOTE — Plan of Care (Signed)
VSS, no concerns or complaints voiced on 7 a to 7 p shift.  Maintains oxygen saturation in the 90's on 15 liters (per patient 14 liters at home as baseline).  Daughters in to visit this shift.

## 2018-07-12 NOTE — Progress Notes (Signed)
Name: [name] MRN: [id number] DOB: 12-09-1928    ADMISSION DATE:  07/08/2018  CHIEF COMPLAINT:  Shortness of breath  History of present illness: Patient with COPD on home oxygen Severe pulmonary hypertension Diastolic congestive heart failure  07/12/2018 She stated she felt very good last afternoon Feeling okay this morning as well   She was admitted to the hospital with progressive shortness of breath, primarily for diuresis  SIGNIFICANT EVENTS  Chest x-ray shows increased interstitial prominence compared to her older films from 2009 HRCT 01/2016 >> no evidence of interstitial lung disease, mild bibasal scarring  PFTs 01/2016 -FEV1 1.15 - 64%, no BD response, DLCO 22%  Echo2/2017- PAP 64, gr 2 DD, nml LV fn Echo 01/2018( bubble study)>. PFO with R--> L shunt  07/11/18 states she feels stable, slight improvement in symptoms She has not been moving around so she does not get very short of breath Cramping in her legs is better  07/12/2018-slight bump in BUN/creatinine -discussed with patient-will reduce Lasix to once a day Cough is much better  STUDIES:  Chest x-ray during this admission does show slight worsening of basilar infiltrative process  PAST MEDICAL HISTORY :   has a past medical history of Arthritis, B12 deficiency, Basal cell cancer, Carotid artery occlusion, Cataract, Chronic diastolic CHF (congestive heart failure) ([place]), Colon polyps, COPD (chronic obstructive pulmonary disease) ([place]), Coronary artery disease, Dyspnea, First degree atrioventricular block, History of obesity, Hyperlipidemia, Hypertension, Hyperthyroidism, Lichen sclerosus et atrophicus of the vulva, Osteopenia, PAD (peripheral artery disease) ([place]), and Pulmonary fibrosis ([place]).  has a past surgical history that includes Endarterectomy; Cholecystectomy (1999); Repair tendons foot; Meniscus repair (Left, 10/29/2012); Carotid endarterectomy (Right, 2002); Knee arthroscopy (Left); Cataract  extraction w/ intraocular lens  implant, bilateral; and Open reduction internal fixation (orif) distal radial fracture (Left, 11/08/2016). Prior to Admission medications   Medication Sig Start Date End Date Taking? Authorizing Provider  acetaminophen (TYLENOL) 500 MG tablet Take 1,000 mg by mouth at bedtime.    Yes [provider]  Aloe-Sodium Chloride (AYR SALINE NASAL GEL NA) Place 1 application into the nose daily as needed (dryness).   Yes [provider]  ANORO ELLIPTA 62.5-25 MCG/INH AEPB INHALE 1 PUFF INTO THE LUNGS DAILY Patient taking differently: Inhale 1 puff into the lungs daily.  04/14/18  Yes Rigoberto Noel, MD  aspirin 81 MG tablet Take 81 mg by mouth 2 (two) times daily.    Yes [provider]  Carboxymethylcellul-Glycerin (LUBRICATING EYE DROPS OP) Apply 1 drop to eye daily as needed (dry eyes).   Yes [provider]  docusate sodium (COLACE) 100 MG capsule Take 100 mg by mouth daily.   Yes [provider]  furosemide (LASIX) 20 MG tablet Take 1 tablet (20 mg total) by mouth daily. 02/24/18  Yes [place], Peter M, MD  isosorbide mononitrate (IMDUR) 30 MG 24 hr tablet TAKE 1 TABLET(30 MG) BY MOUTH DAILY 11/21/17  Yes [place], Peter M, MD  levothyroxine (SYNTHROID, LEVOTHROID) 137 MCG tablet Take 137 mcg by mouth daily.     Yes [provider]  metoprolol tartrate (LOPRESSOR) 50 MG tablet Take 1 tablet (50 mg total) by mouth 2 (two) times daily. 01/15/18  Yes [place], Peter M, MD  multivitamin-lutein [facility]) CAPS capsule Take 1 capsule by mouth daily.   Yes [provider]  OXYGEN Inhale 8 L into the lungs daily.    Yes [provider]  Probiotic Product (PROBIOTIC DAILY PO) Take 1 tablet by mouth  at bedtime.    Yes [provider]  rosuvastatin (CRESTOR) 40 MG tablet Take 20 mg by mouth at bedtime.    Yes [provider]  denosumab (PROLIA) 60 MG/ML SOLN injection Inject 60 mg into the skin  every 6 (six) months. Administer in upper arm, thigh, or abdomen    [provider]  nitroGLYCERIN (NITROSTAT) 0.4 MG SL tablet Place 1 tablet (0.4 mg total) under the tongue every 5 (five) minutes as needed for chest pain (MAX 3 doses). 06/01/18   [place], Peter M, MD   Allergies  Allergen Reactions  . Tape Other (See Comments)    Pulls skin    FAMILY HISTORY:  family history includes Breast cancer in her maternal aunt; Diabetes in her sister; Heart attack (age of onset: 59) in her father; Heart disease in her brother; Hypertension in her brother and father. SOCIAL HISTORY:  reports that she quit smoking about 24 years ago. Her smoking use included cigarettes. She has a 40.00 pack-year smoking history. She has never used smokeless tobacco. She reports that she does not drink alcohol or use drugs.  REVIEW OF SYSTEMS:   Constitutional: No fever or chills Respiratory: Cough is better Cardiovascular: Proving leg swelling  neurological: Denies dizziness or any focal weakness.   SUBJECTIVE:  Feels generally better, felt quite well yesterday Still has shortness of breath Cough is better  VITAL SIGNS: Temp:  [97.9 F (36.6 C)-98.5 F (36.9 C)] 98.5 F (36.9 C) (08/25 0453) Pulse Rate:  [66-86] 86 (08/25 1039) Resp:  [16-22] 16 (08/25 0453) BP: (128-154)/(61-71) 128/68 (08/25 0453) SpO2:  [90 %-100 %] 90 % (08/25 0858) Weight:  [64.8 kg] 64.8 kg (08/25 0453)  PHYSICAL EXAMINATION: General: Remains comfortable Neuro: Alert and oriented, moving all extremities HEENT: Moist oral mucosa Cardiovascular: S1-S2 appreciated Lungs: Very few rales at the bases Abdomen: Bowel sounds appreciated Musculoskeletal: Edema improving  skin: Skin is warm and dry  Recent Labs  Lab 07/09/18 0433 07/10/18 0439 07/12/18 0432  NA 144 142 142  K 3.8 3.4* 3.7  CL 105 102 101  CO2 29 31 30   BUN 24* 29* 38*  CREATININE 1.11* 1.02* 1.26*  GLUCOSE 99 102* 103*   Recent Labs  Lab  07/08/18 1256 07/09/18 0433  HGB 12.6 11.9*  HCT 41.0 38.6  WBC 8.9 6.3  PLT 170 146*   ASSESSMENT / PLAN:  .  Acute on chronic hypoxemic respiratory failure  -Continues to require 15 L of oxygen -More comfortable overall  .  Severe pulmonary hypertension -Secondary to diastolic heart failure -There may be a component of chronic hypoxemia contributing from lung disease  .  Diastolic heart failure -Continue to optimize diuretics  .  Possible community-acquired pneumonia -Has been on doxycycline 07/09/2018>> -Plan for 7 days of treatment  COPD exacerbation appears to be stabilizing  Family members at bedside today, discussed improvement in symptoms and discharge planning  Once deemed more stable She will follow-up with obtaining a second opinion  She is well aware that this may be an end-stage process Encouraged to continue along current course I will follow closely  Patient also was able to clarify that tadalafil was stopped primarily because she felt it was not working She did not feel it was responsible for her fall previously  Lasix decreased to 40 mg daily Monitor BUN/creatinine   Pulmonary and [place] Pager: [phone]  07/12/2018, 12:47 PM

## 2018-07-13 ENCOUNTER — Other Ambulatory Visit (HOSPITAL_COMMUNITY): Payer: Medicare Other

## 2018-07-13 ENCOUNTER — Encounter (HOSPITAL_COMMUNITY): Payer: Self-pay | Admitting: *Deleted

## 2018-07-13 ENCOUNTER — Inpatient Hospital Stay (HOSPITAL_COMMUNITY): Payer: Medicare Other

## 2018-07-13 LAB — D-DIMER, QUANTITATIVE: D-Dimer, Quant: 0.44 ug/mL-FEU (ref 0.00–0.50)

## 2018-07-13 LAB — BASIC METABOLIC PANEL
ANION GAP: 9 (ref 5–15)
BUN: 37 mg/dL — ABNORMAL HIGH (ref 8–23)
CHLORIDE: 101 mmol/L (ref 98–111)
CO2: 30 mmol/L (ref 22–32)
Calcium: 9.1 mg/dL (ref 8.9–10.3)
Creatinine, Ser: 1.17 mg/dL — ABNORMAL HIGH (ref 0.44–1.00)
GFR calc non Af Amer: 41 mL/min — ABNORMAL LOW (ref >60)
GFR, EST AFRICAN AMERICAN: 47 mL/min — AB (ref >60)
GLUCOSE: 112 mg/dL — AB (ref 70–99)
Potassium: 4.2 mmol/L (ref 3.5–5.1)
Sodium: 140 mmol/L (ref 135–145)

## 2018-07-13 NOTE — Progress Notes (Addendum)
[name]  RKY:[id number] DOB: August 17, 1929 DOA: 07/08/2018 PCP: [name], MD    Reason for Consult / Chief Complaint:  Acute on chronic respiratory failure   Consulting MD:    HPI/Brief Narrative   84 year old former smoker w/ chronic respiratory failure in setting of COPD and severe pulmonary hypertension w/ right ot left shunt demonstrated on bubble study, polycythemia f/b heme and diastolic HF. Admitted 8/21 w/ acute on chronic hypoxic respiratory failure due to volume overload  Admitted for IV diuresis. 8/22: concerned about possible PNA and component of AECOPD complicating picture. Added Doxy to regimen.  8/23: no sig change. Leg cramping  8/24: breathing more comfortable but still on 15 liters [place] 8/25: lasix reduced d/t BUN/cr bump. Cough better.  8/26: still sob, gained 8 oz. No sig change. Ordered BNP and high resolution CT   Subjective  Feels about the same   Assessment & Plan:  Acute on chronic hypoxic respiratory failure in setting of volume overload from decompensated Cor pulmonale, Pulmonary hypertension and Right to left shunt. Possibly further c/b AECOPD and CAP (NOS).  -no response to Tadalafil; not candidate for other pulmonary vasodilators.  -now 5 liters neg Plan Day 5/7 doxy Cont diuresis  Cont BDs Ddimer( If elevated should consider VQ scan; although not sure she's a great candidate for [facility]) High resolution CT chest.  Will ask hospice to see her.  Will ask CM to see her re: supplies.    Best Practice / Goals of Care / Disposition.   DVT prophylaxis: [place] heparin  GI prophylaxis: na Diet: reg Mobility:OOB Code Status: DNR Family Communication: daughter   Disposition / Summary of Today's Plan 07/13/18   Need to start working on discharge disposition. Getting CT chest, BNP, may need to repeat VQ scan. Looking for anything else we can treat: effusions, thromboembolic disease etc..Marland KitchenThink either way having hospice involved would be helpful. Her only  concern is that she would want to come to hospital if declined.   Consultants: Palliative 8/26  Procedures:   Significant Diagnostic Tests Chest x-ray shows increased interstitial prominence compared to her older films from 2009 HRCT 01/2016 >> no evidence of interstitial lung disease, mild bibasal scarring PFTs 01/2016 -FEV1 1.15 - 64%, no BD response, DLCO 22% Echo2/2017- PAP 64, gr 2 DD, nml LV fn Echo 01/2018( bubble study)>. PFO with R--> L shunt HRCT 8/26>>> BNP 8/26>>>  Micro Data:   Antimicrobials:  Doxy 8/22>>>   Objective    Examination: General: pleasant 84 year old white female. Sitting up in chair. No distress HENT:NCAT no JVD Lungs: decreased bases no accessory use  Cardiovascular: RRR systolic murmur best heard over pulmonic region  Abdomen: soft not tender  Extremities: scattered areas of ecchymosis LE edema  Neuro: awake and alert GU: voids   Blood pressure 138/70, pulse 75, temperature 97.8 F (36.6 C), temperature source Oral, resp. rate 18, height 5\' 3"  (1.6 m), weight 65 kg, SpO2 90 %.        Intake/Output Summary (Last 24 hours) at 07/13/2018 1111 Last data filed at 07/13/2018 0514 Gross per 24 hour  Intake no documentation  Output 1100 ml  Net -1100 ml   Filed Weights   07/11/18 0743 07/12/18 0453 07/13/18 0510  Weight: 64.9 kg 64.8 kg 65 kg     Labs   CBC: Recent Labs  Lab 07/08/18 1256 07/09/18 0433  WBC 8.9 6.3  HGB 12.6 11.9*  HCT 41.0 38.6  MCV 79.8  79.1  PLT 170 161*   Basic Metabolic Panel: Recent Labs  Lab 07/08/18 1256 07/09/18 0433 07/10/18 0439 07/12/18 0432 07/13/18 0414  NA 142 144 142 142 140  K 3.4* 3.8 3.4* 3.7 4.2  CL 104 105 102 101 101  CO2 32 29 31 30 30   GLUCOSE 97 99 102* 103* 112*  BUN 22 24* 29* 38* 37*  CREATININE 0.92 1.11* 1.02* 1.26* 1.17*  CALCIUM 9.0 9.2 9.2 9.1 9.1  MG  --  1.8  --   --   --    GFR: Estimated Creatinine Clearance: 30.7 mL/min (A) (by C-G formula based on SCr of  1.17 mg/dL (H)). Recent Labs  Lab 07/08/18 1256 07/09/18 0433  WBC 8.9 6.3   Liver Function Tests: Recent Labs  Lab 07/08/18 1256  AST 22  ALT 16  ALKPHOS 59  BILITOT 0.8  PROT 6.8  ALBUMIN 3.5   No results for input(s): LIPASE, AMYLASE in the last 168 hours. No results for input(s): AMMONIA in the last 168 hours. ABG    Component Value Date/Time   TCO2 23 05/27/2008 1425    Coagulation Profile: No results for input(s): INR, PROTIME in the last 168 hours. Cardiac Enzymes: No results for input(s): CKTOTAL, CKMB, CKMBINDEX, TROPONINI in the last 168 hours. HbA1C: Hgb A1c MFr Bld  Date/Time Value Ref Range Status  12/28/2016 05:53 AM 5.0 4.8 - 5.6 % Final    Comment:    (NOTE)         Pre-diabetes: 5.7 - 6.4         Diabetes: >6.4         Glycemic control for adults with diabetes: <7.0    CBG: No results for input(s): GLUCAP in the last 168 hours.   Erick Colace ACNP-BC [place] Pager # [phone] OR # [phone] if no answer

## 2018-07-13 NOTE — Care Management Important Message (Signed)
Important Message  Patient Details  Name: [name] MRN: [id number] Date of Birth: 25-Jun-1929   Medicare Important Message Given:  Yes    Kerin Salen 07/13/2018, 12:39 [place] Message  Patient Details  Name: [name] MRN: [id number] Date of Birth: January 30, 1929   Medicare Important Message Given:  Yes    Kerin Salen 07/13/2018, 12:39 PM

## 2018-07-13 NOTE — Progress Notes (Signed)
Pt is refusing bed alarm. Family is at bedside. RN explained the importance of why the bed alarm should be on for safety precautions but since bed alarm has not been on for the last few days patient is still refusing alarm. Will continue to frequently round on and monitor patient. Carmela Hurt, RN

## 2018-07-14 DIAGNOSIS — I2781 Cor pulmonale (chronic): Secondary | ICD-10-CM

## 2018-07-14 MED ORDER — SODIUM CHLORIDE 3 % IN NEBU: 4.0000 mL | INHALATION_SOLUTION | Freq: Every day | RESPIRATORY_TRACT | Status: DC

## 2018-07-14 MED ORDER — SODIUM CHLORIDE 3 % IN NEBU
4.0000 mL | INHALATION_SOLUTION | Freq: Two times a day (BID) | RESPIRATORY_TRACT | Status: DC
  Filled 2018-07-14: qty 4

## 2018-07-14 MED ORDER — SODIUM CHLORIDE 3 % IN NEBU
4.0000 mL | INHALATION_SOLUTION | Freq: Every day | RESPIRATORY_TRACT | Status: DC
  Filled 2018-07-14: qty 4

## 2018-07-14 MED ORDER — SODIUM CHLORIDE 3 % IN NEBU
4.0000 mL | INHALATION_SOLUTION | Freq: Every day | RESPIRATORY_TRACT | Status: DC
  Administered 2018-07-15: 4 mL via RESPIRATORY_TRACT
  Filled 2018-07-14: qty 4

## 2018-07-14 NOTE — Progress Notes (Signed)
[name]  LFY:[id number] DOB: [birth year] DOA: 07/08/2018 PCP: Kelly Infante, MD    Reason for Consult / Chief Complaint:  Acute on chronic respiratory failure   Consulting MD:    HPI/Brief Narrative   84 year old former smoker w/ chronic respiratory failure in setting of COPD and severe pulmonary hypertension w/ right ot left shunt demonstrated on bubble study, polycythemia f/b heme and diastolic HF. Admitted 8/21 w/ acute on chronic hypoxic respiratory failure due to volume overload  Admitted for IV diuresis. 8/22: concerned about possible PNA and component of AECOPD complicating picture. Added Doxy to regimen.  8/23: no sig change. Leg cramping  8/24: breathing more comfortable but still on 15 liters [place] 8/25: lasix reduced d/t BUN/cr bump. Cough better.  8/26: still sob, gained 8 oz. No sig change. Ordered BNP and high resolution CT  8/27: no sig change. Awaiting hospice eval.  Subjective  Feels the same   Assessment & Plan:  Acute on chronic hypoxic respiratory failure in setting of volume overload from decompensated Cor pulmonale, Pulmonary hypertension and Right to left shunt. Possibly further c/b AECOPD and CAP (NOS).  -no response to Tadalafil; not candidate for other pulmonary vasodilators.  -now 6.7 liters neg  -Bubble study and most recent echo reviewed w/ cards./ shunt more Pulm than cardiac so no role for closure. Could actually worsen RV disease if attempts to close were made.  Plan Day 6/7 doxy Cont lasix (will need to adjust home dosing) Cont BDs; adding hypertonic neb w/ metaneb  Working on getting home O2 set up [facility] home 24-48hrs   [employer] / Goals of Care / Disposition.   DVT prophylaxis: [place] heparin  GI prophylaxis: na Diet: reg Mobility:OOB Code Status: DNR Family Communication: daughter   Disposition / Summary of Today's Plan 07/14/18   Very little to offer. No venous thrombus disease. Chronic mucoid plugging noted. Severe airway disease w/  small airway trapping. At this point best we can do is figure how to make this work w/ the cards we have been delt. Will cont lasix, add pulm hygiene w/ hypertonics, await hospice eval and check walking oximetry. Hope home in next 48 hrs if can get all things together.   Consultants: Palliative 8/26  Procedures:   Significant Diagnostic Tests Chest x-ray shows increased interstitial prominence compared to her older films from 2009 HRCT 01/2016 >> no evidence of interstitial lung disease, mild bibasal scarring PFTs 01/2016 -FEV1 1.15 - 64%, no BD response, DLCO 22% Echo2/2017- PAP 64, gr 2 DD, nml LV fn Echo 01/2018( bubble study)>. PFO with R--> L shunt HRCT 8/26>>>no IDL. Mod to severe air trapping. Wide spread 2-54mm pulm nodules scattered t/o ? Chronic mucoid impaction.   Micro Data:   Antimicrobials:  Doxy 8/22>>>   Objective    Examination: General: 84 year old female resting in chair. No distress  HENT:  NCAT no JVD  Lungs: fine crackles in bases Cardiovascular: rrr w/ systolic murmur best heard over pulmonic region  Abdomen: soft not tender  Extremities: warm and dry  Neuro: awake and alert GU: voids   Blood pressure (Abnormal) 153/63, pulse 67, temperature 97.7 F (36.5 C), temperature source Oral, resp. rate 18, height 5\' 3"  (1.6 m), weight 64.9 kg, SpO2 94 %.        Intake/Output Summary (Last 24 hours) at 07/14/2018 1459 Last data filed at 07/14/2018 0533 Gross per 24 hour  Intake no documentation  Output 850 ml  Net -  850 ml   Filed Weights   07/12/18 0453 07/13/18 0510 07/14/18 0531  Weight: 64.8 kg 65 kg 64.9 kg     Labs   CBC: Recent Labs  Lab 07/08/18 1256 07/09/18 0433  WBC 8.9 6.3  HGB 12.6 11.9*  HCT 41.0 38.6  MCV 79.8 79.1  PLT 170 161*   Basic Metabolic Panel: Recent Labs  Lab 07/08/18 1256 07/09/18 0433 07/10/18 0439 07/12/18 0432 07/13/18 0414  NA 142 144 142 142 140  K 3.4* 3.8 3.4* 3.7 4.2  CL 104 105 102 101 101  CO2  32 29 31 30 30   GLUCOSE 97 99 102* 103* 112*  BUN 22 24* 29* 38* 37*  CREATININE 0.92 1.11* 1.02* 1.26* 1.17*  CALCIUM 9.0 9.2 9.2 9.1 9.1  MG  --  1.8  --   --   --    GFR: Estimated Creatinine Clearance: 30.7 mL/min (A) (by C-G formula based on SCr of 1.17 mg/dL (H)). Recent Labs  Lab 07/08/18 1256 07/09/18 0433  WBC 8.9 6.3   Liver Function Tests: Recent Labs  Lab 07/08/18 1256  AST 22  ALT 16  ALKPHOS 59  BILITOT 0.8  PROT 6.8  ALBUMIN 3.5   No results for input(s): LIPASE, AMYLASE in the last 168 hours. No results for input(s): AMMONIA in the last 168 hours. ABG    Component Value Date/Time   TCO2 23 05/27/2008 1425    Coagulation Profile: No results for input(s): INR, PROTIME in the last 168 hours. Cardiac Enzymes: No results for input(s): CKTOTAL, CKMB, CKMBINDEX, TROPONINI in the last 168 hours. HbA1C: Hgb A1c MFr Bld  Date/Time Value Ref Range Status  12/28/2016 05:53 AM 5.0 4.8 - 5.6 % Final    Comment:    (NOTE)         Pre-diabetes: 5.7 - 6.4         Diabetes: >6.4         Glycemic control for adults with diabetes: <7.0    CBG: No results for input(s): GLUCAP in the last 168 hours.   Erick Colace ACNP-BC [place] Pager # [phone] OR # [phone] if no answer

## 2018-07-14 NOTE — Progress Notes (Signed)
Home O2 will only go up to 10L.  Pt is on 15L O2 at present time. Could pt benefit with using an Oxymizer.

## 2018-07-14 NOTE — Progress Notes (Signed)
[id number] [name],BSN,RN3,[phone]/spoke with patient and gave the area home hospice and palliative care providers she would like to use [facility].  CM-Cookie McGibboney notified do not see order for home hospice in the system.

## 2018-07-14 NOTE — Progress Notes (Signed)
Informed from [place] that pt received Oxymizer on June 12, 2018 to her home.

## 2018-07-14 NOTE — Consult Note (Signed)
Cardiology Consultation:   Patient ID: [name]; [id number]; September 17, 1929   Admit date: 07/08/2018 Date of Consult: 07/14/2018  Primary Care Provider: [name], MD Primary Cardiologist: Orvie Caradine [place], MD [place] Primary Electrophysiologist:  None   Patient Profile:   [name] is a 84 y.o. female with a hx of cor pulmonale who is being seen today for the evaluation of hypoxemia at the request of CCM and Salvadore Dom PA.Marland Kitchen  History of Present Illness:   83 y.o. remote history of CAD with PCI RCA in 95', PVD with right CEA, HTN, HLD, Hyperthyroidism and severe  COPD on home oxygen.  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 She has had increasing oxygen requirements since July when she had syncope V/Q with no PE Had worse hypoxemia and shunting on vasodilators seen by Dr Haroldine Laws She has had worsening dyspnea with weight loss and failure to thrive. Non contrast CT with no cancer or ILD. CXR with progression of bibasilar airspace disease atelectasis   Specifically CCM wanted to know if she would be a candidate for PFO closure. I reviewed her bubble study from March And it was negative for cardiac shunting with very few bubbles coming across after 6-8 beats indicating some pulmonary shunting. Further more her TTE done 06/06/18 showed much for RV dilatation and hypokinesis indicating that the issue is more likely low cardiac output and pulmonary shunting and not cardiac shunting   She denies chest pain, fever, mild coughing no recurrent syncope and No palpitations   Past Medical History:  Diagnosis Date  . Arthritis   . B12 deficiency   . Basal cell cancer    Nose  . Carotid artery occlusion   . Cataract   . Chronic diastolic CHF (congestive heart failure) ([place])   . Colon polyps   . COPD (chronic obstructive pulmonary disease) ([place])    STAGE 2  . Coronary artery disease    status post angioplasty of the mid RCA in 1995  . Dyspnea   . First degree atrioventricular  block   . History of obesity   . Hyperlipidemia   . Hypertension    well controlled  . Hyperthyroidism   . Lichen sclerosus et atrophicus of the vulva   . Osteopenia   . PAD (peripheral artery disease) ([facility])    with stable claudication  . Pulmonary fibrosis ([place])    wears 4L home O2 at baseline    Past Surgical History:  Procedure Laterality Date  . CAROTID ENDARTERECTOMY Right 2002  . CATARACT EXTRACTION W/ INTRAOCULAR LENS  IMPLANT, BILATERAL    . CHOLECYSTECTOMY  1999  . ENDARTERECTOMY     right carotid endarterectomy  . KNEE ARTHROSCOPY Left   . MENISCUS REPAIR Left 10/29/2012  . OPEN REDUCTION INTERNAL FIXATION (ORIF) DISTAL RADIAL FRACTURE Left 11/08/2016   Procedure: OPEN TREATMENT OF LEFT DISTAL RADIUS FRACTURE;  Surgeon: Milly Jakob, MD;  Location: [place] [place];  Service: Orthopedics;  Laterality: Left;  . REPAIR TENDONS FOOT       Home Medications:  Prior to Admission medications   Medication Sig Start Date End Date Taking? Authorizing Provider  acetaminophen (TYLENOL) 500 MG tablet Take 1,000 mg by mouth at bedtime.    Yes [provider]  Aloe-Sodium Chloride (AYR SALINE NASAL GEL NA) Place 1 application into the nose daily as needed (dryness).   Yes [provider]  ANORO ELLIPTA 62.5-25 MCG/INH AEPB INHALE 1 PUFF INTO THE LUNGS DAILY Patient taking  differently: Inhale 1 puff into the lungs daily.  04/14/18  Yes Rigoberto Noel, MD  aspirin 81 MG tablet Take 81 mg by mouth 2 (two) times daily.    Yes [provider]  Carboxymethylcellul-Glycerin (LUBRICATING EYE DROPS OP) Apply 1 drop to eye daily as needed (dry eyes).   Yes [provider]  docusate sodium (COLACE) 100 MG capsule Take 100 mg by mouth daily.   Yes [provider]  furosemide (LASIX) 20 MG tablet Take 1 tablet (20 mg total) by mouth daily. 02/24/18  Yes [place], Keyanah Kozicki M, MD  isosorbide mononitrate (IMDUR) 30 MG 24 hr tablet TAKE 1 TABLET(30 MG) BY MOUTH DAILY  11/21/17  Yes [place], Deshanae Lindo M, MD  levothyroxine (SYNTHROID, LEVOTHROID) 137 MCG tablet Take 137 mcg by mouth daily.     Yes [provider]  metoprolol tartrate (LOPRESSOR) 50 MG tablet Take 1 tablet (50 mg total) by mouth 2 (two) times daily. 01/15/18  Yes [place], Elyssa Pendelton M, MD  multivitamin-lutein [facility]) CAPS capsule Take 1 capsule by mouth daily.   Yes [provider]  OXYGEN Inhale 8 L into the lungs daily.    Yes [provider]  Probiotic Product (PROBIOTIC DAILY PO) Take 1 tablet by mouth at bedtime.    Yes [provider]  rosuvastatin (CRESTOR) 40 MG tablet Take 20 mg by mouth at bedtime.    Yes [provider]  denosumab (PROLIA) 60 MG/ML SOLN injection Inject 60 mg into the skin every 6 (six) months. Administer in upper arm, thigh, or abdomen    [provider]  nitroGLYCERIN (NITROSTAT) 0.4 MG SL tablet Place 1 tablet (0.4 mg total) under the tongue every 5 (five) minutes as needed for chest pain (MAX 3 doses). 06/01/18   Martin[place], Paylin Hailu M, MD    Inpatient Medications: Scheduled Meds: . acidophilus  1 capsule Oral QHS  . aspirin EC  81 mg Oral BID  . docusate sodium  100 mg Oral Daily  . doxycycline  100 mg Oral Q12H  . furosemide  40 mg Oral Daily  . guaiFENesin  600 mg Oral BID  . heparin  5,000 Units Subcutaneous Q8H  . isosorbide mononitrate  30 mg Oral Daily  . levothyroxine  137 mcg Oral QAC breakfast  . metoprolol tartrate  50 mg Oral BID  . multivitamin  1 tablet Oral Daily  . potassium chloride  20 mEq Oral Daily  . rosuvastatin  20 mg Oral QHS  . sodium chloride HYPERTONIC  4 mL Nebulization Q12H  . umeclidinium-vilanterol  1 puff Inhalation Daily   Continuous Infusions: . sodium chloride     PRN Meds: sodium chloride, acetaminophen, ipratropium-albuterol, ondansetron (ZOFRAN) IV, polyvinyl alcohol, sodium chloride  Allergies:    Allergies  Allergen Reactions  . Tape Other (See Comments)    Pulls  skin    Social History:   Social History   Socioeconomic History  . Marital status: Widowed    Spouse name: Not on file  . Number of children: 3  . Years of education: Not on file  . Highest education level: Not on file  Occupational History  . 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 Financial resource strain: Not hard at all  . Food insecurity:    Worry: Never true    Inability: Never true  . Transportation needs:    Medical: No    Non-medical: No  Tobacco Use  . Smoking status: Former Smoker  Packs/day: 1.00    Years: 40.00    Pack years: 40.00    Types: Cigarettes    Last attempt to quit: 11/18/1993    Years since quitting: 24.6  . Smokeless tobacco: Never Used  Substance and Sexual Activity  . Alcohol use: No    Alcohol/week: 0.0 standard drinks  . Drug use: No  . Sexual activity: Not Currently  Lifestyle  . Physical activity:    Days per week: 4 days    Minutes per session: 20 min  . Stress: Not at all  Relationships  . Social connections:    Talks on phone: More than three times a week    Gets together: Twice a week    Attends religious service: More than 4 times per year    Active member of club or organization: Yes    Attends meetings of clubs or organizations: Never    Relationship status: Patient refused  . Intimate partner violence:    Fear of current or ex partner: No    Emotionally abused: No    Physically abused: No    Forced sexual activity: No  Other Topics Concern  . Not on file  Social History Narrative   Patient states he has friends and family that he contacts often.    Family History:    Family History  Problem Relation Age of Onset  . Heart attack Father 25  . Hypertension Father   . Heart disease Brother        underwent coronary bypass grafting at age 104  . Hypertension Brother   . Diabetes Sister   . Breast cancer Maternal Aunt      ROS:  Please see the history of present illness.   All other ROS  reviewed and negative.     Physical Exam/Data:   Vitals:   07/13/18 1413 07/13/18 2023 07/14/18 0531 07/14/18 1356  BP: 122/63 (!) 152/64  (!) 153/63  Pulse: 66 74  67  Resp: 20 18  18   Temp: 97.8 F (36.6 C) 97.7 F (36.5 C)    TempSrc: Oral Oral    SpO2: 96% 92%  94%  Weight:   64.9 kg   Height:        Intake/Output Summary (Last 24 hours) at 07/14/2018 1521 Last data filed at 07/14/2018 0533 Gross per 24 hour  Intake -  Output 850 ml  Net -850 ml   Filed Weights   07/12/18 0453 07/13/18 0510 07/14/18 0531  Weight: 64.8 kg 65 kg 64.9 kg   Body mass index is 25.33 kg/m.  Affect appropriate Chronically ill thin white female on high flow oxygen  HEENT: normal Neck supple with no adenopathy JVP normal no bruits no thyromegaly right CEA  Lungs diminished BS exp wheezing and good diaphragmatic motion Heart:  S2 prominent RV lift  Abdomen: benighn, BS positve, no tenderness, no AAA no bruit.  No HSM or HJR Distal pulses intact with no bruits No edema Neuro non-focal Skin warm and dry No muscular weakness   EKG:  The EKG was personally reviewed and demonstrates:  SR rate 66 nonspecific ST changes  Telemetry:  Telemetry was personally reviewed and demonstrates:  NSR 07/14/2018   Relevant CV Studies: SEE HPI for TTE reports 02/14/23 and July of this year   Laboratory Data:  Chemistry Recent Labs  Lab 07/10/18 0439 07/12/18 0432 07/13/18 0414  NA 142 142 140  K 3.4* 3.7 4.2  CL 102 101 101  CO2 31 30 30  GLUCOSE 102* 103* 112*  BUN 29* 38* 37*  CREATININE 1.02* 1.26* 1.17*  CALCIUM 9.2 9.1 9.1  GFRNONAA 48* 37* 41*  GFRAA 56* 43* 47*  ANIONGAP 9 11 9     Recent Labs  Lab 07/08/18 1256  PROT 6.8  ALBUMIN 3.5  AST 22  ALT 16  ALKPHOS 59  BILITOT 0.8   Hematology Recent Labs  Lab 07/08/18 1256 07/09/18 0433  WBC 8.9 6.3  RBC 5.14* 4.88  HGB 12.6 11.9*  HCT 41.0 38.6  MCV 79.8 79.1  MCH 24.5* 24.4*  MCHC 30.7 30.8  RDW 15.1 15.1  PLT 170  146*   Cardiac EnzymesNo results for input(s): TROPONINI in the last 168 hours. No results for input(s): TROPIPOC in the last 168 hours.  BNPNo results for input(s): BNP, PROBNP in the last 168 hours.  DDimer  Recent Labs  Lab 07/13/18 1249  DDIMER 0.44    Radiology/Studies:  Ct Chest High Resolution  Result Date: 07/13/2018 CLINICAL DATA:  84 year old female with history of 15 pound weight loss over the past 2 months. Bilateral leg swelling for the past 3 weeks. Shortness of breath on home oxygen. EXAM: CT CHEST WITHOUT CONTRAST TECHNIQUE: Multidetector CT imaging of the chest was performed following the standard protocol without intravenous contrast. High resolution imaging of the lungs, as well as inspiratory and expiratory imaging, was performed. COMPARISON:  High-resolution chest CT 02/14/2016. FINDINGS: Cardiovascular: Heart size is mildly enlarged with right atrial dilatation. There is no significant pericardial fluid, thickening or pericardial calcification. There is aortic atherosclerosis, as well as atherosclerosis of the great vessels of the mediastinum and the coronary arteries, including calcified atherosclerotic plaque in the left main, left anterior descending, left circumflex and right coronary arteries. Mild calcifications of the aortic valve. Severe calcifications of the mitral annulus. Mediastinum/Nodes: No pathologically enlarged mediastinal are hilar lymph nodes. Please note that accurate exclusion of hilar adenopathy is limited on noncontrast CT scans. Densely calcified right hilar and subcarinal lymph nodes. Esophagus is unremarkable in appearance. No axillary lymphadenopathy. Lungs/Pleura: There multiple tiny 2-4 mm pulmonary nodules scattered throughout the lungs bilaterally, similar to the prior examinations, favored to reflect areas of chronic mucoid impaction within terminal bronchioles. No larger more suspicious appearing pulmonary nodules or masses are noted. Calcified  granuloma in the right lower lobe incidentally noted. No acute consolidative airspace disease. No pleural effusions. High-resolution images demonstrate no significant regions of ground-glass attenuation, subpleural reticulation, parenchymal banding, traction bronchiectasis or frank honeycombing. Inspiratory and expiratory imaging demonstrates moderate to severe air trapping indicative of small airways disease. Upper Abdomen: Status post cholecystectomy. Numerous tiny calcified granulomas are noted throughout the liver and spleen. Musculoskeletal: Nondisplaced fractures of the anterolateral aspect of the right third through fifth ribs, new compared to the prior examination. New compression fracture of superior endplate of L1 vertebral body with 30% loss of anterior vertebral body height. No surrounding paravertebral soft tissue swelling to suggest an acute injury. There are no aggressive appearing lytic or blastic lesions noted in the visualized portions of the skeleton. IMPRESSION: 1. No findings to suggest interstitial lung disease. 2. Moderate to severe air trapping indicative of small airways disease. 3. Widespread 2-4 mm pulmonary nodules scattered throughout the periphery of the lungs bilaterally, similar to prior examinations, compatible with a benign etiology, presumably areas of chronic mucoid impaction within terminal bronchioles. 4. Aortic atherosclerosis, in addition to left main and 3 vessel coronary artery disease. 5. There are calcifications of the aortic valve and mitral annulus.  Echocardiographic correlation for evaluation of potential valvular dysfunction may be warranted if clinically indicated. 6. Cardiomegaly with right atrial dilatation 7. Old granulomatous disease, as above. Aortic Atherosclerosis (ICD10-I70.0). Electronically Signed   By: Vinnie Langton M.D.   On: 07/13/2018 14:44    Assessment and Plan:   1. Cor-pulmonale: with hypoxemia requiring high flow oxygen. Unfortunately this  is not a situation were the patient has a large intra cardiac shunt that can be closed to improve oxygenation In the last 4 months her RV appears more dilated and hypokinetic and this suggests issue is RV failure and lower output. She has failed vasodilators earlier this year when tried by DB with worsening oxygenation and shunting. There is no indication for PFO closure  2. CAD:  Stable no angina continue medical Rx 3. HLD:  On statin  4. Thyroid:  On replacement TSH normal   Unfortunately I suspect she will evolve into hospice care the next 6 months    For questions or updates, please contact [place] Please consult [email or web address] for contact info under Cardiology/STEMI.   Signed, Jenkins Rouge, MD  07/14/2018 3:21 PM

## 2018-07-14 NOTE — Progress Notes (Addendum)
Palliative Medicine RN Note: Consult order noted. We will not have a provider available to see Ms Lindenberger until likely tomorrow afternoon. I see that Marni Griffon ordered home hospice yesterday around noon via [facility] and SW orders. Called RNCM & spoke with covering staff; they will have Cookie call me tomorrow about this.   We will see Ms Faddis asap tomorrow afternoon.  Marjie Skiff Lynnley Doddridge, RN, BSN, [facility] Palliative Medicine Team 07/14/2018 3:41 PM Office [phone]   ADDENDUM: Rec'd call from Velva Harman, who just spoke with family. She will make referral to [facility] at family request.  Marjie Skiff. Candid Bovey, RN, BSN, [facility] Palliative Medicine Team 07/14/2018 3:48 PM Office [phone]

## 2018-07-15 DIAGNOSIS — J9621 Acute and chronic respiratory failure with hypoxia: Secondary | ICD-10-CM

## 2018-07-15 MED ORDER — GUAIFENESIN ER 600 MG PO TB12: 600.0000 mg | ORAL_TABLET | Freq: Two times a day (BID) | ORAL | Status: AC

## 2018-07-15 MED ORDER — POTASSIUM CHLORIDE CRYS ER 20 MEQ PO TBCR: 20.0000 meq | EXTENDED_RELEASE_TABLET | Freq: Every day | ORAL | 6 refills | Status: AC

## 2018-07-15 MED ORDER — SODIUM CHLORIDE 3 % IN NEBU: 4.0000 mL | INHALATION_SOLUTION | Freq: Every day | RESPIRATORY_TRACT | 12 refills | Status: AC

## 2018-07-15 MED ORDER — FUROSEMIDE 40 MG PO TABS: 40.0000 mg | ORAL_TABLET | Freq: Every day | ORAL | 6 refills | Status: AC

## 2018-07-15 NOTE — Progress Notes (Signed)
Referral called to [facility] [place].

## 2018-07-15 NOTE — Progress Notes (Signed)
Pt discharged to home with daughter. Pt has home oxygen and [facility] is on their way to switch out one of her cylinder for a larger cylinder. Instructions reviewed with pt and daughter both acknowledged understanding. SRP, RN

## 2018-07-15 NOTE — Discharge Summary (Signed)
Physician Discharge Summary         Patient ID: [name] MRN: [id number] DOB/AGE: 01/04/1929 85 y.o.  Admit date: 07/08/2018 Discharge date: 07/15/2018  Discharge Diagnoses:    Acute on Chronic Hypoxic respiratory failure  Decompensated cor pulmonale and right-sided heart failure Pulmonary hypertension Right to left cardiopulmonary shunt Acute exacerbation chronic obstructive pulmonary disease Possible community-acquired pneumonia (no organism specified)  Discharge summary   84 year old former smoker w/ chronic respiratory failure in setting of COPD and severe pulmonary hypertension w/ right ot left shunt demonstrated on bubble study, polycythemia f/b heme and diastolic HF. Admitted 8/21 w/ acute on chronic hypoxic respiratory failure due to volume overload  Admitted for IV diuresis. 8/22: concerned about possible PNA and component of AECOPD complicating picture. Added Doxy to regimen.  8/23: no sig change. Leg cramping  8/24: breathing more comfortable but still on 15 liters [place] 8/25: lasix reduced d/t BUN/cr bump. Cough better.  8/26: still sob, gained 8 oz. No sig change. Ordered BNP and high resolution CT. that showed no interstitial lung disease.  There is moderate to severe air trapping.  There is also widespread 2 to 4 mm pulmonary nodules thought to be probable mucoid impaction. 8/27: no sig change. Awaiting hospice eval.  ambulating in room.  Tolerating activity on a 15 L.  Started hypertonic saline via nebulizer. Seen by cardiology re: PFO closure.  We reviewed the echocardiograms from March and July 2019.  The initial study in March 2019 showed indication of pulmonary shunting with very few bubbles crossing at 68 beats.  Interestingly a follow-up study was done in July 2019, this demonstrated much worsening right ventricular dilation and hypokinesis.  This indicated right ventricular dysfunction was a much bigger deal at this point furthermore attempting to close PFO may  worsening anger or exacerbate her RV dysfunction.  She was deemed not a candidate for PFO closure. 8/28: Completed antibiotics.  Feels ready for discharge to home we will discharge her to home with the regimen as outlined below   Discharge Plan by Active Problems    Acute on chronic hypoxic respiratory failure in setting of volume overload from decompensated Cor pulmonale, Pulmonary hypertension and Right to left shunt. Possibly further c/b AECOPD and CAP (NOS).  -no response to Tadalafil; not candidate for other pulmonary vasodilators.  -now 6.7 liters neg  -Bubble study and most recent echo reviewed w/ cards./ shunt more Pulm than cardiac so no role for closure. Could actually worsen RV disease if attempts to close were made.  -completed abx (7 days Doxy) Plan Home on current lasix dosing Resume Home BDs But adding daily HT saline nebulized Oxygen 15 liters  F/u our office 1 week   [facility] tests/ studies   Chest x-ray shows increased interstitial prominence compared to her older films from 2009 HRCT 01/2016 >> no evidence of interstitial lung disease, mild bibasal scarring PFTs 01/2016 -FEV1 1.15 - 64%, no BD response, DLCO 22% Echo2/2017- PAP 64, gr 2 DD, nml LV fn Echo 01/2018( bubble study)>. PFO with R--> L shunt HRCT 8/26>>>no IDL. Mod to severe air trapping. Wide spread 2-1mm pulm nodules scattered t/o ? Chronic mucoid impaction.   Culture data/antimicrobials      Consults    none  Discharge Exam: Blood Pressure (Abnormal) 151/63 (BP Location: Right Arm)   Pulse 68   Temperature (Abnormal) 97.5 F (36.4 C) (Oral)   Respiration 16   Height 5\' 3"  (1.6 m)   Weight 64.5  kg   Oxygen Saturation 94%   Body Mass Index 25.17 kg/m    Labs at discharge   Lab Results  Component Value Date   CREATININE 1.17 (H) 07/13/2018   BUN 37 (H) 07/13/2018   NA 140 07/13/2018   K 4.2 07/13/2018   CL 101 07/13/2018   CO2 30 07/13/2018   Lab Results  Component  Value Date   WBC 6.3 07/09/2018   HGB 11.9 (L) 07/09/2018   HCT 38.6 07/09/2018   MCV 79.1 07/09/2018   PLT 146 (L) 07/09/2018   Lab Results  Component Value Date   ALT 16 07/08/2018   AST 22 07/08/2018   ALKPHOS 59 07/08/2018   BILITOT 0.8 07/08/2018   Lab Results  Component Value Date   INR 1.08 06/24/2018    Current radiological studies    Ct Chest High Resolution  Result Date: 07/13/2018 CLINICAL DATA:  84 year old female with history of 15 pound weight loss over the past 2 months. Bilateral leg swelling for the past 3 weeks. Shortness of breath on home oxygen. EXAM: CT CHEST WITHOUT CONTRAST TECHNIQUE: Multidetector CT imaging of the chest was performed following the standard protocol without intravenous contrast. High resolution imaging of the lungs, as well as inspiratory and expiratory imaging, was performed. COMPARISON:  High-resolution chest CT 02/14/2016. FINDINGS: Cardiovascular: Heart size is mildly enlarged with right atrial dilatation. There is no significant pericardial fluid, thickening or pericardial calcification. There is aortic atherosclerosis, as well as atherosclerosis of the great vessels of the mediastinum and the coronary arteries, including calcified atherosclerotic plaque in the left main, left anterior descending, left circumflex and right coronary arteries. Mild calcifications of the aortic valve. Severe calcifications of the mitral annulus. Mediastinum/Nodes: No pathologically enlarged mediastinal are hilar lymph nodes. Please note that accurate exclusion of hilar adenopathy is limited on noncontrast CT scans. Densely calcified right hilar and subcarinal lymph nodes. Esophagus is unremarkable in appearance. No axillary lymphadenopathy. Lungs/Pleura: There multiple tiny 2-4 mm pulmonary nodules scattered throughout the lungs bilaterally, similar to the prior examinations, favored to reflect areas of chronic mucoid impaction within terminal bronchioles. No larger  more suspicious appearing pulmonary nodules or masses are noted. Calcified granuloma in the right lower lobe incidentally noted. No acute consolidative airspace disease. No pleural effusions. High-resolution images demonstrate no significant regions of ground-glass attenuation, subpleural reticulation, parenchymal banding, traction bronchiectasis or frank honeycombing. Inspiratory and expiratory imaging demonstrates moderate to severe air trapping indicative of small airways disease. Upper Abdomen: Status post cholecystectomy. Numerous tiny calcified granulomas are noted throughout the liver and spleen. Musculoskeletal: Nondisplaced fractures of the anterolateral aspect of the right third through fifth ribs, new compared to the prior examination. New compression fracture of superior endplate of L1 vertebral body with 30% loss of anterior vertebral body height. No surrounding paravertebral soft tissue swelling to suggest an acute injury. There are no aggressive appearing lytic or blastic lesions noted in the visualized portions of the skeleton. IMPRESSION: 1. No findings to suggest interstitial lung disease. 2. Moderate to severe air trapping indicative of small airways disease. 3. Widespread 2-4 mm pulmonary nodules scattered throughout the periphery of the lungs bilaterally, similar to prior examinations, compatible with a benign etiology, presumably areas of chronic mucoid impaction within terminal bronchioles. 4. Aortic atherosclerosis, in addition to left main and 3 vessel coronary artery disease. 5. There are calcifications of the aortic valve and mitral annulus. Echocardiographic correlation for evaluation of potential valvular dysfunction may be warranted if clinically indicated. 6. Cardiomegaly  with right atrial dilatation 7. Old granulomatous disease, as above. Aortic Atherosclerosis (ICD10-I70.0). Electronically Signed   By: Vinnie Langton M.D.   On: 07/13/2018 14:44    Disposition:    Discharge  disposition: 01-Home or Self Care       Discharge Instructions    Diet - low sodium heart healthy   Complete by:  As directed    Discharge instructions   Complete by:  As directed    Use your Anoro first, then after about 1/2 hr use the nebulized hypertonic saline followed by flutter valve   For home use only DME oxygen   Complete by:  As directed    Needs 2 concentrators able to provide 15 liters total   Mode or (Route):  Nasal cannula   Liters per Minute:  15   Frequency:  Continuous (stationary and portable oxygen unit needed)   Oxygen conserving device:  No   Oxygen delivery system:  Gas   Increase activity slowly   Complete by:  As directed      Allergies as of 07/15/2018    Allergen Reactions Comment   Tape Other (See Comments) Pulls skin      Medication List    Stop taking these medications   OXYGEN     Take these medications   acetaminophen 500 MG tablet Commonly known as:  TYLENOL Take 1,000 mg by mouth at bedtime.   ANORO ELLIPTA 62.5-25 MCG/INH Aepb Generic drug:  umeclidinium-vilanterol INHALE 1 PUFF INTO THE LUNGS DAILY What changed:  See the new instructions.   aspirin 81 MG tablet Take 81 mg by mouth 2 (two) times daily.   AYR SALINE NASAL GEL NA Place 1 application into the nose daily as needed (dryness).   denosumab 60 MG/ML Soln injection Commonly known as:  PROLIA Inject 60 mg into the skin every 6 (six) months. Administer in upper arm, thigh, or abdomen   docusate sodium 100 MG capsule Commonly known as:  COLACE Take 100 mg by mouth daily.   furosemide 40 MG tablet Commonly known as:  LASIX Take 1 tablet (40 mg total) by mouth daily. Start taking on:  07/16/2018 What changed:    medication strength  how much to take   guaiFENesin 600 MG 12 hr tablet Commonly known as:  MUCINEX Take 1 tablet (600 mg total) by mouth 2 (two) times daily.   isosorbide mononitrate 30 MG 24 hr tablet Commonly known as:  IMDUR TAKE 1 TABLET(30 MG)  BY MOUTH DAILY   levothyroxine 137 MCG tablet Commonly known as:  SYNTHROID, LEVOTHROID Take 137 mcg by mouth daily.   LUBRICATING EYE DROPS OP Apply 1 drop to eye daily as needed (dry eyes).   metoprolol tartrate 50 MG tablet Commonly known as:  LOPRESSOR Take 1 tablet (50 mg total) by mouth 2 (two) times daily.   multivitamin-lutein Caps capsule Take 1 capsule by mouth daily.   nitroGLYCERIN 0.4 MG SL tablet Commonly known as:  NITROSTAT Place 1 tablet (0.4 mg total) under the tongue every 5 (five) minutes as needed for chest pain (MAX 3 doses).   potassium chloride SA 20 MEQ tablet Commonly known as:  K-DUR,KLOR-CON Take 1 tablet (20 mEq total) by mouth daily. Start taking on:  07/16/2018   PROBIOTIC DAILY PO Take 1 tablet by mouth at bedtime.   rosuvastatin 40 MG tablet Commonly known as:  CRESTOR Take 20 mg by mouth at bedtime.   sodium chloride HYPERTONIC 3 % nebulizer solution Take 4  mLs by nebulization daily. Start taking on:  07/16/2018        Durable Medical Equipment  (From admission, onward)         Start     Ordered   07/15/18 1118  For home use only DME Nebulizer machine  Once    Question:  Patient needs a nebulizer to treat with the following condition  Answer:  COPD (chronic obstructive pulmonary disease) ([place])   07/15/18 1117   07/15/18 0000  For home use only DME oxygen    Comments:  Needs 2 concentrators able to provide 15 liters total  Question Answer Comment  Mode or (Route) Nasal cannula   Liters per Minute 15   Frequency Continuous (stationary and portable oxygen unit needed)   Oxygen conserving device No   Oxygen delivery system Gas      07/15/18 1127   Unscheduled  DME tub bench  Once     07/15/18 1127   Unscheduled  DME Oxygen  Once    Question Answer Comment  Mode or (Route) Nasal cannula   Liters per Minute 15   Frequency Continuous (stationary and portable oxygen unit needed)   Oxygen conserving device No   Oxygen delivery  system Gas      07/15/18 1127           Discharge Condition:    fair  Physician Statement:   The Patient was personally examined, the discharge assessment and plan has been personally reviewed and I agree with ACNP Elva Breaker's assessment and plan. 34 minutes of time have been dedicated to discharge assessment, planning and discharge instructions.   Signed: Clementeen Graham 07/15/2018, 11:27 AM

## 2018-07-15 NOTE — Progress Notes (Signed)
Pt plan to go home home with [place] and Home O2. Pt would like a second medical opinion.

## 2018-07-17 DIAGNOSIS — J9621 Acute and chronic respiratory failure with hypoxia: Secondary | ICD-10-CM | POA: Diagnosis not present

## 2018-07-17 DIAGNOSIS — J449 Chronic obstructive pulmonary disease, unspecified: Secondary | ICD-10-CM | POA: Diagnosis not present

## 2018-07-17 DIAGNOSIS — I2721 Secondary pulmonary arterial hypertension: Secondary | ICD-10-CM | POA: Diagnosis not present

## 2018-07-17 DIAGNOSIS — I2781 Cor pulmonale (chronic): Secondary | ICD-10-CM | POA: Diagnosis not present

## 2018-07-20 DIAGNOSIS — Z87891 Personal history of nicotine dependence: Secondary | ICD-10-CM | POA: Diagnosis not present

## 2018-07-20 DIAGNOSIS — I2510 Atherosclerotic heart disease of native coronary artery without angina pectoris: Secondary | ICD-10-CM | POA: Diagnosis present

## 2018-07-20 DIAGNOSIS — J150 Pneumonia due to Klebsiella pneumoniae: Secondary | ICD-10-CM | POA: Diagnosis not present

## 2018-07-20 DIAGNOSIS — I5032 Chronic diastolic (congestive) heart failure: Secondary | ICD-10-CM | POA: Diagnosis not present

## 2018-07-20 DIAGNOSIS — Z7982 Long term (current) use of aspirin: Secondary | ICD-10-CM | POA: Diagnosis not present

## 2018-07-20 DIAGNOSIS — I272 Pulmonary hypertension, unspecified: Secondary | ICD-10-CM | POA: Diagnosis not present

## 2018-07-20 DIAGNOSIS — I5033 Acute on chronic diastolic (congestive) heart failure: Secondary | ICD-10-CM | POA: Diagnosis present

## 2018-07-20 DIAGNOSIS — J449 Chronic obstructive pulmonary disease, unspecified: Secondary | ICD-10-CM | POA: Diagnosis not present

## 2018-07-20 DIAGNOSIS — J810 Acute pulmonary edema: Secondary | ICD-10-CM | POA: Diagnosis not present

## 2018-07-20 DIAGNOSIS — I517 Cardiomegaly: Secondary | ICD-10-CM | POA: Diagnosis not present

## 2018-07-20 DIAGNOSIS — R0902 Hypoxemia: Secondary | ICD-10-CM | POA: Diagnosis not present

## 2018-07-20 DIAGNOSIS — R0600 Dyspnea, unspecified: Secondary | ICD-10-CM | POA: Diagnosis not present

## 2018-07-20 DIAGNOSIS — J9611 Chronic respiratory failure with hypoxia: Secondary | ICD-10-CM | POA: Diagnosis not present

## 2018-07-20 DIAGNOSIS — I5023 Acute on chronic systolic (congestive) heart failure: Secondary | ICD-10-CM | POA: Diagnosis not present

## 2018-07-20 DIAGNOSIS — N179 Acute kidney failure, unspecified: Secondary | ICD-10-CM | POA: Diagnosis present

## 2018-07-20 DIAGNOSIS — I110 Hypertensive heart disease with heart failure: Secondary | ICD-10-CM | POA: Diagnosis present

## 2018-07-20 DIAGNOSIS — I10 Essential (primary) hypertension: Secondary | ICD-10-CM | POA: Diagnosis not present

## 2018-07-20 DIAGNOSIS — J9612 Chronic respiratory failure with hypercapnia: Secondary | ICD-10-CM | POA: Diagnosis not present

## 2018-07-20 DIAGNOSIS — R918 Other nonspecific abnormal finding of lung field: Secondary | ICD-10-CM | POA: Diagnosis not present

## 2018-07-20 DIAGNOSIS — Z955 Presence of coronary angioplasty implant and graft: Secondary | ICD-10-CM | POA: Diagnosis not present

## 2018-07-20 DIAGNOSIS — E440 Moderate protein-calorie malnutrition: Secondary | ICD-10-CM | POA: Diagnosis present

## 2018-07-20 DIAGNOSIS — M1990 Unspecified osteoarthritis, unspecified site: Secondary | ICD-10-CM | POA: Diagnosis present

## 2018-07-20 DIAGNOSIS — J440 Chronic obstructive pulmonary disease with acute lower respiratory infection: Secondary | ICD-10-CM | POA: Diagnosis not present

## 2018-07-20 DIAGNOSIS — I4430 Unspecified atrioventricular block: Secondary | ICD-10-CM | POA: Diagnosis not present

## 2018-07-20 DIAGNOSIS — J9601 Acute respiratory failure with hypoxia: Secondary | ICD-10-CM | POA: Diagnosis not present

## 2018-07-20 DIAGNOSIS — E039 Hypothyroidism, unspecified: Secondary | ICD-10-CM | POA: Diagnosis present

## 2018-07-20 DIAGNOSIS — E785 Hyperlipidemia, unspecified: Secondary | ICD-10-CM | POA: Diagnosis present

## 2018-07-20 DIAGNOSIS — I499 Cardiac arrhythmia, unspecified: Secondary | ICD-10-CM | POA: Diagnosis not present

## 2018-07-20 DIAGNOSIS — Z66 Do not resuscitate: Secondary | ICD-10-CM | POA: Diagnosis present

## 2018-07-20 DIAGNOSIS — I70219 Atherosclerosis of native arteries of extremities with intermittent claudication, unspecified extremity: Secondary | ICD-10-CM | POA: Diagnosis present

## 2018-07-20 DIAGNOSIS — Z6824 Body mass index (BMI) 24.0-24.9, adult: Secondary | ICD-10-CM | POA: Diagnosis not present

## 2018-07-20 DIAGNOSIS — Z01810 Encounter for preprocedural cardiovascular examination: Secondary | ICD-10-CM | POA: Diagnosis not present

## 2018-07-20 DIAGNOSIS — J90 Pleural effusion, not elsewhere classified: Secondary | ICD-10-CM | POA: Diagnosis not present

## 2018-07-20 DIAGNOSIS — R931 Abnormal findings on diagnostic imaging of heart and coronary circulation: Secondary | ICD-10-CM | POA: Diagnosis not present

## 2018-07-20 DIAGNOSIS — Z9981 Dependence on supplemental oxygen: Secondary | ICD-10-CM | POA: Diagnosis not present

## 2018-07-20 DIAGNOSIS — J9621 Acute and chronic respiratory failure with hypoxia: Secondary | ICD-10-CM | POA: Diagnosis present

## 2018-07-22 ENCOUNTER — Inpatient Hospital Stay: Payer: Medicare Other | Admitting: Pulmonary Disease

## 2018-07-25 NOTE — Telephone Encounter (Signed)
Ok to refer per PT request

## 2018-07-27 NOTE — Telephone Encounter (Signed)
Spoke with pt's daughter, Almyra Free. She is aware of RA's response. Referral has been placed. Nothing further is needed at this time.

## 2018-08-08 DIAGNOSIS — I509 Heart failure, unspecified: Secondary | ICD-10-CM | POA: Diagnosis not present

## 2018-08-08 DIAGNOSIS — Z515 Encounter for palliative care: Secondary | ICD-10-CM | POA: Diagnosis not present

## 2018-08-08 DIAGNOSIS — R159 Full incontinence of feces: Secondary | ICD-10-CM | POA: Diagnosis not present

## 2018-08-08 DIAGNOSIS — Z66 Do not resuscitate: Secondary | ICD-10-CM | POA: Diagnosis not present

## 2018-08-08 DIAGNOSIS — J441 Chronic obstructive pulmonary disease with (acute) exacerbation: Secondary | ICD-10-CM | POA: Diagnosis not present

## 2018-08-08 DIAGNOSIS — I10 Essential (primary) hypertension: Secondary | ICD-10-CM | POA: Diagnosis not present

## 2018-08-08 DIAGNOSIS — I270 Primary pulmonary hypertension: Secondary | ICD-10-CM | POA: Diagnosis not present

## 2018-08-08 DIAGNOSIS — R32 Unspecified urinary incontinence: Secondary | ICD-10-CM | POA: Diagnosis not present

## 2018-08-09 DIAGNOSIS — J441 Chronic obstructive pulmonary disease with (acute) exacerbation: Secondary | ICD-10-CM | POA: Diagnosis not present

## 2018-08-09 DIAGNOSIS — I270 Primary pulmonary hypertension: Secondary | ICD-10-CM | POA: Diagnosis not present

## 2018-08-09 DIAGNOSIS — R159 Full incontinence of feces: Secondary | ICD-10-CM | POA: Diagnosis not present

## 2018-08-09 DIAGNOSIS — R32 Unspecified urinary incontinence: Secondary | ICD-10-CM | POA: Diagnosis not present

## 2018-08-09 DIAGNOSIS — I509 Heart failure, unspecified: Secondary | ICD-10-CM | POA: Diagnosis not present

## 2018-08-09 DIAGNOSIS — I10 Essential (primary) hypertension: Secondary | ICD-10-CM | POA: Diagnosis not present

## 2018-08-18 DEATH — deceased

## 2020-01-25 IMAGING — NM NM PULMONARY VENT & PERF
16 series · 16 of 16 positions shown · non-contrast
Comparison: May 15, 2018

CLINICAL DATA: Pulmonary hypertension.

EXAM:
NUCLEAR MEDICINE VENTILATION - PERFUSION LUNG SCAN
TECHNIQUE: Ventilation images were obtained in multiple projections using
inhaled aerosol Mc-99m DTPA. Perfusion images were obtained in
multiple projections after intravenous injection of [id number].
RADIOPHARMACEUTICALS:  31.7 mCi of Mc-99m DTPA aerosol inhalation
and 4.2 mCi [id number] IV

[Series 1: ant/post vent · 4.14mm/px · 1 of 1 slices shown (1 of 2)]
[im 1/1]
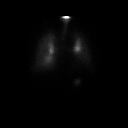

[Series 1: ant/post vent · 4.14mm/px · 1 of 1 slices shown (2 of 2)]
[im 1/1]
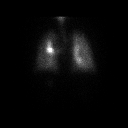

[Series 2: lao/rpo vent · 4.14mm/px · 1 of 1 slices shown (1 of 2)]
[im 1/1]
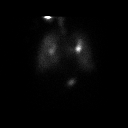

[Series 2: lao/rpo vent · 4.14mm/px · 1 of 1 slices shown (2 of 2)]
[im 1/1]
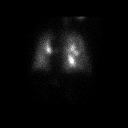

[Series 3: lpo/rao vent · 4.14mm/px · 1 of 1 slices shown (1 of 2)]
[im 1/1]
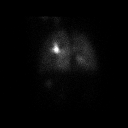

[Series 3: lpo/rao vent · 4.14mm/px · 1 of 1 slices shown (2 of 2)]
[im 1/1]
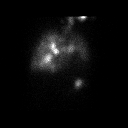

[Series 4: lt lat/rt lat vent · 4.14mm/px · 1 of 1 slices shown (1 of 2)]
[im 1/1]
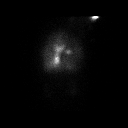

[Series 4: lt lat/rt lat vent · 4.14mm/px · 1 of 1 slices shown (2 of 2)]
[im 1/1]
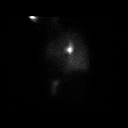

[Series 5: lt lat/rt lat perf · 4.14mm/px · 1 of 1 slices shown (1 of 2)]
[im 1/1]
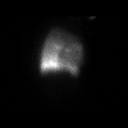

[Series 5: lt lat/rt lat perf · 4.14mm/px · 1 of 1 slices shown (2 of 2)]
[im 1/1]
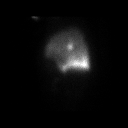

[Series 6: lpo/rao perf · 4.14mm/px · 1 of 1 slices shown (1 of 2)]
[im 1/1]
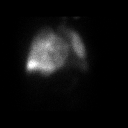

[Series 6: lpo/rao perf · 4.14mm/px · 1 of 1 slices shown (2 of 2)]
[im 1/1]
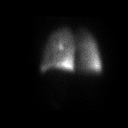

[Series 7: ant/post perf · 4.14mm/px · 1 of 1 slices shown (1 of 2)]
[im 1/1]
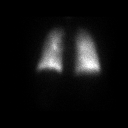

[Series 7: ant/post perf · 4.14mm/px · 1 of 1 slices shown (2 of 2)]
[im 1/1]
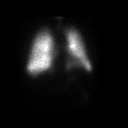

[Series 8: lao/rpo perf · 4.14mm/px · 1 of 1 slices shown (1 of 2)]
[im 1/1]
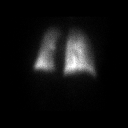

[Series 8: lao/rpo perf · 4.14mm/px · 1 of 1 slices shown (2 of 2)]
[im 1/1]
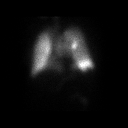

[16 of 16 positions shown; findings below may reference images not displayed]

FINDINGS: Ventilation: No unmatched defects. Minimally heterogeneous
ventilation.

Perfusion: No unmatched defects. Minimally heterogeneous
ventilation.
IMPRESSION: No evidence of acute or chronic pulmonary emboli on today's study.
Very low probability V/Q scan.
